# Patient Record
Sex: Male | Born: 1975 | Race: White | Hispanic: No | Marital: Single | Smoking: Former smoker
Health system: Southern US, Community
[De-identification: ages and names within clinical notes are randomized; demographics above are authoritative.]

## PROBLEM LIST (undated history)

## (undated) DIAGNOSIS — Z800 Family history of malignant neoplasm of digestive organs: Secondary | ICD-10-CM

## (undated) DIAGNOSIS — K219 Gastro-esophageal reflux disease without esophagitis: Secondary | ICD-10-CM

## (undated) DIAGNOSIS — E66811 Obesity, class 1: Secondary | ICD-10-CM

## (undated) DIAGNOSIS — C73 Malignant neoplasm of thyroid gland: Secondary | ICD-10-CM

## (undated) DIAGNOSIS — K760 Fatty (change of) liver, not elsewhere classified: Secondary | ICD-10-CM

## (undated) DIAGNOSIS — J189 Pneumonia, unspecified organism: Secondary | ICD-10-CM

## (undated) DIAGNOSIS — I10 Essential (primary) hypertension: Secondary | ICD-10-CM

## (undated) DIAGNOSIS — C8190 Hodgkin lymphoma, unspecified, unspecified site: Secondary | ICD-10-CM

## (undated) DIAGNOSIS — F32A Depression, unspecified: Secondary | ICD-10-CM

## (undated) DIAGNOSIS — Z8719 Personal history of other diseases of the digestive system: Secondary | ICD-10-CM

## (undated) DIAGNOSIS — R7303 Prediabetes: Secondary | ICD-10-CM

## (undated) DIAGNOSIS — E785 Hyperlipidemia, unspecified: Secondary | ICD-10-CM

## (undated) DIAGNOSIS — K759 Inflammatory liver disease, unspecified: Secondary | ICD-10-CM

## (undated) DIAGNOSIS — I509 Heart failure, unspecified: Secondary | ICD-10-CM

## (undated) DIAGNOSIS — F1910 Other psychoactive substance abuse, uncomplicated: Secondary | ICD-10-CM

## (undated) DIAGNOSIS — Z8619 Personal history of other infectious and parasitic diseases: Secondary | ICD-10-CM

## (undated) DIAGNOSIS — Z8489 Family history of other specified conditions: Secondary | ICD-10-CM

## (undated) DIAGNOSIS — E291 Testicular hypofunction: Secondary | ICD-10-CM

## (undated) DIAGNOSIS — J8410 Pulmonary fibrosis, unspecified: Secondary | ICD-10-CM

## (undated) DIAGNOSIS — E890 Postprocedural hypothyroidism: Secondary | ICD-10-CM

## (undated) DIAGNOSIS — M1990 Unspecified osteoarthritis, unspecified site: Secondary | ICD-10-CM

## (undated) DIAGNOSIS — R5382 Chronic fatigue, unspecified: Secondary | ICD-10-CM

## (undated) DIAGNOSIS — I639 Cerebral infarction, unspecified: Secondary | ICD-10-CM

## (undated) DIAGNOSIS — C4491 Basal cell carcinoma of skin, unspecified: Secondary | ICD-10-CM

## (undated) DIAGNOSIS — G4700 Insomnia, unspecified: Secondary | ICD-10-CM

## (undated) DIAGNOSIS — K921 Melena: Secondary | ICD-10-CM

## (undated) DIAGNOSIS — D649 Anemia, unspecified: Secondary | ICD-10-CM

## (undated) DIAGNOSIS — F419 Anxiety disorder, unspecified: Secondary | ICD-10-CM

## (undated) DIAGNOSIS — J4540 Moderate persistent asthma, uncomplicated: Secondary | ICD-10-CM

## (undated) DIAGNOSIS — G4730 Sleep apnea, unspecified: Secondary | ICD-10-CM

## (undated) DIAGNOSIS — Z8673 Personal history of transient ischemic attack (TIA), and cerebral infarction without residual deficits: Secondary | ICD-10-CM

## (undated) DIAGNOSIS — E669 Obesity, unspecified: Secondary | ICD-10-CM

## (undated) DIAGNOSIS — J45909 Unspecified asthma, uncomplicated: Secondary | ICD-10-CM

## (undated) DIAGNOSIS — K922 Gastrointestinal hemorrhage, unspecified: Secondary | ICD-10-CM

## (undated) DIAGNOSIS — I491 Atrial premature depolarization: Secondary | ICD-10-CM

## (undated) DIAGNOSIS — Z9081 Acquired absence of spleen: Secondary | ICD-10-CM

## (undated) DIAGNOSIS — F319 Bipolar disorder, unspecified: Secondary | ICD-10-CM

## (undated) DIAGNOSIS — Z8571 Personal history of Hodgkin lymphoma: Secondary | ICD-10-CM

## (undated) DIAGNOSIS — K2270 Barrett's esophagus without dysplasia: Secondary | ICD-10-CM

## (undated) HISTORY — DX: Prediabetes: R73.03

## (undated) HISTORY — DX: Obesity, class 1: E66.811

## (undated) HISTORY — DX: Melena: K92.1

## (undated) HISTORY — PX: OTHER SURGICAL HISTORY: SHX169

## (undated) HISTORY — PX: SPLENECTOMY: SUR1306

## (undated) HISTORY — DX: Gastro-esophageal reflux disease without esophagitis: K21.9

## (undated) HISTORY — DX: Personal history of other infectious and parasitic diseases: Z86.19

## (undated) HISTORY — DX: Fatty (change of) liver, not elsewhere classified: K76.0

## (undated) HISTORY — DX: Obesity, unspecified: E66.9

## (undated) HISTORY — DX: Family history of malignant neoplasm of digestive organs: Z80.0

## (undated) HISTORY — DX: Cerebral infarction, unspecified: I63.9

## (undated) HISTORY — PX: THYROIDECTOMY: SHX17

## (undated) HISTORY — DX: Testicular hypofunction: E29.1

## (undated) HISTORY — DX: Basal cell carcinoma of skin, unspecified: C44.91

## (undated) HISTORY — DX: Personal history of transient ischemic attack (TIA), and cerebral infarction without residual deficits: Z86.73

## (undated) HISTORY — DX: Moderate persistent asthma, uncomplicated: J45.40

## (undated) HISTORY — DX: Postprocedural hypothyroidism: E89.0

## (undated) HISTORY — DX: Hyperlipidemia, unspecified: E78.5

## (undated) HISTORY — DX: Chronic fatigue, unspecified: R53.82

## (undated) HISTORY — DX: Personal history of Hodgkin lymphoma: Z85.71

## (undated) HISTORY — DX: Insomnia, unspecified: G47.00

## (undated) HISTORY — DX: Acquired absence of spleen: Z90.81

## (undated) HISTORY — DX: Hodgkin lymphoma, unspecified, unspecified site: C81.90

## (undated) HISTORY — PX: PARATHYROIDECTOMY: SHX19

---

## 2008-10-11 DIAGNOSIS — C73 Malignant neoplasm of thyroid gland: Secondary | ICD-10-CM

## 2008-10-11 HISTORY — DX: Malignant neoplasm of thyroid gland: C73

## 2012-10-09 ENCOUNTER — Encounter (HOSPITAL_COMMUNITY): Payer: Self-pay | Admitting: *Deleted

## 2012-10-09 ENCOUNTER — Emergency Department (HOSPITAL_COMMUNITY)
Admission: EM | Admit: 2012-10-09 | Discharge: 2012-10-11 | Disposition: A | Discharge status: Home / Self Care | Payer: Self-pay | Attending: Emergency Medicine | Admitting: Emergency Medicine

## 2012-10-09 DIAGNOSIS — Z85850 Personal history of malignant neoplasm of thyroid: Secondary | ICD-10-CM | POA: Insufficient documentation

## 2012-10-09 DIAGNOSIS — K2270 Barrett's esophagus without dysplasia: Secondary | ICD-10-CM | POA: Insufficient documentation

## 2012-10-09 DIAGNOSIS — F1110 Opioid abuse, uncomplicated: Secondary | ICD-10-CM | POA: Insufficient documentation

## 2012-10-09 DIAGNOSIS — Z79899 Other long term (current) drug therapy: Secondary | ICD-10-CM | POA: Insufficient documentation

## 2012-10-09 DIAGNOSIS — F1010 Alcohol abuse, uncomplicated: Secondary | ICD-10-CM | POA: Insufficient documentation

## 2012-10-09 DIAGNOSIS — F17200 Nicotine dependence, unspecified, uncomplicated: Secondary | ICD-10-CM | POA: Insufficient documentation

## 2012-10-09 DIAGNOSIS — Z8709 Personal history of other diseases of the respiratory system: Secondary | ICD-10-CM | POA: Insufficient documentation

## 2012-10-09 DIAGNOSIS — I10 Essential (primary) hypertension: Secondary | ICD-10-CM | POA: Insufficient documentation

## 2012-10-09 DIAGNOSIS — J45909 Unspecified asthma, uncomplicated: Secondary | ICD-10-CM | POA: Insufficient documentation

## 2012-10-09 HISTORY — DX: Barrett's esophagus without dysplasia: K22.70

## 2012-10-09 HISTORY — DX: Pulmonary fibrosis, unspecified: J84.10

## 2012-10-09 HISTORY — DX: Other psychoactive substance abuse, uncomplicated: F19.10

## 2012-10-09 HISTORY — DX: Malignant neoplasm of thyroid gland: C73

## 2012-10-09 HISTORY — DX: Essential (primary) hypertension: I10

## 2012-10-09 HISTORY — DX: Gastrointestinal hemorrhage, unspecified: K92.2

## 2012-10-09 HISTORY — DX: Hodgkin lymphoma, unspecified, unspecified site: C81.90

## 2012-10-09 HISTORY — DX: Unspecified asthma, uncomplicated: J45.909

## 2012-10-09 LAB — CBC WITH DIFFERENTIAL/PLATELET
Basophils Absolute: 0.1 10*3/uL (ref 0.0–0.1)
HCT: 43.5 % (ref 39.0–52.0)
Lymphocytes Relative: 19 % (ref 12–46)
Monocytes Absolute: 0.9 10*3/uL (ref 0.1–1.0)
Neutro Abs: 4.8 10*3/uL (ref 1.7–7.7)
RBC: 5.18 MIL/uL (ref 4.22–5.81)
RDW: 17.1 % — ABNORMAL HIGH (ref 11.5–15.5)
WBC: 8.7 10*3/uL (ref 4.0–10.5)

## 2012-10-09 LAB — RAPID URINE DRUG SCREEN, HOSP PERFORMED
Barbiturates: NOT DETECTED
Tetrahydrocannabinol: POSITIVE — AB

## 2012-10-09 LAB — COMPREHENSIVE METABOLIC PANEL
ALT: 40 U/L (ref 0–53)
AST: 33 U/L (ref 0–37)
CO2: 24 mEq/L (ref 19–32)
Chloride: 98 mEq/L (ref 96–112)
Creatinine, Ser: 1.27 mg/dL (ref 0.50–1.35)
GFR calc non Af Amer: 72 mL/min — ABNORMAL LOW (ref >90)
Sodium: 136 mEq/L (ref 135–145)
Total Bilirubin: 0.4 mg/dL (ref 0.3–1.2)

## 2012-10-09 MED ORDER — ZOLPIDEM TARTRATE 10 MG PO TABS
10.0000 mg | ORAL_TABLET | Freq: Every evening | ORAL | Status: DC | PRN
  Administered 2012-10-10: 10 mg via ORAL
  Filled 2012-10-09: qty 1

## 2012-10-09 MED ORDER — MOMETASONE FURO-FORMOTEROL FUM 100-5 MCG/ACT IN AERO
2.0000 | INHALATION_SPRAY | Freq: Two times a day (BID) | RESPIRATORY_TRACT | Status: DC
  Administered 2012-10-10 – 2012-10-11 (×3): 2 via RESPIRATORY_TRACT
  Filled 2012-10-09: qty 8.8

## 2012-10-09 MED ORDER — BUPROPION HCL ER (XL) 300 MG PO TB24
300.0000 mg | ORAL_TABLET | Freq: Every day | ORAL | Status: DC
  Administered 2012-10-10 – 2012-10-11 (×2): 300 mg via ORAL
  Filled 2012-10-09 (×2): qty 1

## 2012-10-09 MED ORDER — LORAZEPAM 1 MG PO TABS
1.0000 mg | ORAL_TABLET | Freq: Three times a day (TID) | ORAL | Status: DC | PRN
  Administered 2012-10-10 – 2012-10-11 (×2): 1 mg via ORAL
  Filled 2012-10-09 (×2): qty 1

## 2012-10-09 MED ORDER — LISINOPRIL 20 MG PO TABS
20.0000 mg | ORAL_TABLET | Freq: Every day | ORAL | Status: DC
  Administered 2012-10-10 – 2012-10-11 (×2): 20 mg via ORAL
  Filled 2012-10-09 (×2): qty 1

## 2012-10-09 MED ORDER — PANTOPRAZOLE SODIUM 40 MG PO TBEC
40.0000 mg | DELAYED_RELEASE_TABLET | Freq: Every day | ORAL | Status: DC
  Administered 2012-10-10 – 2012-10-11 (×2): 40 mg via ORAL
  Filled 2012-10-09 (×2): qty 1

## 2012-10-09 MED ORDER — NICOTINE 21 MG/24HR TD PT24
21.0000 mg | MEDICATED_PATCH | Freq: Once | TRANSDERMAL | Status: AC
  Administered 2012-10-09: 21 mg via TRANSDERMAL
  Filled 2012-10-09 (×3): qty 1

## 2012-10-09 MED ORDER — LEVOTHYROXINE SODIUM 112 MCG PO TABS
224.0000 ug | ORAL_TABLET | Freq: Every day | ORAL | Status: DC
  Administered 2012-10-10 – 2012-10-11 (×2): 224 ug via ORAL
  Filled 2012-10-09 (×4): qty 2

## 2012-10-09 MED ORDER — ALBUTEROL SULFATE HFA 108 (90 BASE) MCG/ACT IN AERS: 2.0000 | INHALATION_SPRAY | Freq: Four times a day (QID) | RESPIRATORY_TRACT | Status: DC | PRN

## 2012-10-09 MED ORDER — FAMOTIDINE 20 MG PO TABS
10.0000 mg | ORAL_TABLET | Freq: Every day | ORAL | Status: DC
  Administered 2012-10-10: 10 mg via ORAL
  Administered 2012-10-11: 20 mg via ORAL
  Filled 2012-10-09: qty 1

## 2012-10-09 NOTE — ED Notes (Signed)
D: Patient with dull, flat affect endorsing depression. Pt states he is here for heroin detox. A: Monitor patient Q 15 minutes for safety, administer medications as ordered by MD. R: Pt denies SI or plans to harm himself at this time.

## 2012-10-09 NOTE — ED Notes (Signed)
Pt family will be taking belongings home.

## 2012-10-09 NOTE — ED Notes (Signed)
Pt was not able to urinate at this time 

## 2012-10-09 NOTE — ED Notes (Signed)
Pt requesting treatment for heroin and alcohol abuse; denies si/hi; last use 1630 today; no previous history of same; states has had suicidal thoughts at times but would never act on them; not actively suicidal at this time

## 2012-10-10 LAB — ACETAMINOPHEN LEVEL: Acetaminophen (Tylenol), Serum: <15 ug/mL (ref 10–30)

## 2012-10-10 MED ORDER — ONDANSETRON 4 MG PO TBDP
4.0000 mg | ORAL_TABLET | Freq: Three times a day (TID) | ORAL | Status: DC | PRN
  Administered 2012-10-10: 4 mg via ORAL
  Filled 2012-10-10: qty 1

## 2012-10-10 MED ORDER — NAPROXEN 500 MG PO TABS
500.0000 mg | ORAL_TABLET | Freq: Once | ORAL | Status: DC
  Filled 2012-10-10: qty 1

## 2012-10-10 MED ORDER — NICOTINE 21 MG/24HR TD PT24
21.0000 mg | MEDICATED_PATCH | Freq: Once | TRANSDERMAL | Status: DC
  Administered 2012-10-10: 21 mg via TRANSDERMAL

## 2012-10-10 MED ORDER — CLONIDINE HCL 0.1 MG PO TABS
0.2000 mg | ORAL_TABLET | Freq: Two times a day (BID) | ORAL | Status: DC
  Administered 2012-10-10 – 2012-10-11 (×2): 0.2 mg via ORAL
  Filled 2012-10-10 (×2): qty 2

## 2012-10-10 NOTE — ED Provider Notes (Signed)
History     CSN: [id number]  Arrival date & time 10/09/12  2024   First MD Initiated Contact with Patient 10/09/12 2217      No chief complaint on file.   (Consider location/radiation/quality/duration/timing/severity/associated sxs/prior treatment) HPI Comments: This is a 36 year old male, who presents emergency department with chief complaint of wanting to detox from heroin and alcohol. Patient denies SI/HI. States that the last time he used heroin was today at 4:30 PM. He does not have any complaints at this time. He has an extensive medical history which can be seen on the chart. Patient denies headache, blurred vision, new hearing loss, sore throat, chest pain, shortness of breath, nausea, vomiting, diarrhea, constipation, dysuria, peripheral edema, back pain, numbness or tingling of the extremities.   The history is provided by the patient. No language interpreter was used.    Past Medical History  Diagnosis Date  . Hodgkin disease   . Thyroid cancer   . Barrett's esophagus   . GI bleed   . Pulmonary fibrosis   . Hypertension   . Drug abuse   . Asthma     Past Surgical History  Procedure Date  . Spleenectomy   . Thyroidectomy   . Parathyroidectomy     No family history on file.  History  Substance Use Topics  . Smoking status: Current Some Day Smoker    Types: Cigarettes  . Smokeless tobacco: Not on file  . Alcohol Use: Yes      Review of Systems  All other systems reviewed and are negative.    Allergies  Review of patient's allergies indicates no known allergies.  Home Medications   Current Outpatient Rx  Name  Route  Sig  Dispense  Refill  . ALBUTEROL SULFATE HFA 108 (90 BASE) MCG/ACT IN AERS   Inhalation   Inhale 2 puffs into the lungs every 6 (six) hours as needed. For shortness of breath.         . BUPROPION HCL ER (XL) 300 MG PO TB24   Oral   Take 300 mg by mouth daily.         Marland Kitchen FLUTICASONE-SALMETEROL 250-50 MCG/DOSE IN AEPB  Inhalation   Inhale 1 puff into the lungs every 12 (twelve) hours.         Marland Kitchen LEVOTHYROXINE SODIUM 112 MCG PO TABS   Oral   Take 224 mcg by mouth daily.         Marland Kitchen LISINOPRIL 20 MG PO TABS   Oral   Take 20 mg by mouth daily.         Marland Kitchen MELATONIN PO   Oral   Take 1 capsule by mouth at bedtime.         Maxwell Caul BICARBONATE 20-1100 MG PO CAPS   Oral   Take 1 capsule by mouth daily before breakfast.         . RANITIDINE HCL 300 MG PO TABS   Oral   Take 300 mg by mouth at bedtime.         Marland Kitchen ZOLPIDEM TARTRATE 10 MG PO TABS   Oral   Take 10 mg by mouth at bedtime as needed. For sleep.           BP 132/76  Pulse 87  Temp 98.5 F (36.9 C) (Oral)  Resp 22  SpO2 98%  Physical Exam  Nursing note and vitals reviewed. Constitutional: He is oriented to person, place, and time. He appears well-developed and well-nourished.  HENT:  Head: Normocephalic and atraumatic.  Eyes: Conjunctivae normal and EOM are normal. Pupils are equal, round, and reactive to light.  Neck: Normal range of motion. Neck supple.  Cardiovascular: Normal rate, regular rhythm and intact distal pulses.  Exam reveals no gallop.   No murmur heard. Pulmonary/Chest: Effort normal and breath sounds normal. No respiratory distress. He has no wheezes. He has no rales. He exhibits no tenderness.  Abdominal: Soft. Bowel sounds are normal. He exhibits no distension and no mass. There is no tenderness. There is no rebound and no guarding.  Musculoskeletal: Normal range of motion. He exhibits no edema and no tenderness.  Neurological: He is alert and oriented to person, place, and time.  Skin: Skin is warm and dry. No rash noted.  Psychiatric: His behavior is normal. Judgment and thought content normal.       Somewhat withdrawn    ED Course  Procedures (including critical care time)  Labs Reviewed  CBC WITH DIFFERENTIAL - Abnormal; Notable for the following:    RDW 17.1 (*)     Eosinophils  Relative 14 (*)     Eosinophils Absolute 1.2 (*)     All other components within normal limits  COMPREHENSIVE METABOLIC PANEL - Abnormal; Notable for the following:    Glucose, Bld 108 (*)     GFR calc non Af Amer 72 (*)     GFR calc Af Amer 84 (*)     All other components within normal limits  URINE RAPID DRUG SCREEN (HOSP PERFORMED) - Abnormal; Notable for the following:    Opiates POSITIVE (*)     Tetrahydrocannabinol POSITIVE (*)     All other components within normal limits  ETHANOL  SALICYLATE LEVEL  ACETAMINOPHEN LEVEL   No results found.   No diagnosis found.    MDM  Patient is here for heroin and alcohol detox. I placed psych temp orders, and moved to the back.        Roxy Horseman, PA-C 10/10/12 [phone]

## 2012-10-10 NOTE — BHH Counselor (Signed)
Patient referred to [facility]. Per [place], beds are available.

## 2012-10-10 NOTE — BH Assessment (Signed)
Assessment Note   Troy [name] is an 36 y.o. male. Patient presents to the ED requesting detox from Alcohol, Heroin, Benzo's (Klonopin and Xanax), Cocaine, and THC. Patient's ET OH is negative and his BAL is positive for THC and opiates only. Patient using Alcohol daily for over a yr and drinks 3-4 beers to a fifth or more of liquor. He has used heroin daily for the past yr; $60-100 per use. He uses Benzo's "whenever accessible" which is typically 3-4x's per week; 1 gram per use. Patient using Cocaine 4-5x's per week over a quarter gram per use. He also smokes marijuana approx. 1 gram daily and has done so for several years. Patient last used all substances 10/10/2012 with the exception of cocaine and benzo's he used 2-3 days ago.  He reports several withdrawal symptoms: nausea, cold sweats, tremors, runny nose, cramps, etc. 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 Patient reports mild symptoms of depression feeling hopeless/helpless. Patient denies SI/HI/ AVH's. No history of inpatient or outpatient treatment.  He does not have any other complaints at this time.   Axis I: Polysubstance Abuse Axis II: Deferred Axis III:  Past Medical History  Diagnosis Date  . Hodgkin disease   . Thyroid cancer   . Barrett's esophagus   . GI bleed   . Pulmonary fibrosis   . Hypertension   . Drug abuse   . Asthma    Axis IV: housing problems, occupational problems, other psychosocial or environmental problems, problems related to social environment and problems with primary support group Axis V: 41-50 serious symptoms  Past Medical History:  Past Medical History  Diagnosis Date  . Hodgkin disease   . Thyroid cancer   . Barrett's esophagus   . GI bleed   . Pulmonary fibrosis   . Hypertension   . Drug abuse   . Asthma     Past Surgical History  Procedure Date   . Spleenectomy   . Thyroidectomy   . Parathyroidectomy     Family History: No family history on file.  Social History:  reports that he has been smoking Cigarettes.  He does not have any smokeless tobacco history on file. He reports that he drinks alcohol. He reports that he uses illicit drugs (Marijuana).  Additional Social History:  Alcohol / Drug Use Pain Medications: SEE MAR Prescriptions: SEE MAR Over the Counter: SEE MAR History of alcohol / drug use?: Yes Substance #1 Name of Substance 1: Alcohol  1 - Age of First Use: 36 yrs old 1 - Amount (size/oz): "varies"; "3-4 beers or over a 5th of liqour" 1 - Frequency: daily  1 - Duration: "A little over a year" 1 - Last Use / Amount: 10/09/12; "couple of years" Substance #2 Name of Substance 2: Heroin 2 - Age of First Use: "I can't remember" 2 - Amount (size/oz): $60-$100 worth of drugs 2 - Frequency: daily 2 - Duration: "couple of yrs" 2 - Last Use / Amount: 10/09/12; "I used alot...not sure exactly how much" Substance #3 Name of Substance 3: THC 3 - Age of First Use: 36 yrs old 3 - Amount (size/oz): 1 gram  or more 3 - Frequency: daily  3 - Duration: "yrs" 3 - Last Use / Amount: 10/09/12; unk amt Substance #4 Name of Substance 4: Cocaine 4 - Age of First Use: 36  yrs old 4 - Amount (size/oz): "A quarter of a gram" 4 - Frequency: daily  4 - Duration: on-going  4 - Last Use / Amount: "couple of days ago" Substance #5 Name of Substance 5: Benzo's (Xanax and Klonopin" 5 - Age of First Use: 36 yrs old 5 - Amount (size/oz): 1 gram per use 5 - Frequency: daily  5 - Duration: 1 yr 5 - Last Use / Amount: 2 days ago  CIWA: CIWA-Ar BP: 139/90 mmHg Pulse Rate: 86  COWS:    Allergies: No Known Allergies  Home Medications:  (Not in a hospital admission)  OB/GYN Status:  No LMP for male patient.  General Assessment Data Location of Assessment: [facility] Living Arrangements: Other (Comment);Parent (pt evicted from  parents home today) Can pt return to current living arrangement?: No Admission Status: Voluntary Is patient capable of signing voluntary admission?: Yes Transfer from: Acute Hospital Referral Source: Self/Family/Friend  Education Status Is patient currently in school?: No  Risk to self Suicidal Ideation: No Suicidal Intent: No Is patient at risk for suicide?: No Suicidal Plan?: No Access to Means: No What has been your use of drugs/alcohol within the last 12 months?:  (n/a) Previous Attempts/Gestures: Yes (1x (overdose)) How many times?:  (1 prior attempt during teens) Other Self Harm Risks:  (n/a) Triggers for Past Attempts:  ("pt unable to recall trigger") Intentional Self Injurious Behavior: None Family Suicide History: No Recent stressful life event(s): Other (Comment);Loss (Comment);Job Loss;Financial Problems (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 wks ago; evicted-homeless) Persecutory voices/beliefs?: No Depression: Yes Depression Symptoms: Feeling worthless/self pity Substance abuse history and/or treatment for substance abuse?: Yes Suicide prevention information given to non-admitted patients: Not applicable  Risk to Others Homicidal Ideation: No Thoughts of Harm to Others: No Current Homicidal Intent: No Current Homicidal Plan: No Access to Homicidal Means: No Identified Victim:  (n/a) History of harm to others?: No Assessment of Violence: None Noted Violent Behavior Description:  (pt is calm and cooperative during the assessment) Does patient have access to weapons?: No Criminal Charges Pending?: No Does patient have a court date: No  Psychosis Hallucinations: None noted Delusions: None noted  Mental Status Report Appear/Hygiene: Disheveled Eye Contact: Poor Motor Activity: Freedom of movement Speech: Logical/coherent Level of Consciousness: Alert Mood: Sad;Depressed Affect: Sad Anxiety Level: None Thought Processes: Coherent Judgement:  Unimpaired Orientation: Person;Place;Time;Situation Obsessive Compulsive Thoughts/Behaviors: None  Cognitive Functioning Concentration: Normal Memory: Remote Intact;Recent Intact IQ: Average Insight: Fair Impulse Control: Fair Appetite: Poor Weight Loss:  (35 pounds in 2-3 months) Weight Gain:  (none reported) Sleep: Decreased Total Hours of Sleep:  ("a few hours per night") Vegetative Symptoms: None  ADLScreening [facility] Assessment Services) Patient's cognitive ability adequate to safely complete daily activities?: Yes Patient able to express need for assistance with ADLs?: Yes Independently performs ADLs?: Yes (appropriate for developmental age)  Abuse/Neglect [facility]) Physical Abuse: Denies Verbal Abuse: Denies Sexual Abuse: Denies  Prior Inpatient Therapy Prior Inpatient Therapy: No Prior Therapy Dates:  (n/a) Prior Therapy Facilty/Provider(s):  (n/a) Reason for Treatment:  (n/a)  Prior Outpatient Therapy Prior Outpatient Therapy: No Prior Therapy Dates:  (n/a) Prior Therapy Facilty/Provider(s):  (n/a) Reason for Treatment:  (n/a)  ADL Screening (condition at time of admission) Patient's cognitive ability adequate to safely complete daily activities?: Yes Patient able to express need for assistance with ADLs?: Yes Independently performs ADLs?: Yes (appropriate for developmental age) Weakness of Legs: None Weakness of Arms/Hands: None  Home Assistive Devices/Equipment Home Assistive Devices/Equipment: None  Abuse/Neglect Assessment (Assessment to be complete while patient is alone) Physical Abuse: Denies Verbal Abuse: Denies Sexual Abuse: Denies Exploitation of patient/patient's resources: Denies Self-Neglect: Denies Values / Beliefs Cultural Requests During Hospitalization: None Spiritual Requests During Hospitalization: None   Advance Directives (For Healthcare) Advance Directive: Patient does not have advance directive Nutrition Screen- [facility]  Adult/[facility] Patient's home diet: Regular  Additional Information 1:1 In Past 12 Months?: No CIRT Risk: No Elopement Risk: No Does patient have medical clearance?: Yes     Disposition:  Disposition Disposition of Patient: Inpatient treatment program;Referred to ([facility]) Type of inpatient treatment program: Adult Patient referred to: [facility]  On Site Evaluation by:   Reviewed with Physician:     Melynda Ripple [facility] 10/10/2012 12:33 PM

## 2012-10-10 NOTE — ED Notes (Signed)
D: Patient asleep with no s/s of distress noted. A: Q 15 minute safety checks maintained. R: No change in patient's status at this time.

## 2012-10-10 NOTE — ED Provider Notes (Signed)
Medical screening examination/treatment/procedure(s) were performed by non-physician practitioner and as supervising physician I was immediately available for consultation/collaboration.   Gerhard Munch, MD 10/10/12 1700

## 2012-10-10 NOTE — ED Notes (Addendum)
Pt is unhappy with placement in the [facility] area and states that if he doesn't get moved tomorrow, then he will leave.  Pt also requesting ambien.

## 2012-10-11 NOTE — ED Provider Notes (Signed)
Patient doesn't want to go to detox center. He has been here for 45 hrs. Feels better now. Vitals stable. Will give him a list of detox places and have him f/u outpatient.   Richardean Canal, MD 10/11/12 [phone]

## 2013-12-10 ENCOUNTER — Emergency Department: Payer: Self-pay | Admitting: Emergency Medicine

## 2013-12-10 LAB — COMPREHENSIVE METABOLIC PANEL
ALK PHOS: 47 U/L
ALT: 32 U/L (ref 12–78)
Albumin: 3.5 g/dL (ref 3.4–5.0)
Anion Gap: 4 — ABNORMAL LOW (ref 7–16)
BUN: 15 mg/dL (ref 7–18)
Bilirubin,Total: 0.3 mg/dL (ref 0.2–1.0)
Calcium, Total: 8.2 mg/dL — ABNORMAL LOW (ref 8.5–10.1)
Chloride: 104 mmol/L (ref 98–107)
Co2: 26 mmol/L (ref 21–32)
Creatinine: 1.14 mg/dL (ref 0.60–1.30)
GLUCOSE: 86 mg/dL (ref 65–99)
Osmolality: 268 (ref 275–301)
Potassium: 4.5 mmol/L (ref 3.5–5.1)
SGOT(AST): 30 U/L (ref 15–37)
SODIUM: 134 mmol/L — AB (ref 136–145)
Total Protein: 7.8 g/dL (ref 6.4–8.2)

## 2013-12-10 LAB — URINALYSIS, COMPLETE
BILIRUBIN, UR: NEGATIVE
Bacteria: NONE SEEN
Blood: NEGATIVE
GLUCOSE, UR: NEGATIVE mg/dL (ref 0–75)
Leukocyte Esterase: NEGATIVE
Nitrite: NEGATIVE
PROTEIN: NEGATIVE
Ph: 7 (ref 4.5–8.0)
RBC,UR: 1 /HPF (ref 0–5)
Specific Gravity: 1.016 (ref 1.003–1.030)
Squamous Epithelial: NONE SEEN
WBC UR: 1 /HPF (ref 0–5)

## 2013-12-10 LAB — DRUG SCREEN, URINE
Amphetamines, Ur Screen: NEGATIVE (ref ?–1000)
Barbiturates, Ur Screen: NEGATIVE (ref ?–200)
Benzodiazepine, Ur Scrn: NEGATIVE (ref ?–200)
Cannabinoid 50 Ng, Ur ~~LOC~~: NEGATIVE (ref ?–50)
Cocaine Metabolite,Ur ~~LOC~~: NEGATIVE (ref ?–300)
MDMA (Ecstasy)Ur Screen: POSITIVE (ref ?–500)
Methadone, Ur Screen: NEGATIVE (ref ?–300)
Opiate, Ur Screen: NEGATIVE (ref ?–300)
Phencyclidine (PCP) Ur S: NEGATIVE (ref ?–25)
Tricyclic, Ur Screen: NEGATIVE (ref ?–1000)

## 2013-12-10 LAB — SALICYLATE LEVEL

## 2013-12-10 LAB — ETHANOL: Ethanol: <3 mg/dL

## 2013-12-10 LAB — ACETAMINOPHEN LEVEL

## 2013-12-11 LAB — CBC
HCT: 45.4 % (ref 40.0–52.0)
HGB: 15 g/dL (ref 13.0–18.0)
MCH: 30.8 pg (ref 26.0–34.0)
MCHC: 33 g/dL (ref 32.0–36.0)
MCV: 93 fL (ref 80–100)
Platelet: 211 10*3/uL (ref 150–440)
RBC: 4.88 10*6/uL (ref 4.40–5.90)
RDW: 16 % — AB (ref 11.5–14.5)
WBC: 8.6 10*3/uL (ref 3.8–10.6)

## 2014-08-15 ENCOUNTER — Emergency Department: Payer: Self-pay | Admitting: Emergency Medicine

## 2015-02-01 NOTE — Consult Note (Signed)
PATIENT NAME:  Troy [name], Troy [name] MR#:  [id number] DATE OF BIRTH:  01/11/1976  DATE OF CONSULTATION:  12/11/2013  REFERRING PHYSICIAN:  Cecille Amsterdam. Mayford Knife, MD CONSULTING PHYSICIAN:  Ardeen Fillers. Garnetta Buddy, MD  REASON FOR CONSULTATION: "I guess I was hallucinating yesterday. I'm okay now."   HISTORY OF PRESENT ILLNESS: The patient is a 39 year old white male who presented to the ER last night, as IVC was taken out by the family members with whom he was living with at this time. The patient reported that he might be acting out in a strange manner and he might be hallucinating.   During my interview, the patient was lying in the bed. He kept his head under the covers. He reported that he currently lives with a married couple and their children. He stated that he was taking a nap on the couch, and then the police came and brought him here. He stated that he was not doing anything, but the people at the house thought that he might be acting funny. He usually stays with a friend. He stated that he lives with a married couple and their 2 kids who are 6 and 38 years old. He stated that they did not tell him earlier, and they took out the IVC on him. The patient reported that he follows with a psychiatrist at [facility] in [place] and has been taking medications prescribed by them. However, he usually feels angry and feels sleepy most of the time. He reported that the medication makes him very tired and he is unable to do anything. He reported that he is taking a combination of medications. He was irate during the interview and did not want to tell and answer most of the questions. However, when I told him that he needs to be more receptive of the questions, he calmed down. He also admits to using cocaine recently. He reported that he drank 6 beers in the past 2 weeks. He also admits to using heroin 3 weeks ago. The patient reported that he has been diagnosed with bipolar disorder in the past. He stated that he has some  irritability, but he is able to control his anger most of the time. He currently denies having any suicidal or homicidal ideations or plans.   PAST PSYCHIATRIC HISTORY: The patient reported that he has been diagnosed with bipolar disorder, and his last hospitalization was in August when he was admitted to the [facility]. He is currently followed by [facility] in [place]. He usually sees Jerrel Ivory through [facility]. He is currently a combination of Wellbutrin, buspirone, Depakote, and trazodone. He stated that he sleeps a lot. He also gets his thyroid medication from Nathen May at [facility], as he has history of thyroid cancer.   FAMILY HISTORY: The patient reported that his whole family has history of bipolar disorder. They live in [place] and [place].  SOCIAL HISTORY: The patient stated that he used to work as an Charity fundraiser at [employer], but he lost his license as he started using drugs, including heroin. He stated that he is currently estranged from his family members, and they have disowned him and he is not allowed to talk to them at this time. He is currently living with a married couple.   SUBSTANCE ABUSE HISTORY: The patient has a long history of using drugs, including heroin, cocaine, and alcohol in the past. However, he stated that he is currently not using them on a heavy basis.   ALLERGIES: No  known drug allergies.   CURRENT MEDICATIONS: Depakote ER 1250 mg at bedtime, trazodone 400 mg at bedtime, buspirone 15 mg b.i.d., Wellbutrin XL 300 mg in the morning.   REVIEW OF SYSTEMS:    CONSTITUTIONAL: Denies any fever or chills. No weight changes.  EYES: No double or blurred vision.  RESPIRATORY: No shortness of breath or cough.  CARDIOVASCULAR: No chest pain or orthopnea.  GASTROINTESTINAL: No abdominal pain, nausea, vomiting or diarrhea.  GENITOURINARY: No incontinence or frequency.  ENDOCRINE: No heat or cold intolerance.  LYMPHATIC: No anemia or  easy bruising.  INTEGUMENTARY: No acne or rash.  MUSCULOSKELETAL: No muscle or joint pain.  NEUROLOGIC: No tingling or weakness.   VITAL SIGNS: Temperature 97.8, pulse 67, respirations 16, blood pressure 149/86.  LABORATORY DATA: Glucose 86, BUN 15, creatinine 1.14, sodium 134, potassium 4.5, chloride 104, bicarbonate 26, anion gap 4, osmolality 268, calcium 8.2. Blood alcohol level less than 3. Protein 7.8, albumin 3.5, bilirubin 0.3, alkaline phosphatase 47, AST 30, ALT 32. UDS was positive for MDMA. WBC 8.6, RBC 4.8, hemoglobin 15, hematocrit 45.4; platelet count is 211; MCV is 93; RDW is 16.   MENTAL STATUS EXAMINATION: The patient is a moderately built male who was lying in bed. He maintained poor eye contact. His speech was low in tone and volume. Mood was depressed and anxious. Affect was congruent. Thought process was circumstantial. Thought content was nondelusional. He currently denied having any suicidal ideations or plans. His language was intact. Fund of knowledge appears appropriate. Demonstrated fair insight and judgment. No perceptual disturbances were noted. Memory was intact as well.   DIAGNOSTIC IMPRESSION: AXIS I: 1.  Bipolar disorder, not otherwise specified.  2.  Polysubstance dependence.  AXIS II: None.  AXIS III: Please review the medical history.   TREATMENT PLAN: 1.  I discussed with the patient at length about the medications, treatment, risks, benefits and alternatives.  2.  He is currently following with [facility] in [place]. I advised him that his medications need to be adjusted, including decreasing the dose of Depakote to 1000 mg and trazodone to 100 mg. He demonstrated understanding. The patient currently does not have any thoughts to harm himself. He will follow up with [facility] as an outpatient. The patient will be discharged from the ED at this time.   Thank you for allowing me to participate in the care of this patient.   ____________________________ Ardeen Fillers. Garnetta Buddy, MD usf:jcm D: 12/11/2013 13:26:15 ET T: 12/11/2013 14:49:39 ET JOB#: [id number]  cc: Ardeen Fillers. Garnetta Buddy, MD, <Dictator> Rhunette Croft MD ELECTRONICALLY SIGNED 12/13/2013 10:37

## 2015-07-16 ENCOUNTER — Emergency Department (HOSPITAL_COMMUNITY): Payer: No Typology Code available for payment source

## 2015-07-16 ENCOUNTER — Encounter (HOSPITAL_COMMUNITY): Payer: Self-pay | Admitting: Emergency Medicine

## 2015-07-16 ENCOUNTER — Inpatient Hospital Stay (HOSPITAL_COMMUNITY)
Admission: EM | Admit: 2015-07-16 | Discharge: 2015-07-18 | DRG: 065 | Disposition: A | Discharge status: Home / Self Care | Payer: No Typology Code available for payment source | Attending: Internal Medicine | Admitting: Internal Medicine

## 2015-07-16 DIAGNOSIS — Z85850 Personal history of malignant neoplasm of thyroid: Secondary | ICD-10-CM

## 2015-07-16 DIAGNOSIS — I639 Cerebral infarction, unspecified: Principal | ICD-10-CM | POA: Diagnosis present

## 2015-07-16 DIAGNOSIS — M6282 Rhabdomyolysis: Secondary | ICD-10-CM | POA: Diagnosis present

## 2015-07-16 DIAGNOSIS — Z8572 Personal history of non-Hodgkin lymphomas: Secondary | ICD-10-CM

## 2015-07-16 DIAGNOSIS — J45909 Unspecified asthma, uncomplicated: Secondary | ICD-10-CM | POA: Diagnosis present

## 2015-07-16 DIAGNOSIS — Z9119 Patient's noncompliance with other medical treatment and regimen: Secondary | ICD-10-CM

## 2015-07-16 DIAGNOSIS — N179 Acute kidney failure, unspecified: Secondary | ICD-10-CM | POA: Diagnosis present

## 2015-07-16 DIAGNOSIS — Z888 Allergy status to other drugs, medicaments and biological substances status: Secondary | ICD-10-CM

## 2015-07-16 DIAGNOSIS — E785 Hyperlipidemia, unspecified: Secondary | ICD-10-CM | POA: Diagnosis present

## 2015-07-16 DIAGNOSIS — F1994 Other psychoactive substance use, unspecified with psychoactive substance-induced mood disorder: Secondary | ICD-10-CM | POA: Insufficient documentation

## 2015-07-16 DIAGNOSIS — I10 Essential (primary) hypertension: Secondary | ICD-10-CM | POA: Diagnosis present

## 2015-07-16 DIAGNOSIS — Z79899 Other long term (current) drug therapy: Secondary | ICD-10-CM

## 2015-07-16 DIAGNOSIS — Z881 Allergy status to other antibiotic agents status: Secondary | ICD-10-CM

## 2015-07-16 DIAGNOSIS — E890 Postprocedural hypothyroidism: Secondary | ICD-10-CM | POA: Diagnosis present

## 2015-07-16 DIAGNOSIS — F319 Bipolar disorder, unspecified: Secondary | ICD-10-CM | POA: Diagnosis present

## 2015-07-16 DIAGNOSIS — Z8673 Personal history of transient ischemic attack (TIA), and cerebral infarction without residual deficits: Secondary | ICD-10-CM | POA: Insufficient documentation

## 2015-07-16 DIAGNOSIS — F17210 Nicotine dependence, cigarettes, uncomplicated: Secondary | ICD-10-CM | POA: Diagnosis present

## 2015-07-16 MED ORDER — SODIUM CHLORIDE 0.9 % IV BOLUS (SEPSIS)
2000.0000 mL | Freq: Once | INTRAVENOUS | Status: AC
  Administered 2015-07-17: 2000 mL via INTRAVENOUS

## 2015-07-16 NOTE — ED Provider Notes (Signed)
CSN: [id number]   Arrival date & time 07/16/15 2329  History  By signing my name below, I, Altamease Oiler, attest that this documentation has been prepared under the direction and in the presence of Everlene Balls, MD. Electronically Signed: Altamease Oiler, ED Scribe. 07/17/2015. 12:54 AM.  Chief Complaint  Patient presents with  . Stroke Symptoms  . Aphasia  . Weakness    RIGHT sided    HPI The history is provided by the patient. No language interpreter was used.   Troy [name] is a 39 y.o. male with PMHx of Hodgkin's disease, thyroid cancer s/p thyroidectomy, HTN, and drug abuse who presents to the Emergency Department complaining of aphasia with sudden onset around 9:30 PM in the parking lot of his gym. Pt states that he "cannot get words out". Associated symptoms include right-sided weakness and right-sided numbness. He states that his right arm feels like "someone else's arm".  Pt denies any pain. No recent illness. He had an [place] this morning at [facility] to f/u on his thyroid cancer. No recent change in his levothyroxine. Admits to alcohol use this morning.   Past Medical History  Diagnosis Date  . Hodgkin disease ([place])   . Thyroid cancer ([place])   . Barrett's esophagus   . GI bleed   . Pulmonary fibrosis ([place])   . Hypertension   . Drug abuse   . Asthma     Past Surgical History  Procedure Laterality Date  . Spleenectomy    . Thyroidectomy    . Parathyroidectomy      History reviewed. No pertinent family history.  Social History  Substance Use Topics  . Smoking status: Current Some Day Smoker    Types: Cigarettes  . Smokeless tobacco: None  . Alcohol Use: Yes     Review of Systems 10 Systems reviewed and all are negative for acute change except as noted in the HPI. Home Medications   Prior to Admission medications   Medication Sig Start Date End Date Taking? Authorizing Provider  albuterol (PROVENTIL HFA;VENTOLIN HFA) 108 (90 BASE) MCG/ACT inhaler Inhale 2 puffs  into the lungs every 6 (six) hours as needed. For shortness of breath.   Yes Historical Provider, MD  levothyroxine (SYNTHROID, LEVOTHROID) 112 MCG tablet Take 224 mcg by mouth daily.   Yes Historical Provider, MD  Omeprazole-Sodium Bicarbonate (ZEGERID) 20-1100 MG CAPS Take 1 capsule by mouth daily before breakfast.   Yes Historical Provider, MD  Fluticasone-Salmeterol (ADVAIR) 250-50 MCG/DOSE AEPB Inhale 1 puff into the lungs every 12 (twelve) hours.    Historical Provider, MD    Allergies  Ciprofloxacin and Sage  Triage Vitals: BP 142/75 mmHg  Pulse 123  Temp(Src) 98.5 F (36.9 C) (Oral)  Resp 12  SpO2 99%  Physical Exam  Constitutional: He is oriented to person, place, and time. He appears well-developed and well-nourished.  Non-toxic appearance. He does not appear ill. No distress.  Slow to respond to questioning  HENT:  Head: Normocephalic and atraumatic.  Nose: Nose normal.  Mouth/Throat: Oropharynx is clear and moist. No oropharyngeal exudate.  Eyes: Conjunctivae and EOM are normal. Pupils are equal, round, and reactive to light. No scleral icterus.  Neck: Normal range of motion. Neck supple. No tracheal deviation, no edema, no erythema and normal range of motion present. No thyroid mass and no thyromegaly present.  Cardiovascular: Regular rhythm, S1 normal, S2 normal, normal heart sounds, intact distal pulses and normal pulses.  Tachycardia present.  Exam reveals no gallop and no  friction rub.   No murmur heard. Pulses:      Radial pulses are 2+ on the right side, and 2+ on the left side.       Dorsalis pedis pulses are 2+ on the right side, and 2+ on the left side.  Pulmonary/Chest: Effort normal and breath sounds normal. No respiratory distress. He has no wheezes. He has no rhonchi. He has no rales.  Abdominal: Soft. Normal appearance and bowel sounds are normal. He exhibits no distension, no ascites and no mass. There is no hepatosplenomegaly. There is no tenderness. There  is no rebound, no guarding and no CVA tenderness.  Musculoskeletal: Normal range of motion. He exhibits no edema or tenderness.  Lymphadenopathy:    He has no cervical adenopathy.  Neurological: He is alert and oriented to person, place, and time. He has normal strength. No cranial nerve deficit or sensory deficit.  Right sided facial droop Decreased strength in RUE Normal strength in LUE and LLE Normal finger to nose exam  Skin: Skin is warm, dry and intact. No petechiae and no rash noted. He is not diaphoretic. No erythema. No pallor.  Psychiatric: He has a normal mood and affect. His behavior is normal. Judgment normal.  Nursing note and vitals reviewed.   ED Course  Procedures   DIAGNOSTIC STUDIES: Oxygen Saturation is 99% on RA, normal by my interpretation.    COORDINATION OF CARE: 11:44 PM Discussed treatment plan which includes lab work, CT head, CXR, EKG with pt at bedside and pt agreed to plan.  1:15 AM D/w Dr. Alcario Drought (Hospitalist), in the department, who will discuss with neurology and get back to me.     Labs Reviewed  DIFFERENTIAL - Abnormal; Notable for the following:    Neutro Abs 8.7 (*)    Monocytes Absolute 1.5 (*)    All other components within normal limits  COMPREHENSIVE METABOLIC PANEL - Abnormal; Notable for the following:    Sodium 134 (*)    Chloride 100 (*)    CO2 21 (*)    Glucose, Bld 156 (*)    Creatinine, Ser 1.63 (*)    AST 75 (*)    ALT 109 (*)    GFR calc non Af Amer 52 (*)    All other components within normal limits  URINE RAPID DRUG SCREEN, HOSP PERFORMED - Abnormal; Notable for the following:    Tetrahydrocannabinol POSITIVE (*)    All other components within normal limits  URINALYSIS, ROUTINE W REFLEX MICROSCOPIC (NOT AT [facility]) - Abnormal; Notable for the following:    APPearance CLOUDY (*)    Glucose, UA 100 (*)    Hgb urine dipstick MODERATE (*)    Ketones, ur 15 (*)    Protein, ur 100 (*)    All other components within normal  limits  CK - Abnormal; Notable for the following:    Total CK 1153 (*)    All other components within normal limits  TSH - Abnormal; Notable for the following:    TSH 6.798 (*)    All other components within normal limits  LIPID PANEL - Abnormal; Notable for the following:    Cholesterol 210 (*)    HDL 36 (*)    LDL Cholesterol 145 (*)    All other components within normal limits  URINE MICROSCOPIC-ADD ON - Abnormal; Notable for the following:    Casts HYALINE CASTS (*)    All other components within normal limits  CK TOTAL AND CKMB (NOT AT  [facility]) - Abnormal; Notable for the following:    Total CK 905 (*)    CK, MB 6.3 (*)    All other components within normal limits  I-STAT CHEM 8, ED - Abnormal; Notable for the following:    Creatinine, Ser 1.60 (*)    Glucose, Bld 166 (*)    Calcium, Ion 0.98 (*)    Hemoglobin 17.3 (*)    All other components within normal limits  PROTIME-INR  APTT  ETHANOL  T4, FREE  HEMOGLOBIN A1C  I-STAT TROPOININ, ED  CBG MONITORING, ED  I-STAT CHEM 8, ED  I-STAT TROPOININ, ED    Imaging Review Dg Chest 2 View  07/17/2015   CLINICAL DATA:  Tachycardia and right-sided weakness, onset tonight.  EXAM: CHEST  2 VIEW  COMPARISON:  None.  FINDINGS: Minimal linear scarring or atelectasis is present in the left base. The lungs are otherwise clear. There is no pleural effusion. The pulmonary vasculature is normal. Hilar and mediastinal contours are unremarkable.  IMPRESSION: Minimal curvilinear scarring or atelectasis in the left base.   Electronically Signed   By: Andreas Newport M.D.   On: 07/17/2015 00:40   Ct Head Wo Contrast  07/17/2015   CLINICAL DATA:  Acute onset of right-sided weakness and right-sided facial droop. Slurred speech. Code stroke. Initial encounter.  EXAM: CT HEAD WITHOUT CONTRAST  TECHNIQUE: Contiguous axial images were obtained from the base of the skull through the vertex without intravenous contrast.  COMPARISON:  CT of the head  performed 08/15/2014  FINDINGS: There is no evidence of acute infarction, mass lesion, or intra- or extra-axial hemorrhage on CT.  The posterior fossa, including the cerebellum, brainstem and fourth ventricle, is within normal limits. The third and lateral ventricles, and basal ganglia are unremarkable in appearance. The cerebral hemispheres are symmetric in appearance, with normal gray-white differentiation. No mass effect or midline shift is seen.  There is no evidence of fracture; visualized osseous structures are unremarkable in appearance. The orbits are within normal limits. The paranasal sinuses and mastoid air cells are well-aerated. No significant soft tissue abnormalities are seen.  IMPRESSION: Unremarkable noncontrast CT of the head.  These results were called by telephone at the time of interpretation on 07/17/2015 at 12:01 am to Dr. Nicole Kindred, who verbally acknowledged these results.   Electronically Signed   By: Garald Balding M.D.   On: 07/17/2015 00:03   Mr Brain Wo Contrast  07/17/2015   CLINICAL DATA:  The initial evaluation for sudden onset aphasia with associated right-sided weakness and numbness.  EXAM: MRI HEAD WITHOUT CONTRAST  TECHNIQUE: Multiplanar, multiecho pulse sequences of the brain and surrounding structures were obtained without intravenous contrast.  COMPARISON:  Prior CT from 07/16/2015.  FINDINGS: On axial DWI sequence, there is a subtle 5 mm focus of high signal intensity within the cortical gray matter of the right frontal lobe (series 4, image 41). No definite ADC correlate, although this focus is so small that this is felt to likely be difficult to discern on corresponding ADC map. Additionally, there is a subtle 9 mm focus of high signal intensity within the left parietal lobe on axial DWI sequence 38 additional possible punctate focus of restricted diffusion more cephalad within the left parietal lobe on axial image 44 no definite T2/FLAIR correlate seen about these lesions.  These are favored to be artifactual in nature, although possible tiny foci of acute ischemia could be considered in the correct clinical setting. No other foci of restricted diffusion  identified. Normal intravascular flow voids maintained. No acute or chronic intracranial hemorrhage.  Cerebral volume within normal limits for patient age. No significant white matter disease.  No mass lesion, midline shift, or mass effect. No hydrocephalus. No extra-axial fluid collection. Probable small amount of trapped fluid within the right petrous apex.  Craniocervical junction within normal limits. Incidental note made of a partially empty sella.  No acute abnormality about the orbits.  Mild mucosal thickening within the paranasal sinuses. No mastoid effusion. Inner ear structures normal.  Bone marrow signal intensity within normal limits. Scalp soft tissues unremarkable.  IMPRESSION: 1. Few tiny punctate foci of high signal intensity on DWI sequence within the cortical gray matter of the right frontal lobe and left parietal lobe as detailed above. These are favored to be artifactual in nature, although possible tiny acute ischemic infarcts are not entirely excluded, and could be considered if there is strong clinical suspicion for acute ischemia. While no definite ADC correlate seen with these foci, these are felt to be too small to resolve on corresponding ADC map. 2. Otherwise normal brain MRI.   Electronically Signed   By: Jeannine Boga M.D.   On: 07/17/2015 02:21   I personally reviewed and evaluated these images and lab results as a part of my medical decision-making.    EKG Interpretation  Date/Time:  Wednesday July 16 2015 23:30:00 EDT Ventricular Rate:  121 PR Interval:  142 QRS Duration: 86 QT Interval:  307 QTC Calculation: 435 R Axis:   68 Text Interpretation:  Sinus tachycardia Consider left ventricular hypertrophy Borderline T abnormalities, lateral leads ST elevation, consider anterior  injury No old tracing to compare Confirmed by Glynn Octave [phone]) on 07/16/2015 11:53:03 PM    MDM   Final diagnoses:  None    Patient presents to the ED for stroke like symptoms.  He states this occurred with sudden onset at 930pm.  His physical exam reveals some inconsistencies with what he states are his deficiets.  He can not pull my arm, but can do finger to nose on the right side.  In addition, his facial droop is not always present.  Due to the severity of possible outcome however, code stroke was called.  Dr. Nicole Kindred evaluated the patient and recommends for admission and further work up.  CT head is negative.  CK is elevated over 1100. Patient is receiving IVF.  MRI reveals possible stroke vs artifact, I spoke with Dr. Nicole Kindred who requests for the patient to be admitted and receive a full stroke evaluation.  Is poke with Dr. Alcario Drought with [facility] who will admit he patient to tele for further care.  He continues to have the same symptoms in the room.   I, Jiovani Mccammon, personally performed the services described in this documentation. All medical record entries made by the scribe were at my direction and in my presence.  I have reviewed the chart and discharge instructions and agree that the record reflects my personal performance and is accurate and complete. Herbert Marken.  07/17/2015. 1:05 AM.      Everlene Balls, MD 07/17/15 [phone]

## 2015-07-16 NOTE — ED Notes (Signed)
Pt presents via [facility] for stroke like symptoms that the patient reports started at 2230 tonight; pt has slurred speech and RIGHTsided weakness; pt denies drug use or medical hx; CBG WNL; pt initially hypertensive; pt reports having trouble getting thoughts/words out

## 2015-07-17 ENCOUNTER — Emergency Department (HOSPITAL_COMMUNITY): Payer: No Typology Code available for payment source

## 2015-07-17 ENCOUNTER — Inpatient Hospital Stay (HOSPITAL_COMMUNITY): Payer: No Typology Code available for payment source

## 2015-07-17 DIAGNOSIS — M6282 Rhabdomyolysis: Secondary | ICD-10-CM | POA: Diagnosis present

## 2015-07-17 DIAGNOSIS — I639 Cerebral infarction, unspecified: Secondary | ICD-10-CM | POA: Diagnosis present

## 2015-07-17 DIAGNOSIS — N179 Acute kidney failure, unspecified: Secondary | ICD-10-CM | POA: Diagnosis present

## 2015-07-17 DIAGNOSIS — F1994 Other psychoactive substance use, unspecified with psychoactive substance-induced mood disorder: Secondary | ICD-10-CM | POA: Diagnosis not present

## 2015-07-17 DIAGNOSIS — Z85850 Personal history of malignant neoplasm of thyroid: Secondary | ICD-10-CM | POA: Diagnosis not present

## 2015-07-17 DIAGNOSIS — F319 Bipolar disorder, unspecified: Secondary | ICD-10-CM | POA: Diagnosis present

## 2015-07-17 DIAGNOSIS — F17210 Nicotine dependence, cigarettes, uncomplicated: Secondary | ICD-10-CM | POA: Diagnosis present

## 2015-07-17 DIAGNOSIS — Z888 Allergy status to other drugs, medicaments and biological substances status: Secondary | ICD-10-CM | POA: Diagnosis not present

## 2015-07-17 DIAGNOSIS — Z8572 Personal history of non-Hodgkin lymphomas: Secondary | ICD-10-CM | POA: Diagnosis not present

## 2015-07-17 DIAGNOSIS — F4320 Adjustment disorder, unspecified: Secondary | ICD-10-CM

## 2015-07-17 DIAGNOSIS — Z9119 Patient's noncompliance with other medical treatment and regimen: Secondary | ICD-10-CM | POA: Diagnosis not present

## 2015-07-17 DIAGNOSIS — I6349 Cerebral infarction due to embolism of other cerebral artery: Secondary | ICD-10-CM | POA: Diagnosis not present

## 2015-07-17 DIAGNOSIS — I63039 Cerebral infarction due to thrombosis of unspecified carotid artery: Secondary | ICD-10-CM | POA: Diagnosis not present

## 2015-07-17 DIAGNOSIS — I10 Essential (primary) hypertension: Secondary | ICD-10-CM

## 2015-07-17 DIAGNOSIS — E039 Hypothyroidism, unspecified: Secondary | ICD-10-CM | POA: Diagnosis not present

## 2015-07-17 DIAGNOSIS — Z8673 Personal history of transient ischemic attack (TIA), and cerebral infarction without residual deficits: Secondary | ICD-10-CM | POA: Insufficient documentation

## 2015-07-17 DIAGNOSIS — Z79899 Other long term (current) drug therapy: Secondary | ICD-10-CM | POA: Diagnosis not present

## 2015-07-17 DIAGNOSIS — I6789 Other cerebrovascular disease: Secondary | ICD-10-CM

## 2015-07-17 DIAGNOSIS — E890 Postprocedural hypothyroidism: Secondary | ICD-10-CM | POA: Diagnosis present

## 2015-07-17 DIAGNOSIS — I350 Nonrheumatic aortic (valve) stenosis: Secondary | ICD-10-CM | POA: Diagnosis not present

## 2015-07-17 DIAGNOSIS — Z881 Allergy status to other antibiotic agents status: Secondary | ICD-10-CM | POA: Diagnosis not present

## 2015-07-17 DIAGNOSIS — E785 Hyperlipidemia, unspecified: Secondary | ICD-10-CM | POA: Diagnosis present

## 2015-07-17 DIAGNOSIS — J45909 Unspecified asthma, uncomplicated: Secondary | ICD-10-CM | POA: Diagnosis present

## 2015-07-17 LAB — RAPID URINE DRUG SCREEN, HOSP PERFORMED
Amphetamines: NOT DETECTED
BARBITURATES: NOT DETECTED
Benzodiazepines: NOT DETECTED
COCAINE: NOT DETECTED
Opiates: NOT DETECTED
TETRAHYDROCANNABINOL: POSITIVE — AB

## 2015-07-17 LAB — I-STAT CHEM 8, ED
BUN: 12 mg/dL (ref 6–20)
CALCIUM ION: 0.98 mmol/L — AB (ref 1.12–1.23)
Chloride: 105 mmol/L (ref 101–111)
Creatinine, Ser: 1.6 mg/dL — ABNORMAL HIGH (ref 0.61–1.24)
GLUCOSE: 166 mg/dL — AB (ref 65–99)
HCT: 51 % (ref 39.0–52.0)
HEMOGLOBIN: 17.3 g/dL — AB (ref 13.0–17.0)
POTASSIUM: 4.1 mmol/L (ref 3.5–5.1)
Sodium: 137 mmol/L (ref 135–145)
TCO2: 21 mmol/L (ref 0–100)

## 2015-07-17 LAB — DIFFERENTIAL
BASOS PCT: 0 %
Basophils Absolute: 0 10*3/uL (ref 0.0–0.1)
Eosinophils Absolute: 0 10*3/uL (ref 0.0–0.7)
Eosinophils Relative: 0 %
Lymphocytes Relative: 13 %
Lymphs Abs: 1.5 10*3/uL (ref 0.7–4.0)
MONO ABS: 1.5 10*3/uL — AB (ref 0.1–1.0)
MONOS PCT: 13 %
NEUTROS ABS: 8.7 10*3/uL — AB (ref 1.7–7.7)
Neutrophils Relative %: 74 %

## 2015-07-17 LAB — COMPREHENSIVE METABOLIC PANEL
ALT: 109 U/L — AB (ref 17–63)
AST: 75 U/L — ABNORMAL HIGH (ref 15–41)
Albumin: 3.7 g/dL (ref 3.5–5.0)
Alkaline Phosphatase: 69 U/L (ref 38–126)
Anion gap: 13 (ref 5–15)
BUN: 9 mg/dL (ref 6–20)
CALCIUM: 8.9 mg/dL (ref 8.9–10.3)
CHLORIDE: 100 mmol/L — AB (ref 101–111)
CO2: 21 mmol/L — ABNORMAL LOW (ref 22–32)
CREATININE: 1.63 mg/dL — AB (ref 0.61–1.24)
GFR, EST NON AFRICAN AMERICAN: 52 mL/min — AB (ref >60)
Glucose, Bld: 156 mg/dL — ABNORMAL HIGH (ref 65–99)
Potassium: 4.1 mmol/L (ref 3.5–5.1)
Sodium: 134 mmol/L — ABNORMAL LOW (ref 135–145)
TOTAL PROTEIN: 7.3 g/dL (ref 6.5–8.1)
Total Bilirubin: 0.6 mg/dL (ref 0.3–1.2)

## 2015-07-17 LAB — URINALYSIS, ROUTINE W REFLEX MICROSCOPIC
BILIRUBIN URINE: NEGATIVE
Glucose, UA: 100 mg/dL — AB
Ketones, ur: 15 mg/dL — AB
LEUKOCYTES UA: NEGATIVE
NITRITE: NEGATIVE
PH: 6.5 (ref 5.0–8.0)
PROTEIN: 100 mg/dL — AB
Specific Gravity, Urine: 1.016 (ref 1.005–1.030)
UROBILINOGEN UA: 1 mg/dL (ref 0.0–1.0)

## 2015-07-17 LAB — ETHANOL: Alcohol, Ethyl (B): <5 mg/dL (ref <5)

## 2015-07-17 LAB — CK TOTAL AND CKMB (NOT AT ARMC)
CK, MB: 6.3 ng/mL — AB (ref 0.5–5.0)
RELATIVE INDEX: 0.7 (ref 0.0–2.5)
Total CK: 905 U/L — ABNORMAL HIGH (ref 49–397)

## 2015-07-17 LAB — I-STAT TROPONIN, ED: TROPONIN I, POC: 0 ng/mL (ref 0.00–0.08)

## 2015-07-17 LAB — T4, FREE: FREE T4: 0.98 ng/dL (ref 0.61–1.12)

## 2015-07-17 LAB — URINE MICROSCOPIC-ADD ON

## 2015-07-17 LAB — LIPID PANEL
CHOLESTEROL: 210 mg/dL — AB (ref 0–200)
HDL: 36 mg/dL — AB (ref >40)
LDL Cholesterol: 145 mg/dL — ABNORMAL HIGH (ref 0–99)
TRIGLYCERIDES: 146 mg/dL (ref <150)
Total CHOL/HDL Ratio: 5.8 RATIO
VLDL: 29 mg/dL (ref 0–40)

## 2015-07-17 LAB — TSH: TSH: 6.798 u[IU]/mL — AB (ref 0.350–4.500)

## 2015-07-17 LAB — CK
Total CK: 1153 U/L — ABNORMAL HIGH (ref 49–397)
Total CK: 778 U/L — ABNORMAL HIGH (ref 49–397)

## 2015-07-17 LAB — APTT: aPTT: 24 seconds (ref 24–37)

## 2015-07-17 LAB — PROTIME-INR
INR: 1.1 (ref 0.00–1.49)
PROTHROMBIN TIME: 14.4 s (ref 11.6–15.2)

## 2015-07-17 MED ORDER — ASPIRIN 325 MG PO TABS
325.0000 mg | ORAL_TABLET | Freq: Every day | ORAL | Status: DC
  Administered 2015-07-17 – 2015-07-18 (×2): 325 mg via ORAL
  Filled 2015-07-17 (×2): qty 1

## 2015-07-17 MED ORDER — PANTOPRAZOLE SODIUM 40 MG PO TBEC
40.0000 mg | DELAYED_RELEASE_TABLET | Freq: Every day | ORAL | Status: DC
  Administered 2015-07-17 – 2015-07-18 (×2): 40 mg via ORAL
  Filled 2015-07-17 (×2): qty 1

## 2015-07-17 MED ORDER — LEVOTHYROXINE SODIUM 112 MCG PO TABS
224.0000 ug | ORAL_TABLET | Freq: Every day | ORAL | Status: DC
  Administered 2015-07-17 – 2015-07-18 (×2): 224 ug via ORAL
  Filled 2015-07-17 (×2): qty 2

## 2015-07-17 MED ORDER — MOMETASONE FURO-FORMOTEROL FUM 100-5 MCG/ACT IN AERO
2.0000 | INHALATION_SPRAY | Freq: Two times a day (BID) | RESPIRATORY_TRACT | Status: DC
  Administered 2015-07-17 – 2015-07-18 (×3): 2 via RESPIRATORY_TRACT
  Filled 2015-07-17: qty 8.8

## 2015-07-17 MED ORDER — STROKE: EARLY STAGES OF RECOVERY BOOK
Freq: Once | Status: AC
  Administered 2015-07-17: 04:00:00
  Filled 2015-07-17: qty 1

## 2015-07-17 MED ORDER — ALBUTEROL SULFATE (2.5 MG/3ML) 0.083% IN NEBU: 3.0000 mL | INHALATION_SOLUTION | Freq: Four times a day (QID) | RESPIRATORY_TRACT | Status: DC | PRN

## 2015-07-17 MED ORDER — NICOTINE 14 MG/24HR TD PT24
14.0000 mg | MEDICATED_PATCH | Freq: Every day | TRANSDERMAL | Status: DC
  Administered 2015-07-17 – 2015-07-18 (×2): 14 mg via TRANSDERMAL
  Filled 2015-07-17 (×2): qty 1

## 2015-07-17 MED ORDER — ENOXAPARIN SODIUM 80 MG/0.8ML ~~LOC~~ SOLN: 70.0000 mg | SUBCUTANEOUS | Status: DC

## 2015-07-17 MED ORDER — ENOXAPARIN SODIUM 60 MG/0.6ML ~~LOC~~ SOLN
50.0000 mg | SUBCUTANEOUS | Status: DC
  Administered 2015-07-17: 50 mg via SUBCUTANEOUS
  Filled 2015-07-17 (×3): qty 0.6

## 2015-07-17 MED ORDER — HYDROCODONE-ACETAMINOPHEN 5-325 MG PO TABS
1.0000 | ORAL_TABLET | Freq: Four times a day (QID) | ORAL | Status: DC | PRN
  Administered 2015-07-17 – 2015-07-18 (×5): 1 via ORAL
  Filled 2015-07-17 (×5): qty 1

## 2015-07-17 MED ORDER — ATORVASTATIN CALCIUM 40 MG PO TABS
40.0000 mg | ORAL_TABLET | Freq: Every day | ORAL | Status: DC
  Administered 2015-07-17 – 2015-07-18 (×2): 40 mg via ORAL
  Filled 2015-07-17 (×2): qty 1

## 2015-07-17 MED ORDER — ACETAMINOPHEN 325 MG PO TABS
650.0000 mg | ORAL_TABLET | Freq: Four times a day (QID) | ORAL | Status: DC | PRN
Start: 2015-07-17 — End: 2015-07-18
  Administered 2015-07-17: 650 mg via ORAL
  Filled 2015-07-17: qty 2

## 2015-07-17 MED ORDER — SODIUM CHLORIDE 0.9 % IV SOLN
INTRAVENOUS | Status: DC
  Administered 2015-07-17: 15:00:00 via INTRAVENOUS

## 2015-07-17 MED ORDER — HEPARIN SODIUM (PORCINE) 5000 UNIT/ML IJ SOLN
5000.0000 [IU] | Freq: Three times a day (TID) | INTRAMUSCULAR | Status: DC
  Administered 2015-07-17: 5000 [IU] via SUBCUTANEOUS
  Filled 2015-07-17: qty 1

## 2015-07-17 MED ORDER — ALBUTEROL SULFATE HFA 108 (90 BASE) MCG/ACT IN AERS
2.0000 | INHALATION_SPRAY | Freq: Four times a day (QID) | RESPIRATORY_TRACT | Status: DC | PRN
Start: 2015-07-17 — End: 2015-07-17

## 2015-07-17 NOTE — Consult Note (Signed)
Admission H&P    Chief Complaint: New onset speech difficulty and right-sided weakness and numbness.  HPI: Troy [name] is an 39 y.o. male history of Hodgkin's disease, thyroid cancer, Barrett's esophagus, GI bleed, pulmonary fibrosis hypertension and drug abuse, presenting with new onset speech output difficulty as well as complained of weakness and numbness involving right extremities. Onset of symptoms was at 9:30 PM tonight. He has no previous history of stroke nor TIA. CT scan of his head showed no acute intracranial abnormality. MRI showed tiny punctate foci of high signal intensity on DWI sequence within cortical gray matter of the right frontal lobe and left parietal lobe, possibly acute ischemic infarction.  LSN: 9:30 PM on 07/16/2015 tPA Given: No: No clear objective focal deficits mRankin:  Past Medical History  Diagnosis Date  . Hodgkin disease ([place])   . Thyroid cancer ([place])   . Barrett's esophagus   . GI bleed   . Pulmonary fibrosis ([place])   . Hypertension   . Drug abuse   . Asthma     Past Surgical History  Procedure Laterality Date  . Spleenectomy    . Thyroidectomy    . Parathyroidectomy      History reviewed. No pertinent family history. Social History:  reports that he has been smoking Cigarettes.  He does not have any smokeless tobacco history on file. He reports that he drinks alcohol. He reports that he uses illicit drugs (Marijuana).  Allergies: No Known Allergies  Medications: Outpatient medications were reviewed by me.  ROS: History obtained from the patient  General ROS: negative for - chills, fatigue, fever, night sweats, weight gain or weight loss Psychological ROS: negative for - behavioral disorder, hallucinations, memory difficulties, mood swings or suicidal ideation Ophthalmic ROS: negative for - blurry vision, double vision, eye pain or loss of vision ENT ROS: negative for - epistaxis, nasal discharge, oral lesions, sore throat, tinnitus or  vertigo Allergy and Immunology ROS: negative for - hives or itchy/watery eyes Hematological and Lymphatic ROS: negative for - bleeding problems, bruising or swollen lymph nodes Endocrine ROS: negative for - galactorrhea, hair pattern changes, polydipsia/polyuria or temperature intolerance Respiratory ROS: negative for - cough, hemoptysis, shortness of breath or wheezing Cardiovascular ROS: negative for - chest pain, dyspnea on exertion, edema or irregular heartbeat Gastrointestinal ROS: negative for - abdominal pain, diarrhea, hematemesis, nausea/vomiting or stool incontinence Genito-Urinary ROS: negative for - dysuria, hematuria, incontinence or urinary frequency/urgency Musculoskeletal ROS: negative for - joint swelling or muscular weakness Neurological ROS: as noted in HPI Dermatological ROS: negative for rash and skin lesion changes  Physical Examination: Blood pressure 142/75, pulse 123, temperature 98.5 F (36.9 C), temperature source Oral, resp. rate 12, SpO2 99 %.  HEENT-  Normocephalic, no lesions, without obvious abnormality.  Normal external eye and conjunctiva.  Normal TM's bilaterally.  Normal auditory canals and external ears. Normal external nose, mucus membranes and septum.  Normal pharynx. Neck supple with no masses, nodes, nodules or enlargement. Cardiovascular - regular rate and rhythm, S1, S2 normal, no murmur, click, rub or gallop Lungs - chest clear, no wheezing, rales, normal symmetric air entry Abdomen - soft, non-tender; bowel sounds normal; no masses,  no organomegaly Extremities - no joint deformities, effusion, or inflammation and no edema  Neurologic Examination: Mental Status: Alert, oriented, moderately anxious.  Stuttering speech pattern without evidence of aphasia. Able to follow commands without difficulty. Cranial Nerves: II-Visual fields were normal. III/IV/VI-Pupils were equal and reacted normally to light. Extraocular movements were full  and  conjugate.    V/VII-reduced perception of tactile sensation over right side of the face compared to left; no facial weakness. VIII-normal. X-mild dysarthria with suspected embellishment. XI: trapezius strength/neck flexion strength normal bilaterally XII-midline tongue extension with normal strength. Motor: 5/5 bilaterally with normal tone and bulk; no drift of any extremities. Sensory: Reduced perception of tactile sensation over right extremities compared to left extremities. Deep Tendon Reflexes: 2+ and symmetric. Plantars: Mute bilaterally Cerebellar: Normal finger-to-nose testing bilaterally. Carotid auscultation: Normal  Results for orders placed or performed during the hospital encounter of 07/16/15 (from the past 48 hour(s))  I-Stat Chem 8, ED  (not at [facility])     Status: Abnormal   Collection Time: 07/17/15 12:24 AM  Result Value Ref Range   Sodium 137 135 - 145 mmol/L   Potassium 4.1 3.5 - 5.1 mmol/L   Chloride 105 101 - 111 mmol/L   BUN 12 6 - 20 mg/dL   Creatinine, Ser 1.60 (H) 0.61 - 1.24 mg/dL   Glucose, Bld 166 (H) 65 - 99 mg/dL   Calcium, Ion 0.98 (L) 1.12 - 1.23 mmol/L   TCO2 21 0 - 100 mmol/L   Hemoglobin 17.3 (H) 13.0 - 17.0 g/dL   HCT 51.0 39.0 - 52.0 %   Ct Head Wo Contrast  07/17/2015   CLINICAL DATA:  Acute onset of right-sided weakness and right-sided facial droop. Slurred speech. Code stroke. Initial encounter.  EXAM: CT HEAD WITHOUT CONTRAST  TECHNIQUE: Contiguous axial images were obtained from the base of the skull through the vertex without intravenous contrast.  COMPARISON:  CT of the head performed 08/15/2014  FINDINGS: There is no evidence of acute infarction, mass lesion, or intra- or extra-axial hemorrhage on CT.  The posterior fossa, including the cerebellum, brainstem and fourth ventricle, is within normal limits. The third and lateral ventricles, and basal ganglia are unremarkable in appearance. The cerebral hemispheres are symmetric in appearance,  with normal gray-white differentiation. No mass effect or midline shift is seen.  There is no evidence of fracture; visualized osseous structures are unremarkable in appearance. The orbits are within normal limits. The paranasal sinuses and mastoid air cells are well-aerated. No significant soft tissue abnormalities are seen.  IMPRESSION: Unremarkable noncontrast CT of the head.  These results were called by telephone at the time of interpretation on 07/17/2015 at 12:01 am to Dr. Nicole Kindred, who verbally acknowledged these results.   Electronically Signed   By: Garald Balding M.D.   On: 07/17/2015 00:03    Assessment: 39 y.o. male with a history of hypertension presenting with possible small acute right frontal and left parietal ischemic infarction.  Stroke Risk Factors - hypertension  Plan: 1. HgbA1c, fasting lipid panel 2. MRI MRA  of the brain without contrast 3. PT consult, OT consult, Speech consult 4. Echocardiogram 5. Carotid dopplers 6. Prophylactic therapy-Antiplatelet med: Aspirin 7. Risk factor modification 8. Hypercoagulopathy panel  C.R. Nicole Kindred, MD [facility] [phone]  07/17/2015, 12:38 AM

## 2015-07-17 NOTE — Progress Notes (Signed)
  Echocardiogram 2D Echocardiogram has been performed.  Troy [name] 07/17/2015, 10:19 AM

## 2015-07-17 NOTE — ED Notes (Signed)
Pt given urinal and instructed to provide urine sample; pt verbalized understanding

## 2015-07-17 NOTE — Consult Note (Signed)
[facility] Face-to-Face Psychiatry Consult   Reason for Consult:  Stroke vs Conversion disorder- neurological symptoms of stroke  Referring Physician:  Dr. Sloan Leiter Patient Identification: Troy [name] MRN:  [id number] Principal Diagnosis: Stroke [facility]) Diagnosis:   Patient Active Problem List   Diagnosis Date Noted  . Stroke [facility]) [I63.9] 07/17/2015    Total Time spent with patient: 1 hour  Subjective:   Troy [name] is a 39 y.o. male patient admitted with neurological symptoms suggest new onset stroke.  HPI:  Troy [name] is a 39 y.o. male seen, chart reviewed and case discussed with Dr. Sloan Leiter for face-to-face psychiatric consultation and evaluation of new onset neurological symptoms similar like stroke or may be part of conversion disorder. Patient was admitted to [facility] secondary to new onset speech difficulties and right-sided weakness. Patient the MRIs scan of the brain is not conclusive of stroke. Patient has a history of substance abuse including Heroin, alcohol and marijuana. Patient lost his job secondary to headaches and opiates and also attended substance abuse treatment program in [place] called at [employer]. Patient had legal trouble and spent time in prison for position of illegal substances. Patient was admitted to acute psychiatric hospitalization in [facility] and also [facility] and was diagnosed with the dual diagnosis of substance abuse and also possible bipolar disorder. Patient reportedly discontinued medication management before going to [place] where he stayed one year without having any manic or depressive symptoms. Patient reported he has been sober for the last 18 months from opiates but uses occasionally marijuana and alcohol. Patient denies current symptoms of depression, anxiety, hallucinations, delusions, paranoia. Patient has no reported suicidal/homicidal ideation, intention or  plans. Patient has no evidence of psychosis. Patient has been staying with his brother and family who are supportive to him. Patient's CT scan of his head showed no acute intracranial abnormality. MRI was inconclusive.Patient urine drug screen positive for tetrahydrocannabinol.   Past Psychiatric History: He has substance abuse significant for opioids, and marijuana. Patient reportedly diagnosed with bipolar disorder while in the acute psychiatric hospitalization but denied current outpatient medication management or medication treatment.  Risk to Self: Is patient at risk for suicide?: No Risk to Others:   Prior Inpatient Therapy:   Prior Outpatient Therapy:    Past Medical History:  Past Medical History  Diagnosis Date  . Hodgkin disease ([place])   . Thyroid cancer ([place])   . Barrett's esophagus   . GI bleed   . Pulmonary fibrosis ([place])   . Hypertension   . Drug abuse   . Asthma     Past Surgical History  Procedure Laterality Date  . Spleenectomy    . Thyroidectomy    . Parathyroidectomy     Family History: History reviewed. No pertinent family history. Family Psychiatric  History: Significant for alcohol in paternal grandfather and maternal grandmother. Patient denied family history of bipolar disorder, depression and psychosis.  Social History:  History  Alcohol Use  . Yes     History  Drug Use  . Yes  . Special: Marijuana    Comment: heroin    Social History   Social History  . Marital Status: Unknown    Spouse Name: N/A  . Number of Children: N/A  . Years of Education: N/A   Social History Main Topics  . Smoking status: Current Some Day Smoker    Types: Cigarettes  . Smokeless tobacco: None  . Alcohol Use:  Yes  . Drug Use: Yes    Special: Marijuana     Comment: heroin  . Sexual Activity: Not Asked   Other Topics Concern  . 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 He has a 4 years nursing degree and worked in the [employer] about 5-6 years and then he lost his license due to addiction to opiates.                          Allergies:   Allergies  Allergen Reactions  . Ciprofloxacin Other (See Comments)    Spontaneous rupture of tendon  . Thelma Barge Officinalis] Anaphylaxis    Labs:  Results for orders placed or performed during the hospital encounter of 07/16/15 (from the past 48 hour(s))  I-stat troponin, ED (not at [facility])     Status: None   Collection Time: 07/17/15 12:22 AM  Result Value Ref Range   Troponin i, poc 0.00 0.00 - 0.08 ng/mL   Comment 3            Comment: Due to the release kinetics of cTnI, a negative result within the first hours of the onset of symptoms does not rule out myocardial infarction with certainty. If myocardial infarction is still suspected, repeat the test at appropriate intervals.   I-Stat Chem 8, ED  (not at [facility], [facility])     Status: Abnormal   Collection Time: 07/17/15 12:24 AM  Result Value Ref Range   Sodium 137 135 - 145 mmol/L   Potassium 4.1 3.5 - 5.1 mmol/L   Chloride 105 101 - 111 mmol/L   BUN 12 6 - 20 mg/dL   Creatinine, Ser 1.60 (H) 0.61 - 1.24 mg/dL   Glucose, Bld 166 (H) 65 - 99 mg/dL   Calcium, Ion 0.98 (L) 1.12 - 1.23 mmol/L   TCO2 21 0 - 100 mmol/L   Hemoglobin 17.3 (H) 13.0 - 17.0 g/dL   HCT 51.0 39.0 - 52.0 %  Protime-INR     Status: None   Collection Time: 07/17/15 12:35 AM  Result Value Ref Range   Prothrombin Time 14.4 11.6 - 15.2 seconds   INR 1.10 0.00 - 1.49  APTT     Status: None   Collection Time: 07/17/15 12:35 AM  Result Value Ref Range   aPTT 24 24 - 37 seconds  Differential     Status: Abnormal   Collection Time: 07/17/15 12:35 AM  Result Value Ref Range   Neutrophils Relative % 74 %   Neutro Abs 8.7 (H) 1.7 - 7.7 K/uL   Lymphocytes Relative 13 %   Lymphs Abs 1.5 0.7 - 4.0 K/uL   Monocytes  Relative 13 %   Monocytes Absolute 1.5 (H) 0.1 - 1.0 K/uL   Eosinophils Relative 0 %   Eosinophils Absolute 0.0 0.0 - 0.7 K/uL   Basophils Relative 0 %   Basophils Absolute 0.0 0.0 - 0.1 K/uL  Comprehensive metabolic panel     Status: Abnormal   Collection Time: 07/17/15 12:35 AM  Result Value Ref Range   Sodium 134 (L) 135 - 145 mmol/L   Potassium 4.1 3.5 - 5.1 mmol/L   Chloride 100 (L) 101 - 111 mmol/L   CO2 21 (L) 22 - 32 mmol/L   Glucose, Bld 156 (H) 65 - 99 mg/dL  BUN 9 6 - 20 mg/dL   Creatinine, Ser 1.63 (H) 0.61 - 1.24 mg/dL   Calcium 8.9 8.9 - 10.3 mg/dL   Total Protein 7.3 6.5 - 8.1 g/dL   Albumin 3.7 3.5 - 5.0 g/dL   AST 75 (H) 15 - 41 U/L   ALT 109 (H) 17 - 63 U/L   Alkaline Phosphatase 69 38 - 126 U/L   Total Bilirubin 0.6 0.3 - 1.2 mg/dL   GFR calc non Af Amer 52 (L) >60 mL/min   GFR calc Af Amer >60 >60 mL/min    Comment: (NOTE) The eGFR has been calculated using the CKD EPI equation. This calculation has not been validated in all clinical situations. eGFR's persistently <60 mL/min signify possible Chronic Kidney Disease.    Anion gap 13 5 - 15  CK     Status: Abnormal   Collection Time: 07/17/15 12:35 AM  Result Value Ref Range   Total CK 1153 (H) 49 - 397 U/L  TSH     Status: Abnormal   Collection Time: 07/17/15 12:35 AM  Result Value Ref Range   TSH 6.798 (H) 0.350 - 4.500 uIU/mL  T4, free     Status: None   Collection Time: 07/17/15 12:35 AM  Result Value Ref Range   Free T4 0.98 0.61 - 1.12 ng/dL  Ethanol     Status: None   Collection Time: 07/17/15 12:36 AM  Result Value Ref Range   Alcohol, Ethyl (B) <5 <5 mg/dL    Comment:        LOWEST DETECTABLE LIMIT FOR SERUM ALCOHOL IS 5 mg/dL FOR MEDICAL PURPOSES ONLY   Urine rapid drug screen (hosp performed)not at [facility]     Status: Abnormal   Collection Time: 07/17/15  2:32 AM  Result Value Ref Range   Opiates NONE DETECTED NONE DETECTED   Cocaine NONE DETECTED NONE DETECTED   Benzodiazepines  NONE DETECTED NONE DETECTED   Amphetamines NONE DETECTED NONE DETECTED   Tetrahydrocannabinol POSITIVE (A) NONE DETECTED   Barbiturates NONE DETECTED NONE DETECTED    Comment:        DRUG SCREEN FOR MEDICAL PURPOSES ONLY.  IF CONFIRMATION IS NEEDED FOR ANY PURPOSE, NOTIFY LAB WITHIN 5 DAYS.        LOWEST DETECTABLE LIMITS FOR URINE DRUG SCREEN Drug Class       Cutoff (ng/mL) Amphetamine      1000 Barbiturate      200 Benzodiazepine   833 Tricyclics       383 Opiates          300 Cocaine          300 THC              50   Urinalysis, Routine w reflex microscopic (not at [facility])     Status: Abnormal   Collection Time: 07/17/15  2:33 AM  Result Value Ref Range   Color, Urine YELLOW YELLOW   APPearance CLOUDY (A) CLEAR   Specific Gravity, Urine 1.016 1.005 - 1.030   pH 6.5 5.0 - 8.0   Glucose, UA 100 (A) NEGATIVE mg/dL   Hgb urine dipstick MODERATE (A) NEGATIVE   Bilirubin Urine NEGATIVE NEGATIVE   Ketones, ur 15 (A) NEGATIVE mg/dL   Protein, ur 100 (A) NEGATIVE mg/dL   Urobilinogen, UA 1.0 0.0 - 1.0 mg/dL   Nitrite NEGATIVE NEGATIVE   Leukocytes, UA NEGATIVE NEGATIVE  Urine microscopic-add on     Status: Abnormal   Collection Time: 07/17/15  2:33 AM  Result Value Ref Range   Squamous Epithelial / LPF RARE RARE   WBC, UA 7-10 <3 WBC/hpf   RBC / HPF 7-10 <3 RBC/hpf   Bacteria, UA RARE RARE   Casts HYALINE CASTS (A) NEGATIVE   Urine-Other MUCOUS PRESENT   Lipid panel     Status: Abnormal   Collection Time: 07/17/15  4:40 AM  Result Value Ref Range   Cholesterol 210 (H) 0 - 200 mg/dL   Triglycerides 146 <150 mg/dL   HDL 36 (L) >40 mg/dL   Total CHOL/HDL Ratio 5.8 RATIO   VLDL 29 0 - 40 mg/dL   LDL Cholesterol 145 (H) 0 - 99 mg/dL    Comment:        Total Cholesterol/HDL:CHD Risk Coronary Heart Disease Risk Table                     Men   Women  1/2 Average Risk   3.4   3.3  Average Risk       5.0   4.4  2 X Average Risk   9.6   7.1  3 X Average Risk  23.4   11.0         Use the calculated Patient Ratio above and the CHD Risk Table to determine the patient's CHD Risk.        ATP III CLASSIFICATION (LDL):  <100     mg/dL   Optimal  100-129  mg/dL   Near or Above                    Optimal  130-159  mg/dL   Borderline  160-189  mg/dL   High  >190     mg/dL   Very High   CK total and CKMB (cardiac)not at [facility]     Status: Abnormal   Collection Time: 07/17/15  4:40 AM  Result Value Ref Range   Total CK 905 (H) 49 - 397 U/L   CK, MB 6.3 (H) 0.5 - 5.0 ng/mL   Relative Index 0.7 0.0 - 2.5    Current Facility-Administered Medications  Medication Dose Route Frequency Provider Last Rate Last Dose  . acetaminophen (TYLENOL) tablet 650 mg  650 mg Oral Q6H PRN Jonetta Osgood, MD   650 mg at 07/17/15 0850  . albuterol (PROVENTIL) (2.5 MG/3ML) 0.083% nebulizer solution 3 mL  3 mL Inhalation Q6H PRN Etta Quill, DO      . aspirin tablet 325 mg  325 mg Oral Daily Jonetta Osgood, MD   325 mg at 07/17/15 0801  . heparin injection 5,000 Units  5,000 Units Subcutaneous 3 times per day Etta Quill, DO   5,000 Units at 07/17/15 0604  . HYDROcodone-acetaminophen (NORCO/VICODIN) 5-325 MG per tablet 1 tablet  1 tablet Oral Q6H PRN Gardiner Barefoot, NP   1 tablet at 07/17/15 0430  . levothyroxine (SYNTHROID, LEVOTHROID) tablet 224 mcg  224 mcg Oral QAC breakfast Etta Quill, DO   224 mcg at 07/17/15 0801  . mometasone-formoterol (DULERA) 100-5 MCG/ACT inhaler 2 puff  2 puff Inhalation BID Etta Quill, DO   2 puff at 07/17/15 0910  . nicotine (NICODERM CQ - dosed in mg/24 hours) patch 14 mg  14 mg Transdermal Daily Gardiner Barefoot, NP   14 mg at 07/17/15 0604  . pantoprazole (PROTONIX) EC tablet 40 mg  40 mg Oral Daily Etta Quill, DO   40 mg  at 07/17/15 0804    Musculoskeletal: Strength & Muscle Tone: decreased Gait & Station: unable to stand Patient leans: N/A  Psychiatric Specialty Exam: ROS difficulty speaking, some weakness on  right side of the body but denied nausea or vomiting, mild chest pain and shortness of breath. No Fever-chills, No Headache, No changes with Vision or hearing, reports vertigo No problems swallowing food or Liquids, No Chest pain, Cough or Shortness of Breath, No Abdominal pain, No Nausea or Vommitting, Bowel movements are regular, No Blood in stool or Urine, No dysuria, No new skin rashes or bruises, No new joints pains-aches,  No new weakness, tingling, numbness in any extremity, No recent weight gain or loss, No polyuria, polydypsia or polyphagia,   A full 10 point Review of Systems was done, except as stated above, all other Review of Systems were negative.  Blood pressure 180/112, pulse 97, temperature 98.5 F (36.9 C), temperature source Oral, resp. rate 18, height 6' (1.829 m), weight 24.1 kg (53 lb 2.1 oz), SpO2 96 %.Body mass index is 7.2 kg/(m^2).  General Appearance: Casual  Eye Contact::  Good  Speech:  Clear and Coherent and Slurred  Volume:  Decreased  Mood:  Depressed  Affect:  Constricted and Depressed  Thought Process:  Coherent and Goal Directed  Orientation:  Full (Time, Place, and Person)  Thought Content:  WDL  Suicidal Thoughts:  No  Homicidal Thoughts:  No  Memory:  Immediate;   Good Recent;   Good Remote;   Good  Judgement:  Intact  Insight:  Good  Psychomotor Activity:  Decreased  Concentration:  Good  Recall:  Good  Fund of Knowledge:Good  Language: Good  Akathisia:  Negative  Handed:  Right  AIMS (if indicated):     Assets:  Communication Skills Desire for Improvement Financial Resources/Insurance Housing Leisure Time Resilience Social Support Talents/Skills Transportation Vocational/Educational  ADL's:  Intact  Cognition: WNL  Sleep:      Treatment Plan Summary: Daily contact with patient to assess and evaluate symptoms and progress in treatment and Medication management  Disposition: Patient has no safety concerns Patient  neurologic symptoms slowly improving Patient has no acute mental health symptoms Patient contract for safety Patient does not meet criteria for psychiatric inpatient admission. Supportive therapy provided about ongoing stressors. Appreciate psychiatric consultation and we sign off at this time Please contact [phone] or [phone] if needs further assistance   Kesi Perrow,JANARDHAHA R. 07/17/2015 10:54 AM

## 2015-07-17 NOTE — Progress Notes (Signed)
STROKE TEAM PROGRESS NOTE   HISTORY Troy [name] is an 39 y.o. male history of Hodgkin's disease, thyroid cancer, Barrett's esophagus, GI bleed, pulmonary fibrosis hypertension and drug abuse, presenting with new onset speech output difficulty as well as complained of weakness and numbness involving right extremities. Onset of symptoms was at 9:30 PM tonight 07/16/2015 (LKW). He has no previous history of stroke nor TIA. CT scan of his head showed no acute intracranial abnormality. MRI showed tiny punctate foci of high signal intensity on DWI sequence within cortical gray matter of the right frontal lobe and left parietal lobe, possibly acute ischemic infarction. Patient was not administered TPA secondary to no clear objective focal deficits. He was admitted for further evaluation and treatment.   SUBJECTIVE (INTERVAL HISTORY) No family is at the bedside.  Overall he feels his condition is stable. He power lifts competitively. He squated on Sunday 560# and deadlifted 635#   OBJECTIVE Temp:  [98 F (36.7 C)-98.5 F (36.9 C)] 98.5 F (36.9 C) (10/06 0755) Pulse Rate:  [97-123] 97 (10/06 0755) Cardiac Rhythm:  [-] Normal sinus rhythm (10/06 0700) Resp:  [10-23] 18 (10/06 0755) BP: (142-183)/(75-112) 180/112 mmHg (10/06 0755) SpO2:  [95 %-99 %] 96 % (10/06 0911) Weight:  [24.1 kg (53 lb 2.1 oz)] 24.1 kg (53 lb 2.1 oz) (10/06 0403)  CBC:  Recent Labs Lab 07/17/15 0024 07/17/15 0035  NEUTROABS  --  8.7*  HGB 17.3*  --   HCT 51.0  --     Basic Metabolic Panel:  Recent Labs Lab 07/17/15 0024 07/17/15 0035  NA 137 134*  K 4.1 4.1  CL 105 100*  CO2  --  21*  GLUCOSE 166* 156*  BUN 12 9  CREATININE 1.60* 1.63*  CALCIUM  --  8.9    Lipid Panel:    Component Value Date/Time   CHOL 210* 07/17/2015 0440   TRIG 146 07/17/2015 0440   HDL 36* 07/17/2015 0440   CHOLHDL 5.8 07/17/2015 0440   VLDL 29 07/17/2015 0440   LDLCALC 145* 07/17/2015 0440   HgbA1c: No results found for:  HGBA1C Urine Drug Screen:    Component Value Date/Time   LABOPIA NONE DETECTED 07/17/2015 0232   LABOPIA NEGATIVE 12/10/2013 2132   COCAINSCRNUR NONE DETECTED 07/17/2015 0232   LABBENZ NONE DETECTED 07/17/2015 0232   LABBENZ NEGATIVE 12/10/2013 2132   AMPHETMU NONE DETECTED 07/17/2015 0232   AMPHETMU NEGATIVE 12/10/2013 2132   THCU POSITIVE* 07/17/2015 0232   THCU NEGATIVE 12/10/2013 2132   LABBARB NONE DETECTED 07/17/2015 0232   LABBARB NEGATIVE 12/10/2013 2132      IMAGING  Dg Chest 2 View 07/17/2015   Minimal curvilinear scarring or atelectasis in the left base.     Ct Head Wo Contrast 07/17/2015   Unremarkable noncontrast CT of the head.    Mr Brain Wo Contrast 07/17/2015   1. Few tiny punctate foci of high signal intensity on DWI sequence within the cortical gray matter of the right frontal lobe and left parietal lobe as detailed above. These are favored to be artifactual in nature, although possible tiny acute ischemic infarcts are not entirely excluded, and could be considered if there is strong clinical suspicion for acute ischemia. While no definite ADC correlate seen with these foci, these are felt to be too small to resolve on corresponding ADC map. 2. Otherwise normal brain MRI.      PHYSICAL EXAM  Young well-built Caucasian male not in distress. . Afebrile. Head is nontraumatic.  Neck is supple without bruit.    Cardiac exam no murmur or gallop. Lungs are clear to auscultation. Distal pulses are well felt. Neurological Exam :  Awake alert oriented 3. Diminished verbal output and expressive language difficulties with nonfluent speech. Good comprehension. Able to name and repeat well. Extraocular movements are pharyngeal or nystagmus. Blinks to threat bilaterally. Fundi were not visualized. Vision acuity seems adequate. Face face appears mostly symmetric with slight nasolabial fold asymmetry when he smiles only. Tongue midline. Motor system exam no upper or lower extremity  drift but subjective weakness of right grip and intrinsic hand muscles as well as right hip flexors and ankle dorsiflexors. Mild subjective decrease right hemibody sensation. Coordination slow but accurate. Reflexes 1+ symmetric. Plantars downgoing. Gait was not tested. ASSESSMENT/PLAN Troy [name] is a 39 y.o. male with history of hodgkin's disease, thyroid cancer, Barrett's esophagus, drug abuse presenting with new onset speech difficulty and right-sided weakness and numbness. He did not receive IV t-PA due to no clear deficits.   Stroke:  Right frontal and left parietal lobe tiny punctate infarcts, embolic secondary to unknown source  Resultant  R paresthesias, R hemiparesis, non-fluent expressive aphasia w/ robotic speech  MRI  Right frontal and left parietal lobe tiny punctate infarcts  Needs imaging to look at cerebral vasculature. Will hydrate and follow up creatiine tomorrow  Carotid Doppler  pending   2D Echo  pending  TEE to look for embolic source. Arranged with [place] for tomorrow.  If positive for PFO (patent foramen ovale), check bilateral lower extremity venous dopplers to rule out DVT as possible source of stroke. (I have made patient NPO after midnight tonight).   LDL 145  HgbA1c pending  Heparin 5000 units sq tid for VTE prophylaxis. Will change to lovenox  Diet Heart Room service appropriate?: Yes; Fluid consistency:: Thin  no antithrombotic prior to admission, now on aspirin 325 mg orally every day  Patient counseled to be compliant with his antithrombotic medications  Ongoing aggressive stroke risk factor management  Therapy recommendations:  pending   Disposition:  pending   Hypertension  elevated Permissive hypertension (OK if < 220/120) but gradually normalize in 5-7 days  Hyperlipidemia  Home meds:  NO STATIN  LDL 145, goal < 70  Added new lipitor 40  Continue statin at discharge  Other Stroke Risk  Factors  Cigarette smoker, advised to stop smoking  UDS positive for opitates and THC  ETOH use  Other Active Problems  ARF  Rhabdomyolysis  Hypothyroidism  Other significant Hx  Hx Hodgkin's disease  Hx Thyroid Cancer  Hx Barrett's esophagus  Hx  asthma  History of bipolar disorder/polysubstance Drug use  Hospital day # 0  Radene Journey [facility] for Pager information 07/17/2015 10:50 AM  I have personally examined this patient, reviewed notes, independently viewed imaging studies, participated in medical decision making and plan of care. I have made any additions or clarifications directly to the above note. Agree with note above. He presented with expressive language difficulties and right-sided weakness and numbness due to tiny bi-cerebral embolic infarcts in etiology to be determined. He remains at risk for neurological worsening, recurrent stroke, TIA and needs ongoing evaluation. I recommend check checking a TEE for PFO and lower extremity venous Dopplers for DVT as well as lab work for vasculitis and hypercoagulable panel. Start aspirin for now. Patient was counseled to quit smoking cigarettes as well as marijuana.  Antony Contras,  MD Medical Director [facility] Pager: [phone] 07/17/2015 5:15 PM    To contact Stroke Continuity provider, please refer to [email or web address] After hours, contact General Neurology

## 2015-07-17 NOTE — ED Notes (Signed)
Pt to be transported to floor by EMT on tele monitor

## 2015-07-17 NOTE — ED Notes (Signed)
Patient transported to MRI 

## 2015-07-17 NOTE — Progress Notes (Signed)
PATIENT DETAILS Name: Troy [name] Age: 39 y.o. Sex: male Date of Birth: 01-12-1976 Admit Date: 07/16/2015 Admitting Physician Hillary Bow, DO PCP:No PCP Per Patient  Subjective: Slow speech-clear at times-and at times slurred.   Assessment/Plan: Principal Problem: Acute NWG:NFAO right sided deficits-but not sure if speech difficulty is attributable to MR findings. Continue ASA, LDL 145 (goal <70)-started on statin. Echo neg for embolic source-have consulted cards for TEE. Neurology contemplating CTA once renal function improves further. Hypercoagulable/autoimmune work up deferred to Neuro.   ZHY:QMVHQIO from mild Rhabdomyolysis-continue IVF-recheck lytes in am  Rhabdomyolysis:?from being down in his car for a prolonged period of time prior to admission-hydrate and recheck CK in am.Watch closely while on statin  Hypothyroidism:continue Synthroid  Hx of Papillary Ca of Thyroid:followed at [facility]  Hx of Lymphoma:remote hx-when he was a child-suspect in remission or cured.  Hx of Bipolar disorder/Polysubstance NGE:XBMWUX he stopped taking his psych meds a few months back. Claims he has been "clean" for 18 months. Have consulted Psych.  B Asthma:stable-lungs clear-continue Bronchodilators  Disposition: Remain inpatient-Home when work up complete  Antimicrobial agents  See below  Anti-infectives    None      DVT Prophylaxis: Prophylactic Lovenox   Code Status: Full code   Family Communication None at bedside  Procedures: None  CONSULTS:  neurology and psychiatry  Time spent 30 minutes-Greater than 50% of this time was spent in counseling, explanation of diagnosis, planning of further management, and coordination of care.  MEDICATIONS: Scheduled Meds: . aspirin  325 mg Oral Daily  . atorvastatin  40 mg Oral q1800  . enoxaparin (LOVENOX) injection  70 mg Subcutaneous Q24H  . levothyroxine  224 mcg Oral QAC breakfast  .  mometasone-formoterol  2 puff Inhalation BID  . nicotine  14 mg Transdermal Daily  . pantoprazole  40 mg Oral Daily   Continuous Infusions: . sodium chloride     PRN Meds:.acetaminophen, albuterol, HYDROcodone-acetaminophen    PHYSICAL EXAM: Vital signs in last 24 hours: Filed Vitals:   07/17/15 0911 07/17/15 1000 07/17/15 1113 07/17/15 1200  BP:   171/102 179/117  Pulse:   103 98  Temp:   98.7 F (37.1 C) 98.2 F (36.8 C)  TempSrc:   Oral Oral  Resp:   18 18  Height:      Weight:  113.399 kg (250 lb)    SpO2: 96%  98% 100%    Weight change:  Filed Weights   07/17/15 0403 07/17/15 1000  Weight: 24.1 kg (53 lb 2.1 oz) 113.399 kg (250 lb)   Body mass index is 33.9 kg/(m^2).   Gen Exam: Awake and alert  Neck: Supple, No JVD.   Chest: B/L Clear.   CVS: S1 S2 Regular, no murmurs.  Abdomen: soft, BS +, non tender, non distended.  Extremities: no edema, lower extremities warm to touch. Neurologic: Mild right sided deficits Skin: No Rash.   Wounds: N/A.    Intake/Output from previous day: No intake or output data in the 24 hours ending 07/17/15 1257   LAB RESULTS: CBC  Recent Labs Lab 07/17/15 0024 07/17/15 0035  HGB 17.3*  --   HCT 51.0  --   LYMPHSABS  --  1.5  MONOABS  --  1.5*  EOSABS  --  0.0  BASOSABS  --  0.0    Chemistries   Recent Labs Lab 07/17/15 0024 07/17/15 0035  NA  137 134*  K 4.1 4.1  CL 105 100*  CO2  --  21*  GLUCOSE 166* 156*  BUN 12 9  CREATININE 1.60* 1.63*  CALCIUM  --  8.9    CBG: No results for input(s): GLUCAP in the last 168 hours.  GFR Estimated Creatinine Clearance: 80.7 mL/min (by C-G formula based on Cr of 1.63).  Coagulation profile  Recent Labs Lab 07/17/15 0035  INR 1.10    Cardiac Enzymes  Recent Labs Lab 07/17/15 0440  CKMB 6.3*    Invalid input(s): POCBNP No results for input(s): DDIMER in the last 72 hours. No results for input(s): HGBA1C in the last 72 hours.  Recent Labs   07/17/15 0440  CHOL 210*  HDL 36*  LDLCALC 145*  TRIG 146  CHOLHDL 5.8    Recent Labs  07/17/15 0035  TSH 6.798*   No results for input(s): VITAMINB12, FOLATE, FERRITIN, TIBC, IRON, RETICCTPCT in the last 72 hours. No results for input(s): LIPASE, AMYLASE in the last 72 hours.  Urine Studies No results for input(s): UHGB, CRYS in the last 72 hours.  Invalid input(s): UACOL, UAPR, USPG, UPH, UTP, UGL, UKET, UBIL, UNIT, UROB, ULEU, UEPI, UWBC, URBC, UBAC, CAST, UCOM, BILUA  MICROBIOLOGY: No results found for this or any previous visit (from the past 240 hour(s)).  RADIOLOGY STUDIES/RESULTS: Dg Chest 2 View  07/17/2015   CLINICAL DATA:  Tachycardia and right-sided weakness, onset tonight.  EXAM: CHEST  2 VIEW  COMPARISON:  None.  FINDINGS: Minimal linear scarring or atelectasis is present in the left base. The lungs are otherwise clear. There is no pleural effusion. The pulmonary vasculature is normal. Hilar and mediastinal contours are unremarkable.  IMPRESSION: Minimal curvilinear scarring or atelectasis in the left base.   Electronically Signed   By: Ellery Plunk M.D.   On: 07/17/2015 00:40   Ct Head Wo Contrast  07/17/2015   CLINICAL DATA:  Acute onset of right-sided weakness and right-sided facial droop. Slurred speech. Code stroke. Initial encounter.  EXAM: CT HEAD WITHOUT CONTRAST  TECHNIQUE: Contiguous axial images were obtained from the base of the skull through the vertex without intravenous contrast.  COMPARISON:  CT of the head performed 08/15/2014  FINDINGS: There is no evidence of acute infarction, mass lesion, or intra- or extra-axial hemorrhage on CT.  The posterior fossa, including the cerebellum, brainstem and fourth ventricle, is within normal limits. The third and lateral ventricles, and basal ganglia are unremarkable in appearance. The cerebral hemispheres are symmetric in appearance, with normal gray-white differentiation. No mass effect or midline shift is seen.   There is no evidence of fracture; visualized osseous structures are unremarkable in appearance. The orbits are within normal limits. The paranasal sinuses and mastoid air cells are well-aerated. No significant soft tissue abnormalities are seen.  IMPRESSION: Unremarkable noncontrast CT of the head.  These results were called by telephone at the time of interpretation on 07/17/2015 at 12:01 am to Dr. Roseanne Reno, who verbally acknowledged these results.   Electronically Signed   By: Roanna Raider M.D.   On: 07/17/2015 00:03   Mr Brain Wo Contrast  07/17/2015   CLINICAL DATA:  The initial evaluation for sudden onset aphasia with associated right-sided weakness and numbness.  EXAM: MRI HEAD WITHOUT CONTRAST  TECHNIQUE: Multiplanar, multiecho pulse sequences of the brain and surrounding structures were obtained without intravenous contrast.  COMPARISON:  Prior CT from 07/16/2015.  FINDINGS: On axial DWI sequence, there is a subtle 5 mm focus of  high signal intensity within the cortical gray matter of the right frontal lobe (series 4, image 41). No definite ADC correlate, although this focus is so small that this is felt to likely be difficult to discern on corresponding ADC map. Additionally, there is a subtle 9 mm focus of high signal intensity within the left parietal lobe on axial DWI sequence 38 additional possible punctate focus of restricted diffusion more cephalad within the left parietal lobe on axial image 44 no definite T2/FLAIR correlate seen about these lesions. These are favored to be artifactual in nature, although possible tiny foci of acute ischemia could be considered in the correct clinical setting. No other foci of restricted diffusion identified. Normal intravascular flow voids maintained. No acute or chronic intracranial hemorrhage.  Cerebral volume within normal limits for patient age. No significant white matter disease.  No mass lesion, midline shift, or mass effect. No hydrocephalus. No  extra-axial fluid collection. Probable small amount of trapped fluid within the right petrous apex.  Craniocervical junction within normal limits. Incidental note made of a partially empty sella.  No acute abnormality about the orbits.  Mild mucosal thickening within the paranasal sinuses. No mastoid effusion. Inner ear structures normal.  Bone marrow signal intensity within normal limits. Scalp soft tissues unremarkable.  IMPRESSION: 1. Few tiny punctate foci of high signal intensity on DWI sequence within the cortical gray matter of the right frontal lobe and left parietal lobe as detailed above. These are favored to be artifactual in nature, although possible tiny acute ischemic infarcts are not entirely excluded, and could be considered if there is strong clinical suspicion for acute ischemia. While no definite ADC correlate seen with these foci, these are felt to be too small to resolve on corresponding ADC map. 2. Otherwise normal brain MRI.   Electronically Signed   By: Rise Mu M.D.   On: 07/17/2015 02:21    Jeoffrey Massed, MD  [facility] Pager:[phone]  If 7PM-7AM, please contact night-coverage [email or web address] Password [email or web address] 07/17/2015, 12:57 PM   LOS: 0 days

## 2015-07-17 NOTE — Progress Notes (Signed)
Patient arrived to 5C09 AAOx4 and pleasant. Patient speaking slowly, headache and jaw pain. Tele applied, vitals taken and call bell at the bedside. Will continue to monitor. Ailed Defibaugh, Rande Brunt, RN

## 2015-07-17 NOTE — Evaluation (Signed)
Speech Language Pathology Evaluation Patient Details Name: Troy [name] MRN: [id number] DOB: 02-05-1976 Today's Date: 07/17/2015 Time: 0120-0140 SLP Time Calculation (min) (ACUTE ONLY): 20 min  Problem List:  Patient Active Problem List   Diagnosis Date Noted  . Stroke [facility]) 07/17/2015   Past Medical History:  Past Medical History  Diagnosis Date  . Hodgkin disease (HCC)   . Thyroid cancer (HCC)   . Barrett's esophagus   . GI bleed   . Pulmonary fibrosis (HCC)   . Hypertension   . Drug abuse   . Asthma    Past Surgical History:  Past Surgical History  Procedure Laterality Date  . Spleenectomy    . Thyroidectomy    . Parathyroidectomy     HPI:  Troy [name] is a 39 y.o. male with h/o hodgkin's disease, thyroid cancer, Barrett's esophagus, and illicit drug use (marijuana). Patient presented to the ED (10/5) with new onset speech output difficulty as well as complained of weakness and numbness involving right extremities. CT scan of his head showed no acute intracranial abnormality. MRI was inconclusive, possible tiny acute ischemic infarcts of the right frontal and left parietal lobe. Determine stroke or functional deficit. No previous documented CVA, TIA, or speech difficulty.    Assessment / Plan / Recommendation Clinical Impression  Pt presents with typical cognitive linguistic function. SLP assessed memory, language, awareness, attention, problem solving, and executive function, all within functional limits. He presents with psychogenic atypical speech issues (intermittment slow rate of speech) and hoarse vocal quality consistent with baseline "gruffness". The pt currently lives with his brother's family, he was going to move out this coming Tuesday. He has family in the area to assist if needed. Pt expressed that he feels he has made lots of improvement and is not worried about his speech. No f/u SLP needed at this time, will sign off.     SLP Assessment  Patient does  not need any further Speech Lanaguage Pathology Services    Follow Up Recommendations       Frequency and Duration        Pertinent Vitals/Pain Pain Assessment: Faces Faces Pain Scale: No hurt   SLP Goals     SLP Evaluation Prior Functioning  Cognitive/Linguistic Baseline: Within functional limits Type of Home: House  Lives With: Family Available Help at Discharge: Family Education: Bachelors Vocation: Full time employment   Cognition  Overall Cognitive Status: Within Functional Limits for tasks assessed Arousal/Alertness: Awake/alert Orientation Level: Oriented X4 Attention: Sustained Sustained Attention: Appears intact Memory: Appears intact Awareness: Appears intact Problem Solving: Appears intact Safety/Judgment: Appears intact    Comprehension  Auditory Comprehension Overall Auditory Comprehension: Appears within functional limits for tasks assessed Yes/No Questions: Within Functional Limits Commands: Within Functional Limits Conversation: Complex Visual Recognition/Discrimination Discrimination: Within Function Limits Reading Comprehension Reading Status: Within funtional limits    Expression Expression Primary Mode of Expression: Verbal Verbal Expression Overall Verbal Expression: Appears within functional limits for tasks assessed Initiation: No impairment Automatic Speech: Name;Social Response;Day of week Level of Generative/Spontaneous Verbalization: Conversation Repetition: No impairment Naming: No impairment Pragmatics: No impairment Other Verbal Expression Comments: intermittent slow rate of speech, hoarse vocal quality at baseline Written Expression Written Expression: Not tested   Oral / Motor Oral Motor/Sensory Function Overall Oral Motor/Sensory Function: Appears within functional limits for tasks assessed Motor Speech Overall Motor Speech: Impaired Respiration: Within functional limits Phonation: Hoarse Resonance: Within functional  limits Articulation: Within functional limitis Intelligibility: Intelligible Motor Planning: Witnin functional limits Motor  Speech Errors: Not applicable   GO    Riccardo Dubin, Student-SLP  Riccardo Dubin 07/17/2015, 2:00 PM

## 2015-07-17 NOTE — H&P (Addendum)
[facility] History and Physical  Troy [name] GEX:[id number] DOB: 06-19-1976 DOA: 07/16/2015  Referring physician: EDP PCP: No PCP Per Patient   Chief Complaint: New onset speech difficulty and R sided weakness and numbness   HPI: Troy [name] is a 39 y.o. male with h/o hodgkin's disease, thyroid cancer, Barrett's esophagus, drug abuse.  Patient presents to the ED with c/o new onset speech output difficulty as well as complained of weakness and numbness involving right extremities. Onset of symptoms was at 9:30 PM tonight. He has no previous history of stroke nor TIA. CT scan of his head showed no acute intracranial abnormality.  MRI was inconclusive.  Review of Systems: Systems reviewed.  As above, otherwise negative  Past Medical History  Diagnosis Date  . Hodgkin disease (HCC)   . Thyroid cancer (HCC)   . Barrett's esophagus   . GI bleed   . Pulmonary fibrosis (HCC)   . Hypertension   . Drug abuse   . Asthma    Past Surgical History  Procedure Laterality Date  . Spleenectomy    . Thyroidectomy    . Parathyroidectomy     Social History:  reports that he has been smoking Cigarettes.  He does not have any smokeless tobacco history on file. He reports that he drinks alcohol. He reports that he uses illicit drugs (Marijuana).  Allergies  Allergen Reactions  . Ciprofloxacin Other (See Comments)    Spontaneous rupture of tendon  . Earnestine Leys Officinalis] Anaphylaxis    History reviewed. No pertinent family history.   Prior to Admission medications   Medication Sig Start Date End Date Taking? Authorizing Provider  albuterol (PROVENTIL HFA;VENTOLIN HFA) 108 (90 BASE) MCG/ACT inhaler Inhale 2 puffs into the lungs every 6 (six) hours as needed. For shortness of breath.   Yes Historical Provider, MD  levothyroxine (SYNTHROID, LEVOTHROID) 112 MCG tablet Take 224 mcg by mouth daily.   Yes Historical Provider, MD  Omeprazole-Sodium Bicarbonate (ZEGERID) 20-1100 MG CAPS  Take 1 capsule by mouth daily before breakfast.   Yes Historical Provider, MD  Fluticasone-Salmeterol (ADVAIR) 250-50 MCG/DOSE AEPB Inhale 1 puff into the lungs every 12 (twelve) hours.    Historical Provider, MD   Physical Exam: Filed Vitals:   07/17/15 0230  BP: 183/81  Pulse: 106  Temp:   Resp: 10    BP 183/81 mmHg  Pulse 106  Temp(Src) 98.5 F (36.9 C) (Oral)  Resp 10  SpO2 98%  General Appearance:    Alert, oriented, no distress, appears stated age  Head:    Normocephalic, atraumatic  Eyes:    PERRL, EOMI, sclera non-icteric        Nose:   Nares without drainage or epistaxis. Mucosa, turbinates normal  Throat:   Moist mucous membranes. Oropharynx without erythema or exudate.  Neck:   Supple. No carotid bruits.  No thyromegaly.  No lymphadenopathy.   Back:     No CVA tenderness, no spinal tenderness  Lungs:     Clear to auscultation bilaterally, without wheezes, rhonchi or rales  Chest wall:    No tenderness to palpitation  Heart:    Regular rate and rhythm without murmurs, gallops, rubs  Abdomen:     Soft, non-tender, nondistended, normal bowel sounds, no organomegaly  Genitalia:    deferred  Rectal:    deferred  Extremities:   No clubbing, cyanosis or edema.  Pulses:   2+ and symmetric all extremities  Skin:   Skin color, texture, turgor normal, no  rashes or lesions  Lymph nodes:   Cervical, supraclavicular, and axillary nodes normal  Neurologic:   Mild dysarthria, suspected embellishment (see Dr. Marca Ancona note), stuttering speech.    Labs on Admission:  Basic Metabolic Panel:  Recent Labs Lab 07/17/15 0024 07/17/15 0035  NA 137 134*  K 4.1 4.1  CL 105 100*  CO2  --  21*  GLUCOSE 166* 156*  BUN 12 9  CREATININE 1.60* 1.63*  CALCIUM  --  8.9   Liver Function Tests:  Recent Labs Lab 07/17/15 0035  AST 75*  ALT 109*  ALKPHOS 69  BILITOT 0.6  PROT 7.3  ALBUMIN 3.7   No results for input(s): LIPASE, AMYLASE in the last 168 hours. No results for  input(s): AMMONIA in the last 168 hours. CBC:  Recent Labs Lab 07/17/15 0024 07/17/15 0035  NEUTROABS  --  8.7*  HGB 17.3*  --   HCT 51.0  --    Cardiac Enzymes:  Recent Labs Lab 07/17/15 0035  CKTOTAL 1153*    BNP (last 3 results) No results for input(s): PROBNP in the last 8760 hours. CBG: No results for input(s): GLUCAP in the last 168 hours.  Radiological Exams on Admission: Dg Chest 2 View  07/17/2015   CLINICAL DATA:  Tachycardia and right-sided weakness, onset tonight.  EXAM: CHEST  2 VIEW  COMPARISON:  None.  FINDINGS: Minimal linear scarring or atelectasis is present in the left base. The lungs are otherwise clear. There is no pleural effusion. The pulmonary vasculature is normal. Hilar and mediastinal contours are unremarkable.  IMPRESSION: Minimal curvilinear scarring or atelectasis in the left base.   Electronically Signed   By: Ellery Plunk M.D.   On: 07/17/2015 00:40   Ct Head Wo Contrast  07/17/2015   CLINICAL DATA:  Acute onset of right-sided weakness and right-sided facial droop. Slurred speech. Code stroke. Initial encounter.  EXAM: CT HEAD WITHOUT CONTRAST  TECHNIQUE: Contiguous axial images were obtained from the base of the skull through the vertex without intravenous contrast.  COMPARISON:  CT of the head performed 08/15/2014  FINDINGS: There is no evidence of acute infarction, mass lesion, or intra- or extra-axial hemorrhage on CT.  The posterior fossa, including the cerebellum, brainstem and fourth ventricle, is within normal limits. The third and lateral ventricles, and basal ganglia are unremarkable in appearance. The cerebral hemispheres are symmetric in appearance, with normal gray-white differentiation. No mass effect or midline shift is seen.  There is no evidence of fracture; visualized osseous structures are unremarkable in appearance. The orbits are within normal limits. The paranasal sinuses and mastoid air cells are well-aerated. No significant  soft tissue abnormalities are seen.  IMPRESSION: Unremarkable noncontrast CT of the head.  These results were called by telephone at the time of interpretation on 07/17/2015 at 12:01 am to Dr. Roseanne Reno, who verbally acknowledged these results.   Electronically Signed   By: Roanna Raider M.D.   On: 07/17/2015 00:03   Mr Brain Wo Contrast  07/17/2015   CLINICAL DATA:  The initial evaluation for sudden onset aphasia with associated right-sided weakness and numbness.  EXAM: MRI HEAD WITHOUT CONTRAST  TECHNIQUE: Multiplanar, multiecho pulse sequences of the brain and surrounding structures were obtained without intravenous contrast.  COMPARISON:  Prior CT from 07/16/2015.  FINDINGS: On axial DWI sequence, there is a subtle 5 mm focus of high signal intensity within the cortical gray matter of the right frontal lobe (series 4, image 41). No definite ADC correlate, although  this focus is so small that this is felt to likely be difficult to discern on corresponding ADC map. Additionally, there is a subtle 9 mm focus of high signal intensity within the left parietal lobe on axial DWI sequence 38 additional possible punctate focus of restricted diffusion more cephalad within the left parietal lobe on axial image 44 no definite T2/FLAIR correlate seen about these lesions. These are favored to be artifactual in nature, although possible tiny foci of acute ischemia could be considered in the correct clinical setting. No other foci of restricted diffusion identified. Normal intravascular flow voids maintained. No acute or chronic intracranial hemorrhage.  Cerebral volume within normal limits for patient age. No significant white matter disease.  No mass lesion, midline shift, or mass effect. No hydrocephalus. No extra-axial fluid collection. Probable small amount of trapped fluid within the right petrous apex.  Craniocervical junction within normal limits. Incidental note made of a partially empty sella.  No acute abnormality  about the orbits.  Mild mucosal thickening within the paranasal sinuses. No mastoid effusion. Inner ear structures normal.  Bone marrow signal intensity within normal limits. Scalp soft tissues unremarkable.  IMPRESSION: 1. Few tiny punctate foci of high signal intensity on DWI sequence within the cortical gray matter of the right frontal lobe and left parietal lobe as detailed above. These are favored to be artifactual in nature, although possible tiny acute ischemic infarcts are not entirely excluded, and could be considered if there is strong clinical suspicion for acute ischemia. While no definite ADC correlate seen with these foci, these are felt to be too small to resolve on corresponding ADC map. 2. Otherwise normal brain MRI.   Electronically Signed   By: Rise Mu M.D.   On: 07/17/2015 02:21    EKG: Independently reviewed.  Assessment/Plan Principal Problem:   Stroke (HCC)   1. Stroke vs functional deficit -  1. Dr. Roseanne Reno wants admission and full stroke work up 2. Stroke pathway 3. 2d echo and carotid duplex ordered    Code Status: Full Code  Family Communication:  Disposition Plan: Admit to inpatient   Time spent: 50 min  Legacy Lacivita M. [facility] Pager [phone]  If 7AM-7PM, please contact the day team taking care of the patient [email or web address] Password [email or web address] 07/17/2015, 2:48 AM

## 2015-07-17 NOTE — Progress Notes (Signed)
    [facility] has been requested to perform a transesophageal echocardiogram on Troy [name] for TIA.  After careful review of history and examination, the risks and benefits of transesophageal echocardiogram have been explained including risks of esophageal damage, perforation (1:10,000 risk), bleeding, pharyngeal hematoma as well as other potential complications associated with conscious sedation including aspiration, arrhythmia, respiratory failure and death. Alternatives to treatment were discussed, questions were answered. Patient is willing to proceed.   Erlene Quan,  PA 07/17/2015 2:42 PM

## 2015-07-18 ENCOUNTER — Inpatient Hospital Stay (HOSPITAL_COMMUNITY): Payer: No Typology Code available for payment source | Admitting: Anesthesiology

## 2015-07-18 ENCOUNTER — Encounter (HOSPITAL_COMMUNITY): Payer: No Typology Code available for payment source

## 2015-07-18 ENCOUNTER — Inpatient Hospital Stay (HOSPITAL_COMMUNITY): Payer: No Typology Code available for payment source

## 2015-07-18 ENCOUNTER — Encounter (HOSPITAL_COMMUNITY): Payer: Self-pay | Admitting: *Deleted

## 2015-07-18 ENCOUNTER — Encounter (HOSPITAL_COMMUNITY)
Admission: EM | Disposition: A | Discharge status: Home / Self Care | Payer: Self-pay | Source: Home / Self Care | Attending: Internal Medicine

## 2015-07-18 DIAGNOSIS — I63039 Cerebral infarction due to thrombosis of unspecified carotid artery: Secondary | ICD-10-CM

## 2015-07-18 DIAGNOSIS — F1994 Other psychoactive substance use, unspecified with psychoactive substance-induced mood disorder: Secondary | ICD-10-CM

## 2015-07-18 DIAGNOSIS — E039 Hypothyroidism, unspecified: Secondary | ICD-10-CM

## 2015-07-18 DIAGNOSIS — M6282 Rhabdomyolysis: Secondary | ICD-10-CM

## 2015-07-18 DIAGNOSIS — I350 Nonrheumatic aortic (valve) stenosis: Secondary | ICD-10-CM

## 2015-07-18 HISTORY — PX: TEE WITHOUT CARDIOVERSION: SHX5443

## 2015-07-18 LAB — BASIC METABOLIC PANEL
Anion gap: 11 (ref 5–15)
BUN: 9 mg/dL (ref 6–20)
CHLORIDE: 97 mmol/L — AB (ref 101–111)
CO2: 26 mmol/L (ref 22–32)
CREATININE: 1.11 mg/dL (ref 0.61–1.24)
Calcium: 8.1 mg/dL — ABNORMAL LOW (ref 8.9–10.3)
GFR calc Af Amer: >60 mL/min (ref >60)
GFR calc non Af Amer: >60 mL/min (ref >60)
Glucose, Bld: 86 mg/dL (ref 65–99)
Potassium: 3.9 mmol/L (ref 3.5–5.1)
Sodium: 134 mmol/L — ABNORMAL LOW (ref 135–145)

## 2015-07-18 LAB — ANTITHROMBIN III: AntiThromb III Func: 92 % (ref 75–120)

## 2015-07-18 LAB — SEDIMENTATION RATE: Sed Rate: 1 mm/hr (ref 0–16)

## 2015-07-18 LAB — HEMOGLOBIN A1C
Hgb A1c MFr Bld: 5.6 % (ref 4.8–5.6)
Mean Plasma Glucose: 114 mg/dL

## 2015-07-18 SURGERY — ECHOCARDIOGRAM, TRANSESOPHAGEAL
Anesthesia: Moderate Sedation

## 2015-07-18 MED ORDER — AMLODIPINE BESYLATE 5 MG PO TABS: 5.0000 mg | ORAL_TABLET | Freq: Every day | ORAL | Status: DC

## 2015-07-18 MED ORDER — ASPIRIN 325 MG PO TABS: 325.0000 mg | ORAL_TABLET | Freq: Every day | ORAL | Status: AC

## 2015-07-18 MED ORDER — FENTANYL CITRATE (PF) 100 MCG/2ML IJ SOLN
INTRAMUSCULAR | Status: AC
  Filled 2015-07-18: qty 2

## 2015-07-18 MED ORDER — MIDAZOLAM HCL 5 MG/ML IJ SOLN
INTRAMUSCULAR | Status: AC
  Filled 2015-07-18: qty 2

## 2015-07-18 MED ORDER — LACTATED RINGERS IV SOLN
INTRAVENOUS | Status: DC | PRN
  Administered 2015-07-18: 15:00:00 via INTRAVENOUS

## 2015-07-18 MED ORDER — PROPOFOL 10 MG/ML IV BOLUS
INTRAVENOUS | Status: DC | PRN
  Administered 2015-07-18 (×5): 30 mg via INTRAVENOUS
  Administered 2015-07-18: 20 mg via INTRAVENOUS
  Administered 2015-07-18: 30 mg via INTRAVENOUS

## 2015-07-18 MED ORDER — PROPOFOL 500 MG/50ML IV EMUL
INTRAVENOUS | Status: DC | PRN
  Administered 2015-07-18: 100 ug/kg/min via INTRAVENOUS

## 2015-07-18 MED ORDER — IOHEXOL 350 MG/ML SOLN
50.0000 mL | Freq: Once | INTRAVENOUS | Status: AC | PRN
  Administered 2015-07-18: 100 mL via INTRAVENOUS

## 2015-07-18 MED ORDER — DIPHENHYDRAMINE HCL 50 MG/ML IJ SOLN
INTRAMUSCULAR | Status: AC
  Filled 2015-07-18: qty 1

## 2015-07-18 NOTE — Progress Notes (Signed)
Patient d/c home. D/c instructions given and patient verbalized understanding. Awaiting ride at this time.

## 2015-07-18 NOTE — Progress Notes (Signed)
STROKE TEAM PROGRESS NOTE   SUBJECTIVE (INTERVAL HISTORY) No family present. Patient with questions about his stroke. Dr. Leonie Man shared with him the images and differential. Patient with hx neck/chest XRT with cancer treatment. Patient under stress - being evicted from his brother's home, his girlfriend with drug issues   OBJECTIVE Temp:  [98.2 F (36.8 C)-98.8 F (37.1 C)] 98.2 F (36.8 C) (10/07 0203) Pulse Rate:  [90-103] 99 (10/07 0651) Cardiac Rhythm:  [-] Sinus tachycardia (10/06 1900) Resp:  [18-20] 20 (10/07 0651) BP: (149-179)/(95-117) 151/102 mmHg (10/07 0651) SpO2:  [96 %-100 %] 99 % (10/07 0651) Weight:  [113.399 kg (250 lb)] 113.399 kg (250 lb) (10/06 1000)  CBC:   Recent Labs Lab 07/17/15 0024 07/17/15 0035  NEUTROABS  --  8.7*  HGB 17.3*  --   HCT 51.0  --     Basic Metabolic Panel:   Recent Labs Lab 07/17/15 0035 07/18/15 0522  NA 134* 134*  K 4.1 3.9  CL 100* 97*  CO2 21* 26  GLUCOSE 156* 86  BUN 9 9  CREATININE 1.63* 1.11  CALCIUM 8.9 8.1*    Lipid Panel:     Component Value Date/Time   CHOL 210* 07/17/2015 0440   TRIG 146 07/17/2015 0440   HDL 36* 07/17/2015 0440   CHOLHDL 5.8 07/17/2015 0440   VLDL 29 07/17/2015 0440   LDLCALC 145* 07/17/2015 0440   HgbA1c:  Lab Results  Component Value Date   HGBA1C 5.6 07/17/2015   Urine Drug Screen:     Component Value Date/Time   LABOPIA NONE DETECTED 07/17/2015 0232   LABOPIA NEGATIVE 12/10/2013 2132   COCAINSCRNUR NONE DETECTED 07/17/2015 0232   LABBENZ NONE DETECTED 07/17/2015 0232   LABBENZ NEGATIVE 12/10/2013 2132   AMPHETMU NONE DETECTED 07/17/2015 0232   AMPHETMU NEGATIVE 12/10/2013 2132   THCU POSITIVE* 07/17/2015 0232   THCU NEGATIVE 12/10/2013 2132   LABBARB NONE DETECTED 07/17/2015 0232   LABBARB NEGATIVE 12/10/2013 2132     IMAGING  Dg Chest 2 View 07/17/2015   Minimal curvilinear scarring or atelectasis in the left base.     Ct Head Wo Contrast 07/17/2015    Unremarkable noncontrast CT of the head.    Mr Brain Wo Contrast 07/17/2015   1. Few tiny punctate foci of high signal intensity on DWI sequence within the cortical gray matter of the right frontal lobe and left parietal lobe as detailed above. These are favored to be artifactual in nature, although possible tiny acute ischemic infarcts are not entirely excluded, and could be considered if there is strong clinical suspicion for acute ischemia. While no definite ADC correlate seen with these foci, these are felt to be too small to resolve on corresponding ADC map. 2. Otherwise normal brain MRI.     2D Echocardiogram   Left ventricle: The cavity size was normal. Wall thickness wasincreased in a pattern of moderate LVH. Systolic function wasmildly reduced. The estimated ejection fraction was in the rangeof 45% to 50%. Wall motion was normal; there were no regionalwall motion abnormalities. Left ventricular diastolic functionparameters were normal. - Aortic valve: There was mild to moderate regurgitation. - Left atrium: The atrium was mildly dilated.   PHYSICAL EXAM  Young well-built Caucasian male not in distress. . Afebrile. Head is nontraumatic. Neck is supple without bruit.    Cardiac exam no murmur or gallop. Lungs are clear to auscultation. Distal pulses are well felt. Neurological Exam :  Awake alert oriented 3. Speech is much improved  today without any significant aphasia. Good comprehension. Able to name and repeat well. Extraocular movements are pharyngeal or nystagmus. Blinks to threat bilaterally. Fundi were not visualized. Vision acuity seems adequate. Face face appears mostly symmetric with slight nasolabial fold asymmetry when he smiles only. Tongue midline. Motor system exam no upper or lower extremity drift but subjective weakness of right grip and intrinsic hand muscles as well as right hip flexors and ankle dorsiflexors. Mild subjective decrease right hemibody sensation.  Coordination slow but accurate. Reflexes 1+ symmetric. Plantars downgoing. Gait was not tested.   ASSESSMENT/PLAN [name] is a 39 y.o. male with history of hodgkin's disease, thyroid cancer, Barrett's esophagus, drug abuse presenting with new onset speech difficulty and right-sided weakness and numbness. He did not receive IV t-PA due to no clear deficits.   Stroke:  Right frontal and left parietal lobe tiny punctate infarcts, embolic secondary to unknown source  Resultant  R paresthesias, R hemiparesis, non-fluent expressive aphasia w/ robotic speech  MRI  Right frontal and left parietal lobe tiny punctate infarcts  CT angio head and neck as creatinine decreased from 1.6 to 1.1 today  Will cancel Carotid Doppler given CTA neck   2D Echo  EF 45-50%, no source of embolus  TEE to look for embolic source. Arranged with [facility] for today.    Bilateral lower extremity venous dopplers to rule out DVT as possible source of stroke pending   Check hypercoagulable and vasculitis labs. Orders placed  LDL 145  HgbA1c 5.6  Lovenox 40 mg sq daily  Diet NPO time specified  no antithrombotic prior to admission, now on aspirin 325 mg orally every day  Patient counseled to be compliant with his antithrombotic medications  Ongoing aggressive stroke risk factor management  Therapy recommendations:  No ST needs, PT and OT pending   Disposition:  Anticipate return home  Likely [facility] for discharge home after TEE from stroke standpoint  followup Dr. Leonie Man in 2 months. Order placed.  Hypertension  Improving  Hyperlipidemia  Home meds:  NO STATIN  LDL 145, goal < 70  Added new lipitor 40  Continue statin at discharge  Other Stroke Risk Factors  Cigarette smoker, advised to stop smoking  UDS positive for opitates and THC  ETOH use  Other Active Problems  ARF  Rhabdomyolysis  Hypothyroidism  Other significant Hx  Hx Hodgkin's disease  with neck/chest XRT  Hx Thyroid Cancer with neck/chest XRT  Hx Barrett's esophagus  Hx  asthma  History of bipolar disorder/polysubstance Drug use  Hospital day # [place] for Pager information 07/18/2015 8:25 AM  I have personally examined this patient, reviewed notes, independently viewed imaging studies, participated in medical decision making and plan of care. I have made any additions or clarifications directly to the above note. Agree with note above.  Check TEE and CT angiogram today  Antony Contras, MD Medical Director [facility] Pager: [phone] 07/18/2015 2:19 PM    To contact Stroke Continuity provider, please refer to [email or web address] After hours, contact General Neurology

## 2015-07-18 NOTE — CV Procedure (Signed)
Transesophageal Echocardiogram Note  Troy [name] [id number] 1976-08-30  Procedure: Transesophageal Echocardiogram Indications: CVA  Procedure Details Consent: Obtained Time Out: Verified patient identification, verified procedure, site/side was marked, verified correct patient position, special equipment/implants available, Radiology Safety Procedures followed,  medications/allergies/relevent history reviewed, required imaging and test results available.  Performed  Medications: Propofol drip / boluses total of 600 mg for the case   Left Ventrical:  Moderately reduced LV function.  EF 40%  Mitral Valve: normal   Aortic Valve: mild - moderate AI   Tricuspid Valve: mild TR   Pulmonic Valve: no PI   Left Atrium/ Left atrial appendage: seen in multple views,  No thrombus  Atrial septum: no right to left shunting by color flow or bubble contrast   Aorta: mild plaque in the descending aorta, just below the arch.   Somewhat mobile   Complications: No apparent complications Patient did tolerate procedure well.   [name], Montez Hageman., MD, [facility] 07/18/2015, 3:26 PM

## 2015-07-18 NOTE — Evaluation (Signed)
Occupational Therapy Evaluation and Discharge Patient Details Name: Troy [name] MRN: [id number] DOB: [birth year] Today's Date: 07/18/2015    History of Present Illness pt presents with c/o R sided weakness and MRI showing R Frontal and L Parietal "tiny punctate infarcts".  pt with hx of Hodgkins Lymphoma, Thyroid CA, Bipolar, HTN, and Polysubstance.     Clinical Impression   This 39 yo male admitted with above presents to acute OT with minimal deficits in RUE (compare to when he came in per his report). No further OT needs, we will sign off.    Follow Up Recommendations  No OT follow up          Precautions / Restrictions Precautions Precautions: None Restrictions Weight Bearing Restrictions: No      Mobility Bed Mobility Overal bed mobility: Independent                Transfers Overall transfer level: Independent Equipment used: None                  Balance Overall balance assessment: Independent                                          ADL Overall ADL's : Modified independent (moving slower than he normally would)                                             Vision Additional Comments: No change. Intact visual fields and pursuits          Pertinent Vitals/Pain Pain Assessment: No/denies pain     Hand Dominance Right   Extremity/Trunk Assessment Upper Extremity Assessment Upper Extremity Assessment: RUE deficits/detail RUE Deficits / Details: mild decrease in coordination; however can functionally use it to manipulate multiple loose keys in hand (did have trouble picking them up, had to slide them off the table), could tie his tennis shoe, and write his name legibly. RUE Sensation:  (He does report that his right hand feels different than is left, but he is able to determine with eyes shut which finger I am touching and he can determine hot from cold) RUE Coordination: decreased fine motor      Communication Communication Communication: No difficulties   Cognition Arousal/Alertness: Awake/alert Behavior During Therapy: WFL for tasks assessed/performed Overall Cognitive Status: Within Functional Limits for tasks assessed                        Exercises   Other Exercises Other Exercises: I encouraged pt to use, use, use his right UE (gave him some ideas on FM activites to work on as well). Encourged him at least in the short term to check water temperatures with his left hand (even though he could tell hot/cold with me with his right hand)        Home Living Family/patient expects to be discharged to:: Private residence Living Arrangements: Other relatives Available Help at Discharge: Family;Available PRN/intermittently Type of Home: House Home Access: Stairs to enter Entrance Stairs-Number of Steps: 3   Home Layout: Two level (lives in basement) Alternate Level Stairs-Number of Steps: flight Alternate Level Stairs-Rails: Left Bathroom Shower/Tub: Tub/shower unit;Curtain (upstairs) Shower/tub characteristics: Curtain       Home Equipment: None  Additional Comments: pt lives with his brother and sister-in-law who work.  pt stays in their basement at this time, but there is some question about whether he was supposed to be moving out of their basement this week.    Lives With: Family    Prior Functioning/Environment Level of Independence: Independent        Comments: Works as Community education officer.     OT Diagnosis: Generalized weakness         OT Goals(Current goals can be found in the care plan section) Acute Rehab OT Goals Patient Stated Goal: "When can I get back to power lifting?"  OT Frequency:                End of Session Equipment Utilized During Treatment:  (none)  Activity Tolerance: Patient tolerated treatment well Patient left:  (sitting EOB)   Time: 1884-1660 OT Time Calculation (min): 26 min Charges:  OT General Charges $OT  Visit: 1 Procedure OT Evaluation $Initial OT Evaluation Tier I: 1 Procedure OT Treatments $Therapeutic Activity: 8-22 mins  Evette Georges [phone] 07/18/2015, 10:23 AM

## 2015-07-18 NOTE — Interval H&P Note (Signed)
History and Physical Interval Note:  07/18/2015 2:09 PM  Troy [name]  has presented today for surgery, with the diagnosis of STROKE  The various methods of treatment have been discussed with the patient and family. After consideration of risks, benefits and other options for treatment, the patient has consented to  Procedure(s): TRANSESOPHAGEAL ECHOCARDIOGRAM (TEE) (N/A) as a surgical intervention .  The patient's history has been reviewed, patient examined, no change in status, stable for surgery.  I have reviewed the patient's chart and labs.  Questions were answered to the patient's satisfaction.     Darwin Rothlisberger, Wonda Cheng

## 2015-07-18 NOTE — Progress Notes (Signed)
Called CT to notify that patient has an IV 20 gauge at the right upper arm.

## 2015-07-18 NOTE — Progress Notes (Signed)
Patient left the unit at this time.

## 2015-07-18 NOTE — H&P (View-Only) (Signed)
STROKE TEAM PROGRESS NOTE   HISTORY Troy [name] is an 39 y.o. male history of Hodgkin's disease, thyroid cancer, Barrett's esophagus, GI bleed, pulmonary fibrosis hypertension and drug abuse, presenting with new onset speech output difficulty as well as complained of weakness and numbness involving right extremities. Onset of symptoms was at 9:30 PM tonight 07/16/2015 ([facility]). He has no previous history of stroke nor TIA. CT scan of his head showed no acute intracranial abnormality. MRI showed tiny punctate foci of high signal intensity on DWI sequence within cortical gray matter of the right frontal lobe and left parietal lobe, possibly acute ischemic infarction. Patient was not administered TPA secondary to no clear objective focal deficits. He was admitted for further evaluation and treatment.   SUBJECTIVE (INTERVAL HISTORY) No family is at the bedside.  Overall he feels his condition is stable. He power lifts competitively. He squated on Sunday 560# and deadlifted 635#   OBJECTIVE Temp:  [98 F (36.7 C)-98.5 F (36.9 C)] 98.5 F (36.9 C) (10/06 0755) Pulse Rate:  [97-123] 97 (10/06 0755) Cardiac Rhythm:  [-] Normal sinus rhythm (10/06 0700) Resp:  [10-23] 18 (10/06 0755) BP: (142-183)/(75-112) 180/112 mmHg (10/06 0755) SpO2:  [95 %-99 %] 96 % (10/06 0911) Weight:  [24.1 kg (53 lb 2.1 oz)] 24.1 kg (53 lb 2.1 oz) (10/06 0403)  CBC:  Recent Labs Lab 07/17/15 0024 07/17/15 0035  NEUTROABS  --  8.7*  HGB 17.3*  --   HCT 51.0  --     Basic Metabolic Panel:  Recent Labs Lab 07/17/15 0024 07/17/15 0035  NA 137 134*  K 4.1 4.1  CL 105 100*  CO2  --  21*  GLUCOSE 166* 156*  BUN 12 9  CREATININE 1.60* 1.63*  CALCIUM  --  8.9    Lipid Panel:    Component Value Date/Time   CHOL 210* 07/17/2015 0440   TRIG 146 07/17/2015 0440   HDL 36* 07/17/2015 0440   CHOLHDL 5.8 07/17/2015 0440   VLDL 29 07/17/2015 0440   LDLCALC 145* 07/17/2015 0440   HgbA1c: No results found for:  HGBA1C Urine Drug Screen:    Component Value Date/Time   LABOPIA NONE DETECTED 07/17/2015 0232   LABOPIA NEGATIVE 12/10/2013 2132   COCAINSCRNUR NONE DETECTED 07/17/2015 0232   LABBENZ NONE DETECTED 07/17/2015 0232   LABBENZ NEGATIVE 12/10/2013 2132   AMPHETMU NONE DETECTED 07/17/2015 0232   AMPHETMU NEGATIVE 12/10/2013 2132   THCU POSITIVE* 07/17/2015 0232   THCU NEGATIVE 12/10/2013 2132   LABBARB NONE DETECTED 07/17/2015 0232   LABBARB NEGATIVE 12/10/2013 2132      IMAGING  Dg Chest 2 View 07/17/2015   Minimal curvilinear scarring or atelectasis in the left base.     Ct Head Wo Contrast 07/17/2015   Unremarkable noncontrast CT of the head.    Mr Brain Wo Contrast 07/17/2015   1. Few tiny punctate foci of high signal intensity on DWI sequence within the cortical gray matter of the right frontal lobe and left parietal lobe as detailed above. These are favored to be artifactual in nature, although possible tiny acute ischemic infarcts are not entirely excluded, and could be considered if there is strong clinical suspicion for acute ischemia. While no definite ADC correlate seen with these foci, these are felt to be too small to resolve on corresponding ADC map. 2. Otherwise normal brain MRI.      PHYSICAL EXAM  Young well-built Caucasian male not in distress. . Afebrile. Head is nontraumatic.  Neck is supple without bruit.    Cardiac exam no murmur or gallop. Lungs are clear to auscultation. Distal pulses are well felt. Neurological Exam :  Awake alert oriented 3. Diminished verbal output and expressive language difficulties with nonfluent speech. Good comprehension. Able to name and repeat well. Extraocular movements are pharyngeal or nystagmus. Blinks to threat bilaterally. Fundi were not visualized. Vision acuity seems adequate. Face face appears mostly symmetric with slight nasolabial fold asymmetry when he smiles only. Tongue midline. Motor system exam no upper or lower extremity  drift but subjective weakness of right grip and intrinsic hand muscles as well as right hip flexors and ankle dorsiflexors. Mild subjective decrease right hemibody sensation. Coordination slow but accurate. Reflexes 1+ symmetric. Plantars downgoing. Gait was not tested. ASSESSMENT/PLAN [name] is a 39 y.o. male with history of hodgkin's disease, thyroid cancer, Barrett's esophagus, drug abuse presenting with new onset speech difficulty and right-sided weakness and numbness. He did not receive IV t-PA due to no clear deficits.   Stroke:  Right frontal and left parietal lobe tiny punctate infarcts, embolic secondary to unknown source  Resultant  R paresthesias, R hemiparesis, non-fluent expressive aphasia w/ robotic speech  MRI  Right frontal and left parietal lobe tiny punctate infarcts  Needs imaging to look at cerebral vasculature. Will hydrate and follow up creatiine tomorrow  Carotid Doppler  pending   2D Echo  pending  TEE to look for embolic source. Arranged with [facility] for tomorrow.  If positive for PFO (patent foramen ovale), check bilateral lower extremity venous dopplers to rule out DVT as possible source of stroke. (I have made patient NPO after midnight tonight).   LDL 145  HgbA1c pending  Heparin 5000 units sq tid for VTE prophylaxis. Will change to lovenox  Diet Heart Room service appropriate?: Yes; Fluid consistency:: Thin  no antithrombotic prior to admission, now on aspirin 325 mg orally every day  Patient counseled to be compliant with his antithrombotic medications  Ongoing aggressive stroke risk factor management  Therapy recommendations:  pending   Disposition:  pending   Hypertension  elevated Permissive hypertension (OK if < 220/120) but gradually normalize in 5-7 days  Hyperlipidemia  Home meds:  NO STATIN  LDL 145, goal < 70  Added new lipitor 40  Continue statin at discharge  Other Stroke Risk  Factors  Cigarette smoker, advised to stop smoking  UDS positive for opitates and THC  ETOH use  Other Active Problems  ARF  Rhabdomyolysis  Hypothyroidism  Other significant Hx  Hx Hodgkin's disease  Hx Thyroid Cancer  Hx Barrett's esophagus  Hx  asthma  History of bipolar disorder/polysubstance Drug use  Hospital day # 0  Radene Journey [facility] for Pager information 07/17/2015 10:50 AM  I have personally examined this patient, reviewed notes, independently viewed imaging studies, participated in medical decision making and plan of care. I have made any additions or clarifications directly to the above note. Agree with note above. He presented with expressive language difficulties and right-sided weakness and numbness due to tiny bi-cerebral embolic infarcts in etiology to be determined. He remains at risk for neurological worsening, recurrent stroke, TIA and needs ongoing evaluation. I recommend check checking a TEE for PFO and lower extremity venous Dopplers for DVT as well as lab work for vasculitis and hypercoagulable panel. Start aspirin for now. Patient was counseled to quit smoking cigarettes as well as marijuana.  Antony Contras,  MD Medical Director [facility] Pager: [phone] 07/17/2015 5:15 PM    To contact Stroke Continuity provider, please refer to [email or web address] After hours, contact General Neurology

## 2015-07-18 NOTE — Progress Notes (Signed)
  Echocardiogram Echocardiogram Transesophageal has been performed.  Troy [name] 07/18/2015, 3:32 PM

## 2015-07-18 NOTE — Anesthesia Postprocedure Evaluation (Signed)
  Anesthesia Post-op Note  Patient: Troy [name]  Procedure(s) Performed: Procedure(s) (LRB): TRANSESOPHAGEAL ECHOCARDIOGRAM (TEE) (N/A)  Patient Location: PACU  Anesthesia Type: MAC  Level of Consciousness: awake and alert   Airway and Oxygen Therapy: Patient Spontanous Breathing  Post-op Pain: mild  Post-op Assessment: Post-op Vital signs reviewed, Patient's Cardiovascular Status Stable, Respiratory Function Stable, Patent Airway and No signs of Nausea or vomiting  Last Vitals:  Filed Vitals:   07/18/15 1539  BP: 146/102  Pulse: 105  Temp: 36.5 C  Resp: 22    Post-op Vital Signs: stable   Complications: No apparent anesthesia complications

## 2015-07-18 NOTE — Transfer of Care (Signed)
Immediate Anesthesia Transfer of Care Note  Patient: Troy [name]  Procedure(s) Performed: Procedure(s): TRANSESOPHAGEAL ECHOCARDIOGRAM (TEE) (N/A)  Patient Location: Endoscopy Unit  Anesthesia Type:MAC  Level of Consciousness: awake, alert  and oriented  Airway & Oxygen Therapy: Patient Spontanous Breathing and Patient connected to nasal cannula oxygen  Post-op Assessment: Report given to RN and Post -op Vital signs reviewed and stable  Post vital signs: Reviewed and stable  Last Vitals:  Filed Vitals:   07/18/15 1355  BP: 172/112  Pulse: 109  Temp:   Resp: 17    Complications: No apparent anesthesia complications

## 2015-07-18 NOTE — Discharge Instructions (Signed)
Follow with Primary MD  No PCP Per Patient  and Dr Leonie Man (Neurologist) as instructed your Hospitalist MD  Please ask your PCP to check CK level and then initiate statins if levels ok, also please ask your PCP to follow hypercoagulable and autoimmune panel that are pending  Please get a complete blood count and chemistry panel checked by your Primary MD at your next visit, and again as instructed by your Primary MD.  Follow cardiac low salt diet  Get Medicines reviewed and adjusted. Please take all your medications with you for your next visit with your Primary MD  Please request your Primary MD to go over all hospital tests and procedure/radiological results at the follow up, please ask your Primary MD to get all Hospital records sent to his/her office.  If you experience worsening of your admission symptoms, develop shortness of breath, life threatening emergency, suicidal or homicidal thoughts you must seek medical attention immediately by calling 911 or calling your MD immediately  if symptoms less severe.  You must read complete instructions/literature along with all the possible adverse reactions/side effects for all the Medicines you take and that have been prescribed to you. Take any new Medicines after you have completely understood and accpet all the possible adverse reactions/side effects.   Do not drive when taking Pain medications.   Do not take more than prescribed Pain, Sleep and Anxiety Medications  Special Instructions: If you have smoked or chewed Tobacco  in the last 2 yrs please stop smoking, stop any regular Alcohol  and or any Recreational drug use.  Wear Seat belts while driving.  Please note  You were cared for by a hospitalist during your hospital stay. Once you are discharged, your primary care physician will handle any further medical issues. Please note that NO REFILLS for any discharge medications will be authorized once you are discharged, as it is imperative  that you return to your primary care physician (or establish a relationship with a primary care physician if you do not have one) for your aftercare needs so that they can reassess your need for medications and monitor your lab values.

## 2015-07-18 NOTE — Discharge Summary (Addendum)
Physician Discharge Summary  Troy [name]:[id number] DOB: 1976-05-14 DOA: 07/16/2015  PCP: No PCP Per Patient  Admit date: 07/16/2015 Discharge date: 07/18/2015  Time spent: 35 minutes  Recommendations for Outpatient Follow-up:  1. FU with PCP for the following:  1. Hyperlipidemia identified during hospitalization (LDL 145). Initiate statin statins once CK normalizes.  2. Rhabdomyolysis on admission. Please repeat CK at next visit and if normalized-please initiate statins 3. Follow-up of hypercoagulable studies/autoimmune work up  that were pending on discharge.  4. Psychiatric conditions (substance abuse, bipolar- currently off all medications)-please ensure follow up with psych   Discharge Diagnoses:  Principal Problem:   Stroke [facility]) Active Problems:   Substance induced mood disorder (HCC)   Cerebrovascular accident (CVA) (HCC)   Discharge Condition: Stable for discharge   Diet recommendation: None  Filed Weights   07/17/15 0403 07/17/15 1000  Weight: 24.1 kg (53 lb 2.1 oz) 113.399 kg (250 lb)    History of present illness:  Troy [name] is a 39 y.o. male with h/o hodgkin's disease, thyroid cancer, Barrett's esophagus, drug abuse. Patient presented to the ED on 07/17/15 with c/o new onset speech output difficulty as well as weakness and numbness involving right extremities. Onset of symptoms was at 9:30 PM on 07/16/15. He has no previous history of stroke nor TIA. CT scan of his head showed no acute intracranial abnormality. MRI was inconclusive. Patient was admitted for additional workup of symptoms   Hospital Course:  Principal Problem: Acute CVA: Patient presented with mild right sided deficits. CT showed no acute abnormalities. MRI revealed few tiny punctate foci (questional infarct vs. Artifact), uncertain if presenting deficits correlate with MRI findings. Echocardiogram revealed EF of 45-50% with no embolic source identified. TEE completed and no embolic source  identified. CTA Neck/head neg for significant stenosis.LDL 145 (see below), HgBA1c 5.6. Neurology following, initiated ASA on admission and Hypercoagulable/autoimmune work up (pending at time of discharge) due to patient history. Continue aspirin on discharge, please resume statins once CK has normalized  Active Problems:  ARF: Suspect from mild Rhabdomyolysis, Resolved with IVF.  Hyperlipidemia: LDL 145 (post CVA goal < 70). Initiate statins when CK has normalized.  Rhabdomyolysis: Etiology unknown, possibly from being down in his car for a prolonged period of time prior to admission or from weight lifting/excercise(per patient he exercises regularly). Improving with IVF. Please check CK levels at follow up with PCP-have asked patient to make his PCP aware of this.   Hypothyroidism: Controlled at home with synthroid. No exacerbations during hospitalization. Continue Synthroid on discharge.  HTN:non compliant to medications in the past-claims he was on lisinopril, given ARF-will avoid Lisinopril. Will start on Amlodipine and defer further optimization to the outpatient setting.  Hx of Papillary Ca of Thyroid: Followed at [facility]. Continue PRN follow-up on discharge.   Hx of Lymphoma: Remote hx-when he was a child-suspect in remission or cured.Continue PRN FU on discharge.   Hx of Bipolar disorder/Polysubstance use: Claims he stopped taking his psych meds a few months back. Claims he has been "clean" for 18 months. Psych consulted, does not meed inpatient admission criteria. Recommend outpatient supportive therapy for ongoing stressors on discharge.   Asthma: Stable, chronic. No exacerbations during hospitalization. Continue at home therapy on discharge.   Procedures:  TEE  Consultations:  Neurology   Psychiatry   Discharge Exam: Filed Vitals:   07/18/15 1634  BP: 152/93  Pulse: 107  Temp: 98.5 F (36.9 C)  Resp: 20    General:  Appears stated age, laying in bed with no acute  distress  Cardiovascular: RRR, no m/r/g. No peripheral edema  Respiratory: CTA b/l, no wheeze or crackles  Abdomen: Soft, non-tender, non-distended. +BS Neuro: A&Ox3, mild weakness in left UE but improved from admission. No other focal neurological deficits.  Skin: No rashes, wounds, or lesions.   Discharge Instructions   Discharge Instructions    Ambulatory referral to Neurology    Complete by:  As directed   Please schedule post stroke follow up in 2 months.     Diet - low sodium heart healthy    Complete by:  As directed      Increase activity slowly    Complete by:  As directed           Current Discharge Medication List    START taking these medications   Details  amLODipine (NORVASC) 5 MG tablet Take 1 tablet (5 mg total) by mouth daily. Qty: 60 tablet, Refills: 0    aspirin 325 MG tablet Take 1 tablet (325 mg total) by mouth daily. Qty: 30 tablet, Refills: 0      CONTINUE these medications which have NOT CHANGED   Details  albuterol (PROVENTIL HFA;VENTOLIN HFA) 108 (90 BASE) MCG/ACT inhaler Inhale 2 puffs into the lungs every 6 (six) hours as needed. For shortness of breath.    levothyroxine (SYNTHROID, LEVOTHROID) 112 MCG tablet Take 224 mcg by mouth daily.    Omeprazole-Sodium Bicarbonate (ZEGERID) 20-1100 MG CAPS Take 1 capsule by mouth daily before breakfast.    Fluticasone-Salmeterol (ADVAIR) 250-50 MCG/DOSE AEPB Inhale 1 puff into the lungs every 12 (twelve) hours.       Allergies  Allergen Reactions  . Ciprofloxacin Other (See Comments)    Spontaneous rupture of tendon  . Earnestine Leys Officinalis] Anaphylaxis   Follow-up Information    Follow up with SETHI,PRAMOD, MD.   Specialties:  Neurology, Radiology   Why:  office will call you with a appointment in the next few days. If you do not hear from them, please given them a call.   Contact information:   [address] [place] [address] [phone]       Schedule an appointment  as soon as possible for a visit in 2 weeks to follow up.   Why:  please ask your PCP to check CK level and then initiate statins if levels ok, also please ask your PCP to follow hypercoagulable and autoimmune panel that are pending   Contact information:   PCP of your choice       The results of significant diagnostics from this hospitalization (including imaging, microbiology, ancillary and laboratory) are listed below for reference.    Significant Diagnostic Studies: Ct Angio Head W/cm &/or Wo Cm  07/18/2015   CLINICAL DATA:  New onset of abnormal speech with weakness and numbness in the right upper and lower extremity. Punctate acute infarcts evident on brain MRI.  EXAM: CT ANGIOGRAPHY HEAD AND NECK  TECHNIQUE: Multidetector CT imaging of the head and neck was performed using the standard protocol during bolus administration of intravenous contrast. Multiplanar CT image reconstructions and MIPs were obtained to evaluate the vascular anatomy. Carotid stenosis measurements (when applicable) are obtained utilizing NASCET criteria, using the distal internal carotid diameter as the denominator.  CONTRAST:  OMNIPAQUE IOHEXOL 350 MG/ML SOLN  COMPARISON:  MRI brain without contrast 07/17/2015.  FINDINGS: CT HEAD  Brain: The small cortical infarcts are below the resolution of CT. No acute  cortical infarct, hemorrhage, or mass lesion is evident.  Calvarium and skull base: Within normal limits  Paranasal sinuses: Cleared  Orbits: Unremarkable  CTA NECK  Aortic arch: A 3 vessel arch configuration is present. There is no significant atherosclerotic disease or stenosis at the aortic arch.  Right carotid system: The right common carotid artery is within normal limits. The bifurcation is unremarkable. Cervical right ICA is normal.  Left carotid system: The left common carotid artery is within normal limits. Mild atherosclerotic changes are present at the bifurcation without a significant stenosis. The cervical  left ICA is normal.  Vertebral arteries:The vertebral arteries originate from the subclavian arteries without significant stenosis. There is no significant stenosis of the vertebral arteries in the neck.  Skeleton: No focal lytic or blastic lesions are present.  Other neck: The thyroid is surgically absent. No significant adenopathy is present. Salivary glands are within normal limits. No focal mucosal or submucosal lesions are present. The lung apices are clear.  CTA HEAD  Anterior circulation: Mild irregularity is present within the cavernous internal carotid arteries bilaterally, slightly worse on the right. There is no significant stenosis relative to the more distal vessels. The left A1 segment is dominant. The anterior communicating artery is patent. MCA bifurcations are intact. Mild distal small vessel irregularity is present without a significant proximal stenosis or occlusion. Similar findings are present in the distal ACA branch vessels.  Posterior circulation: The vertebral arteries are codominant. The left PICA origin is visualized and normal. A prominent right AICA vessel is present. The basilar artery is intact. Both posterior cerebral arteries originate from the basilar tip. The proximal posterior cerebral arteries are within normal limits. There is some irregularity of distal small vessels.  Venous sinuses: The dural sinuses are patent. The right transverse sinus is dominant. Straight sinus and deep cerebral veins are intact. Cortical veins are within normal limits.  Anatomic variants: None  Delayed phase: No pathologic enhancement is evident.  IMPRESSION: 1. Minimal atherosclerotic disease within the left carotid bifurcation without significant stenosis. 2. Minimal atherosclerotic changes within the cavernous internal carotid arteries, slightly worse on the right. 3. Mild distal small vessel disease without significant proximal stenosis, aneurysm, or branch vessel occlusion. 4. Thyroidectomy    Electronically Signed   By: Marin Roberts M.D.   On: 07/18/2015 13:48   Dg Chest 2 View  07/17/2015   CLINICAL DATA:  Tachycardia and right-sided weakness, onset tonight.  EXAM: CHEST  2 VIEW  COMPARISON:  None.  FINDINGS: Minimal linear scarring or atelectasis is present in the left base. The lungs are otherwise clear. There is no pleural effusion. The pulmonary vasculature is normal. Hilar and mediastinal contours are unremarkable.  IMPRESSION: Minimal curvilinear scarring or atelectasis in the left base.   Electronically Signed   By: Ellery Plunk M.D.   On: 07/17/2015 00:40   Ct Head Wo Contrast  07/17/2015   CLINICAL DATA:  Acute onset of right-sided weakness and right-sided facial droop. Slurred speech. Code stroke. Initial encounter.  EXAM: CT HEAD WITHOUT CONTRAST  TECHNIQUE: Contiguous axial images were obtained from the base of the skull through the vertex without intravenous contrast.  COMPARISON:  CT of the head performed 08/15/2014  FINDINGS: There is no evidence of acute infarction, mass lesion, or intra- or extra-axial hemorrhage on CT.  The posterior fossa, including the cerebellum, brainstem and fourth ventricle, is within normal limits. The third and lateral ventricles, and basal ganglia are unremarkable in appearance. The cerebral hemispheres are  symmetric in appearance, with normal gray-white differentiation. No mass effect or midline shift is seen.  There is no evidence of fracture; visualized osseous structures are unremarkable in appearance. The orbits are within normal limits. The paranasal sinuses and mastoid air cells are well-aerated. No significant soft tissue abnormalities are seen.  IMPRESSION: Unremarkable noncontrast CT of the head.  These results were called by telephone at the time of interpretation on 07/17/2015 at 12:01 am to Dr. Roseanne Reno, who verbally acknowledged these results.   Electronically Signed   By: Roanna Raider M.D.   On: 07/17/2015 00:03   Ct Angio  Neck W/cm &/or Wo/cm  07/18/2015   CLINICAL DATA:  New onset of abnormal speech with weakness and numbness in the right upper and lower extremity. Punctate acute infarcts evident on brain MRI.  EXAM: CT ANGIOGRAPHY HEAD AND NECK  TECHNIQUE: Multidetector CT imaging of the head and neck was performed using the standard protocol during bolus administration of intravenous contrast. Multiplanar CT image reconstructions and MIPs were obtained to evaluate the vascular anatomy. Carotid stenosis measurements (when applicable) are obtained utilizing NASCET criteria, using the distal internal carotid diameter as the denominator.  CONTRAST:  OMNIPAQUE IOHEXOL 350 MG/ML SOLN  COMPARISON:  MRI brain without contrast 07/17/2015.  FINDINGS: CT HEAD  Brain: The small cortical infarcts are below the resolution of CT. No acute cortical infarct, hemorrhage, or mass lesion is evident.  Calvarium and skull base: Within normal limits  Paranasal sinuses: Cleared  Orbits: Unremarkable  CTA NECK  Aortic arch: A 3 vessel arch configuration is present. There is no significant atherosclerotic disease or stenosis at the aortic arch.  Right carotid system: The right common carotid artery is within normal limits. The bifurcation is unremarkable. Cervical right ICA is normal.  Left carotid system: The left common carotid artery is within normal limits. Mild atherosclerotic changes are present at the bifurcation without a significant stenosis. The cervical left ICA is normal.  Vertebral arteries:The vertebral arteries originate from the subclavian arteries without significant stenosis. There is no significant stenosis of the vertebral arteries in the neck.  Skeleton: No focal lytic or blastic lesions are present.  Other neck: The thyroid is surgically absent. No significant adenopathy is present. Salivary glands are within normal limits. No focal mucosal or submucosal lesions are present. The lung apices are clear.  CTA HEAD  Anterior  circulation: Mild irregularity is present within the cavernous internal carotid arteries bilaterally, slightly worse on the right. There is no significant stenosis relative to the more distal vessels. The left A1 segment is dominant. The anterior communicating artery is patent. MCA bifurcations are intact. Mild distal small vessel irregularity is present without a significant proximal stenosis or occlusion. Similar findings are present in the distal ACA branch vessels.  Posterior circulation: The vertebral arteries are codominant. The left PICA origin is visualized and normal. A prominent right AICA vessel is present. The basilar artery is intact. Both posterior cerebral arteries originate from the basilar tip. The proximal posterior cerebral arteries are within normal limits. There is some irregularity of distal small vessels.  Venous sinuses: The dural sinuses are patent. The right transverse sinus is dominant. Straight sinus and deep cerebral veins are intact. Cortical veins are within normal limits.  Anatomic variants: None  Delayed phase: No pathologic enhancement is evident.  IMPRESSION: 1. Minimal atherosclerotic disease within the left carotid bifurcation without significant stenosis. 2. Minimal atherosclerotic changes within the cavernous internal carotid arteries, slightly worse on the right.  3. Mild distal small vessel disease without significant proximal stenosis, aneurysm, or branch vessel occlusion. 4. Thyroidectomy   Electronically Signed   By: Marin Roberts M.D.   On: 07/18/2015 13:48   Mr Brain Wo Contrast  07/17/2015   CLINICAL DATA:  The initial evaluation for sudden onset aphasia with associated right-sided weakness and numbness.  EXAM: MRI HEAD WITHOUT CONTRAST  TECHNIQUE: Multiplanar, multiecho pulse sequences of the brain and surrounding structures were obtained without intravenous contrast.  COMPARISON:  Prior CT from 07/16/2015.  FINDINGS: On axial DWI sequence, there is a subtle 5  mm focus of high signal intensity within the cortical gray matter of the right frontal lobe (series 4, image 41). No definite ADC correlate, although this focus is so small that this is felt to likely be difficult to discern on corresponding ADC map. Additionally, there is a subtle 9 mm focus of high signal intensity within the left parietal lobe on axial DWI sequence 38 additional possible punctate focus of restricted diffusion more cephalad within the left parietal lobe on axial image 44 no definite T2/FLAIR correlate seen about these lesions. These are favored to be artifactual in nature, although possible tiny foci of acute ischemia could be considered in the correct clinical setting. No other foci of restricted diffusion identified. Normal intravascular flow voids maintained. No acute or chronic intracranial hemorrhage.  Cerebral volume within normal limits for patient age. No significant white matter disease.  No mass lesion, midline shift, or mass effect. No hydrocephalus. No extra-axial fluid collection. Probable small amount of trapped fluid within the right petrous apex.  Craniocervical junction within normal limits. Incidental note made of a partially empty sella.  No acute abnormality about the orbits.  Mild mucosal thickening within the paranasal sinuses. No mastoid effusion. Inner ear structures normal.  Bone marrow signal intensity within normal limits. Scalp soft tissues unremarkable.  IMPRESSION: 1. Few tiny punctate foci of high signal intensity on DWI sequence within the cortical gray matter of the right frontal lobe and left parietal lobe as detailed above. These are favored to be artifactual in nature, although possible tiny acute ischemic infarcts are not entirely excluded, and could be considered if there is strong clinical suspicion for acute ischemia. While no definite ADC correlate seen with these foci, these are felt to be too small to resolve on corresponding ADC map. 2. Otherwise normal  brain MRI.   Electronically Signed   By: Rise Mu M.D.   On: 07/17/2015 02:21    Microbiology: No results found for this or any previous visit (from the past 240 hour(s)).   Labs: Basic Metabolic Panel:  Recent Labs Lab 07/17/15 0024 07/17/15 0035 07/18/15 0522  NA 137 134* 134*  K 4.1 4.1 3.9  CL 105 100* 97*  CO2  --  21* 26  GLUCOSE 166* 156* 86  BUN 12 9 9   CREATININE 1.60* 1.63* 1.11  CALCIUM  --  8.9 8.1*   Liver Function Tests:  Recent Labs Lab 07/17/15 0035  AST 75*  ALT 109*  ALKPHOS 69  BILITOT 0.6  PROT 7.3  ALBUMIN 3.7   No results for input(s): LIPASE, AMYLASE in the last 168 hours. No results for input(s): AMMONIA in the last 168 hours. CBC:  Recent Labs Lab 07/17/15 0024 07/17/15 0035  NEUTROABS  --  8.7*  HGB 17.3*  --   HCT 51.0  --    Cardiac Enzymes:  Recent Labs Lab 07/17/15 0035 07/17/15 0440 07/17/15 1346  CKTOTAL 1153* 905* 778*  CKMB  --  6.3*  --    BNP: BNP (last 3 results) No results for input(s): BNP in the last 8760 hours.  ProBNP (last 3 results) No results for input(s): PROBNP in the last 8760 hours.  CBG: No results for input(s): GLUCAP in the last 168 hours.  Signed:  Connye Burkitt PA-C  [facility] 07/18/2015, 4:51 PM  Attending MD note  Patient was seen, examined,treatment plan was discussed with the PA-S.  I have personally reviewed the clinical findings, lab, imaging studies and management of this patient in detail. I agree with the documentation, as recorded by the PA-S.   Patient improving-speech and right sided deficits better. TEE neg, CTA neck/head neg for major stenosis. CK downtrending. Spoke with Dr Pearlean Brownie over the phone-no further work up recommended. He will follow up hypercoagulable work up. Patient refused Case Management help in setting appointment at [facility]-he claims he will find a PCP of his choosing.Patient aware that he needs a CK level checked  at next visit with PCP before initiating statins. He is also aware that hypercoagulable/autoimmune panel is also pending and will need follow up with PCP.Both Neuro and Psych have no further recommendations-ok from Dr Pearlean Brownie to discharge. Will discharge on ASA-with plans to only initiate statins once CK has normalized. Patient with no PCP-?reliable follow up.   [facility]

## 2015-07-18 NOTE — Care Management Note (Signed)
Case Management Note  Patient Details  Name: Troy [name] MRN: [id number] Date of Birth: 1976/01/08  Subjective/Objective:                    Action/Plan: Patient admitted with CVA. Patient lives at home with family. CM met with patient as he denies having a PCP. CM offered the patient the [facility] and he refused. CM then provided the patient the [facility] information to find a PCP in his area with his [employer]. Patient expressed understanding. CM will continue to follow for discharge needs.   Expected Discharge Date:                  Expected Discharge Plan:  Home/Self Care  In-House Referral:     Discharge planning Services  CM Consult  Post Acute Care Choice:    Choice offered to:     DME Arranged:    DME Agency:     HH Arranged:    HH Agency:     Status of Service:  In process, will continue to follow  Medicare Important Message Given:    Date Medicare IM Given:    Medicare IM give by:    Date Additional Medicare IM Given:    Additional Medicare Important Message give by:     If discussed at [place] of Stay Meetings, dates discussed:    Additional Comments:  Pollie Friar, RN 07/18/2015, 10:16 AM

## 2015-07-18 NOTE — Evaluation (Signed)
Physical Therapy Evaluation Patient Details Name: Troy [name] MRN: [id number] DOB: May 25, 1976 Today's Date: 07/18/2015   History of Present Illness  pt presents with c/o R sided weakness and MRI showing R Frontal and L Parietal "tiny punctate infarcts".  pt with hx of Hodgkins Lymphoma, Thyroid CA, Bipolar, HTN, and Polysubstance.    Clinical Impression  Pt up independently and without deficit.  Pt concerned about when he can return to power lifting and advised that he speak with his doctor before returning to strenuous activity.  No further PT needs at this time, will sign off.      Follow Up Recommendations No PT follow up    Equipment Recommendations  None recommended by PT    Recommendations for Other Services       Precautions / Restrictions Precautions Precautions: None Restrictions Weight Bearing Restrictions: No      Mobility  Bed Mobility Overal bed mobility: Independent                Transfers Overall transfer level: Independent Equipment used: None                Ambulation/Gait Ambulation/Gait assistance: Modified independent (Device/Increase time) Ambulation Distance (Feet): 200 Feet Assistive device: None Gait Pattern/deviations: WFL(Within Functional Limits)     General Gait Details: pt moves slowly, but states he is tired and didn't sleep much last night.    Stairs Stairs: Yes Stairs assistance: Modified independent (Device/Increase time) Stair Management: One rail Left;Alternating pattern;Forwards Number of Stairs: 5 General stair comments: Limited on number of steps due to length of IV line.    Wheelchair Mobility    Modified Rankin (Stroke Patients Only) Modified Rankin (Stroke Patients Only) Pre-Morbid Rankin Score: No symptoms Modified Rankin: No significant disability     Balance Overall balance assessment: Independent                                           Pertinent Vitals/Pain Pain  Assessment: No/denies pain    Home Living Family/patient expects to be discharged to:: Private residence Living Arrangements: Other relatives Available Help at Discharge: Family;Available PRN/intermittently Type of Home: House Home Access: Stairs to enter   Entrance Stairs-Number of Steps: 3 Home Layout: Two level (Lives in basement) Home Equipment: None Additional Comments: pt lives with his brother and sister-in-law who work.  pt stays in their basement at this time, but there is some question about whether he was supposed to be moving out of their basement this week.      Prior Function Level of Independence: Independent         Comments: Works as Community education officer.      Hand Dominance        Extremity/Trunk Assessment   Upper Extremity Assessment: Defer to OT evaluation           Lower Extremity Assessment: Overall WFL for tasks assessed      Cervical / Trunk Assessment: Normal  Communication   Communication: No difficulties  Cognition Arousal/Alertness: Awake/alert Behavior During Therapy: WFL for tasks assessed/performed Overall Cognitive Status: Within Functional Limits for tasks assessed                      General Comments      Exercises        Assessment/Plan    PT Assessment Patent does not need any  further PT services  PT Diagnosis Difficulty walking   PT Problem List    PT Treatment Interventions     PT Goals (Current goals can be found in the Care Plan section) Acute Rehab PT Goals Patient Stated Goal: "When can I get back to power lifting?" PT Goal Formulation: With patient    Frequency     Barriers to discharge        Co-evaluation               End of Session   Activity Tolerance: Patient tolerated treatment well Patient left: in bed;with call bell/phone within reach (Sitting EOB) Nurse Communication: Mobility status         Time: 9147-8295 PT Time Calculation (min) (ACUTE ONLY): 12 min   Charges:   PT  Evaluation $Initial PT Evaluation Tier I: 1 Procedure     PT G CodesSunny Schlein, Universal [phone] 07/18/2015, 9:06 AM

## 2015-07-18 NOTE — Anesthesia Preprocedure Evaluation (Addendum)
Anesthesia Evaluation  Patient identified by MRN, date of birth, ID band Patient awake    Reviewed: Allergy & Precautions, H&P , NPO status , Patient's Chart, lab work & pertinent test results  History of Anesthesia Complications Negative for: history of anesthetic complications  Airway Mallampati: III  TM Distance: >3 FB Neck ROM: full    Dental  (+) Poor Dentition, Dental Advisory Given   Pulmonary Current Smoker,    Pulmonary exam normal breath sounds clear to auscultation       Cardiovascular hypertension, Pt. on medications Normal cardiovascular exam Rhythm:regular Rate:Normal     Neuro/Psych PSYCHIATRIC DISORDERS Bipolar Disorder Speech, good bilateral arm strength CVA, Residual Symptoms    GI/Hepatic GERD  ,(+)     substance abuse  ,   Endo/Other  History of thyroid cancer  Renal/GU negative Renal ROS     Musculoskeletal   Abdominal (+) + obese,   Peds  Hematology negative hematology ROS (+)   Anesthesia Other Findings CVA 36 hours prior  Reproductive/Obstetrics negative OB ROS                            Anesthesia Physical Anesthesia Plan  ASA: III  Anesthesia Plan: MAC   Post-op Pain Management:    Induction: Intravenous  Airway Management Planned: Nasal Cannula  Additional Equipment: None  Intra-op Plan:   Post-operative Plan:   Informed Consent: I have reviewed the patients History and Physical, chart, labs and discussed the procedure including the risks, benefits and alternatives for the proposed anesthesia with the patient or authorized representative who has indicated his/her understanding and acceptance.   Dental Advisory Given  Plan Discussed with: Anesthesiologist, CRNA and Surgeon  Anesthesia Plan Comments:         Anesthesia Quick Evaluation

## 2015-07-19 LAB — C3 COMPLEMENT: C3 Complement: 146 mg/dL (ref 82–167)

## 2015-07-19 LAB — C4 COMPLEMENT: COMPLEMENT C4, BODY FLUID: 25 mg/dL (ref 14–44)

## 2015-07-19 LAB — RPR: RPR Ser Ql: NONREACTIVE

## 2015-07-19 LAB — COMPLEMENT, TOTAL: COMPL TOTAL (CH50): 15 U/mL — AB (ref 42–60)

## 2015-07-19 LAB — HIV ANTIBODY (ROUTINE TESTING W REFLEX): HIV SCREEN 4TH GENERATION: NONREACTIVE

## 2015-07-20 LAB — HOMOCYSTEINE: Homocysteine: 17.1 umol/L — ABNORMAL HIGH (ref 0.0–15.0)

## 2015-07-21 ENCOUNTER — Encounter (HOSPITAL_COMMUNITY): Payer: Self-pay | Admitting: Cardiovascular Disease

## 2015-07-21 LAB — CARDIOLIPIN ANTIBODIES, IGG, IGM, IGA: Anticardiolipin IgG: <9 GPL U/mL (ref 0–14)

## 2015-07-21 LAB — ANTINUCLEAR ANTIBODIES, IFA: ANA Ab, IFA: NEGATIVE

## 2015-07-23 LAB — LUPUS ANTICOAGULANT PANEL
DRVVT: 40.7 s (ref 0.0–55.1)
PTT LA: 30.7 s (ref 0.0–50.0)

## 2015-07-24 LAB — PROTHROMBIN GENE MUTATION

## 2015-09-11 ENCOUNTER — Ambulatory Visit: Payer: No Typology Code available for payment source | Attending: Family Medicine | Admitting: Speech Pathology

## 2015-09-11 DIAGNOSIS — I69322 Dysarthria following cerebral infarction: Secondary | ICD-10-CM

## 2015-09-11 DIAGNOSIS — I69922 Dysarthria following unspecified cerebrovascular disease: Secondary | ICD-10-CM | POA: Insufficient documentation

## 2015-09-11 NOTE — Patient Instructions (Signed)
   SLOW LOUD OVER-ENNUNCIATE PAUSE  PA TA KA  PATA TAKA KAPA PATAKA  BUTTERCUP  CATERPILLAR  BASEBALLL PLAYER  [place]  [place] BUCCANEERS  SLOW AND BIG - EXAGGERATE YOUR MOUTH, MAKE EACH CONSONANT  Speech exercises - do 5x each, x2-3/day SLOW BIG  SAY THE FOLLOWING- make every sound! Red leather, yellow leather  Big grocery buggy    Purple baby carriage    [place] [employer]ers Proper copper coffee pot Ripe purple cabbage Three free throws [place] Ter[place]ins Red Bulb, Blue Bulb Flash Message Dave dipped the dessert  [employer]ils An Engineer for [employer] Five valve levers Six Thick Thistles Stick Double Bubble Gum Fat cows give milk [place] Golden Gophers Fat frogs flip freely Tuck Tommy into bed Get that game to Greg Freshly Fried Fat Fish Cinnamon aluminum linoleum Black bugs blood Lovely lemon linament Buckle that bracket  Tying Tape Takes Time A Shifty Salt Shaker   The gospel of Mark Shirts shrink, shells shouldn't [place] 49ers Take the tackle box Give me five flapjacks Fundamental relatives Call the cat "Buttercup" A calendar of [place] Four floors to cover Yellow oil ointment Fellow lovers of felines Catastrophe in [place] Plump plumbers' plums The church's chimes chimed Telling time until eleven Unique [place] A Three Toed Tree Toad Knapsack Strap Snap Rubber Baby Buggy Bumpers      

## 2015-09-11 NOTE — Therapy (Signed)
[facility] [address] [place], [place], [address] Phone: [phone]   Fax:  [phone]  Speech Language Pathology Evaluation  Patient Details  Name: Troy [name] MRN: [id number] Date of Birth: 04-21-1976 Referring Provider: Dr. Glendale Chard  Encounter Date: 09/11/2015      End of Session - 09/11/15 1011    Visit Number 1   Number of Visits 5   Date for SLP Re-Evaluation 10/23/15   SLP Start Time 0933      Past Medical History  Diagnosis Date  . Hodgkin disease ([place])   . Thyroid cancer ([place])   . Barrett's esophagus   . GI bleed   . Pulmonary fibrosis ([place])   . Hypertension   . Drug abuse   . Asthma     Past Surgical History  Procedure Laterality Date  . Spleenectomy    . Thyroidectomy    . Parathyroidectomy    . Tee without cardioversion N/A 07/18/2015    Procedure: TRANSESOPHAGEAL ECHOCARDIOGRAM (TEE);  Surgeon: Thayer Headings, MD;  Location: [facility];  Service: Cardiovascular;  Laterality: N/A;    There were no vitals filed for this visit.  Visit Diagnosis: Dysarthria due to recent stroke          SLP Evaluation [facility] - 09/11/15 0940    SLP Visit Information   SLP Received On 09/11/15   Referring Provider Dr. Glendale Chard   Onset Date 07/17/15   Medical Diagnosis CVA   Subjective   Subjective "My speech is messed up"   Patient/Family Stated Goal To get my speech better   General Information   Mobility Status walks independently   Prior Functional Status   Type of Home Apartment    Lives With Alone   Available Support Family   Vocation On disability   Cognition   Overall Cognitive Status Within Functional Limits for tasks assessed   Memory Impaired   Memory Impairment Decreased recall of new information   Oral Motor/Sensory Function   Overall Oral Motor/Sensory Function Impaired   Labial ROM Within Functional Limits   Lingual ROM Within Functional Limits   Facial Symmetry  Within Functional Limits   Motor Speech   Overall Motor Speech Impaired   Respiration Within functional limits   Phonation Other (comment)  vocal tremor   Resonance Within functional limits   Intelligibility Intelligible   Motor Planning Witnin functional limits   Motor Speech Errors Aware;Inconsistent   Phonation Impaired   Vocal Abuses --  tremor   Standardized Assessments   Standardized Assessments  [place] Cognitive Assessment [facility])                      ADULT SLP TREATMENT - 09/11/15 1110    Cognitive-Linquistic Treatment   Skilled Treatment Initiated training in HEP for dysarthria - pt demonstrated 90% accuracy completing HEP with usual min to mod verbal instructions and modeling by ST. Trained pt in compensations for dysarthria with rare min A.           SLP Education - 09/11/15 1010    Education provided Yes   Education Details Compensations for dysarthria   Person(s) Educated Patient   Methods Explanation;Demonstration;Handout   Comprehension Verbalized understanding;Returned demonstration;Verbal cues required            SLP Long Term Goals - 09/11/15 1107    SLP LONG TERM GOAL #1   Title Pt will utilize compensations for dysarthria in 8 minute conversation in  noisy environment with 100% intelligibility and mod I   Time 4   Period Weeks   Status New   SLP LONG TERM GOAL #2   Title Pt will perfrom HEP for dysarthira with mod I   Time 4   Period Weeks   Status New   SLP LONG TERM GOAL #3   Title Pt will report success using compensations during phone conversations outside of therapy over 2 visits   Time 4   Period Weeks   Status New          Plan - 09/11/15 1014    Clinical Impression Statement 39 y.o. male s/p CVA 07/17/15 presents today with mild inconsistent/atypical dysarthria. Pt is intelligible at conversation level, however speech is inconsistently halting with mild stutter. Vocal tremor and lingual tremor also noted.  Pt   reported some memory issues that he is compensating for  successfully. He scored a 30/30 on the [facility] Cognitive Assessment, which is WNL.  He recalled 5 words after significant delay. I recommend short term skilled ST to train pt in compensations for dysarthria as his job requires consistent phone calls, to maximize intelligibility and normalize speech for phone conversations. Pt reports right UE tremor and inconsistent difficulty with grooming and feeding. Please consider/order OT evaluation if in agreement.    Speech Therapy Frequency 1x /week   Duration 4 weeks   Treatment/Interventions SLP instruction and feedback;Functional tasks;Compensatory strategies;Patient/family education   Potential to Achieve Goals Good   Potential Considerations Family/community support   SLP Home Exercise Plan dysarthria HEP -    Consulted and Agree with Plan of Care Patient        Problem List Patient Active Problem List   Diagnosis Date Noted  . Stroke ([place]) 07/17/2015  . Substance induced mood disorder ([place])   . Cerebrovascular accident (CVA) ([place])     [place], Annye Rusk MS, [place] 09/11/2015, 11:12 AM  [facility] [address] [place], [place], [address] Phone: [phone]   Fax:  [phone]  Name: Troy [name] MRN: [id number] Date of Birth: January 02, 1976

## 2015-09-16 ENCOUNTER — Ambulatory Visit: Payer: No Typology Code available for payment source | Admitting: Speech Pathology

## 2015-09-16 ENCOUNTER — Telehealth: Payer: Self-pay

## 2015-09-16 ENCOUNTER — Encounter: Payer: Self-pay | Admitting: Neurology

## 2015-09-16 ENCOUNTER — Ambulatory Visit (INDEPENDENT_AMBULATORY_CARE_PROVIDER_SITE_OTHER): Payer: No Typology Code available for payment source | Admitting: Neurology

## 2015-09-16 VITALS — BP 116/82 | HR 86 | Ht 72.0 in | Wt 241.8 lb

## 2015-09-16 DIAGNOSIS — I639 Cerebral infarction, unspecified: Secondary | ICD-10-CM | POA: Diagnosis not present

## 2015-09-16 MED ORDER — ATORVASTATIN CALCIUM 20 MG PO TABS: 20.0000 mg | ORAL_TABLET | Freq: Every day | ORAL | Status: DC

## 2015-09-16 NOTE — Telephone Encounter (Signed)
I left a message for the patient to return my call.

## 2015-09-16 NOTE — Telephone Encounter (Signed)
I spoke to the patient in regards to the Respect-ESUS study. I told him that he will need to have a holter monitor placed after the Screening Visit on [id number] at 15:30h. The patient agreed and voiced understanding.

## 2015-09-16 NOTE — Telephone Encounter (Signed)
I spoke to the the patient in regards to the Respect-ESUS study. Patient stated that as of today he does not take medication for cancer or has any significant symptoms. He also denied GI bleed but stated that he had an ulcer due to radiation some years ago. The patient is interested in participating. Therefore, a screening visit was scheduled for Thursday, [id number] at 13:30h. Patient voiced understanding.

## 2015-09-16 NOTE — Patient Instructions (Signed)
I had a long d/w patient and mother about his recent stroke, risk for recurrent stroke/TIAs, personally independently reviewed imaging studies and stroke evaluation results and answered questions.Continue aspirin 325 mg daily  for secondary stroke prevention and maintain strict control of hypertension with blood pressure goal below 130/90,   and lipids with LDL cholesterol goal below 70 mg/dL. I have given him a prescription of Lipitor 20 mg daily and discussed possible side effects with him and his mother and answered questions. I also advised the patient to eat a healthy diet with plenty of whole grains, cereals, fruits and vegetables, exercise regularly and maintain ideal body weight .he was advised to follow-up with his primary care physician for optimal treatment for his depression symptoms. He was complimented on quitting marijuana and counseled to quit smoking completely as well. He may return to work next week. He was also given written information to review at home about possible participation in the [place] trial if interested. Followup in the future with me in 3 months or call earlier if necessary. Stroke Prevention Some medical conditions and behaviors are associated with an increased chance of having a stroke. You may prevent a stroke by making healthy choices and managing medical conditions. HOW CAN I REDUCE MY RISK OF HAVING A STROKE?   Stay physically active. Get at least 30 minutes of activity on most or all days.  Do not smoke. It may also be helpful to avoid exposure to secondhand smoke.  Limit alcohol use. Moderate alcohol use is considered to be:  No more than 2 drinks per day for men.  No more than 1 drink per day for nonpregnant women.  Eat healthy foods. This involves:  Eating 5 or more servings of fruits and vegetables a day.  Making dietary changes that address high blood pressure (hypertension), high cholesterol, diabetes, or obesity.  Manage your cholesterol  levels.  Making food choices that are high in fiber and low in saturated fat, trans fat, and cholesterol may control cholesterol levels.  Take any prescribed medicines to control cholesterol as directed by your health care provider.  Manage your diabetes.  Controlling your carbohydrate and sugar intake is recommended to manage diabetes.  Take any prescribed medicines to control diabetes as directed by your health care provider.  Control your hypertension.  Making food choices that are low in salt (sodium), saturated fat, trans fat, and cholesterol is recommended to manage hypertension.  Ask your health care provider if you need treatment to lower your blood pressure. Take any prescribed medicines to control hypertension as directed by your health care provider.  If you are 26-27 years of age, have your blood pressure checked every 3-5 years. If you are 36 years of age or older, have your blood pressure checked every year.  Maintain a healthy weight.  Reducing calorie intake and making food choices that are low in sodium, saturated fat, trans fat, and cholesterol are recommended to manage weight.  Stop drug abuse.  Avoid taking birth control pills.  Talk to your health care provider about the risks of taking birth control pills if you are over 50 years old, smoke, get migraines, or have ever had a blood clot.  Get evaluated for sleep disorders (sleep apnea).  Talk to your health care provider about getting a sleep evaluation if you snore a lot or have excessive sleepiness.  Take medicines only as directed by your health care provider.  For some people, aspirin or blood thinners (anticoagulants) are  helpful in reducing the risk of forming abnormal blood clots that can lead to stroke. If you have the irregular heart rhythm of atrial fibrillation, you should be on a blood thinner unless there is a good reason you cannot take them.  Understand all your medicine instructions.  Make  sure that other conditions (such as anemia or atherosclerosis) are addressed. SEEK IMMEDIATE MEDICAL CARE IF:   You have sudden weakness or numbness of the face, arm, or leg, especially on one side of the body.  Your face or eyelid droops to one side.  You have sudden confusion.  You have trouble speaking (aphasia) or understanding.  You have sudden trouble seeing in one or both eyes.  You have sudden trouble walking.  You have dizziness.  You have a loss of balance or coordination.  You have a sudden, severe headache with no known cause.  You have new chest pain or an irregular heartbeat. Any of these symptoms may represent a serious problem that is an emergency. Do not wait to see if the symptoms will go away. Get medical help at once. Call your local emergency services (911 in [place] Do not drive yourself to the hospital.   This information is not intended to replace advice given to you by your health care provider. Make sure you discuss any questions you have with your health care provider.   Document Released: 11/04/2004 Document Revised: 10/18/2014 Document Reviewed: 03/30/2013 Elsevier Interactive Patient Education [employer].

## 2015-09-16 NOTE — Progress Notes (Signed)
[facility] [address] [place]. [place] [address] [phone]       OFFICE FOLLOW-UP NOTE  [name] Date of Birth:  07/25/1976 Medical Record Number:  [id number]   HPI: 39 year Caucasian male with remote history of Hodgkin's lymphoma and thyroid cancer and asthma who is seen today for the first office follow-up visit following recent hospital admission for stroke in October 2016.[name] is an 39 y.o. male history of Hodgkin's disease, thyroid cancer, Barrett's esophagus, GI bleed, pulmonary fibrosis hypertension and drug abuse, presenting with new onset speech output difficulty as well as complained of weakness and numbness involving right extremities. Onset of symptoms was at 9:30 PM on 07/16/2015 ([facility]). He has no previous history of stroke nor TIA. CT scan of his head showed no acute intracranial abnormality. MRI showed tiny punctate foci of high signal intensity on DWI sequence within cortical gray matter of the right frontal lobe and left parietal lobe, possibly acute ischemic infarction. Patient was not administered TPA secondary to no clear objective focal deficits. He was admitted for further evaluation and treatment. CT scan of the head showed no acute abnormality MRI scan showed tiny punctate right frontal and left parietal infarcts. Transthoracic echo showed slightly diminished ejection fraction and subsequently an outpatient transesophageal echocardiogram was done on 07/18/15 which showed reduced EF of 40-45% but no definite cardiac source of embolism. There was a small mobile aortic arch plaque noted. Hemoglobin A1c was 5.6. LDL cholesterol was elevated at 145. Anticardiolipin antibodies, homocystine, lupus anticoagulant, HIV antibody, antithrombin III and complement levels were all normal. Patient was recommended to be on a statin but for unclear reason I do not see that on his medication list today. He states his done well he has quit marijuana as well  as smoking. He still notices some diminished fine motor skills and coordination is right hand. He does tend to be shaking his hand. His sensation seems to have improved. He has only occasional balance issues but has had no falls. At times he feels he has some trouble speaking when he gets excited or tries to talk quickly. He plans to go back to work next week. He has a long-standing history of depression and feels that he is having some increasing short-term memory and cognitive difficulties. He has been on Wellbutrin 300 mg for a long time and recently primary care physician added Zoloft 100 mg 2 weeks ago.  ROS:   14 system review of systems is positive for fatigue, weight loss, palpitations, cough, aching muscles, headache, tremor, depression, anxiety, decreased energy, change in appetite, insomnia, sleepiness and all other systems negative  PMH:  Past Medical History  Diagnosis Date  . Hodgkin disease (HCC)   . Thyroid cancer (HCC)   . Barrett's esophagus   . GI bleed   . Pulmonary fibrosis (HCC)   . Hypertension   . Drug abuse   . Asthma   . Stroke (HCC)   . Hodgkin's disease [facility])     Social History:  Social History   Social History  . Marital Status: Unknown    Spouse Name: N/A  . Number of Children: N/A  . Years of Education: N/A   Occupational History  . Not on file.   Social History Main Topics  . Smoking status: Former Smoker    Types: Cigarettes  . Smokeless tobacco: Not on file  . Alcohol Use: 0.6 oz/week    1 Cans of beer per week  Comment: occasionally  . Drug Use: Yes     Comment: heroin  . Sexual Activity: Not on file   Other Topics Concern  . Not on file   Social History Narrative    Medications:   Current Outpatient Prescriptions on File Prior to Visit  Medication Sig Dispense Refill  . albuterol (PROVENTIL HFA;VENTOLIN HFA) 108 (90 BASE) MCG/ACT inhaler Inhale 2 puffs into the lungs every 6 (six) hours as needed. For shortness of breath.      Marland Kitchen amLODipine (NORVASC) 5 MG tablet Take 1 tablet (5 mg total) by mouth daily. 60 tablet 0  . aspirin 325 MG tablet Take 1 tablet (325 mg total) by mouth daily. 30 tablet 0  . buPROPion (WELLBUTRIN XL) 300 MG 24 hr tablet Take 300 mg by mouth daily.    . Fluticasone-Salmeterol (ADVAIR) 250-50 MCG/DOSE AEPB Inhale 1 puff into the lungs every 12 (twelve) hours.    Marland Kitchen levothyroxine (SYNTHROID, LEVOTHROID) 112 MCG tablet Take 224 mcg by mouth daily.    Maxwell Caul Bicarbonate (ZEGERID) 20-1100 MG CAPS Take 1 capsule by mouth daily before breakfast.    . sertraline (ZOLOFT) 100 MG tablet Take 100 mg by mouth daily.    . traZODone (DESYREL) 150 MG tablet Take by mouth at bedtime.     No current facility-administered medications on file prior to visit.    Allergies:   Allergies  Allergen Reactions  . Ciprofloxacin Other (See Comments)    Spontaneous rupture of tendon  . Sage [Salvia Officinalis] Anaphylaxis    Physical Exam General: well developed, well nourished young Caucasian male, seated, in no evident distress Head: head normocephalic and atraumatic.  Neck: supple with no carotid or supraclavicular bruits Cardiovascular: regular rate and rhythm, no murmurs Musculoskeletal: no deformity Skin:  no rash/petichiae Vascular:  Normal pulses all extremities Filed Vitals:   09/16/15 0936  BP: 116/82  Pulse: 86   Neurologic Exam Mental Status: Awake and fully alert. Oriented to place and time. Recent and remote memory intact. Attention span, concentration and fund of knowledge appropriate. Mood and affect appropriate.  Cranial Nerves: Fundoscopic exam reveals sharp disc margins. Pupils equal, briskly reactive to light. Extraocular movements full without nystagmus. Visual fields full to confrontation. Hearing intact. Facial sensation intact. Face, tongue, palate moves normally and symmetrically.  Motor: Normal bulk and tone. Normal strength in all tested extremity muscles. Diminished  fine finger movements on the right. Orbits left over right upper extremity. Sensory.: intact to touch ,pinprick .position and vibratory sensation.  Coordination: Rapid alternating movements normal in all extremities. Finger-to-nose and heel-to-shin performed accurately bilaterally. Gait and Station: Arises from chair without difficulty. Stance is normal. Gait demonstrates normal stride length and balance . Able to heel, toe and tandem walk without difficulty.  Reflexes: 1+ and symmetric. Toes downgoing.   NIHSS  0 Modified Rankin  1  ASSESSMENT: 25 year Caucasian male with small right frontal and left parietal embolic infarcts of unknown source in October 2016 with vascular risk factors of hypertension, hyperlipidemia and smoking. Mild cognitive difficulties likely due to underlying suboptimally treated depression    PLAN: I had a long d/w patient and mother about his recent stroke, risk for recurrent stroke/TIAs, personally independently reviewed imaging studies and stroke evaluation results and answered questions.Continue aspirin 325 mg daily  for secondary stroke prevention and maintain strict control of hypertension with blood pressure goal below 130/90,   and lipids with LDL cholesterol goal below 70 mg/dL. I have given him a prescription  of Lipitor 20 mg daily and discussed possible side effects with him and his mother and answered questions. I also advised the patient to eat a healthy diet with plenty of whole grains, cereals, fruits and vegetables, exercise regularly and maintain ideal body weight .he was advised to follow-up with his primary care physician for optimal treatment for his depression symptoms. He was complimented on quitting marijuana and counseled to quit smoking completely as well. He may return to work next week. He was also given written information to review at home about possible participation in the RESPECT ESUS trial if interested.Greater than 50% of time during this 25  minute visit was spent on counseling,explanation of diagnosis, planning of further management, discussion with patient and family and coordination of care  Followup in the future with me in 3 months or call earlier if necessary. Delia Heady, MD  Note: This document was prepared with digital dictation and possible smart phrase technology. Any transcriptional errors that result from this process are unintentional

## 2015-09-18 ENCOUNTER — Ambulatory Visit (INDEPENDENT_AMBULATORY_CARE_PROVIDER_SITE_OTHER): Payer: Self-pay | Admitting: Neurology

## 2015-09-18 DIAGNOSIS — I639 Cerebral infarction, unspecified: Secondary | ICD-10-CM

## 2015-09-18 NOTE — Progress Notes (Signed)
RESPECT ESUS STUDY SCREENING VISIT  74 year Caucasian male with small right frontal and left parietal embolic infarcts of unknown source in October 2016 with vascular risk factors of hypertension, hyperlipidemia and smoking Patient has decided to participate in the study. Patient was given opportunity to ask questions about the study which were answered. He signed informed consent. I personally reviewed the inclusion exclusion criteria. Physical and neurological exam were performed as per study protocol. NIH is 0. Modified Rankin score 28  IMP ; 39 year old male with cryptogenic strokes. Patient will undergo 24-hour Holter monitor as per study protocol. He'll return for follow-up in the future as per study protocol  Antony Contras, MD

## 2015-09-22 ENCOUNTER — Telehealth: Payer: Self-pay | Admitting: Neurology

## 2015-09-22 NOTE — Telephone Encounter (Signed)
I spoke with Troy [name].  I let the patient know that he was excluded from the Respect-ESUS research study due to his blood work.  The patient inquired about specifically what was wrong with the blood work.  I told him that I did not have that answer but that I would contact my manager and let him know to call him back.  I called Rizwan and left the patient's phone number with him to call back.

## 2015-09-23 ENCOUNTER — Encounter: Payer: No Typology Code available for payment source | Admitting: Neurology

## 2015-09-23 NOTE — Telephone Encounter (Addendum)
Called the pt tonight and discussed with him over the phone. His AST and ALT were high about 2 months ago and also high recently. I told him that this is the reason he was excluded from the study.  However, most important things are 1) he needs to follow up with Dr. Olevia Bowens to work up the cause of his elevated LFT, and 2) he is on lipitor now and that may need to be hold off for now until his LFT improves. Lipitor is contraindicated if liver enzyme is 3 times higher than the upper normal limit.   He was encouraged to discuss with Dr. Olevia Bowens soon to discuss about above issues. He expressed understanding and appreciation.  Rosalin Hawking, MD PhD Stroke Neurology 09/23/2015 5:42 PM

## 2015-09-24 NOTE — Telephone Encounter (Signed)
thanks

## 2015-10-03 ENCOUNTER — Ambulatory Visit: Payer: No Typology Code available for payment source

## 2015-11-02 ENCOUNTER — Encounter (HOSPITAL_BASED_OUTPATIENT_CLINIC_OR_DEPARTMENT_OTHER): Payer: Self-pay | Admitting: *Deleted

## 2015-11-02 ENCOUNTER — Emergency Department (HOSPITAL_BASED_OUTPATIENT_CLINIC_OR_DEPARTMENT_OTHER)
Admission: EM | Admit: 2015-11-02 | Discharge: 2015-11-02 | Disposition: A | Discharge status: Home / Self Care | Payer: No Typology Code available for payment source | Attending: Emergency Medicine | Admitting: Emergency Medicine

## 2015-11-02 ENCOUNTER — Emergency Department (HOSPITAL_BASED_OUTPATIENT_CLINIC_OR_DEPARTMENT_OTHER): Payer: No Typology Code available for payment source

## 2015-11-02 DIAGNOSIS — R05 Cough: Secondary | ICD-10-CM | POA: Insufficient documentation

## 2015-11-02 DIAGNOSIS — T148 Other injury of unspecified body region: Secondary | ICD-10-CM | POA: Insufficient documentation

## 2015-11-02 DIAGNOSIS — Z8619 Personal history of other infectious and parasitic diseases: Secondary | ICD-10-CM | POA: Insufficient documentation

## 2015-11-02 DIAGNOSIS — T148XXA Other injury of unspecified body region, initial encounter: Secondary | ICD-10-CM

## 2015-11-02 DIAGNOSIS — R110 Nausea: Secondary | ICD-10-CM | POA: Insufficient documentation

## 2015-11-02 DIAGNOSIS — Y9389 Activity, other specified: Secondary | ICD-10-CM | POA: Insufficient documentation

## 2015-11-02 DIAGNOSIS — Z79899 Other long term (current) drug therapy: Secondary | ICD-10-CM | POA: Insufficient documentation

## 2015-11-02 DIAGNOSIS — Z87891 Personal history of nicotine dependence: Secondary | ICD-10-CM | POA: Insufficient documentation

## 2015-11-02 DIAGNOSIS — Z8673 Personal history of transient ischemic attack (TIA), and cerebral infarction without residual deficits: Secondary | ICD-10-CM | POA: Insufficient documentation

## 2015-11-02 DIAGNOSIS — R109 Unspecified abdominal pain: Secondary | ICD-10-CM

## 2015-11-02 DIAGNOSIS — Z7982 Long term (current) use of aspirin: Secondary | ICD-10-CM | POA: Insufficient documentation

## 2015-11-02 DIAGNOSIS — Y998 Other external cause status: Secondary | ICD-10-CM | POA: Insufficient documentation

## 2015-11-02 DIAGNOSIS — I10 Essential (primary) hypertension: Secondary | ICD-10-CM | POA: Insufficient documentation

## 2015-11-02 DIAGNOSIS — Z7951 Long term (current) use of inhaled steroids: Secondary | ICD-10-CM | POA: Insufficient documentation

## 2015-11-02 DIAGNOSIS — Z8719 Personal history of other diseases of the digestive system: Secondary | ICD-10-CM | POA: Insufficient documentation

## 2015-11-02 DIAGNOSIS — Z85818 Personal history of malignant neoplasm of other sites of lip, oral cavity, and pharynx: Secondary | ICD-10-CM | POA: Insufficient documentation

## 2015-11-02 DIAGNOSIS — J45909 Unspecified asthma, uncomplicated: Secondary | ICD-10-CM | POA: Insufficient documentation

## 2015-11-02 DIAGNOSIS — R1031 Right lower quadrant pain: Secondary | ICD-10-CM | POA: Insufficient documentation

## 2015-11-02 DIAGNOSIS — Z8571 Personal history of Hodgkin lymphoma: Secondary | ICD-10-CM | POA: Insufficient documentation

## 2015-11-02 DIAGNOSIS — Y9289 Other specified places as the place of occurrence of the external cause: Secondary | ICD-10-CM | POA: Insufficient documentation

## 2015-11-02 DIAGNOSIS — R1011 Right upper quadrant pain: Secondary | ICD-10-CM | POA: Insufficient documentation

## 2015-11-02 DIAGNOSIS — S3992XA Unspecified injury of lower back, initial encounter: Secondary | ICD-10-CM | POA: Insufficient documentation

## 2015-11-02 DIAGNOSIS — X58XXXA Exposure to other specified factors, initial encounter: Secondary | ICD-10-CM | POA: Insufficient documentation

## 2015-11-02 LAB — CBC WITH DIFFERENTIAL/PLATELET
Basophils Absolute: 0 10*3/uL (ref 0.0–0.1)
Basophils Relative: 0 %
EOS PCT: 3 %
Eosinophils Absolute: 0.4 10*3/uL (ref 0.0–0.7)
HEMATOCRIT: 43 % (ref 39.0–52.0)
HEMOGLOBIN: 14.1 g/dL (ref 13.0–17.0)
LYMPHS ABS: 3.1 10*3/uL (ref 0.7–4.0)
LYMPHS PCT: 24 %
MCH: 26.1 pg (ref 26.0–34.0)
MCHC: 32.8 g/dL (ref 30.0–36.0)
MCV: 79.5 fL (ref 78.0–100.0)
MONOS PCT: 15 %
Monocytes Absolute: 1.9 10*3/uL — ABNORMAL HIGH (ref 0.1–1.0)
NEUTROS ABS: 7.5 10*3/uL (ref 1.7–7.7)
Neutrophils Relative %: 58 %
Platelets: 312 10*3/uL (ref 150–400)
RBC: 5.41 MIL/uL (ref 4.22–5.81)
RDW: 23.5 % — AB (ref 11.5–15.5)
WBC: 12.9 10*3/uL — AB (ref 4.0–10.5)

## 2015-11-02 LAB — COMPREHENSIVE METABOLIC PANEL
ALBUMIN: 3.5 g/dL (ref 3.5–5.0)
ALK PHOS: 53 U/L (ref 38–126)
ALT: 83 U/L — AB (ref 17–63)
ANION GAP: 7 (ref 5–15)
AST: 56 U/L — AB (ref 15–41)
BILIRUBIN TOTAL: 0.5 mg/dL (ref 0.3–1.2)
BUN: 11 mg/dL (ref 6–20)
CALCIUM: 7.8 mg/dL — AB (ref 8.9–10.3)
CO2: 23 mmol/L (ref 22–32)
CREATININE: 1.12 mg/dL (ref 0.61–1.24)
Chloride: 106 mmol/L (ref 101–111)
GFR calc Af Amer: >60 mL/min (ref >60)
GFR calc non Af Amer: >60 mL/min (ref >60)
GLUCOSE: 80 mg/dL (ref 65–99)
Potassium: 3.9 mmol/L (ref 3.5–5.1)
Sodium: 136 mmol/L (ref 135–145)
TOTAL PROTEIN: 7 g/dL (ref 6.5–8.1)

## 2015-11-02 LAB — LIPASE, BLOOD: Lipase: 23 U/L (ref 11–51)

## 2015-11-02 MED ORDER — MORPHINE SULFATE (PF) 4 MG/ML IV SOLN
4.0000 mg | Freq: Once | INTRAVENOUS | Status: AC
  Administered 2015-11-02: 4 mg via INTRAVENOUS
  Filled 2015-11-02: qty 1

## 2015-11-02 MED ORDER — ONDANSETRON HCL 4 MG/2ML IJ SOLN
4.0000 mg | Freq: Once | INTRAMUSCULAR | Status: AC
  Administered 2015-11-02: 4 mg via INTRAVENOUS
  Filled 2015-11-02: qty 2

## 2015-11-02 MED ORDER — CYCLOBENZAPRINE HCL 10 MG PO TABS: 10.0000 mg | ORAL_TABLET | Freq: Two times a day (BID) | ORAL | Status: DC | PRN

## 2015-11-02 MED ORDER — SODIUM CHLORIDE 0.9 % IV BOLUS (SEPSIS)
1000.0000 mL | Freq: Once | INTRAVENOUS | Status: AC
  Administered 2015-11-02: 1000 mL via INTRAVENOUS

## 2015-11-02 MED ORDER — CYCLOBENZAPRINE HCL 10 MG PO TABS
10.0000 mg | ORAL_TABLET | Freq: Once | ORAL | Status: AC
  Administered 2015-11-02: 10 mg via ORAL
  Filled 2015-11-02: qty 1

## 2015-11-02 NOTE — Discharge Instructions (Signed)
Flank Pain °Flank pain refers to pain that is located on the side of the body between the upper abdomen and the back. The pain may occur over a short period of time (acute) or may be long-term or reoccurring (chronic). It may be mild or severe. Flank pain can be caused by many things. °CAUSES  °Some of the more common causes of flank pain include: °· Muscle strains.   °· Muscle spasms.   °· A disease of your spine (vertebral disk disease).   °· A lung infection (pneumonia).   °· Fluid around your lungs (pulmonary edema).   °· A kidney infection.   °· Kidney stones.   °· A very painful skin rash caused by the chickenpox virus (shingles).   °· Gallbladder disease.   °HOME CARE INSTRUCTIONS  °Home care will depend on the cause of your pain. In general, °· Rest as directed by your caregiver. °· Drink enough fluids to keep your urine clear or pale yellow. °· Only take over-the-counter or prescription medicines as directed by your caregiver. Some medicines may help relieve the pain. °· Tell your caregiver about any changes in your pain. °· Follow up with your caregiver as directed. °SEEK IMMEDIATE MEDICAL CARE IF:  °· Your pain is not controlled with medicine.   °· You have new or worsening symptoms. °· Your pain increases.   °· You have abdominal pain.   °· You have shortness of breath.   °· You have persistent nausea or vomiting.   °· You have swelling in your abdomen.   °· You feel faint or pass out.   °· You have blood in your urine. °· You have a fever or persistent symptoms for more than 2-3 days. °· You have a fever and your symptoms suddenly get worse. °MAKE SURE YOU:  °· Understand these instructions. °· Will watch your condition. °· Will get help right away if you are not doing well or get worse. °  °This information is not intended to replace advice given to you by your health care provider. Make sure you discuss any questions you have with your health care provider. °  °Document Released: 11/18/2005 Document  Revised: 06/21/2012 Document Reviewed: 05/11/2012 °Elsevier Interactive Patient Education ©2016 [employer] Inc[employer] ° °

## 2015-11-02 NOTE — ED Notes (Signed)
Right flank pain for weeks. Reports he had a "coughing fit" last night and felt a "pop", States pain is worse and not improving. Pain worse with some movements

## 2015-11-02 NOTE — ED Notes (Signed)
MD at bedside. 

## 2015-11-02 NOTE — ED Provider Notes (Signed)
CSN: [id number]     Arrival date & time 11/02/15  1229 History   First MD Initiated Contact with Patient 11/02/15 1314     Chief Complaint  Patient presents with  . Flank Pain     (Consider location/radiation/quality/duration/timing/severity/associated sxs/prior Treatment) HPI Comments: Pain started as a little pain after stroke in October After time would hurt more with coughing fits Last night had coughing fit and felt something in back pop Severe pain Used to be power lifter, doesn't feel muscular Friday received test results for hep C Severe pain right paraspinal, feels like it is inside   Patient is a 40 y.o. male presenting with flank pain.  Flank Pain Pertinent negatives include no chest pain, no abdominal pain, no headaches and no shortness of breath.    Past Medical History  Diagnosis Date  . Hodgkin disease ([place])   . Thyroid cancer ([place])   . Barrett's esophagus   . GI bleed   . Pulmonary fibrosis ([place])   . Hypertension   . Drug abuse   . Asthma   . Stroke ([place])   . Hodgkin's disease [facility])    Past Surgical History  Procedure Laterality Date  . Spleenectomy    . Thyroidectomy    . Parathyroidectomy    . Tee without cardioversion N/A 07/18/2015    Procedure: TRANSESOPHAGEAL ECHOCARDIOGRAM (TEE);  Surgeon: Thayer Headings, MD;  Location: [facility] ENDOSCOPY;  Service: Cardiovascular;  Laterality: N/A;   Family History  Problem Relation Age of Onset  . Stroke Maternal Grandmother    Social History  Substance Use Topics  . Smoking status: Former Smoker    Types: Cigarettes  . Smokeless tobacco: Never Used  . Alcohol Use: 0.6 oz/week    1 Cans of beer per week     Comment: occasionally    Review of Systems  Constitutional: Negative for fever.  HENT: Negative for sore throat.   Eyes: Negative for visual disturbance.  Respiratory: Negative for shortness of breath.   Cardiovascular: Negative for chest pain.  Gastrointestinal: Positive for nausea. Negative for  vomiting and abdominal pain.  Genitourinary: Positive for flank pain. Negative for dysuria and difficulty urinating.  Musculoskeletal: Positive for back pain. Negative for neck stiffness.  Skin: Negative for rash.  Neurological: Negative for syncope and headaches.      Allergies  Ciprofloxacin and Sage  Home Medications   Prior to Admission medications   Medication Sig Start Date End Date Taking? Authorizing Provider  albuterol (PROVENTIL HFA;VENTOLIN HFA) 108 (90 BASE) MCG/ACT inhaler Inhale 2 puffs into the lungs every 6 (six) hours as needed. For shortness of breath.   Yes Historical Provider, MD  amLODipine (NORVASC) 5 MG tablet Take 1 tablet (5 mg total) by mouth daily. 07/18/15  Yes Shanker Kristeen Mans, MD  aspirin 325 MG tablet Take 1 tablet (325 mg total) by mouth daily. 07/18/15  Yes Shanker Kristeen Mans, MD  atorvastatin (LIPITOR) 20 MG tablet Take 1 tablet (20 mg total) by mouth daily. 09/16/15  Yes Garvin Fila, MD  buPROPion (WELLBUTRIN XL) 300 MG 24 hr tablet Take 300 mg by mouth daily.   Yes Historical Provider, MD  fluticasone (FLONASE) 50 MCG/ACT nasal spray Place into both nostrils daily.   Yes Historical Provider, MD  Fluticasone-Salmeterol (ADVAIR) 250-50 MCG/DOSE AEPB Inhale 1 puff into the lungs every 12 (twelve) hours.   Yes Historical Provider, MD  levothyroxine (SYNTHROID, LEVOTHROID) 112 MCG tablet Take 224 mcg by mouth daily.   Yes Historical  Provider, MD  Omeprazole-Sodium Bicarbonate (ZEGERID) 20-1100 MG CAPS Take 1 capsule by mouth daily before breakfast.   Yes Historical Provider, MD  sertraline (ZOLOFT) 100 MG tablet Take 100 mg by mouth daily.   Yes Historical Provider, MD  traZODone (DESYREL) 150 MG tablet Take by mouth at bedtime.   Yes Historical Provider, MD  cyclobenzaprine (FLEXERIL) 10 MG tablet Take 1 tablet (10 mg total) by mouth 2 (two) times daily as needed for muscle spasms. 11/02/15   Gareth Morgan, MD   BP 126/86 mmHg  Pulse 90  Temp(Src) 98.6  F (37 C) (Oral)  Resp 18  Ht 6' (1.829 m)  Wt 230 lb (104.327 kg)  BMI 31.19 kg/m2  SpO2 96% Physical Exam  Constitutional: He is oriented to person, place, and time. He appears well-developed and well-nourished. No distress.  HENT:  Head: Normocephalic and atraumatic.  Eyes: Conjunctivae and EOM are normal.  Neck: Normal range of motion.  Cardiovascular: Normal rate, regular rhythm, normal heart sounds and intact distal pulses.  Exam reveals no gallop and no friction rub.   No murmur heard. Pulmonary/Chest: Effort normal and breath sounds normal. No respiratory distress. He has no wheezes. He has no rales.  Abdominal: Soft. He exhibits no distension. There is tenderness. There is CVA tenderness. There is no guarding, no tenderness at McBurney's point and negative Murphy's sign.  Inconsistent abdominal exam, at times soft diffusely, at times guarding in RUQ, RLQ  Musculoskeletal: He exhibits no edema.  Neurological: He is alert and oriented to person, place, and time.  Skin: Skin is warm and dry. He is not diaphoretic.  Nursing note and vitals reviewed.   ED Course  Procedures (including critical care time) Labs Review Labs Reviewed  CBC WITH DIFFERENTIAL/PLATELET - Abnormal; Notable for the following:    WBC 12.9 (*)    RDW 23.5 (*)    Monocytes Absolute 1.9 (*)    All other components within normal limits  COMPREHENSIVE METABOLIC PANEL - Abnormal; Notable for the following:    Calcium 7.8 (*)    AST 56 (*)    ALT 83 (*)    All other components within normal limits  LIPASE, BLOOD  CBC WITH DIFFERENTIAL/PLATELET    Imaging Review Ct Renal Stone Study  11/02/2015  CLINICAL DATA:  Right flank pain for several weeks. EXAM: CT ABDOMEN AND PELVIS WITHOUT CONTRAST TECHNIQUE: Multidetector CT imaging of the abdomen and pelvis was performed following the standard protocol without IV contrast. COMPARISON:  None. FINDINGS: Lower chest: The lung bases are clear of acute process. No  pleural effusion or pulmonary lesions. The heart is normal in size. No pericardial effusion. The distal esophagus and aorta are unremarkable. Hepatobiliary: No focal hepatic lesions or intrahepatic biliary dilatation. The gallbladder is normal. No common bile duct dilatation. Pancreas: No mass, inflammation or ductal dilatation. Spleen: Surgically absent. Adrenals/Urinary Tract: The adrenal glands and kidneys are normal. No renal, ureteral or bladder calculi or mass. Stomach/Bowel: The stomach, duodenum, small bowel and colon are grossly normal without oral contrast. No inflammatory changes, mass lesions or obstructive findings. The terminal ileum is normal. The appendix is normal. Vascular/Lymphatic: The aorta is normal in caliber. No atherosclerotic calcifications. No mesenteric or retroperitoneal mass or adenopathy. Other: No abdominal wall hematoma or hernia. The oblique abdominal muscles appear normal. The bladder, prostate gland and seminal vesicles are normal. No inguinal mass or inguinal hernia. Musculoskeletal: No significant bony findings. IMPRESSION: Unremarkable CT abdomen/ pelvis. No acute abdominal/ pelvic findings, mass  lesions or adenopathy. No renal, ureteral or bladder calculi. Electronically Signed   By: Marijo Sanes M.D.   On: 11/02/2015 15:23   I have personally reviewed and evaluated these images and lab results as part of my medical decision-making.   EKG Interpretation None      MDM   Final diagnoses:  Right flank pain  Muscle strain   41 old male with a history of Hodgkin's disease, pulmonary fibrosis, drug abuse, cryptogenic stroke, thyroid cancer, hepatitis C, presents with concern for right flank pain.  Labs are obtained showing no acute abnormalities, mild transaminitis similar to prior.  CT stone study was done given significant flank and initial abdominal tenderness showing no acute abnormalities. Low suspicion at this time given inconsistent exam for appendicitis,  mesenteric ischemia, cholecystitis.  Given history of pain starting with coughing, suspect this is likely musculoskeletal strain.  Given rx for flexeril. Patient discharged in stable condition with understanding of reasons to return.       Gareth Morgan, MD 11/02/15 [phone]

## 2015-11-03 MED FILL — SERTRALINE HCL 100 MG TAB: 100 | 30 days supply | Qty: 30 | Fill #0

## 2015-11-03 MED FILL — traZODone HCL 150 MG TABS: 150 | 30 days supply | Qty: 60 | Fill #0

## 2015-11-11 MED FILL — ROSUVASTATIN CALCIUM 40 MG: 40 | 30 days supply | Qty: 30 | Fill #0

## 2015-11-11 MED FILL — MONTELUKAST SOD 10 MG TAB: 10 | 30 days supply | Qty: 30 | Fill #0

## 2015-11-11 MED FILL — AMLODIPINE BESYLATE 10 MG T: 10 | 30 days supply | Qty: 30 | Fill #0

## 2015-11-21 MED FILL — SERTRALINE HCL 100 MG TAB: 100 | 90 days supply | Qty: 135 | Fill #0

## 2015-12-08 MED FILL — MONTELUKAST SOD 10 MG TAB: 10 | 30 days supply | Qty: 30 | Fill #1

## 2015-12-08 MED FILL — traZODone HCL 150 MG TABS: 150 | 30 days supply | Qty: 60 | Fill #1

## 2015-12-24 ENCOUNTER — Encounter: Payer: Self-pay | Admitting: Neurology

## 2015-12-24 ENCOUNTER — Ambulatory Visit (INDEPENDENT_AMBULATORY_CARE_PROVIDER_SITE_OTHER): Payer: Self-pay | Admitting: Neurology

## 2015-12-24 ENCOUNTER — Telehealth: Payer: Self-pay | Admitting: Neurology

## 2015-12-24 VITALS — BP 114/65 | HR 98 | Ht 72.0 in | Wt 217.2 lb

## 2015-12-24 DIAGNOSIS — I639 Cerebral infarction, unspecified: Secondary | ICD-10-CM

## 2015-12-24 NOTE — Patient Instructions (Signed)
I had a long d/w patient about his recent stroke, risk for recurrent stroke/TIAs, personally independently reviewed imaging studies and stroke evaluation results and answered questions.Continue aspirin 325 mg daily  for secondary stroke prevention and maintain strict control of hypertension with blood pressure goal below 130/90, diabetes with hemoglobin A1c goal below 6.5% and lipids with LDL cholesterol goal below 70 mg/dL. I strongly encouraged him to quit smoking completely and complimented him on quitting drugs. I also advised the patient to eat a healthy diet with plenty of whole grains, cereals, fruits and vegetables, exercise regularly and maintain ideal body weight Followup in the future with stroke nurse practitioner in 6 months or call earlier if necessary. Stroke Prevention Some medical conditions and behaviors are associated with an increased chance of having a stroke. You may prevent a stroke by making healthy choices and managing medical conditions. HOW CAN I REDUCE MY RISK OF HAVING A STROKE?   Stay physically active. Get at least 30 minutes of activity on most or all days.  Do not smoke. It may also be helpful to avoid exposure to secondhand smoke.  Limit alcohol use. Moderate alcohol use is considered to be:  No more than 2 drinks per day for men.  No more than 1 drink per day for nonpregnant women.  Eat healthy foods. This involves:  Eating 5 or more servings of fruits and vegetables a day.  Making dietary changes that address high blood pressure (hypertension), high cholesterol, diabetes, or obesity.  Manage your cholesterol levels.  Making food choices that are high in fiber and low in saturated fat, trans fat, and cholesterol may control cholesterol levels.  Take any prescribed medicines to control cholesterol as directed by your health care provider.  Manage your diabetes.  Controlling your carbohydrate and sugar intake is recommended to manage diabetes.  Take any  prescribed medicines to control diabetes as directed by your health care provider.  Control your hypertension.  Making food choices that are low in salt (sodium), saturated fat, trans fat, and cholesterol is recommended to manage hypertension.  Ask your health care provider if you need treatment to lower your blood pressure. Take any prescribed medicines to control hypertension as directed by your health care provider.  If you are 51-25 years of age, have your blood pressure checked every 3-5 years. If you are 78 years of age or older, have your blood pressure checked every year.  Maintain a healthy weight.  Reducing calorie intake and making food choices that are low in sodium, saturated fat, trans fat, and cholesterol are recommended to manage weight.  Stop drug abuse.  Avoid taking birth control pills.  Talk to your health care provider about the risks of taking birth control pills if you are over 36 years old, smoke, get migraines, or have ever had a blood clot.  Get evaluated for sleep disorders (sleep apnea).  Talk to your health care provider about getting a sleep evaluation if you snore a lot or have excessive sleepiness.  Take medicines only as directed by your health care provider.  For some people, aspirin or blood thinners (anticoagulants) are helpful in reducing the risk of forming abnormal blood clots that can lead to stroke. If you have the irregular heart rhythm of atrial fibrillation, you should be on a blood thinner unless there is a good reason you cannot take them.  Understand all your medicine instructions.  Make sure that other conditions (such as anemia or atherosclerosis) are addressed. SEEK IMMEDIATE  MEDICAL CARE IF:   You have sudden weakness or numbness of the face, arm, or leg, especially on one side of the body.  Your face or eyelid droops to one side.  You have sudden confusion.  You have trouble speaking (aphasia) or understanding.  You have  sudden trouble seeing in one or both eyes.  You have sudden trouble walking.  You have dizziness.  You have a loss of balance or coordination.  You have a sudden, severe headache with no known cause.  You have new chest pain or an irregular heartbeat. Any of these symptoms may represent a serious problem that is an emergency. Do not wait to see if the symptoms will go away. Get medical help at once. Call your local emergency services (911 in [place] Do not drive yourself to the hospital.   This information is not intended to replace advice given to you by your health care provider. Make sure you discuss any questions you have with your health care provider.   Document Released: 11/04/2004 Document Revised: 10/18/2014 Document Reviewed: 03/30/2013 Elsevier Interactive Patient Education [employer].

## 2015-12-24 NOTE — Telephone Encounter (Signed)
FYI- Pt is calling to make 6 month f/u and pmt.

## 2015-12-24 NOTE — Progress Notes (Signed)
[facility] [address] [place]. [place] [address] [phone]       OFFICE FOLLOW-UP NOTE  Troy [name] Date of Birth:  10-23-1975 Medical Record Number:  [id number]   HPI: 33 year Caucasian male with remote history of Hodgkin's lymphoma and thyroid cancer and asthma who is seen today for the first office follow-up visit following recent hospital admission for stroke in October 2016.Troy [name] is an 40 y.o. male history of Hodgkin's disease, thyroid cancer, Barrett's esophagus, GI bleed, pulmonary fibrosis hypertension and drug abuse, presenting with new onset speech output difficulty as well as complained of weakness and numbness involving right extremities. Onset of symptoms was at 9:30 PM on 07/16/2015 ([facility]). He has no previous history of stroke nor TIA. CT scan of his head showed no acute intracranial abnormality. MRI showed tiny punctate foci of high signal intensity on DWI sequence within cortical gray matter of the right frontal lobe and left parietal lobe, possibly acute ischemic infarction. Patient was not administered TPA secondary to no clear objective focal deficits. He was admitted for further evaluation and treatment. CT scan of the head showed no acute abnormality MRI scan showed tiny punctate right frontal and left parietal infarcts. Transthoracic echo showed slightly diminished ejection fraction and subsequently an outpatient transesophageal echocardiogram was done on 07/18/15 which showed reduced EF of 40-45% but no definite cardiac source of embolism. There was a small mobile aortic arch plaque noted. Hemoglobin A1c was 5.6. LDL cholesterol was elevated at 145. Anticardiolipin antibodies, homocystine, lupus anticoagulant, HIV antibody, antithrombin III and complement levels were all normal. Patient was recommended to be on a statin but for unclear reason I do not see that on his medication list today. He states his done well he has quit marijuana as well  as smoking. He still notices some diminished fine motor skills and coordination is right hand. He does tend to be shaking his hand. His sensation seems to have improved. He has only occasional balance issues but has had no falls. At times he feels he has some trouble speaking when he gets excited or tries to talk quickly. He plans to go back to work next week. He has a long-standing history of depression and feels that he is having some increasing short-term memory and cognitive difficulties. He has been on Wellbutrin 300 mg for a long time and recently primary care physician added Zoloft 100 mg 2 weeks ago. Update 12/24/2015 : He returns for follow-up after last visit to 3 months ago. The patient did try out for Respect ESUS trial but was a screen failure due to elevated liver function tests which can be attributed to his prior history of hepatitis C. The patient has quit doing drugs but unfortunately has resumed smoking again. He is blood pressure is well controlled today at 114/65. His primary doctor recently increased his blood pressure medications which seem to be working. His statin was also changed to Crestor a few months ago and is tolerating it well without side effects. Patient recently gave a test for unemployment and is waiting to her back. Continues to have memory difficulties as well as word finding difficulties particularly when his stressed out or tired. He remains on Wellbutrin and Zoloft for his depression but does of Zoloft has recently been increased to 150 mg. ROS:   14 system review of systems is positive for fatigue,  appetite change, excessive sweating, blurred vision, cough, abdominal pain, nausea, insomnia, frequent waking, difficulty urinating, speech difficulty,  weakness, tremors, confusion, decreased concentration, nervousness anxiety, neck stiffness, itching, memory loss and all other systems negative  PMH:  Past Medical History  Diagnosis Date  . Hodgkin disease (HCC)   .  Thyroid cancer (HCC)   . Barrett's esophagus   . GI bleed   . Pulmonary fibrosis (HCC)   . Hypertension   . Drug abuse   . Asthma   . Stroke (HCC)   . Hodgkin's disease [facility])     Social History:  Social History   Social History  . Marital Status: Unknown    Spouse Name: N/A  . Number of Children: N/A  . Years of Education: N/A   Occupational History  . Not on file.   Social History Main Topics  . Smoking status: Former Smoker    Types: Cigarettes  . Smokeless tobacco: Never Used  . Alcohol Use: No     Comment: occasionally  . 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 Sexual Activity: Not on file   Other Topics Concern  . Not on file   Social History Narrative    Medications:   Current Outpatient Prescriptions on File Prior to Visit  Medication Sig Dispense Refill  . albuterol (PROVENTIL HFA;VENTOLIN HFA) 108 (90 BASE) MCG/ACT inhaler Inhale 2 puffs into the lungs every 6 (six) hours as needed. For shortness of breath.    Marland Kitchen aspirin 325 MG tablet Take 1 tablet (325 mg total) by mouth daily. 30 tablet 0  . buPROPion (WELLBUTRIN XL) 300 MG 24 hr tablet Take 300 mg by mouth daily.    . fluticasone (FLONASE) 50 MCG/ACT nasal spray Place into both nostrils daily.    . Fluticasone-Salmeterol (ADVAIR) 250-50 MCG/DOSE AEPB Inhale 1 puff into the lungs every 12 (twelve) hours.    Marland Kitchen levothyroxine (SYNTHROID, LEVOTHROID) 112 MCG tablet Take 224 mcg by mouth daily.    Maxwell Caul Bicarbonate (ZEGERID) 20-1100 MG CAPS Take 1 capsule by mouth daily before breakfast.    . sertraline (ZOLOFT) 100 MG tablet Take 100 mg by mouth daily.    . traZODone (DESYREL) 150 MG tablet Take by mouth at bedtime.     No current facility-administered medications on file prior to visit.    Allergies:   Allergies  Allergen Reactions  . Ciprofloxacin Other (See Comments)    Spontaneous rupture of tendon  . Earnestine Leys Officinalis] Anaphylaxis and Hives  . Fluocinolone  Other (See Comments)    "spontaneous rupture of tendons" per pt dr.    Physical Exam General: well developed, well nourished young Caucasian male, seated, in no evident distress Head: head normocephalic and atraumatic.  Neck: supple with no carotid or supraclavicular bruits Cardiovascular: regular rate and rhythm, no murmurs Musculoskeletal: no deformity Skin:  no rash/petichiae Vascular:  Normal pulses all extremities Filed Vitals:   12/24/15 1015  BP: 114/65  Pulse: 98   Neurologic Exam Mental Status: Awake and fully alert. Oriented to place and time. Recent and remote memory intact. Attention span, concentration and fund of knowledge appropriate. Mood and affect appropriate.  Cranial Nerves: Fundoscopic exam reveals sharp disc margins. Pupils equal, briskly reactive to light. Extraocular movements full without nystagmus. Visual fields full to confrontation. Hearing intact. Facial sensation intact. Face, tongue, palate moves normally and symmetrically.  Motor: Normal bulk and tone. Normal strength in all tested extremity muscles. Diminished fine finger movements on the right. Orbits left over right upper extremity. Sensory.: intact to touch ,pinprick .position and vibratory  sensation.  Coordination: Rapid alternating movements normal in all extremities. Finger-to-nose and heel-to-shin performed accurately bilaterally. Gait and Station: Arises from chair without difficulty. Stance is normal. Gait demonstrates normal stride length and balance . Able to heel, toe and tandem walk without difficulty.  Reflexes: 1+ and symmetric. Toes downgoing.   NIHSS  0 Modified Rankin  1  ASSESSMENT: 54 year Caucasian male with small right frontal and left parietal embolic infarcts of unknown source in October 2016 with vascular risk factors of hypertension, hyperlipidemia and smoking. Mild cognitive difficulties likely due to underlying suboptimally treated depression    PLAN: I had a long d/w  patient about his recent stroke, risk for recurrent stroke/TIAs, personally independently reviewed imaging studies and stroke evaluation results and answered questions.Continue aspirin 325 mg daily  for secondary stroke prevention and maintain strict control of hypertension with blood pressure goal below 130/90, diabetes with hemoglobin A1c goal below 6.5% and lipids with LDL cholesterol goal below 70 mg/dL. I strongly encouraged him to quit smoking completely and complimented him on quitting drugs. I also advised the patient to eat a healthy diet with plenty of whole grains, cereals, fruits and vegetables, exercise regularly and maintain ideal body weight Followup in the future with stroke nurse practitioner in 6 months or call earlier if necessary. Delia Heady, MD  Note: This document was prepared with digital dictation and possible smart phrase technology. Any transcriptional errors that result from this process are unintentional

## 2016-01-07 MED FILL — BUPROPION HCL XL 300 MG TAB: 300 | 90 days supply | Qty: 90 | Fill #1

## 2016-01-07 MED FILL — MONTELUKAST SOD 10 MG TAB: 10 | 30 days supply | Qty: 30 | Fill #2

## 2016-01-07 MED FILL — ROSUVASTATIN CALCIUM 40 MG: 40 | 30 days supply | Qty: 30 | Fill #1

## 2016-01-07 MED FILL — traZODone HCL 150 MG TABS: 150 | 30 days supply | Qty: 60 | Fill #2

## 2016-01-16 MED FILL — QUETIAPINE FUMARATE 100 MG: 100 | 10 days supply | Qty: 30 | Fill #0

## 2016-01-19 MED FILL — AMLODIPINE BESYLATE 10 MG T: 10 | 30 days supply | Qty: 30 | Fill #0

## 2016-01-27 MED FILL — QUETIAPINE FUMARATE 100 MG: 100 | 10 days supply | Qty: 30 | Fill #0

## 2016-02-01 IMAGING — CT CT RENAL STONE PROTOCOL
2 of 4 series · 15 of 46 positions shown, 17 images · non-contrast
Comparison: None.

CLINICAL DATA: Right flank pain for several weeks.

EXAM:
CT ABDOMEN AND PELVIS WITHOUT CONTRAST
TECHNIQUE: Multidetector CT imaging of the abdomen and pelvis was performed
following the standard protocol without IV contrast.

[Series 2: axial st · axial · 0.98mm/px · z∈[-501,-91]mm · 12 of 98 slices shown, 14 images]
[im 8/98  soft-tissue]
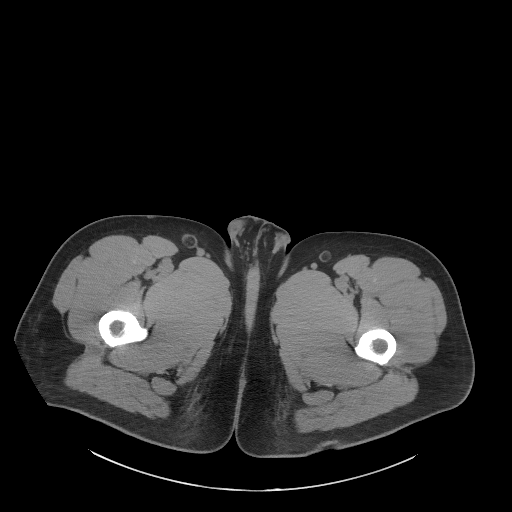
[im 8/98  bone]
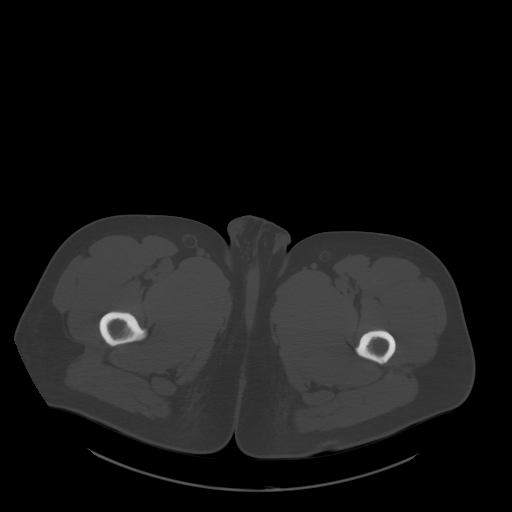
[im 15/98  soft-tissue]
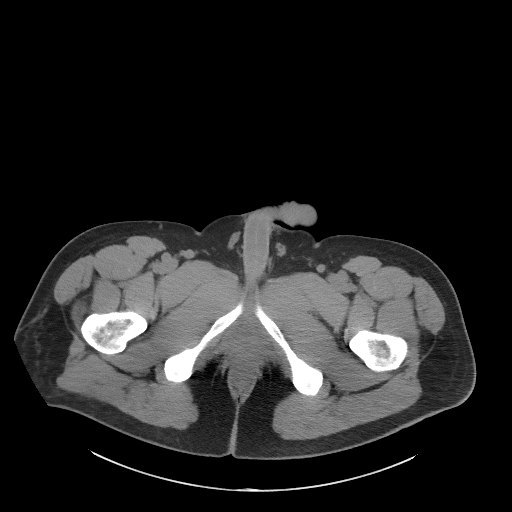
[im 22/98  soft-tissue]
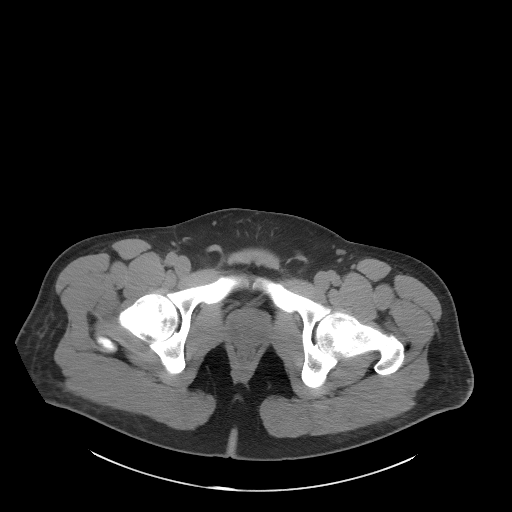
[im 29/98  soft-tissue]
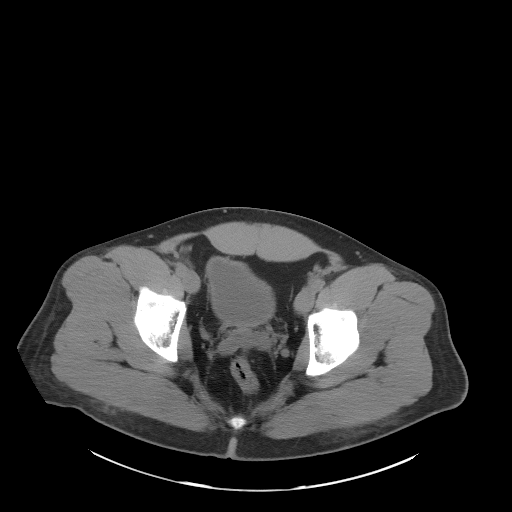
[im 36/98  soft-tissue]
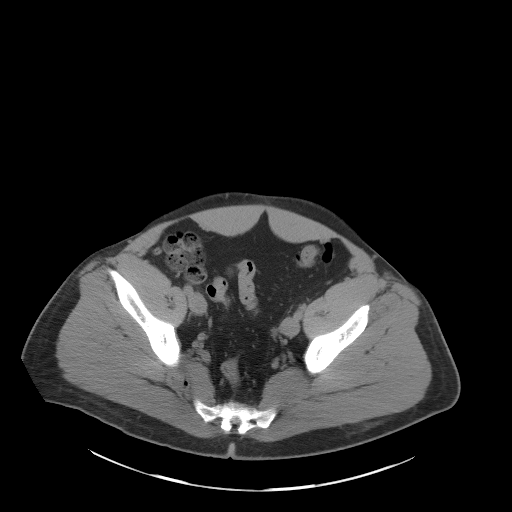
[im 44/98  soft-tissue]
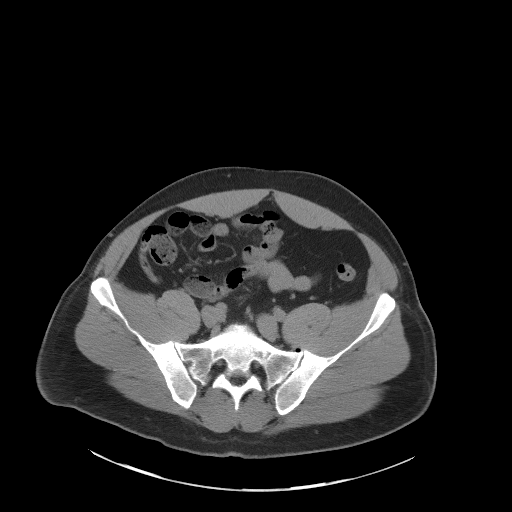
[im 54/98  soft-tissue]
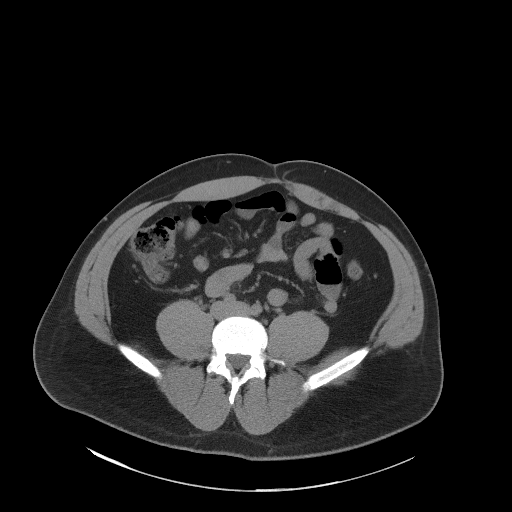
[im 62/98  soft-tissue]
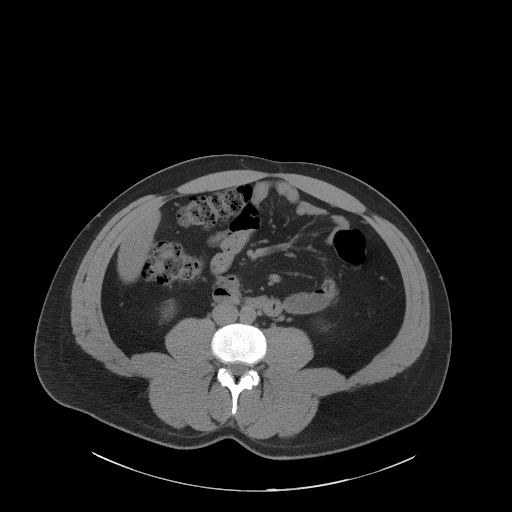
[im 69/98  soft-tissue]
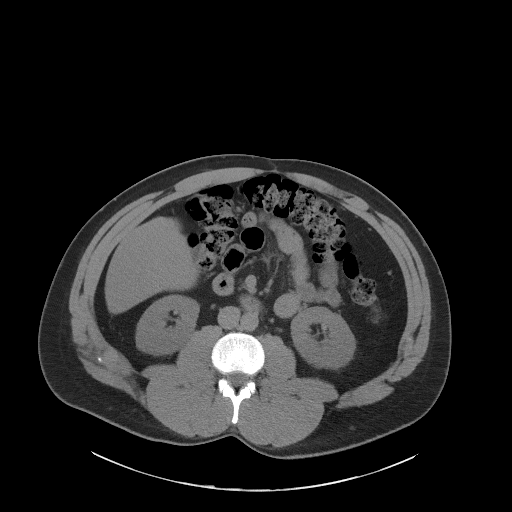
[im 69/98  bone]
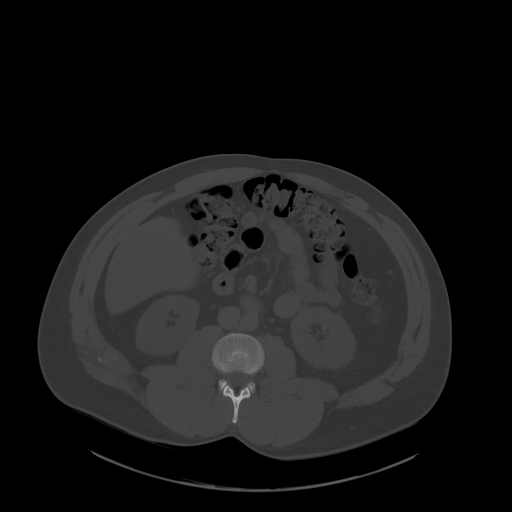
[im 76/98  soft-tissue]
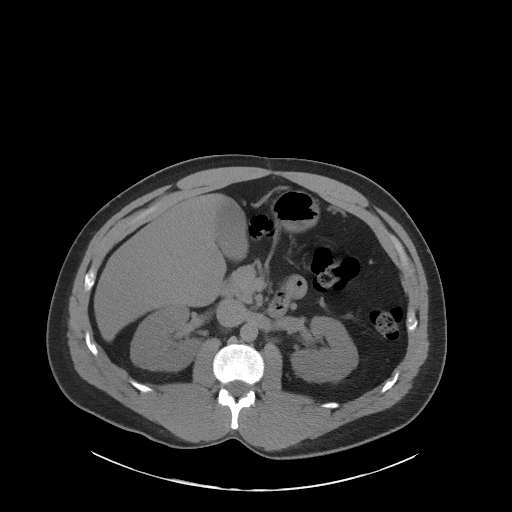
[im 83/98  soft-tissue]
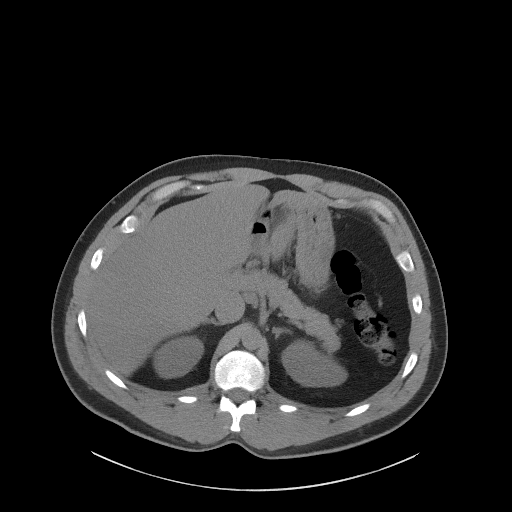
[im 90/98  soft-tissue]
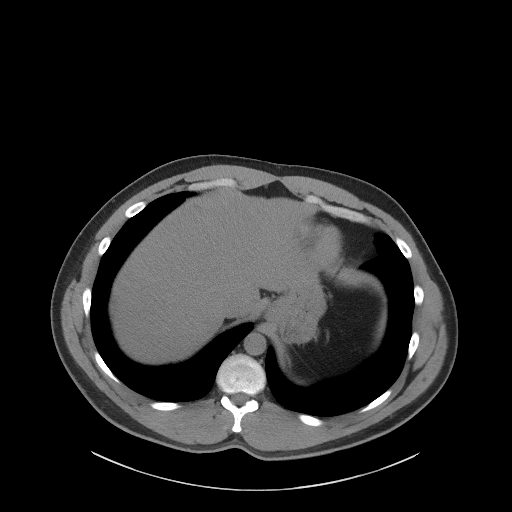

[Series 5: coronal st · coronal · 0.96mm/px · 3 of 91 slices shown]
[im 31/91  soft-tissue]
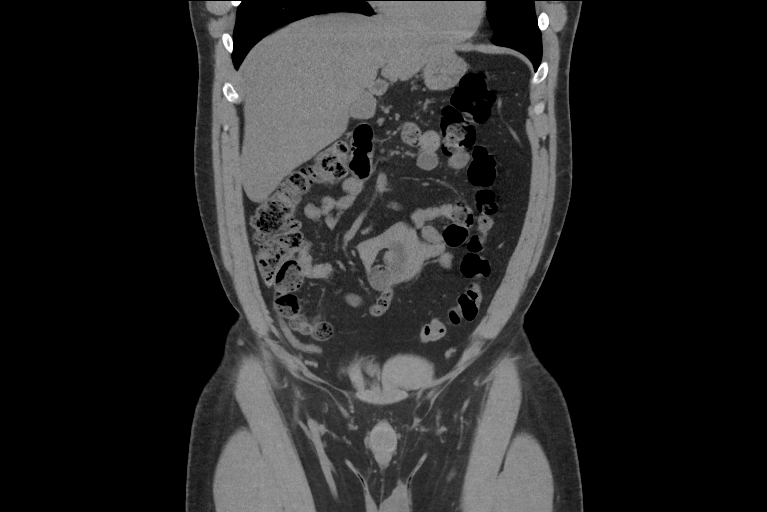
[im 41/91  soft-tissue]
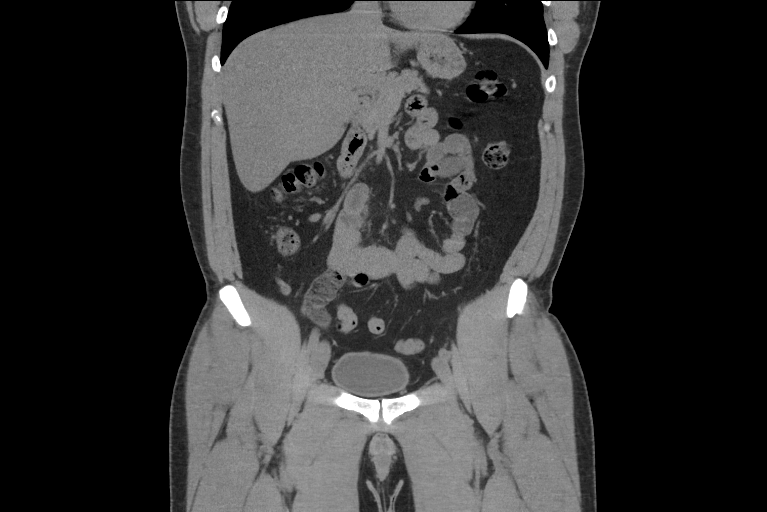
[im 51/91  soft-tissue]
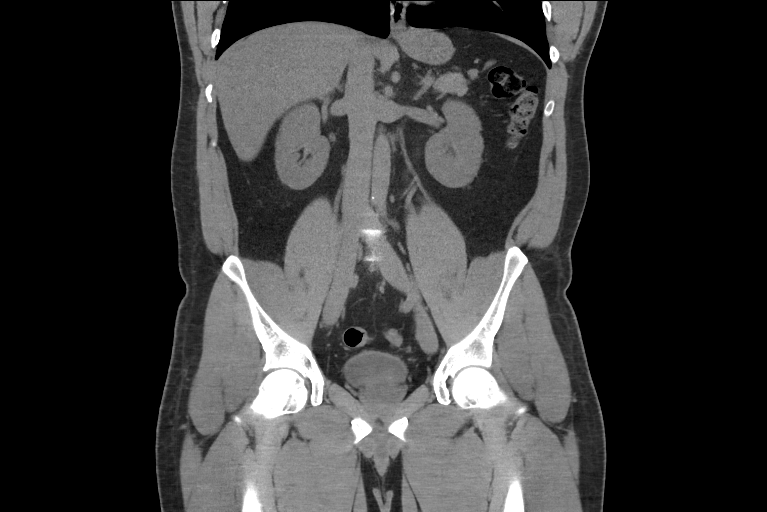

[15 of 46 positions shown; findings below may reference images not displayed]

FINDINGS: Lower chest: The lung bases are clear of acute process. No pleural
effusion or pulmonary lesions. The heart is normal in size. No
pericardial effusion. The distal esophagus and aorta are
unremarkable.

Hepatobiliary: No focal hepatic lesions or intrahepatic biliary
dilatation. The gallbladder is normal. No common bile duct
dilatation.

Pancreas: No mass, inflammation or ductal dilatation.

Spleen: Surgically absent.

Adrenals/Urinary Tract: The adrenal glands and kidneys are normal.
No renal, ureteral or bladder calculi or mass.

Stomach/Bowel: The stomach, duodenum, small bowel and colon are
grossly normal without oral contrast. No inflammatory changes, mass
lesions or obstructive findings. The terminal ileum is normal. The
appendix is normal.

Vascular/Lymphatic: The aorta is normal in caliber. No
atherosclerotic calcifications. No mesenteric or retroperitoneal
mass or adenopathy.

Other: No abdominal wall hematoma or hernia. The oblique abdominal
muscles appear normal. The bladder, prostate gland and seminal
vesicles are normal. No inguinal mass or inguinal hernia.

Musculoskeletal: No significant bony findings.
IMPRESSION: Unremarkable CT abdomen/ pelvis. No acute abdominal/ pelvic
findings, mass lesions or adenopathy. No renal, ureteral or bladder
calculi.

## 2016-02-04 MED FILL — QUETIAPINE FUMARATE 100 MG: 100 | 20 days supply | Qty: 60 | Fill #1

## 2016-02-17 MED FILL — AMLODIPINE BESYLATE 10 MG T: 10 | 30 days supply | Qty: 30 | Fill #1

## 2016-02-19 MED FILL — QUETIAPINE FUMARATE 200 MG: 200 | 30 days supply | Qty: 60 | Fill #0

## 2016-02-19 MED FILL — SERTRALINE HCL 100 MG TAB: 100 | 90 days supply | Qty: 135 | Fill #0

## 2016-03-04 MED FILL — ROSUVASTATIN CALCIUM 40 MG: 40 | 30 days supply | Qty: 30 | Fill #2

## 2016-03-04 MED FILL — traZODone HCL 150 MG TABS: 150 | 30 days supply | Qty: 60 | Fill #0

## 2016-03-18 MED FILL — QUETIAPINE FUMARATE 200 MG: 200 | 30 days supply | Qty: 60 | Fill #1

## 2016-03-18 MED FILL — AMLODIPINE BESYLATE 10 MG T: 10 | 30 days supply | Qty: 30 | Fill #2

## 2016-03-22 ENCOUNTER — Telehealth: Payer: Self-pay | Admitting: Neurology

## 2016-03-22 NOTE — Telephone Encounter (Signed)
Faxed records to [facility] on 03/19/16 and received confirmation 03/19/16 at 1500

## 2016-03-27 ENCOUNTER — Emergency Department (HOSPITAL_BASED_OUTPATIENT_CLINIC_OR_DEPARTMENT_OTHER)
Admission: EM | Admit: 2016-03-27 | Discharge: 2016-03-27 | Disposition: A | Discharge status: Home / Self Care | Payer: No Typology Code available for payment source | Attending: Emergency Medicine | Admitting: Emergency Medicine

## 2016-03-27 ENCOUNTER — Encounter (HOSPITAL_BASED_OUTPATIENT_CLINIC_OR_DEPARTMENT_OTHER): Payer: Self-pay

## 2016-03-27 DIAGNOSIS — J45909 Unspecified asthma, uncomplicated: Secondary | ICD-10-CM | POA: Insufficient documentation

## 2016-03-27 DIAGNOSIS — I10 Essential (primary) hypertension: Secondary | ICD-10-CM | POA: Insufficient documentation

## 2016-03-27 DIAGNOSIS — Z87891 Personal history of nicotine dependence: Secondary | ICD-10-CM | POA: Insufficient documentation

## 2016-03-27 DIAGNOSIS — K0889 Other specified disorders of teeth and supporting structures: Secondary | ICD-10-CM | POA: Insufficient documentation

## 2016-03-27 MED ORDER — PENICILLIN V POTASSIUM 500 MG PO TABS: 500.0000 mg | ORAL_TABLET | Freq: Three times a day (TID) | ORAL | Status: AC

## 2016-03-27 MED ORDER — ETODOLAC 500 MG PO TABS: 500.0000 mg | ORAL_TABLET | Freq: Two times a day (BID) | ORAL | Status: DC

## 2016-03-27 MED ORDER — HYDROCODONE-ACETAMINOPHEN 5-325 MG PO TABS
1.0000 | ORAL_TABLET | ORAL | Status: AC
  Administered 2016-03-27: 1 via ORAL
  Filled 2016-03-27: qty 1

## 2016-03-27 MED ORDER — PENICILLIN V POTASSIUM 250 MG PO TABS
500.0000 mg | ORAL_TABLET | Freq: Once | ORAL | Status: AC
  Administered 2016-03-27: 500 mg via ORAL
  Filled 2016-03-27: qty 2

## 2016-03-27 NOTE — Discharge Instructions (Signed)
[facility] [employer] is a great source of information about community services available.  Access by dialing 2-1-1 from anywhere in [place], or by website -  Cust[employer]ian[employer]fi.   Other Local Resources (Updated 10/2015)  Dental  Care   Services    Phone Number and Address  Cost  [facility] For children 62 - 38 years of age:   Cleaning  Tooth brushing/flossing instruction  Sealants, fillings, crowns  Extractions  Emergency treatment  [phone] [address], [place] [address] Charges based on family income.  Medicaid and some insurance plans accepted.     [facility] - [facility], fillings, crowns  Extractions  Emergency treatment [phone] [address] [place], [place]  Pregnant women 39 years of age or older with a Medicaid card  [facility] - [place]  Cleaning  Sealants, fillings, crowns  Extractions  Emergency treatment [phone] [address] [place], [place] Pregnant women 73 years of age or older with a Medicaid card  [facility] For children 43 - 33 years of age:   Cleaning  Tooth brushing/flossing instruction  Sealants, fillings, crowns  Extractions  Emergency treatment Limited orthodontic services for patients with Medicaid [phone] [address], [place] [address] Medicaid and [facility] Health Choice cover for children up to age 71 and pregnant women.  Parents of children up to age 47 without Medicaid pay a reduced fee at time of service.  [place] For children 88 - 33 years of age:   Cleaning  Tooth brushing/flossing instruction  Sealants, fillings, crowns  Extractions  Emergency treatment Limited orthodontic services for patients with Medicaid  [phone] [place], [place].  Medicaid and [place] Health Choice cover for children up to age 26 and pregnant women.  Parents of children up to age 42 without Medicaid pay a reduced fee.  [facility], [facility]  Sealants, fillings, crowns  Extractions  Hours: Tuesdays and Thursdays, 4:15 - 8 pm [phone] [address] [place], [place] [address] Services free of charge to [facility] residents ages 18-64 who do not have health insurance, Medicare, [place], or [place] benefits and fall within federal poverty guidelines  [place] care in addition to primary medical care, nutritional counseling, and pharmacy:  Engineer, drilling, fillings, crowns  Extractions                  [phone] [facility] [place], [place], [place], [place], [place], [place] [facility], [place], [place] [facility] [place], [place], [place], most insurance.  Also provides services available to all with fees adjusted based on ability to pay.    [facility]  Cleaning  Tooth brushing/flossing instruction  Sealants, fillings, crowns  Extractions  Emergency treatment Hours: Tuesdays, Thursdays, and Fridays from 8 am to 5 pm by appointment only. [phone] [place], [place] [address] [facility] residents with Medicaid (depending on eligibility) and children with [facility] Health Choice - call for more information.  [facility] • Extractions only ° °Hours: 2nd and 4th Thursday of each month from 6:30 am - 9 am.   [phone]. [phone] °[place], [place] [address] Ages 18 and older only.  Patients are seen on a first come, first served basis.  °[facility] [employer] [facility] • Cleanings °• Fillings °• Extractions °• Orthodontics °• Endodontics °• Implants/Crowns/Bridges °• Complete and partial dentures [phone] °[place], [place] Patients must complete an application for services.  There is often a waiting list.   ° °

## 2016-03-27 NOTE — ED Provider Notes (Signed)
CSN: [id number]     Arrival date & time 03/27/16  2141 History  By signing my name below, I, Altamease Oiler, attest that this documentation has been prepared under the direction and in the presence of Dorie Rank, MD. Electronically Signed: Altamease Oiler, ED Scribe. 03/27/2016. 10:59 PM   Chief Complaint  Patient presents with  . Dental Pain   The history is provided by the patient. No language interpreter was used.   Troy [name] is a 40 y.o. male who presents to the Emergency Department complaining of worsening, constant, 9/10 in severity, left upper dental pain with onset earlier in the week. The pain is exacerbated by heat and coldness. Pt states that he fractured his tooth a year ago and must have re-injured it this week.  Pt denies fever and trouble swallowing.   Past Medical History  Diagnosis Date  . Hodgkin disease ([place])   . Thyroid cancer ([place])   . Barrett's esophagus   . GI bleed   . Pulmonary fibrosis ([place])   . Hypertension   . Drug abuse   . Asthma   . Stroke ([place])   . Hodgkin's disease [facility])    Past Surgical History  Procedure Laterality Date  . Spleenectomy    . Thyroidectomy    . Parathyroidectomy    . Tee without cardioversion N/A 07/18/2015    Procedure: TRANSESOPHAGEAL ECHOCARDIOGRAM (TEE);  Surgeon: Thayer Headings, MD;  Location: [facility] ENDOSCOPY;  Service: Cardiovascular;  Laterality: N/A;   Family History  Problem Relation Age of Onset  . Stroke Maternal Grandmother    Social History  Substance Use Topics  . Smoking status: Former Smoker    Types: Cigarettes  . Smokeless tobacco: Never Used  . Alcohol Use: No     Comment: occasionally    Review of Systems  HENT: Positive for dental problem.   All other systems reviewed and are negative.   Allergies  Ciprofloxacin; Sage; and Fluocinolone  Home Medications   Prior to Admission medications   Medication Sig Start Date End Date Taking? Authorizing Provider  albuterol (PROVENTIL HFA;VENTOLIN  HFA) 108 (90 BASE) MCG/ACT inhaler Inhale 2 puffs into the lungs every 6 (six) hours as needed. For shortness of breath.    Historical Provider, MD  albuterol (PROVENTIL) (5 MG/ML) 0.5% nebulizer solution     Historical Provider, MD  amLODipine (NORVASC) 10 MG tablet  11/11/15   Historical Provider, MD  aspirin 325 MG tablet Take 1 tablet (325 mg total) by mouth daily. 07/18/15   Shanker Kristeen Mans, MD  buPROPion (WELLBUTRIN XL) 300 MG 24 hr tablet Take 300 mg by mouth daily.    Historical Provider, MD  fluticasone (FLONASE) 50 MCG/ACT nasal spray Place into both nostrils daily.    Historical Provider, MD  Fluticasone-Salmeterol (ADVAIR) 250-50 MCG/DOSE AEPB Inhale 1 puff into the lungs every 12 (twelve) hours.    Historical Provider, MD  levothyroxine (SYNTHROID, LEVOTHROID) 112 MCG tablet Take 224 mcg by mouth daily.    Historical Provider, MD  lisinopril (PRINIVIL,ZESTRIL) 20 MG tablet Take by mouth.    Historical Provider, MD  montelukast (SINGULAIR) 10 MG tablet  11/11/15   Historical Provider, MD  Omeprazole-Sodium Bicarbonate (ZEGERID) 20-1100 MG CAPS Take 1 capsule by mouth daily before breakfast.    Historical Provider, MD  rosuvastatin (CRESTOR) 40 MG tablet  11/11/15   Historical Provider, MD  sertraline (ZOLOFT) 100 MG tablet Take 100 mg by mouth daily.    Historical Provider, MD  traZODone (  DESYREL) 150 MG tablet Take by mouth at bedtime.    Historical Provider, MD   BP 131/68 mmHg  Pulse 105  Temp(Src) 98 F (36.7 C) (Oral)  Resp 16  Ht 6' (1.829 m)  Wt 220 lb (99.791 kg)  BMI 29.83 kg/m2  SpO2 100% Physical Exam  Constitutional: He appears well-developed and well-nourished. No distress.  HENT:  Head: Normocephalic and atraumatic.  Right Ear: External ear normal.  Left Ear: External ear normal.  Mouth/Throat:    No submandibular or submental swelling   Eyes: Conjunctivae are normal. Right eye exhibits no discharge. Left eye exhibits no discharge. No scleral icterus.  Neck:  Neck supple. No tracheal deviation present.  Cardiovascular: Normal rate.   Pulmonary/Chest: Effort normal. No stridor. No respiratory distress.  Musculoskeletal: He exhibits no edema.  Neurological: He is alert. Cranial nerve deficit: no gross deficits.  Skin: Skin is warm and dry. No rash noted.  Psychiatric: He has a normal mood and affect.  Nursing note and vitals reviewed.   ED Course  Procedures (including critical care time) DIAGNOSTIC STUDIES: Oxygen Saturation is 100% on RA, normal by my interpretation.    COORDINATION OF CARE: 10:57 PM Discussed treatment plan which includes pain medication with pt at bedside and pt agreed to plan.   MDM   Final diagnoses:  Toothache   Will start patient on oral abx and give him pain meds.  Follow up with a dentist.  Outpatient resource guide provided.  I personally performed the services described in this documentation, which was scribed in my presence.  The recorded information has been reviewed and is accurate.   Dorie Rank, MD 03/27/16 [id number]

## 2016-03-27 NOTE — ED Notes (Signed)
Pt reports broken tooth. Left upper dental pain. No dentist.

## 2016-04-09 MED FILL — BUPROPION HCL XL 300 MG TAB: 300 | 30 days supply | Qty: 30 | Fill #0

## 2016-04-09 MED FILL — traZODone HCL 150 MG TABS: 150 | 30 days supply | Qty: 60 | Fill #1

## 2016-04-20 MED FILL — QUETIAPINE FUMARATE 200 MG: 200 | 30 days supply | Qty: 60 | Fill #0

## 2016-04-22 MED FILL — AMLODIPINE BESYLATE 10 MG T: 10 | 30 days supply | Qty: 30 | Fill #0 | Status: TO

## 2016-05-05 MED FILL — BUPROPION HCL XL 300 MG TAB: 300 | 30 days supply | Qty: 30 | Fill #1

## 2016-05-06 MED FILL — ROSUVASTATIN CALCIUM 40 MG: 40 | 30 days supply | Qty: 30 | Fill #0

## 2016-05-11 MED FILL — SERTRALINE HCL 100 MG TAB: 100 | 90 days supply | Qty: 135 | Fill #0

## 2016-05-18 MED FILL — QUETIAPINE FUMARATE 200 MG: 200 | 30 days supply | Qty: 60 | Fill #0 | Status: TO

## 2016-05-20 MED FILL — AMLODIPINE BESYLATE 10 MG T: 10 | 30 days supply | Qty: 30 | Fill #1 | Status: TO

## 2016-05-25 MED FILL — traZODone HCL 150 MG TABS: 150 | 30 days supply | Qty: 60 | Fill #2

## 2016-05-28 MED FILL — VENTOLIN HFA 90 MCG INHALER: 108 (90 BAS | 30 days supply | Qty: 18 | Fill #0

## 2016-06-07 MED FILL — GENERLAC 10 GM/15 ML SOLN: 10 | 10 days supply | Qty: 300 | Fill #0

## 2016-06-21 ENCOUNTER — Ambulatory Visit: Payer: Self-pay | Admitting: Neurology

## 2016-08-01 ENCOUNTER — Emergency Department (HOSPITAL_BASED_OUTPATIENT_CLINIC_OR_DEPARTMENT_OTHER)
Admission: EM | Admit: 2016-08-01 | Discharge: 2016-08-02 | Disposition: A | Discharge status: Home / Self Care | Payer: No Typology Code available for payment source | Attending: Emergency Medicine | Admitting: Emergency Medicine

## 2016-08-01 ENCOUNTER — Encounter (HOSPITAL_BASED_OUTPATIENT_CLINIC_OR_DEPARTMENT_OTHER): Payer: Self-pay | Admitting: Emergency Medicine

## 2016-08-01 DIAGNOSIS — J45909 Unspecified asthma, uncomplicated: Secondary | ICD-10-CM | POA: Insufficient documentation

## 2016-08-01 DIAGNOSIS — Z7289 Other problems related to lifestyle: Secondary | ICD-10-CM | POA: Insufficient documentation

## 2016-08-01 DIAGNOSIS — F1910 Other psychoactive substance abuse, uncomplicated: Secondary | ICD-10-CM | POA: Insufficient documentation

## 2016-08-01 DIAGNOSIS — Z79899 Other long term (current) drug therapy: Secondary | ICD-10-CM | POA: Insufficient documentation

## 2016-08-01 DIAGNOSIS — Z7951 Long term (current) use of inhaled steroids: Secondary | ICD-10-CM | POA: Insufficient documentation

## 2016-08-01 DIAGNOSIS — K029 Dental caries, unspecified: Secondary | ICD-10-CM | POA: Insufficient documentation

## 2016-08-01 DIAGNOSIS — Z8673 Personal history of transient ischemic attack (TIA), and cerebral infarction without residual deficits: Secondary | ICD-10-CM | POA: Insufficient documentation

## 2016-08-01 DIAGNOSIS — Z85850 Personal history of malignant neoplasm of thyroid: Secondary | ICD-10-CM | POA: Insufficient documentation

## 2016-08-01 DIAGNOSIS — Z87891 Personal history of nicotine dependence: Secondary | ICD-10-CM | POA: Insufficient documentation

## 2016-08-01 DIAGNOSIS — Z7982 Long term (current) use of aspirin: Secondary | ICD-10-CM | POA: Insufficient documentation

## 2016-08-01 DIAGNOSIS — I10 Essential (primary) hypertension: Secondary | ICD-10-CM | POA: Insufficient documentation

## 2016-08-01 MED ORDER — CLINDAMYCIN HCL 300 MG PO CAPS: 300.0000 mg | ORAL_CAPSULE | Freq: Four times a day (QID) | ORAL | 0 refills | Status: DC

## 2016-08-01 MED ORDER — LIDOCAINE VISCOUS 2 % MT SOLN
15.0000 mL | Freq: Once | OROMUCOSAL | Status: AC
  Administered 2016-08-01: 15 mL via OROMUCOSAL
  Filled 2016-08-01: qty 15

## 2016-08-01 MED ORDER — CLINDAMYCIN HCL 150 MG PO CAPS
300.0000 mg | ORAL_CAPSULE | Freq: Once | ORAL | Status: AC
  Administered 2016-08-01: 300 mg via ORAL
  Filled 2016-08-01: qty 2

## 2016-08-01 NOTE — ED Triage Notes (Signed)
Pt in c/o L lower dental pain from broken tooth x 1 year but much worse lately. Was seen at [facility] today and given abx and narcotic pain med. Here for continued pain. Pt alert, interactive, ambulatory in NAD. Pt speech seems slow and slurred.

## 2016-08-01 NOTE — ED Provider Notes (Signed)
[facility] DEPT [facility] Provider Note   CSN: [id number] Arrival date & time: 08/01/16  2323  By signing my name below, I, Neta Mends, attest that this documentation has been prepared under the direction and in the presence of Toren Tucholski, MD . Electronically Signed: Neta Mends, ED Scribe. 08/01/2016. 11:50 PM.    History   Chief Complaint Chief Complaint  Patient presents with  . Dental Pain   The history is provided by the patient. No language interpreter was used.  Dental Pain   This is a chronic problem. The current episode started more than 1 week ago (1 year ago). The problem occurs constantly. The problem has been gradually worsening. The pain is severe ("excruciating"). Treatments tried: augmentin, amoxicillin, vicodin. The treatment provided no relief.    HPI Comments:  DARROL BABIN is a 40 y.o. male who presents to the Emergency Department complaining of worsening left lower dental pain x 1 year. Pt states that he broke a tooth a year and a half ago, and the pain has been recently worsening. Pt describes the pain as "excruciating," and notes that the pain is radiating in to his head. Pt was seen at [facility] and at [facility] for the same symptoms today and was given Augmentin and vicodin for his pain. Pt also started taking amoxicillin today. Per notes from earlier today, pt told [facility] that tooth was broken for 1 year, but told [facility] that the pain had only been going on for 1 day. No alleviating factors noted. Pt denies other associated symptoms.    Past Medical History:  Diagnosis Date  . Asthma   . Barrett's esophagus   . Drug abuse   . GI bleed   . Hodgkin disease ([place])   . Hodgkin's disease ([place])   . Hypertension   . Pulmonary fibrosis ([place])   . Stroke ([place])   . Thyroid cancer [facility])     Patient Active Problem List   Diagnosis Date Noted  . Stroke ([place]) 07/17/2015  . Substance induced mood disorder ([place])   . Cerebrovascular accident (CVA) [facility])      Past Surgical History:  Procedure Laterality Date  . PARATHYROIDECTOMY    . spleenectomy    . TEE WITHOUT CARDIOVERSION N/A 07/18/2015   Procedure: TRANSESOPHAGEAL ECHOCARDIOGRAM (TEE);  Surgeon: Thayer Headings, MD;  Location: [place];  Service: Cardiovascular;  Laterality: N/A;  . THYROIDECTOMY         Home Medications    Prior to Admission medications   Medication Sig Start Date End Date Taking? Authorizing Provider  albuterol (PROVENTIL HFA;VENTOLIN HFA) 108 (90 BASE) MCG/ACT inhaler Inhale 2 puffs into the lungs every 6 (six) hours as needed. For shortness of breath.    Historical Provider, MD  albuterol (PROVENTIL) (5 MG/ML) 0.5% nebulizer solution     Historical Provider, MD  amLODipine (NORVASC) 10 MG tablet  11/11/15   Historical Provider, MD  aspirin 325 MG tablet Take 1 tablet (325 mg total) by mouth daily. 07/18/15   Shanker Kristeen Mans, MD  buPROPion (WELLBUTRIN XL) 300 MG 24 hr tablet Take 300 mg by mouth daily.    Historical Provider, MD  etodolac (LODINE) 500 MG tablet Take 1 tablet (500 mg total) by mouth 2 (two) times daily. 03/27/16   Dorie Rank, MD  fluticasone (FLONASE) 50 MCG/ACT nasal spray Place into both nostrils daily.    Historical Provider, MD  Fluticasone-Salmeterol (ADVAIR) 250-50 MCG/DOSE AEPB Inhale 1 puff into the lungs every 12 (twelve)  hours.    Historical Provider, MD  levothyroxine (SYNTHROID, LEVOTHROID) 112 MCG tablet Take 224 mcg by mouth daily.    Historical Provider, MD  lisinopril (PRINIVIL,ZESTRIL) 20 MG tablet Take by mouth.    Historical Provider, MD  montelukast (SINGULAIR) 10 MG tablet  11/11/15   Historical Provider, MD  Omeprazole-Sodium Bicarbonate (ZEGERID) 20-1100 MG CAPS Take 1 capsule by mouth daily before breakfast.    Historical Provider, MD  rosuvastatin (CRESTOR) 40 MG tablet  11/11/15   Historical Provider, MD  sertraline (ZOLOFT) 100 MG tablet Take 100 mg by mouth daily.    Historical Provider, MD  traZODone (DESYREL) 150 MG  tablet Take by mouth at bedtime.    Historical Provider, MD    Family History Family History  Problem Relation Age of Onset  . Stroke Maternal Grandmother     Social History Social History  Substance Use Topics  . Smoking status: Former Smoker    Types: Cigarettes  . Smokeless tobacco: Never Used  . Alcohol use No     Comment: occasionally     Allergies   Ciprofloxacin; Thelma Barge officinalis]; and Fluocinolone   Review of Systems Review of Systems  Constitutional: Negative for fever.  HENT: Positive for dental problem. Negative for drooling and facial swelling.   All other systems reviewed and are negative.    Physical Exam Updated Vital Signs BP 139/96   Pulse 109   Temp 98.1 F (36.7 C)   Resp 18   Ht 6' (1.829 m)   Wt 220 lb (99.8 kg)   SpO2 97%   BMI 29.84 kg/m   Physical Exam  Constitutional: He is oriented to person, place, and time. He appears well-developed and well-nourished. No distress.  Pinpoint pupils, appears under the influence upon exam.   HENT:  Head: Normocephalic and atraumatic.  Mouth/Throat: Oropharynx is clear and moist. No oropharyngeal exudate.  Trachea midline. Cavities in left upper 1 through 3 molars.   Eyes: Conjunctivae and EOM are normal. Pupils are equal, round, and reactive to light.  Neck: Trachea normal and normal range of motion. Neck supple. No JVD present. Carotid bruit is not present.  Cardiovascular: Normal rate, regular rhythm, normal heart sounds and intact distal pulses.  Exam reveals no gallop and no friction rub.   No murmur heard. Pulmonary/Chest: Effort normal and breath sounds normal. No stridor. He has no wheezes. He has no rales.  No stridor, no bruits.  Abdominal: Soft. Bowel sounds are normal. He exhibits no mass. There is no tenderness. There is no rebound and no guarding.  Musculoskeletal: Normal range of motion.  Lymphadenopathy:    He has no cervical adenopathy.  Neurological: He is alert and  oriented to person, place, and time. He has normal reflexes. He displays normal reflexes. No cranial nerve deficit. He exhibits normal muscle tone. Coordination normal.  Cranial nerves 2-12 intact  Skin: Skin is warm and dry. Capillary refill takes less than 2 seconds. He is not diaphoretic.  Psychiatric: He has a normal mood and affect. His behavior is normal.     ED Treatments / Results  DIAGNOSTIC STUDIES:  Oxygen Saturation is 97% on RA, normal by my interpretation.    COORDINATION OF CARE:  11:49 PM Pt was informed that it is against policy to prescribe narcotics for dental pain. Discussed treatment plan with pt at bedside and pt agreed to plan.  Radiology No results found.  Procedures Procedures (including critical care time)  Medications Ordered in ED Medications  lidocaine (XYLOCAINE) 2 % viscous mouth solution 15 mL (not administered)  clindamycin (CLEOCIN) capsule 300 mg (not administered)    Given seen twice today and stating he has no insurance when he does I suspect he is drug seeking.  He already appears to be under the influence of narcotics and has pinpoint pupils it is not prudent to prescribe narcotics to this patient.  Follow up with dentistry  Final Clinical Impressions(s) / ED Diagnoses  Dental caries Polysubstance abuse  Drug seeking behavior  New Prescriptions New Prescriptions   No medications on file  All questions answered to patient's parents satisfaction. Based on history and exam patient has been appropriately medically screened and emergency conditions excluded. Patient is stable for discharge at this time. Follow up with your PMD for recheck in 2 days and strict return precautions given.  I personally performed the services described in this documentation, which was scribed in my presence. The recorded information has been reviewed and is accurate.      Catharine Kettlewell, MD 08/02/16 0000

## 2016-08-02 ENCOUNTER — Encounter (HOSPITAL_BASED_OUTPATIENT_CLINIC_OR_DEPARTMENT_OTHER): Payer: Self-pay | Admitting: Emergency Medicine

## 2016-08-02 NOTE — ED Notes (Signed)
Patient is in good condition; prescription meds reviewed; follow up care reviewed; pain management reviewed; patient verbalized understanding; patient is ambulatory and went home by himself

## 2016-08-25 DIAGNOSIS — K219 Gastro-esophageal reflux disease without esophagitis: Secondary | ICD-10-CM | POA: Insufficient documentation

## 2016-08-25 DIAGNOSIS — I10 Essential (primary) hypertension: Secondary | ICD-10-CM | POA: Insufficient documentation

## 2016-11-25 DIAGNOSIS — Z0271 Encounter for disability determination: Secondary | ICD-10-CM

## 2016-11-30 DIAGNOSIS — C4491 Basal cell carcinoma of skin, unspecified: Secondary | ICD-10-CM | POA: Insufficient documentation

## 2016-11-30 DIAGNOSIS — Z85828 Personal history of other malignant neoplasm of skin: Secondary | ICD-10-CM | POA: Insufficient documentation

## 2017-03-25 ENCOUNTER — Emergency Department (HOSPITAL_BASED_OUTPATIENT_CLINIC_OR_DEPARTMENT_OTHER): Payer: Self-pay

## 2017-03-25 ENCOUNTER — Encounter (HOSPITAL_COMMUNITY): Admission: EM | Disposition: A | Discharge status: Home / Self Care | Payer: Self-pay | Source: Home / Self Care

## 2017-03-25 ENCOUNTER — Ambulatory Visit (HOSPITAL_COMMUNITY): Payer: Self-pay | Admitting: Anesthesiology

## 2017-03-25 ENCOUNTER — Inpatient Hospital Stay (HOSPITAL_BASED_OUTPATIENT_CLINIC_OR_DEPARTMENT_OTHER)
Admission: EM | Admit: 2017-03-25 | Discharge: 2017-03-28 | DRG: 337 | Disposition: A | Discharge status: Home / Self Care | Payer: Self-pay | Attending: Surgery | Admitting: Surgery

## 2017-03-25 ENCOUNTER — Encounter (HOSPITAL_BASED_OUTPATIENT_CLINIC_OR_DEPARTMENT_OTHER): Payer: Self-pay | Admitting: Emergency Medicine

## 2017-03-25 DIAGNOSIS — Z8673 Personal history of transient ischemic attack (TIA), and cerebral infarction without residual deficits: Secondary | ICD-10-CM

## 2017-03-25 DIAGNOSIS — Z9049 Acquired absence of other specified parts of digestive tract: Secondary | ICD-10-CM

## 2017-03-25 DIAGNOSIS — K3580 Unspecified acute appendicitis: Principal | ICD-10-CM | POA: Diagnosis present

## 2017-03-25 DIAGNOSIS — Z923 Personal history of irradiation: Secondary | ICD-10-CM

## 2017-03-25 DIAGNOSIS — J8410 Pulmonary fibrosis, unspecified: Secondary | ICD-10-CM | POA: Diagnosis present

## 2017-03-25 DIAGNOSIS — B1920 Unspecified viral hepatitis C without hepatic coma: Secondary | ICD-10-CM | POA: Diagnosis present

## 2017-03-25 DIAGNOSIS — J45909 Unspecified asthma, uncomplicated: Secondary | ICD-10-CM | POA: Diagnosis present

## 2017-03-25 DIAGNOSIS — I10 Essential (primary) hypertension: Secondary | ICD-10-CM | POA: Diagnosis present

## 2017-03-25 DIAGNOSIS — K37 Unspecified appendicitis: Secondary | ICD-10-CM | POA: Diagnosis present

## 2017-03-25 DIAGNOSIS — Z87891 Personal history of nicotine dependence: Secondary | ICD-10-CM

## 2017-03-25 DIAGNOSIS — Z85850 Personal history of malignant neoplasm of thyroid: Secondary | ICD-10-CM

## 2017-03-25 DIAGNOSIS — F319 Bipolar disorder, unspecified: Secondary | ICD-10-CM | POA: Diagnosis present

## 2017-03-25 DIAGNOSIS — K2270 Barrett's esophagus without dysplasia: Secondary | ICD-10-CM | POA: Diagnosis present

## 2017-03-25 DIAGNOSIS — Z8571 Personal history of Hodgkin lymphoma: Secondary | ICD-10-CM

## 2017-03-25 DIAGNOSIS — Z9081 Acquired absence of spleen: Secondary | ICD-10-CM

## 2017-03-25 DIAGNOSIS — K353 Acute appendicitis with localized peritonitis, without perforation or gangrene: Secondary | ICD-10-CM

## 2017-03-25 DIAGNOSIS — K660 Peritoneal adhesions (postprocedural) (postinfection): Secondary | ICD-10-CM | POA: Diagnosis present

## 2017-03-25 HISTORY — PX: LAPAROSCOPIC APPENDECTOMY: SHX408

## 2017-03-25 HISTORY — DX: Inflammatory liver disease, unspecified: K75.9

## 2017-03-25 HISTORY — DX: Bipolar disorder, unspecified: F31.9

## 2017-03-25 LAB — CBC WITH DIFFERENTIAL/PLATELET
BASOS PCT: 0 %
Basophils Absolute: 0 10*3/uL (ref 0.0–0.1)
Eosinophils Absolute: 0.9 10*3/uL — ABNORMAL HIGH (ref 0.0–0.7)
Eosinophils Relative: 6 %
HEMATOCRIT: 38.4 % — AB (ref 39.0–52.0)
HEMOGLOBIN: 13.4 g/dL (ref 13.0–17.0)
LYMPHS PCT: 10 %
Lymphs Abs: 1.6 10*3/uL (ref 0.7–4.0)
MCH: 32.4 pg (ref 26.0–34.0)
MCHC: 34.9 g/dL (ref 30.0–36.0)
MCV: 93 fL (ref 78.0–100.0)
MONOS PCT: 9 %
Monocytes Absolute: 1.4 10*3/uL — ABNORMAL HIGH (ref 0.1–1.0)
NEUTROS ABS: 11.6 10*3/uL — AB (ref 1.7–7.7)
NEUTROS PCT: 75 %
Platelets: 277 10*3/uL (ref 150–400)
RBC: 4.13 MIL/uL — ABNORMAL LOW (ref 4.22–5.81)
RDW: 13.9 % (ref 11.5–15.5)
WBC: 15.6 10*3/uL — ABNORMAL HIGH (ref 4.0–10.5)

## 2017-03-25 LAB — COMPREHENSIVE METABOLIC PANEL
ALBUMIN: 4 g/dL (ref 3.5–5.0)
ALK PHOS: 54 U/L (ref 38–126)
ALT: 14 U/L — ABNORMAL LOW (ref 17–63)
ANION GAP: 9 (ref 5–15)
AST: 19 U/L (ref 15–41)
BILIRUBIN TOTAL: 0.5 mg/dL (ref 0.3–1.2)
BUN: 12 mg/dL (ref 6–20)
CALCIUM: 8.8 mg/dL — AB (ref 8.9–10.3)
CO2: 24 mmol/L (ref 22–32)
Chloride: 103 mmol/L (ref 101–111)
Creatinine, Ser: 1.09 mg/dL (ref 0.61–1.24)
GFR calc Af Amer: >60 mL/min (ref >60)
GFR calc non Af Amer: >60 mL/min (ref >60)
GLUCOSE: 113 mg/dL — AB (ref 65–99)
Potassium: 4.1 mmol/L (ref 3.5–5.1)
Sodium: 136 mmol/L (ref 135–145)
TOTAL PROTEIN: 7 g/dL (ref 6.5–8.1)

## 2017-03-25 LAB — URINALYSIS, ROUTINE W REFLEX MICROSCOPIC
Bilirubin Urine: NEGATIVE
Glucose, UA: NEGATIVE mg/dL
Hgb urine dipstick: NEGATIVE
Ketones, ur: NEGATIVE mg/dL
Leukocytes, UA: NEGATIVE
NITRITE: NEGATIVE
PH: 7.5 (ref 5.0–8.0)
Protein, ur: NEGATIVE mg/dL
SPECIFIC GRAVITY, URINE: 1.025 (ref 1.005–1.030)

## 2017-03-25 LAB — LIPASE, BLOOD: Lipase: 21 U/L (ref 11–51)

## 2017-03-25 SURGERY — APPENDECTOMY, LAPAROSCOPIC
Anesthesia: General

## 2017-03-25 MED ORDER — SODIUM CHLORIDE 0.9 % IJ SOLN
INTRAMUSCULAR | Status: AC
  Filled 2017-03-25: qty 10

## 2017-03-25 MED ORDER — DEXAMETHASONE SODIUM PHOSPHATE 4 MG/ML IJ SOLN
INTRAMUSCULAR | Status: DC | PRN
  Administered 2017-03-25: 10 mg via INTRAVENOUS

## 2017-03-25 MED ORDER — LACTATED RINGERS IR SOLN
Status: DC | PRN
  Administered 2017-03-25: 1000 mL

## 2017-03-25 MED ORDER — ACETAZOLAMIDE ER 500 MG PO CP12
500.0000 mg | ORAL_CAPSULE | Freq: Two times a day (BID) | ORAL | Status: DC
  Filled 2017-03-25 (×2): qty 1

## 2017-03-25 MED ORDER — ROCURONIUM BROMIDE 50 MG/5ML IV SOSY
PREFILLED_SYRINGE | INTRAVENOUS | Status: AC
  Filled 2017-03-25: qty 5

## 2017-03-25 MED ORDER — SUCCINYLCHOLINE CHLORIDE 200 MG/10ML IV SOSY
PREFILLED_SYRINGE | INTRAVENOUS | Status: AC
  Filled 2017-03-25: qty 10

## 2017-03-25 MED ORDER — HYDROMORPHONE HCL 1 MG/ML IJ SOLN
0.2500 mg | INTRAMUSCULAR | Status: DC | PRN
Start: 2017-03-25 — End: 2017-03-25
  Administered 2017-03-25 (×3): 0.5 mg via INTRAVENOUS

## 2017-03-25 MED ORDER — PROPOFOL 10 MG/ML IV BOLUS
INTRAVENOUS | Status: AC
  Filled 2017-03-25: qty 40

## 2017-03-25 MED ORDER — DEXAMETHASONE SODIUM PHOSPHATE 10 MG/ML IJ SOLN
INTRAMUSCULAR | Status: AC
  Filled 2017-03-25: qty 1

## 2017-03-25 MED ORDER — IOPAMIDOL (ISOVUE-300) INJECTION 61%
100.0000 mL | Freq: Once | INTRAVENOUS | Status: AC | PRN
Start: 2017-03-25 — End: 2017-03-25
  Administered 2017-03-25: 100 mL via INTRAVENOUS

## 2017-03-25 MED ORDER — SUGAMMADEX SODIUM 200 MG/2ML IV SOLN
INTRAVENOUS | Status: AC
  Filled 2017-03-25: qty 2

## 2017-03-25 MED ORDER — BUPIVACAINE-EPINEPHRINE (PF) 0.25% -1:200000 IJ SOLN
INTRAMUSCULAR | Status: AC
  Filled 2017-03-25: qty 30

## 2017-03-25 MED ORDER — FENTANYL CITRATE (PF) 100 MCG/2ML IJ SOLN
INTRAMUSCULAR | Status: AC
  Filled 2017-03-25: qty 2

## 2017-03-25 MED ORDER — ONDANSETRON HCL 4 MG/2ML IJ SOLN
INTRAMUSCULAR | Status: AC
  Filled 2017-03-25: qty 2

## 2017-03-25 MED ORDER — FENTANYL CITRATE (PF) 100 MCG/2ML IJ SOLN
50.0000 ug | Freq: Once | INTRAMUSCULAR | Status: AC
  Administered 2017-03-25: 50 ug via INTRAVENOUS
  Filled 2017-03-25: qty 2

## 2017-03-25 MED ORDER — MIDAZOLAM HCL 5 MG/5ML IJ SOLN
INTRAMUSCULAR | Status: DC | PRN
  Administered 2017-03-25: 2 mg via INTRAVENOUS

## 2017-03-25 MED ORDER — HYDROMORPHONE HCL 1 MG/ML IJ SOLN
INTRAMUSCULAR | Status: AC
  Administered 2017-03-25: 0.5 mg via INTRAVENOUS
  Filled 2017-03-25: qty 1

## 2017-03-25 MED ORDER — PANTOPRAZOLE SODIUM 40 MG IV SOLR
40.0000 mg | Freq: Every day | INTRAVENOUS | Status: DC
  Administered 2017-03-25: 23:00:00 40 mg via INTRAVENOUS
  Filled 2017-03-25: qty 40

## 2017-03-25 MED ORDER — MIDAZOLAM HCL 2 MG/2ML IJ SOLN
INTRAMUSCULAR | Status: AC
  Filled 2017-03-25: qty 2

## 2017-03-25 MED ORDER — SUCCINYLCHOLINE CHLORIDE 20 MG/ML IJ SOLN
INTRAMUSCULAR | Status: DC | PRN
  Administered 2017-03-25: 120 mg via INTRAVENOUS

## 2017-03-25 MED ORDER — HYDROMORPHONE HCL 1 MG/ML IJ SOLN
INTRAMUSCULAR | Status: DC | PRN
  Administered 2017-03-25: 1 mg via INTRAVENOUS

## 2017-03-25 MED ORDER — ONDANSETRON 4 MG PO TBDP
4.0000 mg | ORAL_TABLET | Freq: Four times a day (QID) | ORAL | Status: DC | PRN
  Administered 2017-03-26: 4 mg via ORAL
  Filled 2017-03-25: qty 1

## 2017-03-25 MED ORDER — LACTATED RINGERS IV SOLN
INTRAVENOUS | Status: DC
  Administered 2017-03-25 (×2): via INTRAVENOUS

## 2017-03-25 MED ORDER — KCL IN DEXTROSE-NACL 20-5-0.45 MEQ/L-%-% IV SOLN
INTRAVENOUS | Status: DC
  Administered 2017-03-25 – 2017-03-28 (×3): via INTRAVENOUS
  Filled 2017-03-25 (×5): qty 1000

## 2017-03-25 MED ORDER — ONDANSETRON HCL 4 MG/2ML IJ SOLN
INTRAMUSCULAR | Status: DC | PRN
  Administered 2017-03-25: 4 mg via INTRAVENOUS

## 2017-03-25 MED ORDER — PHENYLEPHRINE HCL 10 MG/ML IJ SOLN
INTRAMUSCULAR | Status: DC | PRN
  Administered 2017-03-25: 80 ug via INTRAVENOUS

## 2017-03-25 MED ORDER — ALBUTEROL SULFATE (2.5 MG/3ML) 0.083% IN NEBU
2.5000 mg | INHALATION_SOLUTION | Freq: Four times a day (QID) | RESPIRATORY_TRACT | Status: DC | PRN
  Administered 2017-03-25: 2.5 mg via RESPIRATORY_TRACT
  Filled 2017-03-25: qty 3

## 2017-03-25 MED ORDER — DIPHENHYDRAMINE HCL 25 MG PO CAPS
25.0000 mg | ORAL_CAPSULE | Freq: Every evening | ORAL | Status: DC | PRN
  Administered 2017-03-25: 25 mg via ORAL
  Filled 2017-03-25: qty 1

## 2017-03-25 MED ORDER — ALBUTEROL SULFATE (2.5 MG/3ML) 0.083% IN NEBU
2.5000 mg | INHALATION_SOLUTION | RESPIRATORY_TRACT | Status: DC | PRN
  Administered 2017-03-26 – 2017-03-27 (×4): 2.5 mg via RESPIRATORY_TRACT
  Filled 2017-03-25 (×4): qty 3

## 2017-03-25 MED ORDER — MONTELUKAST SODIUM 10 MG PO TABS
10.0000 mg | ORAL_TABLET | Freq: Every day | ORAL | Status: DC
  Filled 2017-03-25: qty 1

## 2017-03-25 MED ORDER — SODIUM CHLORIDE 0.9 % IJ SOLN
INTRAMUSCULAR | Status: AC
  Filled 2017-03-25: qty 50

## 2017-03-25 MED ORDER — SODIUM CHLORIDE 0.9 % IV BOLUS (SEPSIS)
1000.0000 mL | Freq: Once | INTRAVENOUS | Status: AC
  Administered 2017-03-25: 1000 mL via INTRAVENOUS

## 2017-03-25 MED ORDER — LIDOCAINE HCL (CARDIAC) 20 MG/ML IV SOLN
INTRAVENOUS | Status: DC | PRN
  Administered 2017-03-25: 80 mg via INTRAVENOUS

## 2017-03-25 MED ORDER — MORPHINE SULFATE (PF) 2 MG/ML IV SOLN
1.0000 mg | INTRAVENOUS | Status: DC | PRN
  Administered 2017-03-25 – 2017-03-26 (×4): 1 mg via INTRAVENOUS
  Filled 2017-03-25 (×4): qty 1

## 2017-03-25 MED ORDER — PROPOFOL 10 MG/ML IV BOLUS
INTRAVENOUS | Status: DC | PRN
  Administered 2017-03-25: 70 mg via INTRAVENOUS
  Administered 2017-03-25: 200 mg via INTRAVENOUS

## 2017-03-25 MED ORDER — ALBUTEROL SULFATE HFA 108 (90 BASE) MCG/ACT IN AERS: 2.0000 | INHALATION_SPRAY | Freq: Four times a day (QID) | RESPIRATORY_TRACT | Status: DC | PRN

## 2017-03-25 MED ORDER — PIPERACILLIN-TAZOBACTAM 3.375 G IVPB 30 MIN
3.3750 g | Freq: Three times a day (TID) | INTRAVENOUS | Status: AC
  Administered 2017-03-25: 23:00:00 3.375 g via INTRAVENOUS
  Filled 2017-03-25: qty 50

## 2017-03-25 MED ORDER — ONDANSETRON HCL 4 MG/2ML IJ SOLN
4.0000 mg | Freq: Once | INTRAMUSCULAR | Status: AC
  Administered 2017-03-25: 4 mg via INTRAVENOUS
  Filled 2017-03-25: qty 2

## 2017-03-25 MED ORDER — ROCURONIUM BROMIDE 100 MG/10ML IV SOLN
INTRAVENOUS | Status: DC | PRN
  Administered 2017-03-25: 40 mg via INTRAVENOUS

## 2017-03-25 MED ORDER — PIPERACILLIN-TAZOBACTAM 3.375 G IVPB
INTRAVENOUS | Status: AC
  Filled 2017-03-25: qty 50

## 2017-03-25 MED ORDER — ONDANSETRON HCL 4 MG/2ML IJ SOLN: 4.0000 mg | Freq: Four times a day (QID) | INTRAMUSCULAR | Status: DC | PRN

## 2017-03-25 MED ORDER — HYDROMORPHONE HCL 1 MG/ML IJ SOLN
0.2500 mg | INTRAMUSCULAR | Status: DC | PRN
  Administered 2017-03-25 (×4): 0.5 mg via INTRAVENOUS

## 2017-03-25 MED ORDER — TRAZODONE HCL 50 MG PO TABS
150.0000 mg | ORAL_TABLET | Freq: Every evening | ORAL | Status: DC | PRN
  Administered 2017-03-25: 150 mg via ORAL
  Filled 2017-03-25: qty 1

## 2017-03-25 MED ORDER — BUPIVACAINE LIPOSOME 1.3 % IJ SUSP
20.0000 mL | Freq: Once | INTRAMUSCULAR | Status: AC
  Administered 2017-03-25: 20 mL
  Filled 2017-03-25: qty 20

## 2017-03-25 MED ORDER — PIPERACILLIN-TAZOBACTAM 3.375 G IVPB 30 MIN
3.3750 g | Freq: Once | INTRAVENOUS | Status: AC
  Administered 2017-03-25: 3.375 g via INTRAVENOUS
  Filled 2017-03-25: qty 50

## 2017-03-25 MED ORDER — AMLODIPINE BESYLATE 10 MG PO TABS
10.0000 mg | ORAL_TABLET | Freq: Every day | ORAL | Status: DC
  Filled 2017-03-25: qty 1

## 2017-03-25 MED ORDER — HEPARIN SODIUM (PORCINE) 5000 UNIT/ML IJ SOLN
5000.0000 [IU] | Freq: Three times a day (TID) | INTRAMUSCULAR | Status: DC
  Administered 2017-03-26 – 2017-03-27 (×5): 5000 [IU] via SUBCUTANEOUS
  Filled 2017-03-25 (×8): qty 1

## 2017-03-25 MED ORDER — SUGAMMADEX SODIUM 200 MG/2ML IV SOLN
INTRAVENOUS | Status: DC | PRN
Start: 2017-03-25 — End: 2017-03-25
  Administered 2017-03-25: 400 mg via INTRAVENOUS

## 2017-03-25 MED ORDER — HYDROMORPHONE HCL 1 MG/ML IJ SOLN
INTRAMUSCULAR | Status: AC
  Filled 2017-03-25: qty 1

## 2017-03-25 MED ORDER — GLYCOPYRROLATE 0.2 MG/ML IV SOSY
PREFILLED_SYRINGE | INTRAVENOUS | Status: AC
  Filled 2017-03-25: qty 5

## 2017-03-25 MED ORDER — MORPHINE SULFATE (PF) 4 MG/ML IV SOLN
4.0000 mg | Freq: Once | INTRAVENOUS | Status: AC
  Administered 2017-03-25: 4 mg via INTRAVENOUS
  Filled 2017-03-25: qty 1

## 2017-03-25 MED ORDER — FENTANYL CITRATE (PF) 100 MCG/2ML IJ SOLN
INTRAMUSCULAR | Status: DC | PRN
  Administered 2017-03-25 (×6): 50 ug via INTRAVENOUS
  Administered 2017-03-25: 100 ug via INTRAVENOUS

## 2017-03-25 MED ORDER — HYDROMORPHONE HCL 2 MG/ML IJ SOLN
INTRAMUSCULAR | Status: AC
  Filled 2017-03-25: qty 1

## 2017-03-25 MED ORDER — LIDOCAINE 2% (20 MG/ML) 5 ML SYRINGE
INTRAMUSCULAR | Status: AC
  Filled 2017-03-25: qty 5

## 2017-03-25 SURGICAL SUPPLY — 44 items
ADH SKN CLS APL DERMABOND .7 (GAUZE/BANDAGES/DRESSINGS) ×1
APPLIER CLIP ROT 10 11.4 M/L (STAPLE)
APR CLP MED LRG 11.4X10 (STAPLE)
BAG SPEC RTRVL 10 TROC 200 (ENDOMECHANICALS) ×1
BAG SPEC RTRVL LRG 6X4 10 (ENDOMECHANICALS) ×1
CABLE HIGH FREQUENCY MONO STRZ (ELECTRODE) ×2 IMPLANT
CHLORAPREP W/TINT 26ML (MISCELLANEOUS) ×3 IMPLANT
CLIP APPLIE ROT 10 11.4 M/L (STAPLE) IMPLANT
COVER SURGICAL LIGHT HANDLE (MISCELLANEOUS) ×3 IMPLANT
CUTTER FLEX LINEAR 45M (STAPLE) ×3 IMPLANT
DECANTER SPIKE VIAL GLASS SM (MISCELLANEOUS) IMPLANT
DERMABOND ADVANCED (GAUZE/BANDAGES/DRESSINGS) ×2
DERMABOND ADVANCED .7 DNX12 (GAUZE/BANDAGES/DRESSINGS) ×1 IMPLANT
DRAPE LAPAROSCOPIC ABDOMINAL (DRAPES) ×2 IMPLANT
ELECT REM PT RETURN 15FT ADLT (MISCELLANEOUS) ×3 IMPLANT
ENDOLOOP SUT PDS II  0 18 (SUTURE) ×2
ENDOLOOP SUT PDS II 0 18 (SUTURE) IMPLANT
GLOVE BIOGEL M 8.0 STRL (GLOVE) ×3 IMPLANT
GOWN STRL REUS W/TWL XL LVL3 (GOWN DISPOSABLE) ×3 IMPLANT
IRRIG SUCT STRYKERFLOW 2 WTIP (MISCELLANEOUS)
IRRIGATION SUCT STRKRFLW 2 WTP (MISCELLANEOUS) ×1 IMPLANT
KIT BASIN OR (CUSTOM PROCEDURE TRAY) ×3 IMPLANT
PAD POSITIONING PINK XL (MISCELLANEOUS) ×3 IMPLANT
POUCH RETRIEVAL ECOSAC 10 (ENDOMECHANICALS) IMPLANT
POUCH RETRIEVAL ECOSAC 10MM (ENDOMECHANICALS) ×2
POUCH SPECIMEN RETRIEVAL 10MM (ENDOMECHANICALS) ×3 IMPLANT
RELOAD 45 VASCULAR/THIN (ENDOMECHANICALS) ×3 IMPLANT
RELOAD STAPLE 45 2.5 WHT GRN (ENDOMECHANICALS) IMPLANT
RELOAD STAPLE 45 3.5 BLU ETS (ENDOMECHANICALS) IMPLANT
RELOAD STAPLE TA45 3.5 REG BLU (ENDOMECHANICALS) IMPLANT
SCISSORS LAP 5X45 EPIX DISP (ENDOMECHANICALS) ×3 IMPLANT
SET IRRIG TUBING LAPAROSCOPIC (IRRIGATION / IRRIGATOR) ×2 IMPLANT
SHEARS HARMONIC ACE PLUS 45CM (MISCELLANEOUS) ×3 IMPLANT
SLEEVE XCEL OPT CAN 5 100 (ENDOMECHANICALS) ×5 IMPLANT
STAPLER VISISTAT 35W (STAPLE) IMPLANT
SUT MNCRL AB 4-0 PS2 18 (SUTURE) ×5 IMPLANT
TOWEL OR 17X26 10 PK STRL BLUE (TOWEL DISPOSABLE) ×3 IMPLANT
TRAY FOLEY W/METER SILVER 14FR (SET/KITS/TRAYS/PACK) IMPLANT
TRAY FOLEY W/METER SILVER 16FR (SET/KITS/TRAYS/PACK) ×2 IMPLANT
TRAY LAPAROSCOPIC (CUSTOM PROCEDURE TRAY) ×3 IMPLANT
TROCAR BLADELESS OPT 5 100 (ENDOMECHANICALS) ×3 IMPLANT
TROCAR XCEL BLUNT TIP 100MML (ENDOMECHANICALS) ×5 IMPLANT
TROCAR XCEL NON-BLD 11X100MML (ENDOMECHANICALS) IMPLANT
TUBING INSUF HEATED (TUBING) ×3 IMPLANT

## 2017-03-25 NOTE — Transfer of Care (Signed)
Immediate Anesthesia Transfer of Care Note  Patient: Troy [name]  Procedure(s) Performed: Procedure(s): APPENDECTOMY LAPAROSCOPIC; EXTENSIVE LYSIS OF ADHESIONS FROM PRIOR RADIATION AND SURGERY (N/A)  Patient Location: PACU  Anesthesia Type:General  Level of Consciousness:  sedated, patient cooperative and responds to stimulation  Airway & Oxygen Therapy:Patient Spontanous Breathing and Patient connected to face mask oxgen  Post-op Assessment:  Report given to PACU RN and Post -op Vital signs reviewed and stable  Post vital signs:  Reviewed and stable  Last Vitals:  Vitals:   03/25/17 1113 03/25/17 1211  BP: 123/83 124/74  Pulse: 88 85  Resp: 16 18  Temp: 36.9 C 56.7 C    Complications: No apparent anesthesia complications

## 2017-03-25 NOTE — ED Notes (Signed)
Drinking CT contrast . Mom at bedside

## 2017-03-25 NOTE — ED Notes (Signed)
ED Provider at bedside. 

## 2017-03-25 NOTE — H&P (Signed)
Chief Complaint:  Right lower quadrant pain since last night. Appendicitis Diagnosed [facility] [place]  History of Present Illness:  Troy [name] is an 41 y.o. male [facility]. I spoke with the attending physician there and he had a cute appendicitis with a white count of over 15,000. Arranged for made for him to come by private vehicle to [facility] where he was taken to preop holding area for my evaluation.  His past medical history is remarkable and he had Hodgkin's disease requiring exploratory laparotomy splenectomy in the early 90s. His more recent history is important in that he has had a relatively recent stroke of unknown etiology.  He denies any history of DVT. He has had problems with ciprofloxacin in the past. He did have mantle radiation for his Hodgkin's disease and has developed some degree of pulmonary fibrosis  Past Medical History:  Diagnosis Date  . Asthma   . Barrett's esophagus   . Bipolar 1 disorder ([place])   . Drug abuse   . GI bleed   . Hepatitis    Hep C,   . Hodgkin disease ([place])   . Hodgkin's disease ([place])   . Hypertension   . Pulmonary fibrosis ([place])   . Stroke ([place])   . Thyroid cancer [facility])     Past Surgical History:  Procedure Laterality Date  . PARATHYROIDECTOMY    . spleenectomy    . TEE WITHOUT CARDIOVERSION N/A 07/18/2015   Procedure: TRANSESOPHAGEAL ECHOCARDIOGRAM (TEE);  Surgeon: Thayer Headings, MD;  Location: [place];  Service: Cardiovascular;  Laterality: N/A;  . THYROIDECTOMY      Current Facility-Administered Medications  Medication Dose Route Frequency Provider Last Rate Last Dose  . lactated ringers infusion   Intravenous Continuous Lyndle Herrlich, MD       Ciprofloxacin; Thelma Barge officinalis]; and Fluocinolone Family History  Problem Relation Age of Onset  . Stroke Maternal Grandmother    Social History:   reports that he has quit smoking. His smoking use included Cigarettes. He has never  used smokeless tobacco. He reports that he drinks alcohol. He reports that he uses drugs, including Marijuana.   REVIEW OF SYSTEMS : Negative except for See problem list  Physical Exam:   Blood pressure 124/74, pulse 85, temperature 97.8 F (36.6 C), temperature source Oral, resp. rate 18, height 6' (1.829 m), weight 102.1 kg (225 lb), SpO2 100 %. Body mass index is 30.52 kg/m.  Gen:  WDWN white male NAD  Neurological: Alert and oriented to person, place, and time. Motor and sensory function is grossly intact  Head: Normocephalic and atraumatic.  Eyes: Conjunctivae are normal. Pupils are equal, round, and reactive to light. No scleral icterus.  Neck: Normal range of motion. Neck supple. No tracheal deviation or thyromegaly present.  Cardiovascular:  SR without murmurs or gallops.  No carotid bruits Breast:  Not examined Respiratory: Effort normal.  No respiratory distress. No chest wall tenderness. Breath sounds normal.  No wheezes, rales or rhonchi.  Abdomen:  Midline incision from below xiphoid to below the umbilicus. Right lower quadrant tenderness GU:  Male genitalia Musculoskeletal: Normal range of motion. Extremities are nontender. No cyanosis, edema or clubbing noted Lymphadenopathy: No cervical, preauricular, postauricular or axillary adenopathy is present Skin: Skin is warm and dry. No rash noted. No diaphoresis. No erythema. No pallor. Pscyh: Normal mood and affect. Behavior is normal. Judgment and thought content normal.   LABORATORY RESULTS: Results for orders placed or performed  during the hospital encounter of 03/25/17 (from the past 48 hour(s))  CBC with Differential     Status: Abnormal   Collection Time: 03/25/17  9:09 AM  Result Value Ref Range   WBC 15.6 (H) 4.0 - 10.5 K/uL   RBC 4.13 (L) 4.22 - 5.81 MIL/uL   Hemoglobin 13.4 13.0 - 17.0 g/dL   HCT 38.4 (L) 39.0 - 52.0 %   MCV 93.0 78.0 - 100.0 fL   MCH 32.4 26.0 - 34.0 pg   MCHC 34.9 30.0 - 36.0 g/dL   RDW  13.9 11.5 - 15.5 %   Platelets 277 150 - 400 K/uL   Neutrophils Relative % 75 %   Neutro Abs 11.6 (H) 1.7 - 7.7 K/uL   Lymphocytes Relative 10 %   Lymphs Abs 1.6 0.7 - 4.0 K/uL   Monocytes Relative 9 %   Monocytes Absolute 1.4 (H) 0.1 - 1.0 K/uL   Eosinophils Relative 6 %   Eosinophils Absolute 0.9 (H) 0.0 - 0.7 K/uL   Basophils Relative 0 %   Basophils Absolute 0.0 0.0 - 0.1 K/uL  Comprehensive metabolic panel     Status: Abnormal   Collection Time: 03/25/17  9:09 AM  Result Value Ref Range   Sodium 136 135 - 145 mmol/L   Potassium 4.1 3.5 - 5.1 mmol/L   Chloride 103 101 - 111 mmol/L   CO2 24 22 - 32 mmol/L   Glucose, Bld 113 (H) 65 - 99 mg/dL   BUN 12 6 - 20 mg/dL   Creatinine, Ser 1.09 0.61 - 1.24 mg/dL   Calcium 8.8 (L) 8.9 - 10.3 mg/dL   Total Protein 7.0 6.5 - 8.1 g/dL   Albumin 4.0 3.5 - 5.0 g/dL   AST 19 15 - 41 U/L   ALT 14 (L) 17 - 63 U/L   Alkaline Phosphatase 54 38 - 126 U/L   Total Bilirubin 0.5 0.3 - 1.2 mg/dL   GFR calc non Af Amer >60 >60 mL/min   GFR calc Af Amer >60 >60 mL/min    Comment: (NOTE) The eGFR has been calculated using the CKD EPI equation. This calculation has not been validated in all clinical situations. eGFR's persistently <60 mL/min signify possible Chronic Kidney Disease.    Anion gap 9 5 - 15  Lipase, blood     Status: None   Collection Time: 03/25/17  9:09 AM  Result Value Ref Range   Lipase 21 11 - 51 U/L  Urinalysis, Routine w reflex microscopic     Status: None   Collection Time: 03/25/17 11:00 AM  Result Value Ref Range   Color, Urine YELLOW YELLOW   APPearance CLEAR CLEAR   Specific Gravity, Urine 1.025 1.005 - 1.030   pH 7.5 5.0 - 8.0   Glucose, UA NEGATIVE NEGATIVE mg/dL   Hgb urine dipstick NEGATIVE NEGATIVE   Bilirubin Urine NEGATIVE NEGATIVE   Ketones, ur NEGATIVE NEGATIVE mg/dL   Protein, ur NEGATIVE NEGATIVE mg/dL   Nitrite NEGATIVE NEGATIVE   Leukocytes, UA NEGATIVE NEGATIVE    Comment: Microscopic not done on  urines with negative protein, blood, leukocytes, nitrite, or glucose < 500 mg/dL.     RADIOLOGY RESULTS: Ct Abdomen Pelvis W Contrast  Result Date: 03/25/2017 CLINICAL DATA:  Right-sided abdominal pain since yesterday. EXAM: CT ABDOMEN AND PELVIS WITH CONTRAST TECHNIQUE: Multidetector CT imaging of the abdomen and pelvis was performed using the standard protocol following bolus administration of intravenous contrast. CONTRAST:  123m ISOVUE-300 IOPAMIDOL (ISOVUE-300) INJECTION 61%  COMPARISON:  11/02/2015 FINDINGS: Lower chest:  Unremarkable. Hepatobiliary: No focal abnormality within the liver parenchyma. There is no evidence for gallstones, gallbladder wall thickening, or pericholecystic fluid. No intrahepatic or extrahepatic biliary dilation. Pancreas: No focal mass lesion. No dilatation of the main duct. No intraparenchymal cyst. No peripancreatic edema. Spleen: Spleen is surgically absent. Adrenals/Urinary Tract: No adrenal nodule or mass. Kidneys are unremarkable. Tiny subcapsular cyst noted posterior interpolar right kidney. No evidence for hydroureter. The urinary bladder appears normal for the degree of distention. Stomach/Bowel: Tiny hiatal hernia. Stomach otherwise unremarkable. Duodenum is normally positioned as is the ligament of Treitz. No small bowel wall thickening. No small bowel dilatation. The terminal ileum is normal. The appendix is dilated up to 14 mm diameter. Appendiceal anatomy is elongated. There is associated periappendiceal edema/inflammation. No extraluminal gas or abscess in the right lower quadrant. No gross colonic mass. No colonic wall thickening. No substantial diverticular change. Vascular/Lymphatic: There is abdominal aortic atherosclerosis without aneurysm. Mild lymphadenopathy in the ileocolic ligament is stable. No retroperitoneal lymphadenopathy. No pelvic sidewall lymphadenopathy. Reproductive: The prostate gland and seminal vesicles have normal imaging features. Other:  A trace amount of free fluid is seen in the pelvis. Musculoskeletal: Bone windows reveal no worrisome lytic or sclerotic osseous lesions. IMPRESSION: 1. Acute appendicitis without evidence for perforation. No CT findings to suggest abscess at this time. There is a trace amount of fluid in the anatomic pelvis an adjacent to the inflamed appendix. I personally called these results directly to Dr.Mackuen at 1053 hours on 03/25/2017. Electronically Signed   By: Misty Stanley M.D.   On: 03/25/2017 10:53    Problem List: Patient Active Problem List   Diagnosis Date Noted  . Stroke ([place]) 07/17/2015  . Substance induced mood disorder ([place])   . Cerebrovascular accident (CVA) [facility])     Assessment & Plan: Acute appendicitis a CT scan and physical exam. No evidence of rupture. Plan laparoscopic appendectomy    Matt B. Hassell Done, MD, [facility] [phone] beeper [phone]  03/25/2017 2:01 PM

## 2017-03-25 NOTE — ED Triage Notes (Addendum)
Pain to right side since yesterday. Hx of Hep C, finished treatment in March and states is "cured " . Also has n/v . Pain radiates from RLQ to RUQ

## 2017-03-25 NOTE — Progress Notes (Addendum)
Patient is requesting nebulizer treatment. Patient does not have orders for inhalers or nebulizers. Patients lung sounds have inspiratory wheezes. Patients respiratory status is non labored and symmetrical. Patient is not in any respiratory distress at this time.  Patient requesting sleep medication, trazodone and sonata, to help him sleep. Nurse paged on-call [facility] provider for respiratory medications to be ordered.   Dr. Marcello Moores returned call to nurse. Dr. Marcello Moores aware of respiratory medications and sleep medication request. Per Dr. Marcello Moores, order both respiratory inhalers, albuterol and advair, and order trazodone 150mg  as needed for sleep. Nurse will enter orders.   Nurse ordered albuterol inhaler. Advair inhaler can not be ordered at this time due to patients allergy to fluocinolone.

## 2017-03-25 NOTE — Anesthesia Procedure Notes (Signed)
Procedure Name: Intubation Date/Time: 03/25/2017 2:39 PM Performed by: Claudia Desanctis Pre-anesthesia Checklist: Patient identified, Emergency Drugs available, Suction available and Patient being monitored Patient Re-evaluated:Patient Re-evaluated prior to inductionOxygen Delivery Method: Circle system utilized Preoxygenation: Pre-oxygenation with 100% oxygen Intubation Type: IV induction, Cricoid Pressure applied and Rapid sequence Ventilation: Mask ventilation without difficulty Laryngoscope Size: 2 and Miller Grade View: Grade I Tube type: Oral Tube size: 7.5 mm Number of attempts: 1 Airway Equipment and Method: Stylet Placement Confirmation: ETT inserted through vocal cords under direct vision,  positive ETCO2 and breath sounds checked- equal and bilateral Tube secured with: Tape Dental Injury: Teeth and Oropharynx as per pre-operative assessment

## 2017-03-25 NOTE — Anesthesia Preprocedure Evaluation (Signed)
Anesthesia Evaluation  Patient identified by MRN, date of birth, ID band Patient awake    Reviewed: Allergy & Precautions, H&P , Patient's Chart, lab work & pertinent test results, reviewed documented beta blocker date and time   Airway Mallampati: II  TM Distance: >3 FB Neck ROM: full    Dental no notable dental hx.    Pulmonary former smoker,    Pulmonary exam normal breath sounds clear to auscultation       Cardiovascular hypertension,  Rhythm:regular Rate:Normal     Neuro/Psych    GI/Hepatic   Endo/Other    Renal/GU      Musculoskeletal   Abdominal   Peds  Hematology   Anesthesia Other Findings Hodgkin disease ([place])   Thyroid cancer ([place])   Barrett's esophagus   GI bleed   Pulmonary fibrosis    Hypertension   Drug abuse    Asthma   Stroke  Hodgkin's disease Hepatitis C  Bipolar 1 disorder    Reproductive/Obstetrics                             Anesthesia Physical Anesthesia Plan  ASA: II  Anesthesia Plan: General   Post-op Pain Management:    Induction: Intravenous and Rapid sequence  PONV Risk Score and Plan: 1 and Ondansetron and Dexamethasone  Airway Management Planned: Oral ETT  Additional Equipment:   Intra-op Plan:   Post-operative Plan: Extubation in OR  Informed Consent: I have reviewed the patients History and Physical, chart, labs and discussed the procedure including the risks, benefits and alternatives for the proposed anesthesia with the patient or authorized representative who has indicated his/her understanding and acceptance.   Dental Advisory Given  Plan Discussed with: CRNA and Surgeon  Anesthesia Plan Comments: (  )        Anesthesia Quick Evaluation

## 2017-03-25 NOTE — ED Provider Notes (Signed)
[place] DEPT [facility] Provider Note   CSN: [id number] Arrival date & time: 03/25/17  1601     History   Chief Complaint No chief complaint on file.   HPI Troy [name] is a 41 y.o. male.   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 Patient noted that it felt like a "pull sensation". Then he started vomiting overnight with increasing pain in his right lower quadrant.  Past Medical History:  Diagnosis Date  . Asthma   . Barrett's esophagus   . Bipolar 1 disorder ([place])   . Drug abuse   . GI bleed   . Hepatitis    Hep C,   . Hodgkin disease ([place])   . Hodgkin's disease ([place])   . Hypertension   . Pulmonary fibrosis ([place])   . Stroke ([place])   . Thyroid cancer [facility])     Patient Active Problem List   Diagnosis Date Noted  . Stroke ([place]) 07/17/2015  . Substance induced mood disorder ([place])   . Cerebrovascular accident (CVA) [facility])     Past Surgical History:  Procedure Laterality Date  . PARATHYROIDECTOMY    . spleenectomy    . TEE WITHOUT CARDIOVERSION N/A 07/18/2015   Procedure: TRANSESOPHAGEAL ECHOCARDIOGRAM (TEE);  Surgeon: Thayer Headings, MD;  Location: [facility];  Service: Cardiovascular;  Laterality: N/A;  . THYROIDECTOMY         Home Medications    Prior to Admission medications   Medication Sig Start Date End Date Taking? Authorizing Provider  acetaZOLAMIDE (DIAMOX) 500 MG capsule Take 500 mg by mouth 2 (two) times daily.   Yes [provider]  albuterol (PROVENTIL HFA;VENTOLIN HFA) 108 (90 BASE) MCG/ACT inhaler Inhale 2 puffs into the lungs every 6 (six) hours as needed. For shortness of breath.   Yes [provider]  albuterol (PROVENTIL) (5 MG/ML) 0.5% nebulizer solution    Yes [provider]  aspirin 325 MG tablet Take 1 tablet (325 mg  total) by mouth daily. 07/18/15  Yes Ghimire, Henreitta Leber, MD  buPROPion (WELLBUTRIN XL) 300 MG 24 hr tablet Take 300 mg by mouth daily.   Yes [provider]  Fluticasone-Salmeterol (ADVAIR) 250-50 MCG/DOSE AEPB Inhale 1 puff into the lungs every 12 (twelve) hours.   Yes [provider]  levothyroxine (SYNTHROID, LEVOTHROID) 112 MCG tablet Take 250 mcg by mouth daily.    Yes [provider]  lisinopril (PRINIVIL,ZESTRIL) 20 MG tablet Take 20 mg by mouth.    Yes [provider]  ondansetron (ZOFRAN) 4 MG tablet Take 4 mg by mouth every 8 (eight) hours as needed for nausea or vomiting.   Yes [provider]  QUEtiapine (SEROQUEL) 400 MG tablet Take 800 mg by mouth at bedtime.   Yes [provider]  rosuvastatin (CRESTOR) 40 MG tablet  11/11/15  Yes [provider]  sertraline (ZOLOFT) 100 MG tablet Take 100 mg by mouth daily.   Yes [provider]  traZODone (DESYREL) 150 MG tablet Take by mouth at bedtime.   Yes [provider]  amLODipine (NORVASC) 10 MG tablet  11/11/15   [provider]  clindamycin (CLEOCIN) 300 MG capsule Take 1 capsule (300 mg total) by mouth 4 (four) times daily. X 7 days 08/01/16   Palumbo, April, MD  etodolac (LODINE) 500 MG tablet Take 1  tablet (500 mg total) by mouth 2 (two) times daily. 03/27/16   Dorie Rank, MD  fluticasone Asencion Islam) 50 MCG/ACT nasal spray Place into both nostrils daily.    [provider]  montelukast (SINGULAIR) 10 MG tablet  11/11/15   [provider]  Omeprazole-Sodium Bicarbonate (ZEGERID) 20-1100 MG CAPS Take 1 capsule by mouth daily before breakfast.    [provider]    Family History Family History  Problem Relation Age of Onset  . Stroke Maternal Grandmother     Social History Social History  Substance Use Topics  . Smoking status: Former Smoker    Types: Cigarettes  . Smokeless tobacco: Never Used  . Alcohol use No      Comment: occasionally     Allergies   Ciprofloxacin; Thelma Barge officinalis]; and Fluocinolone   Review of Systems Review of Systems  Constitutional: Negative for activity change, fatigue and fever.  HENT: Negative for congestion.   Respiratory: Negative for shortness of breath.   Cardiovascular: Negative for chest pain.  Gastrointestinal: Positive for abdominal pain and vomiting.     Physical Exam Updated Vital Signs BP 124/80 (BP Location: Left Arm)   Pulse (!) 106   Temp 98.2 F (36.8 C) (Oral)   Resp 18   Ht 6' (1.829 m)   Wt 102.1 kg (225 lb)   SpO2 98%   BMI 30.52 kg/m   Physical Exam  Constitutional: He is oriented to person, place, and time. He appears well-nourished.  HENT:  Head: Normocephalic.  Mouth/Throat: No oropharyngeal exudate.  Eyes: Conjunctivae are normal.  Cardiovascular: Normal rate.   Pulmonary/Chest: Effort normal.  Abdominal: There is tenderness. There is guarding.  Musculoskeletal: He exhibits no edema.  Neurological: He is oriented to person, place, and time.  Skin: Skin is warm and dry. He is not diaphoretic.  Psychiatric: He has a normal mood and affect. His behavior is normal.     ED Treatments / Results  Labs (all labs ordered are listed, but only abnormal results are displayed) Labs Reviewed  CBC WITH DIFFERENTIAL/PLATELET - Abnormal; Notable for the following:       Result Value   WBC 15.6 (*)    RBC 4.13 (*)    HCT 38.4 (*)    Neutro Abs 11.6 (*)    Monocytes Absolute 1.4 (*)    Eosinophils Absolute 0.9 (*)    All other components within normal limits  URINALYSIS, ROUTINE W REFLEX MICROSCOPIC  COMPREHENSIVE METABOLIC PANEL  LIPASE, BLOOD    EKG  EKG Interpretation None       Radiology No results found.  Procedures Procedures (including critical care time)  Medications Ordered in ED Medications  sodium chloride 0.9 % bolus 1,000 mL (not administered)  fentaNYL (SUBLIMAZE) injection 50 mcg (not  administered)  ondansetron (ZOFRAN) injection 4 mg (not administered)     Initial Impression / Assessment and Plan / ED Course  I have reviewed the triage vital signs and the nursing notes.  Pertinent labs & imaging results that were available during my care of the patient were reviewed by me and considered in my medical decision making (see chart for details).     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  Patient has tenderness on exam. Patient has vomiting. We'll do CT abdomen pelvis.   11:50 AM CT shows appendicitis.  Discussed with Dr. Hassell Done surgeryat [facility]. Patient will go directly to admitting short stay for surgery scheduled at 3 PM. Discussion had about taking a rescue versus private vehicle. Both the surgeon and patient prefer private vehicle. Risks explained and understood.  Final Clinical Impressions(s) / ED Diagnoses   Final diagnoses:  None    New Prescriptions New Prescriptions   No medications on file     Macarthur Critchley, MD 03/25/17 1151

## 2017-03-25 NOTE — ED Notes (Signed)
Pt refused wheelchair, ambulatory without assistance.

## 2017-03-25 NOTE — Interval H&P Note (Signed)
History and Physical Interval Note:  03/25/2017 2:04 PM  Troy [name]  has presented today for surgery, with the diagnosis of appendix  The various methods of treatment have been discussed with the patient and family. After consideration of risks, benefits and other options for treatment, the patient has consented to  Procedure(s): APPENDECTOMY LAPAROSCOPIC (N/A) as a surgical intervention .  The patient's history has been reviewed, patient examined, no change in status, stable for surgery.  I have reviewed the patient's chart and labs.  Questions were answered to the patient's satisfaction.     [name]

## 2017-03-25 NOTE — Op Note (Signed)
Surgeon: Kaylyn Lim, MD, FACS  Asst:  none  Anes:  general  Preop Dx: Acute appendicitis in patient with prior laparotomy, splenectomy and mantle irradiation for Hodgkin's Postop Dx: same  Procedure: Extensive enterolysis with laparoscopic appendectomy  Location Surgery: [facility] 4 Complications: None noted  EBL:   15 cc  Drains: none  Description of Procedure:  The patient was taken to OR 4 .  After anesthesia was administered and the patient was prepped a timeout was performed.  The patient had undergone a full laparotomy with splenectomy in the 90s for Hodgkin's disease with postop radiation therapy. The BX incision from his prior surgery extended well below his umbilicus. I elected to enter the abdomen through the right upper quadrant using 5 mm Optiview and a did this with difficulty owing to the marked amount of adhesions well visit omentum to his anterior abdominal wall. I did enter into a small pocket in through that I could look down along the right lower quadrant gutter. I was able to place a 5 mm trocar up proximally over the appendix and then worked backwards to take down a lot of these adhesions using the Harmonic Scalpel. I didn't encounter bleeding at one point which are controlled with the harmonic scalpel. I was able to finally place another 5 mm trocar in the left lower quadrant. I shifted his position and then went searching for the appendix. Appendix was very long and was stuck down into the pelvis. I was able to retrieve it and I placed an Endoloop over the tip which I used for retraction. Retracting it and then divided the mesentery carefully watching the hot blade and preventing bowel injury. I transected the patient takes mesentery up about halfway and then went to the base the appendix and dissected the free space. I opened that up and then inserted an Endo GIA with the 4.5 mm cartridge containing a vascular load. I clamped this and maintained 30 seconds of compression before  firing. Had a good staple line closure on the appendix and stump. I then easily went to the remaining mesentery. The appendix was then placed into the cul-de-sac and brought up into the wound in the right upper quadrant. I had a morcellated somewhat to get it out but I did get it out without cutting the 12 mm port. I then went back and inspected the stump in the pit periappendiceal region and there was no active bleeding or evidence of any other injuries. Irrigated well. I injected the incisions with Exparel  20 mL.  Incisions were closed with 4-0 Monocryl and with Dermabond. Patient tolerated the procedure well Stegner covering in satisfactory condition.  The patient tolerated the procedure well and was taken to the PACU in stable condition.     Matt B. Hassell Done, [place], [facility], [place]

## 2017-03-25 NOTE — Progress Notes (Signed)
Patient is requesting 300mg  of trazodone. Patient explains he takes Trazodone 300mg  and sonata 10mg  nightly for sleep. Patient pulled up his [facility] my chart accoutn to show nurse medication list. Trazodone 300mg  and sonata 10mg  are listed under patients medications and were filled in June. Nurse paged Dr. Marcello Moores. Per Dr. Marcello Moores, patients trazodone can not be increased due to being on pain medications. Dr. Marcello Moores gave verbal order to add Benadryl 25mg  by mouth, as needed for sleep. Nurse will enter verbal order.

## 2017-03-26 LAB — CBC WITH DIFFERENTIAL/PLATELET
BASOS ABS: 0 10*3/uL (ref 0.0–0.1)
Basophils Relative: 0 %
EOS PCT: 0 %
Eosinophils Absolute: 0 10*3/uL (ref 0.0–0.7)
HEMATOCRIT: 36.9 % — AB (ref 39.0–52.0)
Hemoglobin: 12.8 g/dL — ABNORMAL LOW (ref 13.0–17.0)
LYMPHS ABS: 2.5 10*3/uL (ref 0.7–4.0)
Lymphocytes Relative: 12 %
MCH: 32.5 pg (ref 26.0–34.0)
MCHC: 34.7 g/dL (ref 30.0–36.0)
MCV: 93.7 fL (ref 78.0–100.0)
MONO ABS: 2 10*3/uL — AB (ref 0.1–1.0)
Monocytes Relative: 10 %
NEUTROS ABS: 15.7 10*3/uL — AB (ref 1.7–7.7)
Neutrophils Relative %: 78 %
PLATELETS: 267 10*3/uL (ref 150–400)
RBC: 3.94 MIL/uL — AB (ref 4.22–5.81)
RDW: 14 % (ref 11.5–15.5)
WBC: 20.1 10*3/uL — ABNORMAL HIGH (ref 4.0–10.5)

## 2017-03-26 LAB — BASIC METABOLIC PANEL
ANION GAP: 12 (ref 5–15)
BUN: 11 mg/dL (ref 6–20)
CALCIUM: 9.2 mg/dL (ref 8.9–10.3)
CHLORIDE: 102 mmol/L (ref 101–111)
CO2: 23 mmol/L (ref 22–32)
Creatinine, Ser: 1.2 mg/dL (ref 0.61–1.24)
GFR calc non Af Amer: >60 mL/min (ref >60)
GLUCOSE: 135 mg/dL — AB (ref 65–99)
Potassium: 4.3 mmol/L (ref 3.5–5.1)
Sodium: 137 mmol/L (ref 135–145)

## 2017-03-26 LAB — TROPONIN I: Troponin I: <0.03 ng/mL (ref <0.03)

## 2017-03-26 MED ORDER — DIPHENHYDRAMINE HCL 12.5 MG/5ML PO ELIX: 12.5000 mg | ORAL_SOLUTION | Freq: Four times a day (QID) | ORAL | Status: DC | PRN

## 2017-03-26 MED ORDER — QUETIAPINE FUMARATE 100 MG PO TABS
800.0000 mg | ORAL_TABLET | Freq: Every day | ORAL | Status: DC
  Administered 2017-03-26 – 2017-03-27 (×2): 800 mg via ORAL
  Filled 2017-03-26 (×2): qty 8

## 2017-03-26 MED ORDER — HYDROMORPHONE 1 MG/ML IV SOLN
INTRAVENOUS | Status: DC
  Administered 2017-03-26: 25 mg via INTRAVENOUS
  Administered 2017-03-26: 3.3 mg via INTRAVENOUS
  Administered 2017-03-26: 4.73 mg via INTRAVENOUS
  Administered 2017-03-27: 4.5 mg via INTRAVENOUS
  Administered 2017-03-27: 0.6 mg via INTRAVENOUS
  Administered 2017-03-27: 3.3 mg via INTRAVENOUS
  Administered 2017-03-27: 4.2 mg via INTRAVENOUS
  Administered 2017-03-27 (×2): 4.5 mg via INTRAVENOUS
  Administered 2017-03-27: 2.1 mg via INTRAVENOUS
  Administered 2017-03-28: 3.6 mg via INTRAVENOUS
  Administered 2017-03-28: 3.9 mg via INTRAVENOUS
  Filled 2017-03-26 (×2): qty 25

## 2017-03-26 MED ORDER — MORPHINE SULFATE (PF) 4 MG/ML IV SOLN
1.0000 mg | INTRAVENOUS | Status: DC | PRN
  Administered 2017-03-26 (×2): 1 mg via INTRAVENOUS
  Filled 2017-03-26 (×2): qty 1

## 2017-03-26 MED ORDER — PANTOPRAZOLE SODIUM 40 MG PO TBEC
40.0000 mg | DELAYED_RELEASE_TABLET | Freq: Every day | ORAL | Status: DC
  Administered 2017-03-26 – 2017-03-27 (×2): 40 mg via ORAL
  Filled 2017-03-26 (×2): qty 1

## 2017-03-26 MED ORDER — NALOXONE HCL 0.4 MG/ML IJ SOLN: 0.4000 mg | INTRAMUSCULAR | Status: DC | PRN

## 2017-03-26 MED ORDER — HYDROMORPHONE 1 MG/ML IV SOLN: INTRAVENOUS | Status: DC

## 2017-03-26 MED ORDER — SERTRALINE HCL 100 MG PO TABS
100.0000 mg | ORAL_TABLET | Freq: Every day | ORAL | Status: DC
  Administered 2017-03-26 – 2017-03-28 (×3): 100 mg via ORAL
  Filled 2017-03-26 (×3): qty 1

## 2017-03-26 MED ORDER — DIPHENHYDRAMINE HCL 50 MG/ML IJ SOLN: 12.5000 mg | Freq: Four times a day (QID) | INTRAMUSCULAR | Status: DC | PRN

## 2017-03-26 MED ORDER — HYDROMORPHONE HCL 1 MG/ML IJ SOLN
1.0000 mg | Freq: Once | INTRAMUSCULAR | Status: AC
  Administered 2017-03-26: 1 mg via INTRAVENOUS
  Filled 2017-03-26: qty 1

## 2017-03-26 MED ORDER — TRAZODONE HCL 100 MG PO TABS
300.0000 mg | ORAL_TABLET | Freq: Every evening | ORAL | Status: DC | PRN
  Administered 2017-03-26 – 2017-03-27 (×2): 300 mg via ORAL
  Filled 2017-03-26 (×2): qty 3

## 2017-03-26 MED ORDER — HYDROCODONE-ACETAMINOPHEN 5-325 MG PO TABS
1.0000 | ORAL_TABLET | ORAL | Status: DC | PRN
  Administered 2017-03-26 – 2017-03-28 (×5): 2 via ORAL
  Filled 2017-03-26 (×5): qty 2

## 2017-03-26 MED ORDER — LEVOTHYROXINE SODIUM 50 MCG PO TABS
250.0000 ug | ORAL_TABLET | Freq: Every day | ORAL | Status: DC
  Administered 2017-03-27 – 2017-03-28 (×2): 250 ug via ORAL
  Filled 2017-03-26 (×2): qty 2

## 2017-03-26 MED ORDER — SODIUM CHLORIDE 0.9% FLUSH: 9.0000 mL | INTRAVENOUS | Status: DC | PRN

## 2017-03-26 MED ORDER — ONDANSETRON HCL 4 MG/2ML IJ SOLN: 4.0000 mg | Freq: Four times a day (QID) | INTRAMUSCULAR | Status: DC | PRN

## 2017-03-26 MED ORDER — PIPERACILLIN-TAZOBACTAM 3.375 G IVPB
3.3750 g | Freq: Three times a day (TID) | INTRAVENOUS | Status: DC
  Administered 2017-03-26 – 2017-03-28 (×6): 3.375 g via INTRAVENOUS
  Filled 2017-03-26 (×7): qty 50

## 2017-03-26 MED ORDER — ALUM & MAG HYDROXIDE-SIMETH 200-200-20 MG/5ML PO SUSP
30.0000 mL | ORAL | Status: DC | PRN
  Administered 2017-03-26 – 2017-03-28 (×3): 30 mL via ORAL
  Filled 2017-03-26 (×3): qty 30

## 2017-03-26 MED ORDER — DIPHENHYDRAMINE HCL 12.5 MG/5ML PO ELIX
12.5000 mg | ORAL_SOLUTION | Freq: Four times a day (QID) | ORAL | Status: DC | PRN
Start: 2017-03-26 — End: 2017-03-26

## 2017-03-26 MED ORDER — MORPHINE SULFATE (PF) 2 MG/ML IV SOLN: 1.0000 mg | INTRAVENOUS | Status: DC | PRN

## 2017-03-26 MED ORDER — LISINOPRIL 20 MG PO TABS
20.0000 mg | ORAL_TABLET | Freq: Every day | ORAL | Status: DC
  Administered 2017-03-26 – 2017-03-28 (×2): 20 mg via ORAL
  Filled 2017-03-26 (×3): qty 1

## 2017-03-26 MED ORDER — BUPROPION HCL ER (XL) 300 MG PO TB24
300.0000 mg | ORAL_TABLET | Freq: Every day | ORAL | Status: DC
  Administered 2017-03-26 – 2017-03-28 (×3): 300 mg via ORAL
  Filled 2017-03-26 (×3): qty 1

## 2017-03-26 NOTE — Progress Notes (Signed)
Nurse tech obtained EKG at 0259 and 0300 due to patient unable to lay without movement. EKG at 0300 did not upload into system. Nurse requested Nurse Tech to repeat EKG. Nurse paged Dr. Marcello Moores upon completion of EKGs. Multiple were obtained due to patient unable to remain still. Dr. Marcello Moores is aware of EKG results and requests nurse to make no changes at this time.

## 2017-03-26 NOTE — Progress Notes (Signed)
Pt no longer takes diamox or norvasc. Yamileth Hayse, [employer]

## 2017-03-26 NOTE — Progress Notes (Signed)
The patient is receiving Protonix by the intravenous route.  Based on criteria approved by the Pharmacy and [place], the medication is being converted to the equivalent oral dose form.  These criteria include: -No active GI bleeding -Able to tolerate diet of full liquids (or better) or tube feeding -Able to tolerate other medications by the oral or enteral route  If you have any questions about this conversion, please contact the Pharmacy Department (phone [phone]).  Thank you. Eudelia Bunch, Pharm.D. [phone] 03/26/2017 12:11 PM

## 2017-03-26 NOTE — Progress Notes (Signed)
Nurse called rapid response nurse due to patient having pain and reporting having trouble breathing. Rapid response nurse, Darrick Meigs, requested nurse to page Dr. Marcello Moores to return call to [place]. Nurse sent page.

## 2017-03-26 NOTE — Progress Notes (Addendum)
Patient complaining of right upper abdomen pain radiating to shoulder at approximately 0140. Nurse and patient walked down hall to see if a walk would help. Pain did ease, did not go away completely. Pain eased enough for patient to sleep for approximately 1 hour.   Patient currently complaining of pain in right upper abdomen radiating to shoulder, described as stabbing. Nurse walked with patient down hall. Pain did not improve. Patients pulse is elevated currently at 104. Blood pressure 143/77. Nurse paged Dr. Marcello Moores. Per Dr. Marcello Moores, obtain EKG and use ice packs or heat packs to help alleviate pain.

## 2017-03-26 NOTE — Progress Notes (Signed)
Patient has bruises around 2 lap sites on right side of abdomen. Abdomen is taut to palpation on right side. Daytime nurse is aware of patients status.

## 2017-03-26 NOTE — Progress Notes (Signed)
Dr Lucia Gaskins contacted & will be coming to see pt. Becca Bayne, [employer]

## 2017-03-26 NOTE — Significant Event (Addendum)
Rapid Response Event Note  Overview: Time Called: 3704 Arrival Time: 0545 Event Type: Other (Comment) (Severe Pain ) Called by bedside RN in regards to severe referred right shoulder pain from right abdomen. Bedside RN said that she had been giving him pain medication throughout the shift.  Initial Focused Assessment: Upon arrival, patient crying in pain sitting in a chair. Patient was complaining that his pain was coming from his abdomen was shooting toward his right shoulder. Patient was guarding his right shoulder and abdomen often.  Neuro: Patient was alert and oriented x4. Patient followed commands.  Cardiac: Patient in Sinus Tachycardia, S1 and S2 sounds heard upon auscultation. Pulses 2 + and bounding. Bedside RN obtained EKG, revealed Sinus Tachycardia.  Pulmonary: Lungs clear and diminished in all lung fields bilaterally,17-18 RR/min Abdomen: Laparotomy sites from appendectomy-total of 3. All 3 sites were bruised-more so on the right side then left side. The right abdominal quadrant was the source of pain-tender to touch, swollen, and taught in comparison to the left.  Interventions: Paged MD Marcello Moores with [facility] and told her findings seen above. Ordered Bedside RN to give PRN morphine 1 mg ordered Placed ice pack against patient's gown Plan of Care (if not transferred): MD Marcello Moores said "It is just surgical pain that patient has, patient has history of IV drug use." "Patient will have to try to control pain." Asked MD Marcello Moores if she would like to monitor more closely in ICU/SD. She said "not at this time." Asked MD Marcello Moores if she would like to order a abdominal x-ray and also she said "not at this time". MD Marcello Moores ordered to place one time troponin, CBC, and BMET. Continue to monitor patient on [place] and give 1mg  of dilaudid IV push.  Patient not in any respiratory distress at this time, patient hemodynamically stable see vital signs in chart.  Event Summary:   at  [place] called MD Graylon Gunning C

## 2017-03-26 NOTE — Progress Notes (Signed)
PCA started. [name], [employer]

## 2017-03-26 NOTE — Progress Notes (Signed)
[place] [facility] Office:  [phone] General Surgery Progress Note   LOS: 0 days  POD -  1 Day Post-Op  Chief Complaint: Abdominal pain  Assessment and Plan: 1.  APPENDECTOMY LAPAROSCOPIC; EXTENSIVE LYSIS OF ADHESIONS FROM PRIOR RADIATION AND SURGERY - 03/25/2017 - M. Martin  WBC - 20,100 - 03/26/2017  Zosyn - 6/15 >>>  Hard to tell what is going on, in part because of his exaggerated response But clinically he looks stable and appears to be doing okay.  He has a lot of baseline problems - will change pain meds around and advance diet.   2.  History of Hodgkin's lymphoma - 1990's 3.  Bipolar  Was followed by Donnal Moat - but transferring care to [facility] 4.  Hepatitis C 5.  HTN 6.  History of substance abuse 7.  DVT prophylaxis - SQ Heparin 8.  Seeking disability - last worked in 2016 9.  History of stroke - but has had full recovery   Active Problems:   Appendicitis  Subjective:  Complains of RUQ pain and pain at trocar site.  Some nausea last PM, better this AM.    Objective:   Vitals:   03/26/17 0240 03/26/17 0701  BP: (!) 143/77 (!) 154/85  Pulse: (!) 104 (!) 108  Resp:  (!) 21  Temp:  98.2 F (36.8 C)     Intake/Output from previous day:  06/15 0701 - 06/16 0700 In: 2930 [P.O.:480; I.V.:1450; IV Piggyback:1000] Out: 1175 [Urine:1150; Blood:25]  Intake/Output this shift:  Total I/O In: 240 [P.O.:240] Out: -    Physical Exam:   General: Moaning WM who is alert and oriented.    HEENT: Normal. Pupils equal. .   Lungs: Clear - pulls 2,000 cc   Abdomen: Soft, rare BS   Wound: Clean   Lab Results:    Recent Labs  03/25/17 0909 03/26/17 0629  WBC 15.6* 20.1*  HGB 13.4 12.8*  HCT 38.4* 36.9*  PLT 277 267    BMET   Recent Labs  03/25/17 0909 03/26/17 0629  NA 136 137  K 4.1 4.3  CL 103 102  CO2 24 23  GLUCOSE 113* 135*  BUN 12 11  CREATININE 1.09 1.20  CALCIUM 8.8* 9.2    PT/INR  No results for input(s): LABPROT, INR in the last  72 hours.  ABG  No results for input(s): PHART, HCO3 in the last 72 hours.  Invalid input(s): PCO2, PO2   Studies/Results:  Ct Abdomen Pelvis W Contrast  Result Date: 03/25/2017 CLINICAL DATA:  Right-sided abdominal pain since yesterday. EXAM: CT ABDOMEN AND PELVIS WITH CONTRAST TECHNIQUE: Multidetector CT imaging of the abdomen and pelvis was performed using the standard protocol following bolus administration of intravenous contrast. CONTRAST:  149mL ISOVUE-300 IOPAMIDOL (ISOVUE-300) INJECTION 61% COMPARISON:  11/02/2015 FINDINGS: Lower chest:  Unremarkable. Hepatobiliary: No focal abnormality within the liver parenchyma. There is no evidence for gallstones, gallbladder wall thickening, or pericholecystic fluid. No intrahepatic or extrahepatic biliary dilation. Pancreas: No focal mass lesion. No dilatation of the main duct. No intraparenchymal cyst. No peripancreatic edema. Spleen: Spleen is surgically absent. Adrenals/Urinary Tract: No adrenal nodule or mass. Kidneys are unremarkable. Tiny subcapsular cyst noted posterior interpolar right kidney. No evidence for hydroureter. The urinary bladder appears normal for the degree of distention. Stomach/Bowel: Tiny hiatal hernia. Stomach otherwise unremarkable. Duodenum is normally positioned as is the ligament of Treitz. No small bowel wall thickening. No small bowel dilatation. The terminal ileum is normal. The appendix is dilated  up to 14 mm diameter. Appendiceal anatomy is elongated. There is associated periappendiceal edema/inflammation. No extraluminal gas or abscess in the right lower quadrant. No gross colonic mass. No colonic wall thickening. No substantial diverticular change. Vascular/Lymphatic: There is abdominal aortic atherosclerosis without aneurysm. Mild lymphadenopathy in the ileocolic ligament is stable. No retroperitoneal lymphadenopathy. No pelvic sidewall lymphadenopathy. Reproductive: The prostate gland and seminal vesicles have normal  imaging features. Other: A trace amount of free fluid is seen in the pelvis. Musculoskeletal: Bone windows reveal no worrisome lytic or sclerotic osseous lesions. IMPRESSION: 1. Acute appendicitis without evidence for perforation. No CT findings to suggest abscess at this time. There is a trace amount of fluid in the anatomic pelvis an adjacent to the inflamed appendix. I personally called these results directly to Dr.Mackuen at 1053 hours on 03/25/2017. Electronically Signed   By: Misty Stanley M.D.   On: 03/25/2017 10:53     Anti-infectives:   Anti-infectives    Start     Dose/Rate Route Frequency Ordered Stop   03/25/17 2200  piperacillin-tazobactam (ZOSYN) IVPB 3.375 g     3.375 g 12.5 mL/hr over 240 Minutes Intravenous Every 8 hours 03/25/17 1807 03/26/17 0242   03/25/17 1415  piperacillin-tazobactam (ZOSYN) IVPB 3.375 g     3.375 g 100 mL/hr over 30 Minutes Intravenous  Once 03/25/17 1407 03/25/17 1438   03/25/17 1404  piperacillin-tazobactam (ZOSYN) 3.375 GM/50ML IVPB    Comments:  Bridget Hartshorn   : cabinet override      03/25/17 1404 03/25/17 1428      Alphonsa Overall, MD, FACS Pager: [facility] Office: [phone] 03/26/2017

## 2017-03-27 LAB — BASIC METABOLIC PANEL
Anion gap: 12 (ref 5–15)
BUN: 12 mg/dL (ref 6–20)
CHLORIDE: 104 mmol/L (ref 101–111)
CO2: 23 mmol/L (ref 22–32)
Calcium: 8.4 mg/dL — ABNORMAL LOW (ref 8.9–10.3)
Creatinine, Ser: 1.23 mg/dL (ref 0.61–1.24)
GFR calc non Af Amer: >60 mL/min (ref >60)
Glucose, Bld: 101 mg/dL — ABNORMAL HIGH (ref 65–99)
POTASSIUM: 3.6 mmol/L (ref 3.5–5.1)
SODIUM: 139 mmol/L (ref 135–145)

## 2017-03-27 LAB — CBC WITH DIFFERENTIAL/PLATELET
BASOS PCT: 0 %
Basophils Absolute: 0 10*3/uL (ref 0.0–0.1)
EOS ABS: 0.2 10*3/uL (ref 0.0–0.7)
Eosinophils Relative: 3 %
HEMATOCRIT: 32.7 % — AB (ref 39.0–52.0)
HEMOGLOBIN: 10.8 g/dL — AB (ref 13.0–17.0)
LYMPHS ABS: 2 10*3/uL (ref 0.7–4.0)
Lymphocytes Relative: 21 %
MCH: 31.4 pg (ref 26.0–34.0)
MCHC: 33 g/dL (ref 30.0–36.0)
MCV: 95.1 fL (ref 78.0–100.0)
MONOS PCT: 17 %
Monocytes Absolute: 1.5 10*3/uL — ABNORMAL HIGH (ref 0.1–1.0)
NEUTROS ABS: 5.4 10*3/uL (ref 1.7–7.7)
NEUTROS PCT: 59 %
Platelets: 172 10*3/uL (ref 150–400)
RBC: 3.44 MIL/uL — AB (ref 4.22–5.81)
RDW: 14.4 % (ref 11.5–15.5)
WBC: 9.1 10*3/uL (ref 4.0–10.5)

## 2017-03-27 MED ORDER — ALBUTEROL SULFATE (2.5 MG/3ML) 0.083% IN NEBU
2.5000 mg | INHALATION_SOLUTION | Freq: Three times a day (TID) | RESPIRATORY_TRACT | Status: DC
  Administered 2017-03-28 (×2): 2.5 mg via RESPIRATORY_TRACT
  Filled 2017-03-27 (×2): qty 3

## 2017-03-27 NOTE — Progress Notes (Signed)
[facility] Office:  [phone] General Surgery Progress Note   LOS: 1 day  POD -  2 Days Post-Op  Chief Complaint: Abdominal pain  Assessment and Plan: 1.  APPENDECTOMY LAPAROSCOPIC; EXTENSIVE LYSIS OF ADHESIONS FROM PRIOR RADIATION AND SURGERY - 03/25/2017 - M. Martin  WBC - 9,000 - 03/27/2017  Zosyn - 6/15 >>>  clinically he looks stable and appears to be doing okay.  He has a lot of baseline problems - Cont PCA and advance diet.   2.  History of Hodgkin's lymphoma - 1990's 3.  Bipolar  Was followed by Donnal Moat - but transferring care to [facility] 4.  Hepatitis C 5.  HTN 6.  History of substance abuse 7.  DVT prophylaxis - SQ Heparin 8.  Seeking disability - last worked in 2016 9.  History of stroke - but has had full recovery   Active Problems:   Appendicitis  Subjective:  Complains of less pain.  Overall feeling better.  No flatus yet    Objective:   Vitals:   03/27/17 0454 03/27/17 0853  BP: (!) 95/55   Pulse:    Resp:  12  Temp:       Intake/Output from previous day:  06/16 0701 - 06/17 0700 In: 1680 [P.O.:1680] Out: 1785 [Urine:1785]  Intake/Output this shift:  Total I/O In: 120 [P.O.:120] Out: 600 [Urine:600]   Physical Exam:   General: Moaning WM who is alert and oriented.    HEENT: Normal. Pupils equal. .   Lungs: Clear - pulls 2,000 cc   Abdomen: Soft, few BS   Wound: Clean, ecchymosis noted around trochar sites   Lab Results:     Recent Labs  03/26/17 0629 03/27/17 0459  WBC 20.1* 9.1  HGB 12.8* 10.8*  HCT 36.9* 32.7*  PLT 267 172    BMET    Recent Labs  03/26/17 0629 03/27/17 0459  NA 137 139  K 4.3 3.6  CL 102 104  CO2 23 23  GLUCOSE 135* 101*  BUN 11 12  CREATININE 1.20 1.23  CALCIUM 9.2 8.4*    PT/INR  No results for input(s): LABPROT, INR in the last 72 hours.  ABG  No results for input(s): PHART, HCO3 in the last 72 hours.  Invalid input(s): PCO2, PO2   Studies/Results:  Ct Abdomen Pelvis W  Contrast  Result Date: 03/25/2017 CLINICAL DATA:  Right-sided abdominal pain since yesterday. EXAM: CT ABDOMEN AND PELVIS WITH CONTRAST TECHNIQUE: Multidetector CT imaging of the abdomen and pelvis was performed using the standard protocol following bolus administration of intravenous contrast. CONTRAST:  192mL ISOVUE-300 IOPAMIDOL (ISOVUE-300) INJECTION 61% COMPARISON:  11/02/2015 FINDINGS: Lower chest:  Unremarkable. Hepatobiliary: No focal abnormality within the liver parenchyma. There is no evidence for gallstones, gallbladder wall thickening, or pericholecystic fluid. No intrahepatic or extrahepatic biliary dilation. Pancreas: No focal mass lesion. No dilatation of the main duct. No intraparenchymal cyst. No peripancreatic edema. Spleen: Spleen is surgically absent. Adrenals/Urinary Tract: No adrenal nodule or mass. Kidneys are unremarkable. Tiny subcapsular cyst noted posterior interpolar right kidney. No evidence for hydroureter. The urinary bladder appears normal for the degree of distention. Stomach/Bowel: Tiny hiatal hernia. Stomach otherwise unremarkable. Duodenum is normally positioned as is the ligament of Treitz. No small bowel wall thickening. No small bowel dilatation. The terminal ileum is normal. The appendix is dilated up to 14 mm diameter. Appendiceal anatomy is elongated. There is associated periappendiceal edema/inflammation. No extraluminal gas or abscess in the right lower quadrant. No gross colonic  mass. No colonic wall thickening. No substantial diverticular change. Vascular/Lymphatic: There is abdominal aortic atherosclerosis without aneurysm. Mild lymphadenopathy in the ileocolic ligament is stable. No retroperitoneal lymphadenopathy. No pelvic sidewall lymphadenopathy. Reproductive: The prostate gland and seminal vesicles have normal imaging features. Other: A trace amount of free fluid is seen in the pelvis. Musculoskeletal: Bone windows reveal no worrisome lytic or sclerotic osseous  lesions. IMPRESSION: 1. Acute appendicitis without evidence for perforation. No CT findings to suggest abscess at this time. There is a trace amount of fluid in the anatomic pelvis an adjacent to the inflamed appendix. I personally called these results directly to Dr.Mackuen at 1053 hours on 03/25/2017. Electronically Signed   By: Misty Stanley M.D.   On: 03/25/2017 10:53     Anti-infectives:   Anti-infectives    Start     Dose/Rate Route Frequency Ordered Stop   03/26/17 0900  piperacillin-tazobactam (ZOSYN) IVPB 3.375 g     3.375 g 12.5 mL/hr over 240 Minutes Intravenous Every 8 hours 03/26/17 0759     03/25/17 2200  piperacillin-tazobactam (ZOSYN) IVPB 3.375 g     3.375 g 12.5 mL/hr over 240 Minutes Intravenous Every 8 hours 03/25/17 1807 03/26/17 0242   03/25/17 1415  piperacillin-tazobactam (ZOSYN) IVPB 3.375 g     3.375 g 100 mL/hr over 30 Minutes Intravenous  Once 03/25/17 1407 03/25/17 1438   03/25/17 1404  piperacillin-tazobactam (ZOSYN) 3.375 GM/50ML IVPB    Comments:  Bridget Hartshorn   : cabinet override      03/25/17 1404 13/24/40 1027      Tatyanna Cronk C Jayln Branscom, MD  [facility]

## 2017-03-28 ENCOUNTER — Encounter (HOSPITAL_COMMUNITY): Payer: Self-pay | Admitting: Surgery

## 2017-03-28 MED ORDER — LIP MEDEX EX OINT
TOPICAL_OINTMENT | CUTANEOUS | Status: AC
  Administered 2017-03-28: 05:00:00
  Filled 2017-03-28: qty 7

## 2017-03-28 MED ORDER — HYDROCODONE-ACETAMINOPHEN 5-325 MG PO TABS: 1.0000 | ORAL_TABLET | ORAL | 0 refills | Status: DC | PRN

## 2017-03-28 NOTE — Discharge Instructions (Signed)
Laparoscopic Appendectomy, Adult, Care After °These instructions give you information about caring for yourself after your procedure. Your doctor may also give you more specific instructions. Call your doctor if you have any problems or questions after your procedure. °Follow these instructions at home: °Medicines °· Take over-the-counter and prescription medicines only as told by your doctor. °· Do not drive for 24 hours if you received a sedative. °· Do not drive or use heavy machinery while taking prescription pain medicine. °· If you were prescribed an antibiotic medicine, take it as told by your doctor. Do not stop taking it even if you start to feel better. °Activity °· Do not lift anything that is heavier than 10 pounds (4.5 kg) for 3 weeks or as told by your doctor. °· Do not play contact sports for 3 weeks or as told by your doctor. °· Slowly return to your normal activities. °Bathing °· Keep your cuts from surgery (incisions) clean and dry. °? Gently wash the cuts with soap and water. °? Rinse the cuts with water until the soap is gone. °? Pat the cuts dry with a clean towel. Do not rub the cuts. °· You may take showers after 48 hours. °· Do not take baths, swim, or use a hot tub for 2 weeks or as told by your doctor. °Cut Care °· Follow instructions from your doctor about how to take care of your cuts. Make sure you: °? Wash your hands with soap and water before you change your bandage (dressing). If you do not have soap and water, use hand sanitizer. °? Change your bandage as told by your doctor. °? Leave stitches (sutures), skin glue, or skin tape (adhesive) strips in place. They may need to stay in place for 2 weeks or longer. If tape strips get loose and curl up, you may trim the loose edges. Do not remove tape strips completely unless your doctor says it is okay. °· Check your cuts every day for signs of infection. Check for: °? More redness, swelling, or pain. °? More fluid or  blood. °? Warmth. °? Pus or a bad smell. °Other Instructions °· If you were sent home with a drain, follow instructions from your doctor about how to use it and care for it. °· Take deep breaths. This helps to keep your lungs from getting swollen (inflamed). °· To help with constipation: °? Drink plenty of fluids. °? Eat plenty of fruits and vegetables. °· Keep all follow-up visits as told by your doctor. This is important. °Contact a doctor if: °· You have more redness, swelling, or pain around a cut from surgery. °· You have more fluid or blood coming from a cut. °· Your cut feels warm to the touch. °· You have pus or a bad smell coming from a cut or a bandage. °· The edges of a cut break open after the stitches have been taken out. °· You have pain in your shoulders that gets worse. °· You feel dizzy or you pass out (faint). °· You have shortness of breath. °· You keep feeling sick to your stomach (nauseous). °· You keep throwing up (vomiting). °· You get diarrhea or you cannot control your poop. °· You lose your appetite. °· You have swelling or pain in your legs. °Get help right away if: °· You have a fever. °· You get a rash. °· You have trouble breathing. °· You have sharp pains in your chest. °This information is not intended to replace advice given   to you by your health care provider. Make sure you discuss any questions you have with your health care provider. °Document Released: 07/24/2009 Document Revised: 03/04/2016 Document Reviewed: 03/17/2015 °Elsevier Interactive Patient Education © 2018 [employer] Inc[employer] ° °

## 2017-03-28 NOTE — Discharge Summary (Signed)
[facility] Discharge Summary   Patient ID: Troy [name] MRN: [id number] DOB/AGE: 05/20/1976 41 y.o.  Admit date: 03/25/2017 Discharge date: 03/28/2017  Admitting Diagnosis: Acute appendicitis  Discharge Diagnosis Acute appendicitis  Consultants None  Procedures Dr. Daphine Deutscher (03/25/17) - Laparoscopic Appendectomy  Hospital Course:  Patient is a 41 y.o. male who was referred from [facility].  Workup there showed acute appendicitis.  Patient was admitted and underwent procedure listed above.  Tolerated procedure well and was transferred to the floor.  Diet was advanced as tolerated.  On POD#3, the patient was voiding well, tolerating diet, ambulating well, pain well controlled, vital signs stable, incisions c/d/i and felt stable for discharge home.  Patient will follow up in our office in 2 weeks and knows to call with questions or concerns.  He will call to confirm appointment date/time.    I have personally looked this patient up in the [place] Controlled Substance Database and reviewed their medications.  Allergies as of 03/28/2017      Reactions   Ciprofloxacin Other (See Comments)   Spontaneous rupture of tendon   Earnestine Leys Officinalis] Anaphylaxis, Hives   Fluocinolone Other (See Comments)   "spontaneous rupture of tendons" per pt dr.      Medication List    STOP taking these medications   clindamycin 300 MG capsule Commonly known as:  CLEOCIN     TAKE these medications   acetaZOLAMIDE 500 MG capsule Commonly known as:  DIAMOX Take 500 mg by mouth 2 (two) times daily.   albuterol (5 MG/ML) 0.5% nebulizer solution Commonly known as:  PROVENTIL   albuterol 108 (90 Base) MCG/ACT inhaler Commonly known as:  PROVENTIL HFA;VENTOLIN HFA Inhale 2 puffs into the lungs every 6 (six) hours as needed. For shortness of breath.   amLODipine 10 MG tablet Commonly known as:  NORVASC   ammonium lactate 12 % lotion Commonly known as:  LAC-HYDRIN Apply 1  application topically as needed for dry skin.   aspirin 325 MG tablet Take 1 tablet (325 mg total) by mouth daily.   buPROPion 300 MG 24 hr tablet Commonly known as:  WELLBUTRIN XL Take 300 mg by mouth daily.   etodolac 500 MG tablet Commonly known as:  LODINE Take 1 tablet (500 mg total) by mouth 2 (two) times daily.   fluticasone 50 MCG/ACT nasal spray Commonly known as:  FLONASE Place into both nostrils daily.   Fluticasone-Salmeterol 250-50 MCG/DOSE Aepb Commonly known as:  ADVAIR Inhale 1 puff into the lungs every 12 (twelve) hours.   HYDROcodone-acetaminophen 5-325 MG tablet Commonly known as:  NORCO/VICODIN Take 1-2 tablets by mouth every 4 (four) hours as needed for moderate pain.   levothyroxine 112 MCG tablet Commonly known as:  SYNTHROID, LEVOTHROID Take 250 mcg by mouth daily.   lisinopril 20 MG tablet Commonly known as:  PRINIVIL,ZESTRIL Take 20 mg by mouth.   montelukast 10 MG tablet Commonly known as:  SINGULAIR   ondansetron 4 MG tablet Commonly known as:  ZOFRAN Take 4 mg by mouth every 8 (eight) hours as needed for nausea or vomiting.   pantoprazole 40 MG tablet Commonly known as:  PROTONIX Take 40 mg by mouth daily.   QUEtiapine 400 MG tablet Commonly known as:  SEROQUEL Take 800 mg by mouth at bedtime.   rosuvastatin 40 MG tablet Commonly known as:  CRESTOR   sertraline 100 MG tablet Commonly known as:  ZOLOFT Take 100 mg by mouth daily.   traZODone  150 MG tablet Commonly known as:  DESYREL Take by mouth at bedtime.   zaleplon 10 MG capsule Commonly known as:  SONATA Take 10 mg by mouth at bedtime as needed for sleep (takes 10-20 mg).        Follow-up Information    [facility], [facility]. Call in 1 day(s).   Specialty:  General Surgery Why:  Call to confirm appointment time. Please plan to arrive 30 min prior to appointment time for check in. Bring photo ID and insurance information.  Contact information: [address] [place] [address] [phone]           Signed: Wells Guiles , [facility] 03/28/2017, 3:12 PM Pager: [phone] Consults: [phone] Mon-Fri 7:00 am-4:30 pm Sat-Sun 7:00 am-11:30 am  Agree with above. A peculiar personality, but clearly looks much better.  Ovidio Kin, MD, [facility] Surgery Pager: [phone] Office phone:  [phone]

## 2017-03-28 NOTE — Progress Notes (Signed)
[facility] Progress Note  3 Days Post-Op  Subjective: CC: abdominal pain Patient still with mild abdominal pain, but improving. Tolerating full liquids. Denies N/V. +flatus, no BM. UOP good. VSS.   Objective: Vital signs in last 24 hours: Temp:  [97.3 F (36.3 C)-99 F (37.2 C)] 99 F (37.2 C) (06/18 0458) Pulse Rate:  [110-111] 110 (06/18 0458) Resp:  [12-15] 14 (06/18 0458) BP: (96-138)/(53-81) 96/53 (06/18 0458) SpO2:  [94 %-99 %] 96 % (06/18 0458) Last BM Date: 03/24/17  Intake/Output from previous day: 06/17 0701 - 06/18 0700 In: 5645 [P.O.:960; I.V.:4485; IV Piggyback:200] Out: 2675 [Urine:2675] Intake/Output this shift: No intake/output data recorded.  PE: Gen:  Alert, NAD, pleasant Card:  Regular rate and rhythm, no M/G/R Pulm:  Normal effort, clear to auscultation bilaterally Abd: Soft, non-tender, non-distended, bowel sounds present in all 4 quadrants. Incisions without induration, drainage or erythema. Ecchymosis around RUQ incision, no palpable hematoma.  Skin: warm and dry, no rashes  Psych: A&Ox3   Lab Results:   Recent Labs  03/26/17 0629 03/27/17 0459  WBC 20.1* 9.1  HGB 12.8* 10.8*  HCT 36.9* 32.7*  PLT 267 172   BMET  Recent Labs  03/26/17 0629 03/27/17 0459  NA 137 139  K 4.3 3.6  CL 102 104  CO2 23 23  GLUCOSE 135* 101*  BUN 11 12  CREATININE 1.20 1.23  CALCIUM 9.2 8.4*   CMP     Component Value Date/Time   NA 139 03/27/2017 0459   NA 134 (L) 12/10/2013 2132   K 3.6 03/27/2017 0459   K 4.5 12/10/2013 2132   CL 104 03/27/2017 0459   CL 104 12/10/2013 2132   CO2 23 03/27/2017 0459   CO2 26 12/10/2013 2132   GLUCOSE 101 (H) 03/27/2017 0459   GLUCOSE 86 12/10/2013 2132   BUN 12 03/27/2017 0459   BUN 15 12/10/2013 2132   CREATININE 1.23 03/27/2017 0459   CREATININE 1.14 12/10/2013 2132   CALCIUM 8.4 (L) 03/27/2017 0459   CALCIUM 8.2 (L) 12/10/2013 2132   PROT 7.0 03/25/2017 0909   PROT 7.8 12/10/2013 2132    ALBUMIN 4.0 03/25/2017 0909   ALBUMIN 3.5 12/10/2013 2132   AST 19 03/25/2017 0909   AST 30 12/10/2013 2132   ALT 14 (L) 03/25/2017 0909   ALT 32 12/10/2013 2132   ALKPHOS 54 03/25/2017 0909   ALKPHOS 47 12/10/2013 2132   BILITOT 0.5 03/25/2017 0909   BILITOT 0.3 12/10/2013 2132   GFRNONAA >60 03/27/2017 0459   GFRNONAA >60 12/10/2013 2132   GFRAA >60 03/27/2017 0459   GFRAA >60 12/10/2013 2132   Lipase     Component Value Date/Time   LIPASE 21 03/25/2017 0909    Anti-infectives: Anti-infectives    Start     Dose/Rate Route Frequency Ordered Stop   03/26/17 0900  piperacillin-tazobactam (ZOSYN) IVPB 3.375 g     3.375 g 12.5 mL/hr over 240 Minutes Intravenous Every 8 hours 03/26/17 0759     03/25/17 2200  piperacillin-tazobactam (ZOSYN) IVPB 3.375 g     3.375 g 12.5 mL/hr over 240 Minutes Intravenous Every 8 hours 03/25/17 1807 03/26/17 0242   03/25/17 1415  piperacillin-tazobactam (ZOSYN) IVPB 3.375 g     3.375 g 100 mL/hr over 30 Minutes Intravenous  Once 03/25/17 1407 03/25/17 1438   03/25/17 1404  piperacillin-tazobactam (ZOSYN) 3.375 GM/50ML IVPB    Comments:  Bridget Hartshorn   : cabinet override      03/25/17 1404  03/25/17 1428       Assessment/Plan Appendicitis S/P laparoscopic appendectomy with extensive lysis of adhesions - 03/25/17 Dr. Hassell Done - WBC 9.1 yesterday, afebrile - Zosyn (6/15>>) - can d/c - d/c PCA, encourage PO pain medication  - advance diet   Hx of Hodgkin's Lymphoma - 1990's Bipolar - transferring care to [facility] Hep C HTN Hx of Substance abuse Hx of stroke - full recovery Seeking disability  FEN - Advance to regular diet VTE - SQ heparin ID - IV Zosyn d/c, does not need to be discharged on abx  Dispo: Possibly home later today if tolerating regular diet and pain controlled. Will need to f/u in our office in 2-3 wks.  LOS: 2 days    Brigid Re , [facility] Surgery 03/28/2017, 8:30 AM Pager: [phone] Consults:  [phone] Mon-Fri 7:00 am-4:30 pm Sat-Sun 7:00 am-11:30 am  Agree with above. Okay to go home.  He has 3 friends in the room with him.  Alphonsa Overall, MD, [facility] Surgery Pager: [phone] Office phone:  [phone]

## 2017-03-28 NOTE — Progress Notes (Signed)
Patient tolerating diet, ambulating without assistance and pain controlled with oral meds. Iv discontinued. Discharge instructions discussed. Pt able to answer questions about when to call md. And lifting restrictions.

## 2017-03-28 NOTE — Anesthesia Postprocedure Evaluation (Signed)
Anesthesia Post Note  Patient: Troy [name]  Procedure(s) Performed: Procedure(s) (LRB): APPENDECTOMY LAPAROSCOPIC; EXTENSIVE LYSIS OF ADHESIONS FROM PRIOR RADIATION AND SURGERY (N/A)     Patient location during evaluation: PACU Anesthesia Type: General Level of consciousness: awake and alert Pain management: pain level controlled Vital Signs Assessment: post-procedure vital signs reviewed and stable Respiratory status: spontaneous breathing, nonlabored ventilation, respiratory function stable and patient connected to nasal cannula oxygen Cardiovascular status: blood pressure returned to baseline and stable Postop Assessment: no signs of nausea or vomiting Anesthetic complications: no    Last Vitals:  Vitals:   03/28/17 0342 03/28/17 0458  BP:  (!) 96/53  Pulse:  (!) 110  Resp: 15 14  Temp:  37.2 C    Last Pain:  Vitals:   03/28/17 0811  TempSrc:   PainSc: 6                  Kadrian Partch EDWARD

## 2017-07-22 DIAGNOSIS — R400 Somnolence: Secondary | ICD-10-CM | POA: Insufficient documentation

## 2018-08-08 ENCOUNTER — Other Ambulatory Visit: Payer: Self-pay

## 2018-08-08 ENCOUNTER — Encounter (HOSPITAL_BASED_OUTPATIENT_CLINIC_OR_DEPARTMENT_OTHER): Payer: Self-pay

## 2018-08-08 DIAGNOSIS — Z5321 Procedure and treatment not carried out due to patient leaving prior to being seen by health care provider: Secondary | ICD-10-CM | POA: Diagnosis not present

## 2018-08-08 DIAGNOSIS — Y998 Other external cause status: Secondary | ICD-10-CM | POA: Insufficient documentation

## 2018-08-08 DIAGNOSIS — S6992XA Unspecified injury of left wrist, hand and finger(s), initial encounter: Secondary | ICD-10-CM | POA: Diagnosis not present

## 2018-08-08 DIAGNOSIS — Y929 Unspecified place or not applicable: Secondary | ICD-10-CM | POA: Diagnosis not present

## 2018-08-08 DIAGNOSIS — Y9389 Activity, other specified: Secondary | ICD-10-CM | POA: Insufficient documentation

## 2018-08-08 DIAGNOSIS — W272XXA Contact with scissors, initial encounter: Secondary | ICD-10-CM | POA: Insufficient documentation

## 2018-08-08 NOTE — ED Triage Notes (Signed)
Pt cut left thumb with a pair of scissors, very small, less than 0.5cm cut to inside of thumb that is not currently bleeding

## 2018-08-09 ENCOUNTER — Emergency Department (HOSPITAL_BASED_OUTPATIENT_CLINIC_OR_DEPARTMENT_OTHER)
Admission: EM | Admit: 2018-08-09 | Discharge: 2018-08-09 | Disposition: A | Discharge status: Home / Self Care | Payer: Medicare Other | Attending: Emergency Medicine | Admitting: Emergency Medicine

## 2018-08-09 ENCOUNTER — Emergency Department (HOSPITAL_BASED_OUTPATIENT_CLINIC_OR_DEPARTMENT_OTHER)
Admission: EM | Admit: 2018-08-09 | Discharge: 2018-08-09 | Disposition: A | Discharge status: Home / Self Care | Payer: Medicare Other | Source: Home / Self Care | Attending: Emergency Medicine | Admitting: Emergency Medicine

## 2018-08-09 ENCOUNTER — Encounter (HOSPITAL_BASED_OUTPATIENT_CLINIC_OR_DEPARTMENT_OTHER): Payer: Self-pay | Admitting: *Deleted

## 2018-08-09 ENCOUNTER — Other Ambulatory Visit: Payer: Self-pay

## 2018-08-09 DIAGNOSIS — Z79899 Other long term (current) drug therapy: Secondary | ICD-10-CM

## 2018-08-09 DIAGNOSIS — Y9389 Activity, other specified: Secondary | ICD-10-CM

## 2018-08-09 DIAGNOSIS — S61012A Laceration without foreign body of left thumb without damage to nail, initial encounter: Secondary | ICD-10-CM

## 2018-08-09 DIAGNOSIS — W272XXA Contact with scissors, initial encounter: Secondary | ICD-10-CM

## 2018-08-09 DIAGNOSIS — I10 Essential (primary) hypertension: Secondary | ICD-10-CM | POA: Insufficient documentation

## 2018-08-09 DIAGNOSIS — E079 Disorder of thyroid, unspecified: Secondary | ICD-10-CM | POA: Insufficient documentation

## 2018-08-09 DIAGNOSIS — Y92009 Unspecified place in unspecified non-institutional (private) residence as the place of occurrence of the external cause: Secondary | ICD-10-CM

## 2018-08-09 DIAGNOSIS — Z23 Encounter for immunization: Secondary | ICD-10-CM | POA: Insufficient documentation

## 2018-08-09 DIAGNOSIS — Y999 Unspecified external cause status: Secondary | ICD-10-CM | POA: Insufficient documentation

## 2018-08-09 DIAGNOSIS — J45909 Unspecified asthma, uncomplicated: Secondary | ICD-10-CM

## 2018-08-09 MED ORDER — TETANUS-DIPHTH-ACELL PERTUSSIS 5-2.5-18.5 LF-MCG/0.5 IM SUSP
0.5000 mL | Freq: Once | INTRAMUSCULAR | Status: AC
  Administered 2018-08-09: 0.5 mL via INTRAMUSCULAR
  Filled 2018-08-09: qty 0.5

## 2018-08-09 NOTE — ED Notes (Signed)
No answer and no one in lobby

## 2018-08-09 NOTE — ED Triage Notes (Signed)
Pt reports cutting his left thumbnail 2 weeks ago, was cutting bandage off last night and scissors cut his thumb. No active bleeding noted.

## 2018-08-09 NOTE — ED Provider Notes (Signed)
[facility] EMERGENCY DEPARTMENT Provider Note   CSN: [id number] Arrival date & time: 08/09/18  2694     History   Chief Complaint Chief Complaint  Patient presents with  . Finger Injury    HPI Troy [name] is a 42 y.o. male.  HPI Patient presents with left thumb injury.  Happened last night.  States he was trying to cut a wound dressing off his right thumb with some scissors and he caught his skin.  States he came in last night to the ER because of the wound and that it would not stop bleeding.  States it was too long to wait soak and he had Artie taken a sleeping pill so he came back this morning.  States he went to an urgent care what they would not take him because of insurance.  States now he feels silly being here because the wound has closed and it is no longer bleeding.  Does not know when his last tetanus was. Past Medical History:  Diagnosis Date  . Asthma   . Barrett's esophagus   . Bipolar 1 disorder ([place])   . Drug abuse ([place])   . GI bleed   . Hepatitis    Hep C,   . Hodgkin disease ([place])   . Hodgkin's disease ([place])   . Hypertension   . Pulmonary fibrosis ([place])   . Stroke ([place])   . Thyroid cancer [facility])     Patient Active Problem List   Diagnosis Date Noted  . Appendicitis 03/25/2017  . Stroke ([place]) 07/17/2015  . Substance induced mood disorder ([place])   . Cerebrovascular accident (CVA) [facility])     Past Surgical History:  Procedure Laterality Date  . LAPAROSCOPIC APPENDECTOMY N/A 03/25/2017   Procedure: APPENDECTOMY LAPAROSCOPIC; EXTENSIVE LYSIS OF ADHESIONS FROM PRIOR RADIATION AND SURGERY;  Surgeon: Johnathan Hausen, MD;  Location: [facility];  Service: General;  Laterality: N/A;  . PARATHYROIDECTOMY    . spleenectomy    . TEE WITHOUT CARDIOVERSION N/A 07/18/2015   Procedure: TRANSESOPHAGEAL ECHOCARDIOGRAM (TEE);  Surgeon: Thayer Headings, MD;  Location: [place];  Service: Cardiovascular;  Laterality: N/A;  . THYROIDECTOMY          Home  Medications    Prior to Admission medications   Medication Sig Start Date End Date Taking? Authorizing Provider  acetaZOLAMIDE (DIAMOX) 500 MG capsule Take 500 mg by mouth 2 (two) times daily.    [provider]  albuterol (PROVENTIL HFA;VENTOLIN HFA) 108 (90 BASE) MCG/ACT inhaler Inhale 2 puffs into the lungs every 6 (six) hours as needed. For shortness of breath.    [provider]  albuterol (PROVENTIL) (5 MG/ML) 0.5% nebulizer solution     [provider]  amLODipine (NORVASC) 10 MG tablet  11/11/15   [provider]  ammonium lactate (LAC-HYDRIN) 12 % lotion Apply 1 application topically as needed for dry skin.    [provider]  aspirin 325 MG tablet Take 1 tablet (325 mg total) by mouth daily. 07/18/15   Ghimire, Henreitta Leber, MD  buPROPion (WELLBUTRIN XL) 300 MG 24 hr tablet Take 300 mg by mouth daily.    [provider]  etodolac (LODINE) 500 MG tablet Take 1 tablet (500 mg total) by mouth 2 (two) times daily. 03/27/16   Dorie Rank, MD  fluticasone Asencion Islam) 50 MCG/ACT nasal spray Place into both nostrils daily.    [provider]  Fluticasone-Salmeterol (ADVAIR) 250-50 MCG/DOSE AEPB Inhale 1 puff into the lungs every  12 (twelve) hours.    [provider]  HYDROcodone-acetaminophen (NORCO/VICODIN) 5-325 MG tablet Take 1-2 tablets by mouth every 4 (four) hours as needed for moderate pain. 03/28/17   Rayburn, Floyce Stakes, PA-C  levothyroxine (SYNTHROID, LEVOTHROID) 112 MCG tablet Take 250 mcg by mouth daily.     [provider]  lisinopril (PRINIVIL,ZESTRIL) 20 MG tablet Take 20 mg by mouth.     [provider]  montelukast (SINGULAIR) 10 MG tablet  11/11/15   [provider]  ondansetron (ZOFRAN) 4 MG tablet Take 4 mg by mouth every 8 (eight) hours as needed for nausea or vomiting.    [provider]  pantoprazole (PROTONIX) 40 MG tablet Take 40 mg by mouth daily.    [provider]    QUEtiapine (SEROQUEL) 400 MG tablet Take 800 mg by mouth at bedtime.    [provider]  rosuvastatin (CRESTOR) 40 MG tablet  11/11/15   [provider]  sertraline (ZOLOFT) 100 MG tablet Take 100 mg by mouth daily.    [provider]  traZODone (DESYREL) 150 MG tablet Take by mouth at bedtime.    [provider]  zaleplon (SONATA) 10 MG capsule Take 10 mg by mouth at bedtime as needed for sleep (takes 10-20 mg).    [provider]    Family History Family History  Problem Relation Age of Onset  . Stroke Maternal Grandmother     Social History Social History   Tobacco Use  . Smoking status: Former Smoker    Types: Cigarettes  . Smokeless tobacco: Never Used  Substance Use Topics  . Alcohol use: Yes    Comment: occasionally  . Drug use: Yes    Types: Marijuana     Allergies   Ciprofloxacin; Thelma Barge officinalis]; and Fluocinolone   Review of Systems Review of Systems  Skin: Positive for wound.  Neurological: Negative for weakness and numbness.  Hematological: Does not bruise/bleed easily.     Physical Exam Updated Vital Signs BP 135/88 (BP Location: Right Arm)   Pulse (!) 105   Temp 98 F (36.7 C)   Resp 14   Ht 6' (1.829 m)   Wt 106.1 kg   SpO2 100%   BMI 31.74 kg/m   Physical Exam  Constitutional: He appears well-developed.  Skin: Skin is warm.  On the palmar aspect of the left thumb there is 1/2 cm horizontal laceration that is well approximated.  No active bleeding.  Good flexion and extension of the finger.  Sensation intact distally.  The wound is roughly distal to the first MCP joint.     ED Treatments / Results  Labs (all labs ordered are listed, but only abnormal results are displayed) Labs Reviewed - No data to display  EKG None  Radiology No results found.  Procedures Procedures (including critical care time)  Medications Ordered in ED Medications  Tdap (BOOSTRIX) injection 0.5 mL  (has no administration in time range)     Initial Impression / Assessment and Plan / ED Course  I have reviewed the triage vital signs and the nursing notes.  Pertinent labs & imaging results that were available during my care of the patient were reviewed by me and considered in my medical decision making (see chart for details).     Patient with laceration.  From last night and well approximated now.  Will not do further closing of the wound.  Tetanus is been updated.  Discharge home.  Final Clinical  Impressions(s) / ED Diagnoses   Final diagnoses:  Laceration of left thumb without foreign body without damage to nail, initial encounter    ED Discharge Orders    None       Davonna Belling, MD 08/09/18 [phone]

## 2018-12-19 ENCOUNTER — Emergency Department (HOSPITAL_BASED_OUTPATIENT_CLINIC_OR_DEPARTMENT_OTHER): Payer: Medicare Other

## 2018-12-19 ENCOUNTER — Encounter (HOSPITAL_BASED_OUTPATIENT_CLINIC_OR_DEPARTMENT_OTHER): Payer: Self-pay | Admitting: Emergency Medicine

## 2018-12-19 ENCOUNTER — Emergency Department (HOSPITAL_BASED_OUTPATIENT_CLINIC_OR_DEPARTMENT_OTHER)
Admission: EM | Admit: 2018-12-19 | Discharge: 2018-12-19 | Disposition: A | Discharge status: Home / Self Care | Payer: Medicare Other | Attending: Emergency Medicine | Admitting: Emergency Medicine

## 2018-12-19 ENCOUNTER — Other Ambulatory Visit: Payer: Self-pay

## 2018-12-19 DIAGNOSIS — Z8673 Personal history of transient ischemic attack (TIA), and cerebral infarction without residual deficits: Secondary | ICD-10-CM | POA: Diagnosis not present

## 2018-12-19 DIAGNOSIS — I10 Essential (primary) hypertension: Secondary | ICD-10-CM | POA: Insufficient documentation

## 2018-12-19 DIAGNOSIS — Z8571 Personal history of Hodgkin lymphoma: Secondary | ICD-10-CM | POA: Diagnosis not present

## 2018-12-19 DIAGNOSIS — R519 Headache, unspecified: Secondary | ICD-10-CM

## 2018-12-19 DIAGNOSIS — Z87891 Personal history of nicotine dependence: Secondary | ICD-10-CM | POA: Insufficient documentation

## 2018-12-19 DIAGNOSIS — Z79899 Other long term (current) drug therapy: Secondary | ICD-10-CM | POA: Diagnosis not present

## 2018-12-19 DIAGNOSIS — R51 Headache: Secondary | ICD-10-CM | POA: Insufficient documentation

## 2018-12-19 DIAGNOSIS — Z85850 Personal history of malignant neoplasm of thyroid: Secondary | ICD-10-CM | POA: Diagnosis not present

## 2018-12-19 DIAGNOSIS — F1210 Cannabis abuse, uncomplicated: Secondary | ICD-10-CM | POA: Diagnosis not present

## 2018-12-19 LAB — CBC WITH DIFFERENTIAL/PLATELET
Abs Immature Granulocytes: 0.04 10*3/uL (ref 0.00–0.07)
BASOS ABS: 0.1 10*3/uL (ref 0.0–0.1)
Basophils Relative: 1 %
EOS ABS: 0.6 10*3/uL — AB (ref 0.0–0.5)
EOS PCT: 5 %
HCT: 42 % (ref 39.0–52.0)
HEMOGLOBIN: 13.1 g/dL (ref 13.0–17.0)
Immature Granulocytes: 0 %
LYMPHS PCT: 13 %
Lymphs Abs: 1.7 10*3/uL (ref 0.7–4.0)
MCH: 26.9 pg (ref 26.0–34.0)
MCHC: 31.2 g/dL (ref 30.0–36.0)
MCV: 86.2 fL (ref 80.0–100.0)
MONO ABS: 1.4 10*3/uL — AB (ref 0.1–1.0)
Monocytes Relative: 11 %
NRBC: 0 % (ref 0.0–0.2)
Neutro Abs: 9.2 10*3/uL — ABNORMAL HIGH (ref 1.7–7.7)
Neutrophils Relative %: 70 %
Platelets: 306 10*3/uL (ref 150–400)
RBC: 4.87 MIL/uL (ref 4.22–5.81)
RDW: 20.8 % — AB (ref 11.5–15.5)
WBC: 12.9 10*3/uL — AB (ref 4.0–10.5)

## 2018-12-19 LAB — BASIC METABOLIC PANEL
Anion gap: 7 (ref 5–15)
BUN: 16 mg/dL (ref 6–20)
CO2: 23 mmol/L (ref 22–32)
CREATININE: 1.04 mg/dL (ref 0.61–1.24)
Calcium: 9.1 mg/dL (ref 8.9–10.3)
Chloride: 105 mmol/L (ref 98–111)
GFR calc non Af Amer: >60 mL/min (ref >60)
Glucose, Bld: 132 mg/dL — ABNORMAL HIGH (ref 70–99)
Potassium: 3.7 mmol/L (ref 3.5–5.1)
SODIUM: 135 mmol/L (ref 135–145)

## 2018-12-19 MED ORDER — LABETALOL HCL 5 MG/ML IV SOLN
20.0000 mg | Freq: Once | INTRAVENOUS | Status: AC
  Administered 2018-12-19: 20 mg via INTRAVENOUS
  Filled 2018-12-19: qty 4

## 2018-12-19 MED ORDER — LABETALOL HCL 5 MG/ML IV SOLN
10.0000 mg | Freq: Once | INTRAVENOUS | Status: AC
  Administered 2018-12-19: 10 mg via INTRAVENOUS
  Filled 2018-12-19: qty 4

## 2018-12-19 MED ORDER — LISINOPRIL-HYDROCHLOROTHIAZIDE 20-25 MG PO TABS: 1.0000 | ORAL_TABLET | Freq: Every day | ORAL | 0 refills | Status: DC

## 2018-12-19 MED ORDER — ACETAMINOPHEN 325 MG PO TABS
650.0000 mg | ORAL_TABLET | Freq: Once | ORAL | Status: AC
  Administered 2018-12-19: 650 mg via ORAL
  Filled 2018-12-19: qty 2

## 2018-12-19 MED ORDER — LISINOPRIL 10 MG PO TABS
20.0000 mg | ORAL_TABLET | Freq: Once | ORAL | Status: AC
  Administered 2018-12-19: 20 mg via ORAL
  Filled 2018-12-19: qty 2

## 2018-12-19 MED FILL — LISINOPRIL-HCTZ 20-25 MG TA: 20-25 | 14 days supply | Qty: 14 | Fill #0

## 2018-12-19 NOTE — ED Notes (Signed)
Pt concerned about b/p not improved prior to discharge.  Dr Melina Copa at bedside, suggest pt stay to recheck b/p

## 2018-12-19 NOTE — Discharge Instructions (Signed)
You were seen in the emergency department for elevated blood pressure and headaches.  Your blood pressure was improved here with some IV and oral medication.  It will be important for you to take your medication as directed by your doctor.  Please follow-up with your doctor and return if any worsening symptoms.

## 2018-12-19 NOTE — ED Triage Notes (Signed)
Pt states he has been out of HTN meds since Friday and having severe headache today 10/10.

## 2018-12-19 NOTE — ED Notes (Signed)
Patient verbalizes understanding of discharge instructions. Opportunity for questioning and answers were provided. Armband removed by staff, pt discharged from ED ambulatory.   

## 2018-12-19 NOTE — ED Provider Notes (Signed)
[facility] Provider Note   CSN: [id number] Arrival date & time: 12/19/18  1106    History   Chief Complaint Chief Complaint  Patient presents with  . Hypertension    HPI Troy [name] is a 43 y.o. male.  He has a history of cancer in remission along with hypertension.  His primary care doctor increased his lisinopril from 10/12.5 to 20/25 about a month ago for elevated blood pressures.  He was unable to get them refilled and has been out of his medicine since Friday.  He is complaining of elevated blood pressure and headache.  He says the headache is actually been there since December and is associated with a stressful home situation.  He is concerned about the blood pressure as he has had a stroke secondary to hypertension in the past.  He had significant right-sided deficits and speech deficits at that time although is almost completely resolved.  No new numbness or weakness.  No blurry vision double vision chest pain shortness of breath.     The history is provided by the patient.  Hypertension  This is a recurrent problem. The problem occurs constantly. The problem has not changed since onset.Associated symptoms include headaches. Pertinent negatives include no chest pain, no abdominal pain and no shortness of breath. Nothing aggravates the symptoms. Nothing relieves the symptoms. He has tried nothing for the symptoms. The treatment provided no relief.    Past Medical History:  Diagnosis Date  . Asthma   . Barrett's esophagus   . Bipolar 1 disorder ([place])   . Drug abuse ([place])   . GI bleed   . Hepatitis    Hep C,   . Hodgkin disease ([place])   . Hodgkin's disease ([place])   . Hypertension   . Pulmonary fibrosis ([place])   . Stroke ([place])   . Thyroid cancer [facility])     Patient Active Problem List   Diagnosis Date Noted  . Appendicitis 03/25/2017  . Stroke ([place]) 07/17/2015  . Substance induced mood disorder ([place])   . Cerebrovascular accident (CVA)  [facility])     Past Surgical History:  Procedure Laterality Date  . LAPAROSCOPIC APPENDECTOMY N/A 03/25/2017   Procedure: APPENDECTOMY LAPAROSCOPIC; EXTENSIVE LYSIS OF ADHESIONS FROM PRIOR RADIATION AND SURGERY;  Surgeon: Johnathan Hausen, MD;  Location: [facility];  Service: General;  Laterality: N/A;  . PARATHYROIDECTOMY    . spleenectomy    . TEE WITHOUT CARDIOVERSION N/A 07/18/2015   Procedure: TRANSESOPHAGEAL ECHOCARDIOGRAM (TEE);  Surgeon: Thayer Headings, MD;  Location: [place];  Service: Cardiovascular;  Laterality: N/A;  . THYROIDECTOMY          Home Medications    Prior to Admission medications   Medication Sig Start Date End Date Taking? Authorizing Provider  acetaZOLAMIDE (DIAMOX) 500 MG capsule Take 500 mg by mouth 2 (two) times daily.    [provider]  albuterol (PROVENTIL HFA;VENTOLIN HFA) 108 (90 BASE) MCG/ACT inhaler Inhale 2 puffs into the lungs every 6 (six) hours as needed. For shortness of breath.    [provider]  albuterol (PROVENTIL) (5 MG/ML) 0.5% nebulizer solution     [provider]  amLODipine (NORVASC) 10 MG tablet  11/11/15   [provider]  ammonium lactate (LAC-HYDRIN) 12 % lotion Apply 1 application topically as needed for dry skin.    [provider]  aspirin 325 MG tablet Take 1 tablet (325 mg total) by mouth daily. 07/18/15   Ghimire, Henreitta Leber,  MD  buPROPion (WELLBUTRIN XL) 300 MG 24 hr tablet Take 300 mg by mouth daily.    [provider]  etodolac (LODINE) 500 MG tablet Take 1 tablet (500 mg total) by mouth 2 (two) times daily. 03/27/16   Dorie Rank, MD  fluticasone Asencion Islam) 50 MCG/ACT nasal spray Place into both nostrils daily.    [provider]  Fluticasone-Salmeterol (ADVAIR) 250-50 MCG/DOSE AEPB Inhale 1 puff into the lungs every 12 (twelve) hours.    [provider]  HYDROcodone-acetaminophen (NORCO/VICODIN) 5-325 MG tablet Take 1-2 tablets by mouth every 4 (four) hours as  needed for moderate pain. 03/28/17   Rayburn, Floyce Stakes, PA-C  levothyroxine (SYNTHROID, LEVOTHROID) 112 MCG tablet Take 250 mcg by mouth daily.     [provider]  lisinopril (PRINIVIL,ZESTRIL) 20 MG tablet Take 20 mg by mouth.     [provider]  montelukast (SINGULAIR) 10 MG tablet  11/11/15   [provider]  ondansetron (ZOFRAN) 4 MG tablet Take 4 mg by mouth every 8 (eight) hours as needed for nausea or vomiting.    [provider]  pantoprazole (PROTONIX) 40 MG tablet Take 40 mg by mouth daily.    [provider]  QUEtiapine (SEROQUEL) 400 MG tablet Take 800 mg by mouth at bedtime.    [provider]  rosuvastatin (CRESTOR) 40 MG tablet  11/11/15   [provider]  sertraline (ZOLOFT) 100 MG tablet Take 100 mg by mouth daily.    [provider]  traZODone (DESYREL) 150 MG tablet Take by mouth at bedtime.    [provider]  zaleplon (SONATA) 10 MG capsule Take 10 mg by mouth at bedtime as needed for sleep (takes 10-20 mg).    [provider]    Family History Family History  Problem Relation Age of Onset  . Stroke Maternal Grandmother     Social History Social History   Tobacco Use  . Smoking status: Former Smoker    Types: Cigarettes  . Smokeless tobacco: Never Used  Substance Use Topics  . Alcohol use: Yes    Comment: occasionally  . Drug use: Yes    Types: Marijuana     Allergies   Ciprofloxacin; Thelma Barge officinalis]; and Fluocinolone   Review of Systems Review of Systems  Constitutional: Negative for fever.  HENT: Negative for sore throat.   Eyes: Negative for visual disturbance.  Respiratory: Negative for shortness of breath.   Cardiovascular: Negative for chest pain.  Gastrointestinal: Negative for abdominal pain.  Genitourinary: Negative for dysuria.  Musculoskeletal: Negative for neck pain.  Skin: Negative for rash.  Neurological: Positive for headaches.      Physical Exam Updated Vital Signs BP 131/71   Pulse 100   Temp 98.2 F (36.8 C) (Oral)   Resp 10   SpO2 100%   Physical Exam Vitals signs and nursing note reviewed.  Constitutional:      Appearance: He is well-developed.  HENT:     Head: Normocephalic and atraumatic.  Eyes:     Conjunctiva/sclera: Conjunctivae normal.  Neck:     Musculoskeletal: Neck supple.  Cardiovascular:     Rate and Rhythm: Regular rhythm. Tachycardia present.     Heart sounds: No murmur.  Pulmonary:     Effort: Pulmonary effort is normal. No respiratory distress.     Breath sounds: Normal breath sounds.  Abdominal:     Palpations: Abdomen is soft.     Tenderness: There is no abdominal tenderness.  Musculoskeletal: Normal range of motion.     Right lower leg: No edema.     Left lower leg: No edema.  Skin:    General: Skin is warm and dry.     Capillary Refill: Capillary refill takes less than 2 seconds.  Neurological:     Mental Status: He is alert and oriented to person, place, and time. Mental status is at baseline.      ED Treatments / Results  Labs (all labs ordered are listed, but only abnormal results are displayed) Labs Reviewed  BASIC METABOLIC PANEL - Abnormal; Notable for the following components:      Result Value   Glucose, Bld 132 (*)    All other components within normal limits  CBC WITH DIFFERENTIAL/PLATELET - Abnormal; Notable for the following components:   WBC 12.9 (*)    RDW 20.8 (*)    Neutro Abs 9.2 (*)    Monocytes Absolute 1.4 (*)    Eosinophils Absolute 0.6 (*)    All other components within normal limits    EKG EKG Interpretation  Date/Time:  Tuesday December 19 2018 11:23:23 EDT Ventricular Rate:  108 PR Interval:    QRS Duration: 82 QT Interval:  334 QTC Calculation: 448 R Axis:   15 Text Interpretation:  Sinus tachycardia Probable left atrial enlargement similar to prior 6/18 Confirmed by Aletta Edouard [id number]) on 12/19/2018 11:37:37  AM   Radiology Dg Chest 2 View  Result Date: 12/19/2018 CLINICAL DATA:  Hypertension.  Chest pressure EXAM: CHEST - 2 VIEW COMPARISON:  December 12, 2015 FINDINGS: Lungs are clear. Heart size and pulmonary vascularity are normal. No adenopathy. No pneumothorax. No bone lesions. There are surgical clips in the left lower neck and cervicothoracic junction regions, stable. IMPRESSION: No edema or consolidation. Electronically Signed   By: Lowella Grip III M.D.   On: 12/19/2018 15:27    Procedures Procedures (including critical care time)  Medications Ordered in ED Medications  labetalol (NORMODYNE,TRANDATE) injection 10 mg (has no administration in time range)     Initial Impression / Assessment and Plan / ED Course  I have reviewed the triage vital signs and the nursing notes.  Pertinent labs & imaging results that were available during my care of the patient were reviewed by me and considered in my medical decision making (see chart for details).  Clinical Course as of Dec 18 1800  Tue Dec 19, 2018  1436 Patient here with elevated blood pressure in the setting of not taking his medication for a few days.  He is complaining of headache although it sounds like he has had headaches going on for a few months.  His lab work is fairly unremarkable.  He received IV dose of some medication and an oral dose of his medication.  I will prescribe him a few weeks of his blood pressure medication until he can follow-up with his doctor.   [MB]  3220 Patient now informs me that we have not addressed that he is been feeling shortness of breath for a little while.  He is also concerned that his blood pressure still has not completely normalized.  We will recheck some vitals and ordered him a chest x-ray.   [MB]  2542 Chest x-ray negative.  I restenosed him with some IV labetalol and his pressures down to a more reasonable level.  He understands to follow-up with his doctor and return if any worsening  symptoms.   [MB]    Clinical Course User Index [  MB] Hayden Rasmussen, MD        Final Clinical Impressions(s) / ED Diagnoses   Final diagnoses:  Essential hypertension  Generalized headache    ED Discharge Orders         Ordered    lisinopril-hydrochlorothiazide (PRINZIDE,ZESTORETIC) 20-25 MG tablet  Daily     12/19/18 1438           Hayden Rasmussen, MD 12/19/18 [phone]

## 2019-01-11 DIAGNOSIS — R7303 Prediabetes: Secondary | ICD-10-CM | POA: Insufficient documentation

## 2019-06-12 HISTORY — PX: INCISIONAL HERNIA REPAIR: SHX193

## 2019-07-04 HISTORY — PX: COLONOSCOPY: SHX174

## 2020-01-17 DIAGNOSIS — R160 Hepatomegaly, not elsewhere classified: Secondary | ICD-10-CM | POA: Insufficient documentation

## 2020-01-17 DIAGNOSIS — F5102 Adjustment insomnia: Secondary | ICD-10-CM | POA: Insufficient documentation

## 2020-01-17 DIAGNOSIS — F5104 Psychophysiologic insomnia: Secondary | ICD-10-CM | POA: Insufficient documentation

## 2020-01-18 DIAGNOSIS — D500 Iron deficiency anemia secondary to blood loss (chronic): Secondary | ICD-10-CM | POA: Insufficient documentation

## 2020-01-18 DIAGNOSIS — R809 Proteinuria, unspecified: Secondary | ICD-10-CM | POA: Insufficient documentation

## 2020-04-23 DIAGNOSIS — G909 Disorder of the autonomic nervous system, unspecified: Secondary | ICD-10-CM | POA: Insufficient documentation

## 2020-05-01 DIAGNOSIS — M25522 Pain in left elbow: Secondary | ICD-10-CM | POA: Insufficient documentation

## 2020-06-09 DIAGNOSIS — Z85850 Personal history of malignant neoplasm of thyroid: Secondary | ICD-10-CM | POA: Insufficient documentation

## 2020-07-25 DIAGNOSIS — M25562 Pain in left knee: Secondary | ICD-10-CM | POA: Insufficient documentation

## 2020-08-12 ENCOUNTER — Encounter: Payer: Self-pay | Admitting: Allergy & Immunology

## 2020-08-12 ENCOUNTER — Other Ambulatory Visit: Payer: Self-pay

## 2020-08-12 ENCOUNTER — Ambulatory Visit (INDEPENDENT_AMBULATORY_CARE_PROVIDER_SITE_OTHER): Payer: Medicare Other | Admitting: Allergy & Immunology

## 2020-08-12 VITALS — Temp 98.7°F | Resp 18 | Ht 72.0 in | Wt 218.0 lb

## 2020-08-12 DIAGNOSIS — Z560 Unemployment, unspecified: Secondary | ICD-10-CM | POA: Diagnosis not present

## 2020-08-12 DIAGNOSIS — J4540 Moderate persistent asthma, uncomplicated: Secondary | ICD-10-CM

## 2020-08-12 DIAGNOSIS — Z859 Personal history of malignant neoplasm, unspecified: Secondary | ICD-10-CM | POA: Diagnosis not present

## 2020-08-12 DIAGNOSIS — J310 Chronic rhinitis: Secondary | ICD-10-CM

## 2020-08-12 MED ORDER — TRELEGY ELLIPTA 100-62.5-25 MCG/INH IN AEPB: 1.0000 | INHALATION_SPRAY | Freq: Every day | RESPIRATORY_TRACT | 5 refills | Status: DC

## 2020-08-12 NOTE — Progress Notes (Signed)
NEW PATIENT  Date of Service/Encounter:  08/12/20  Referring provider: Thornton Dales I, MD   Assessment:   Moderate persistent asthma - consider methacholine challenge if history still unclear at the next visit, but appears to respond to a controller medication  Elevated AEC in the past (600 in March 2020)  Chronic rhinitis  Complicated past medical history, including Hodgkin's lymphoma at age 44 and subsequent malignancies since that time   Plan/Recommendations:   1. Moderate persistent asthma, uncomplicated - Lung testing actually looked normal today, but it did improve with albuterol. - We going to get some labs to rule out serious causes of difficult to control asthma. - We are also going to see whether he would still qualify for one of her injectable medicines for asthma such as Nucala or Fasenra (Troy [name] would prefer the Nucala since it has a longer safety track record, which is important given your complicated past medical history).  - Stop the Breo and start Trelegy 1 puff once daily.  - Trelegy contains Breo +1 more medicine to help control asthma. - Daily controller medication(s): Trelegy 100/62.5/25 one puff once daily - Prior to physical activity: albuterol 2 puffs 10-15 minutes before physical activity. - Rescue medications: albuterol 4 puffs every 4-6 hours as needed - Asthma control goals:  * Full participation in all desired activities (may need albuterol before activity) * Albuterol use two time or less a week on average (not counting use with activity) * Cough interfering with sleep two time or less a month * Oral steroids no more than once a year * No hospitalizations  2. Chronic rhinitis - We are going to get some blood work to look for environmental allergens. - We will call you in 1-2 weeks with the results of the testing.  - In the meantime, stop the Singulair since you never felt that this helped. - Continue with the Zyrtec (cetirizine) for now. - We  may make additional changes once we get the testing results back.  3. Return in about 4 weeks (around 09/09/2020).  Subjective:   Troy Troy [name] is a 44 y.o. male presenting today for evaluation of  Chief Complaint  Patient presents with   Asthma    Troy Troy [name] has a history of the following: Patient Active Problem List   Diagnosis Date Noted   Appendicitis 03/25/2017   Stroke ([place]) 07/17/2015   Substance induced mood disorder ([place])    Cerebrovascular accident (CVA) ([place])     History obtained from: chart review and patient.  Troy Troy [name] was referred by Troy Rinne, MD.     Troy Troy [name] is a 44 y.o. male presenting for an evaluation of shortness of breath.   Asthma/Respiratory Symptom History: He follows with Troy Keeler PA. His last visit was in May 2021. At that time, he was continued on Breo one puff once daily as well as albuterol as needed. PA Troy Troy [name] works with Dr. Camillo Troy [name]. He was instructed to follow up annually. Troy [name] did not feel that PA Troy Troy [name] did much in the way of helping his asthma and even at one time, per the patient, joked by saying "just don't die on me!". Troy Troy [name], who clearly has a good sense of humor, was not amused.   He has had asthma for his entire life. He was called "Croupy" when he was a kid. He was diagnosed with EIB when he was a kid. He was exposed to cats when he was young and he thought this was  his largest trigger.   He had Hodgkin's lymphoma when he was 14 and he had high dose radiation to his. He did not have insurance for a while and did not do follow up visits. He had two strokes as well in 2016 which is thought to be from the radiation. Before he went into a gym, he had two stroke in the parking lot. 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 He gained a lot of weight during the stroke and rehab. He found a lesion on a bone (diagnosed due to severe stabbing knee pains in the right knee) and has  to have a Mohs procedure on a spot on his head. There is an MRI scheduled on Friday. Evidently, Troy Troy [name]'s father was exposed to [employer] which is thought to be [place] developed Hogdkin's lymphoma.   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   He will have episodes of throat swelling on the side of his road while he is biking. He has a rescue inhaler which does provide transient relief. He used it 7-8 times. It does take "some minutes" to feel better. He has felt his throat constricting in a way that this has never happened before.   He did have an [place] of 600 in March 2020. There has been no discussion of doing a biologic, although he does mention Nucala.    Allergic Rhinitis Symptom History: He does take Singulair and Allegra. He does not use a nose spray. He has never been allergy tested. He did take antihistamines today, as he was a last minute add on.   Otherwise, there is no history of other atopic diseases, including food allergies, drug allergies, stinging insect allergies, eczema, urticaria or contact dermatitis. There is no significant infectious history. Vaccinations are up to date.    Past Medical History: Patient Active Problem List   Diagnosis Date Noted   Appendicitis 03/25/2017   Stroke ([place]) 07/17/2015   Substance induced mood disorder ([place])    Cerebrovascular accident (CVA) ([place])     Medication List:  Allergies as of 08/12/2020      Reactions   Ciprofloxacin Other (See Comments)   Spontaneous rupture of tendon   Thelma Barge Officinalis] Anaphylaxis, Hives   Fluocinolone Other (See Comments)   "spontaneous rupture of tendons" per pt dr.      Medication List       Accurate as of August 12, 2020 11:06 PM. If you have any questions, ask your nurse or doctor.        STOP taking these medications   etodolac 500 MG tablet Commonly known as: LODINE Stopped by: Troy Shaggy, MD   pantoprazole 40 MG  tablet Commonly known as: PROTONIX Stopped by: Troy Shaggy, MD     TAKE these medications   albuterol (5 MG/ML) 0.5% nebulizer solution Commonly known as: PROVENTIL   albuterol 108 (90 Base) MCG/ACT inhaler Commonly known as: VENTOLIN HFA Inhale 2 puffs into the lungs every 6 (six) hours as needed. For shortness of breath.   ammonium lactate 12 % lotion Commonly known as: LAC-HYDRIN Apply 1 application topically as needed for dry skin.   aspirin 325 MG tablet Take 1 tablet (325 mg total) by mouth daily.   baclofen 10 MG tablet Commonly known as: LIORESAL Take 10 mg by mouth 3 (three) times daily.   esomeprazole 40 MG capsule Commonly known as: NEXIUM  Take 40 mg by mouth 2 (two) times daily before a meal.   fluticasone 50 MCG/ACT nasal spray Commonly known as: FLONASE Place into both nostrils daily.   fluticasone furoate-vilanterol 100-25 MCG/INH Aepb Commonly known as: BREO ELLIPTA Inhale into the lungs.   gabapentin 300 MG capsule Commonly known as: NEURONTIN TAKE 2 CAPSULES BY MOUTH EVERY MORNING, 2 CAPSULES BY MOUTH AT NOON, AND 4 CAPSULES BY MOUTH AT BEDTIME   hydrOXYzine 50 MG capsule Commonly known as: VISTARIL Take by mouth.   levothyroxine 112 MCG tablet Commonly known as: SYNTHROID Take 250 mcg by mouth daily.   lisinopril-hydrochlorothiazide 10-12.5 MG tablet Commonly known as: ZESTORETIC Take by mouth.   lisinopril-hydrochlorothiazide 20-25 MG tablet Commonly known as: ZESTORETIC Take 1 tablet by mouth daily.   metFORMIN 500 MG tablet Commonly known as: GLUCOPHAGE Take 500 mg by mouth daily with breakfast.   montelukast 10 MG tablet Commonly known as: SINGULAIR Take 10 mg by mouth at bedtime.   ondansetron 4 MG tablet Commonly known as: ZOFRAN Take 4 mg by mouth every 8 (eight) hours as needed for nausea or vomiting.   rosuvastatin 40 MG tablet Commonly known as: CRESTOR   testosterone cypionate 100 MG/ML injection Commonly  known as: DEPOTESTOTERONE CYPIONATE Inject 100 mg into the muscle every 14 (fourteen) days. For IM use only   traZODone 150 MG tablet Commonly known as: DESYREL Take by mouth at bedtime.   Trelegy Ellipta 100-62.5-25 MCG/INH Aepb Generic drug: Fluticasone-Umeclidin-Vilant Inhale 1 puff into the lungs daily. Started by: Troy Shaggy, MD   verapamil 180 MG 24 hr capsule Commonly known as: VERELAN PM Take 180 mg by mouth at bedtime.   zaleplon 10 MG capsule Commonly known as: SONATA Take 10 mg by mouth at bedtime as needed for sleep (takes 10-20 mg).       Birth History: non-contributory  Developmental History: non-contributory  Past Surgical History: Past Surgical History:  Procedure Laterality Date   LAPAROSCOPIC APPENDECTOMY N/A 03/25/2017   Procedure: APPENDECTOMY LAPAROSCOPIC; EXTENSIVE LYSIS OF ADHESIONS FROM PRIOR RADIATION AND SURGERY;  Surgeon: Johnathan Hausen, MD;  Location: [facility];  Service: General;  Laterality: N/A;   PARATHYROIDECTOMY     spleenectomy     TEE WITHOUT CARDIOVERSION N/A 07/18/2015   Procedure: TRANSESOPHAGEAL ECHOCARDIOGRAM (TEE);  Surgeon: Thayer Headings, MD;  [facility];  Service: Cardiovascular;  Laterality: N/A;   THYROIDECTOMY       Family History: Family History  Problem Relation Age of Onset   Stroke Maternal Grandmother      Social History: Jorell lives by himself in an apartment that is less than a year old. There is wood in the main living areas and carpeting in the bedrooms.  He has electric heating and central cooling.  There are no animals inside or outside of the home.  There are no dust mite covers on the bedding.  There is no tobacco exposure. He has a Therapist, nutritional in nursing, but has not practiced since he had some substance abuse issues.  The substance abuse that she has had unfortunately prevented him from getting additional employment.  He is now on disability due to his multiple  cancers.   Review of Systems  Constitutional: Negative for chills, fever, malaise/fatigue and weight loss.  HENT: Negative.  Negative for congestion, ear discharge, ear pain, sinus pain and sore throat.   Eyes: Negative for pain, discharge and redness.  Respiratory: Negative for cough, sputum production, shortness of breath and wheezing.  Cardiovascular: Negative.  Negative for chest pain and palpitations.  Gastrointestinal: Negative for abdominal pain, constipation, diarrhea, heartburn, nausea and vomiting.  Skin: Negative.  Negative for itching and rash.  Neurological: Negative for dizziness and headaches.  Endo/Heme/Allergies: Negative for environmental allergies. Does not bruise/bleed easily.       Objective:   Temperature 98.7 F (37.1 C), resp. rate 18, height 6' (1.829 m), weight 218 lb (98.9 kg), SpO2 98 %. Body mass index is 29.57 kg/m.   Physical Exam:   Physical Exam Constitutional:      Appearance: He is well-developed.     Comments: Talkative male.  Occasionally hard to get a word in edge wise.  Very amusing.  HENT:     Head: Normocephalic and atraumatic.     Right Ear: Tympanic membrane, ear canal and external ear normal. No drainage, swelling or tenderness. Tympanic membrane is not injected, scarred, erythematous, retracted or bulging.     Left Ear: Tympanic membrane, ear canal and external ear normal. No drainage, swelling or tenderness. Tympanic membrane is not injected, scarred, erythematous, retracted or bulging.     Nose: No nasal deformity, septal deviation, mucosal edema or rhinorrhea.     Right Turbinates: Enlarged and swollen.     Left Turbinates: Enlarged and swollen.     Right Sinus: No maxillary sinus tenderness or frontal sinus tenderness.     Left Sinus: No maxillary sinus tenderness or frontal sinus tenderness.     Comments: No polyps appreciated.    Mouth/Throat:     Mouth: Mucous membranes are not pale and not dry.     Pharynx: Uvula  midline.  Eyes:     General:        Right eye: No discharge.        Left eye: No discharge.     Conjunctiva/sclera: Conjunctivae normal.     Right eye: Right conjunctiva is not injected. No chemosis.    Left eye: Left conjunctiva is not injected. No chemosis.    Pupils: Pupils are equal, round, and reactive to light.  Cardiovascular:     Rate and Rhythm: Normal rate and regular rhythm.     Heart sounds: Normal heart sounds. No murmur heard.      Comments: Normal S1 and S2. Pulmonary:     Effort: Pulmonary effort is normal. No tachypnea, accessory muscle usage or respiratory distress.     Breath sounds: Normal breath sounds. No wheezing, rhonchi or rales.     Comments: Moving air well in all lung fields.  No increased work of breathing. Chest:     Chest wall: No tenderness.  Abdominal:     Tenderness: There is no abdominal tenderness. There is no guarding or rebound.  Lymphadenopathy:     Head:     Right side of head: No submandibular, tonsillar or occipital adenopathy.     Left side of head: No submandibular, tonsillar or occipital adenopathy.     Cervical: No cervical adenopathy.  Skin:    Coloration: Skin is not pale.     Findings: No abrasion, erythema, petechiae or rash. Rash is not papular, urticarial or vesicular.     Comments: No eczematous or urticarial lesions noted.  Neurological:     Mental Status: He is alert.      Diagnostic studies:    Spirometry: results normal (FEV1: 3.90/88%, FVC: 5.20/93%, FEV1/FVC: 75%).    Spirometry consistent with normal pattern. Xopenex four puffs via MDI treatment given in clinic with no improvement. However,  he did feel clinically improved.  Allergy Studies: Labs sent instead since he had taken antihistamines          Salvatore Marvel, MD Allergy and [place] [facility] [place]

## 2020-08-12 NOTE — Patient Instructions (Addendum)
1. Moderate persistent asthma, uncomplicated - Lung testing actually looked normal today, but it did improve with albuterol. - We going to get some labs to rule out serious causes of difficult to control asthma. - We are also going to see whether he would still qualify for one of her injectable medicines for asthma such as Nucala or Fasenra (I would prefer the Nucala since it has a longer safety track record, which is important given your complicated past medical history).  - Stop the Breo and start Trelegy 1 puff once daily. - Trelegy contains Breo +1 more medicine to help control asthma. - Daily controller medication(s): Trelegy 100/62.5/25 one puff once daily - Prior to physical activity: albuterol 2 puffs 10-15 minutes before physical activity. - Rescue medications: albuterol 4 puffs every 4-6 hours as needed - Asthma control goals:  * Full participation in all desired activities (may need albuterol before activity) * Albuterol use two time or less a week on average (not counting use with activity) * Cough interfering with sleep two time or less a month * Oral steroids no more than once a year * No hospitalizations  2. Chronic rhinitis - We are going to get some blood work to look for environmental allergens. - We will call you in 1-2 weeks with the results of the testing.  - In the meantime, stop the Singulair since you never felt that this helped. - Continue with the Zyrtec (cetirizine) for now. - We may make additional changes once we get the testing results back.  3. Return in about 4 weeks (around 09/09/2020).   Please inform us of any Emergency Department visits, hospitalizations, or changes in symptoms. Call us before going to the ED for breathing or allergy symptoms since we might be able to fit you in for a sick visit. Feel free to contact us anytime with any questions, problems, or concerns.  It was a pleasure to meet you today!  Websites that have reliable patient  information: 1. American Academy of Asthma, Allergy, and Immunology: www.aaaai.org 2. Food Allergy Research and Education (FARE): foodallergy.org 3. Mothers of Asthmatics: http://www.asthmacommunitynetwork.org 4. American College of Allergy, Asthma, and Immunology: www.acaai.org   COVID-19 Vaccine Information can be found at: ShippingScam.co.uk For questions related to vaccine distribution or appointments, please email [email or web address] or call [phone].     "Like" [place] on Facebook and Instagram for our latest updates!     HAPPY FALL!     Make sure you are registered to vote! If you have moved or changed any of your contact information, you will need to get this updated before voting!  In some cases, you MAY be able to register to vote online: [email or web address]

## 2020-08-13 ENCOUNTER — Encounter: Payer: Self-pay | Admitting: Allergy & Immunology

## 2020-08-13 DIAGNOSIS — Z859 Personal history of malignant neoplasm, unspecified: Secondary | ICD-10-CM | POA: Insufficient documentation

## 2020-08-13 DIAGNOSIS — J4540 Moderate persistent asthma, uncomplicated: Secondary | ICD-10-CM | POA: Insufficient documentation

## 2020-08-13 DIAGNOSIS — Z8571 Personal history of Hodgkin lymphoma: Secondary | ICD-10-CM | POA: Insufficient documentation

## 2020-08-13 DIAGNOSIS — J310 Chronic rhinitis: Secondary | ICD-10-CM | POA: Insufficient documentation

## 2020-08-13 DIAGNOSIS — Z560 Unemployment, unspecified: Secondary | ICD-10-CM | POA: Insufficient documentation

## 2020-08-13 LAB — CBC WITH DIFFERENTIAL/PLATELET
Basos: 1 %
Monocytes: 11 %

## 2020-08-13 LAB — IGE+ALLERGENS ZONE 2(30)

## 2020-08-14 LAB — ANCA TITERS: P-ANCA: <1:20 {titer}

## 2020-08-14 LAB — CBC WITH DIFFERENTIAL/PLATELET
MCHC: 34.8 g/dL (ref 31.5–35.7)
Neutrophils: 52 %
WBC: 10.3 10*3/uL (ref 3.4–10.8)

## 2020-08-14 LAB — IGE+ALLERGENS ZONE 2(30)

## 2020-08-15 LAB — IGE+ALLERGENS ZONE 2(30)
Alternaria Alternata IgE: 3.44 kU/L — AB
Amer Sycamore IgE Qn: <0.1 kU/L
Aspergillus Fumigatus IgE: <0.1 kU/L
Bahia Grass IgE: 0.96 kU/L — AB
Bermuda Grass IgE: <0.1 kU/L
Cat Dander IgE: 11.5 kU/L — AB
Cedar, Mountain IgE: <0.1 kU/L
Dog Dander IgE: 1.58 kU/L — AB
Elm, American IgE: <0.1 kU/L
Hickory, White IgE: 0.44 kU/L — AB
IgE (Immunoglobulin E), Serum: 74 IU/mL (ref 6–495)
Johnson Grass IgE: 0.49 kU/L — AB
Mucor Racemosus IgE: <0.1 kU/L
Mugwort IgE Qn: <0.1 kU/L
Nettle IgE: <0.1 kU/L
Oak, White IgE: <0.1 kU/L
Penicillium Chrysogen IgE: <0.1 kU/L
Pigweed, Rough IgE: <0.1 kU/L
Plantain, English IgE: <0.1 kU/L
Ragweed, Short IgE: 4.06 kU/L — AB
Sheep Sorrel IgE Qn: <0.1 kU/L
Stemphylium Herbarum IgE: 0.93 kU/L — AB
Sweet gum IgE RAST Ql: <0.1 kU/L
Timothy Grass IgE: 2.34 kU/L — AB
White Mulberry IgE: <0.1 kU/L

## 2020-08-15 LAB — CBC WITH DIFFERENTIAL/PLATELET
Basophils Absolute: 0.1 10*3/uL (ref 0.0–0.2)
EOS (ABSOLUTE): 0.5 10*3/uL — ABNORMAL HIGH (ref 0.0–0.4)
Eos: 4 %
Hematocrit: 43.1 % (ref 37.5–51.0)
Hemoglobin: 15 g/dL (ref 13.0–17.7)
Immature Grans (Abs): 0 10*3/uL (ref 0.0–0.1)
Immature Granulocytes: 0 %
Lymphocytes Absolute: 3.3 10*3/uL — ABNORMAL HIGH (ref 0.7–3.1)
Lymphs: 32 %
MCH: 33.3 pg — ABNORMAL HIGH (ref 26.6–33.0)
MCV: 96 fL (ref 79–97)
Monocytes Absolute: 1.2 10*3/uL — ABNORMAL HIGH (ref 0.1–0.9)
Neutrophils Absolute: 5.2 10*3/uL (ref 1.4–7.0)
Platelets: 255 10*3/uL (ref 150–450)
RBC: 4.51 x10E6/uL (ref 4.14–5.80)
RDW: 13.1 % (ref 11.6–15.4)

## 2020-08-15 LAB — ANCA TITERS
Atypical pANCA: <1:20 {titer}
C-ANCA: <1:20 {titer}

## 2020-08-15 LAB — ALPHA-1-ANTITRYPSIN: A-1 Antitrypsin: 141 mg/dL (ref 101–187)

## 2020-08-29 ENCOUNTER — Encounter: Payer: Self-pay | Admitting: Allergy & Immunology

## 2020-09-08 NOTE — Telephone Encounter (Signed)
Ok sounds good. I will route to the [employer] so that they can forward primary care doctor lists to him.  Salvatore Marvel, MD Allergy and [place] [facility] [place]

## 2020-09-16 ENCOUNTER — Other Ambulatory Visit: Payer: Self-pay

## 2020-09-16 ENCOUNTER — Encounter: Payer: Self-pay | Admitting: Allergy & Immunology

## 2020-09-16 ENCOUNTER — Ambulatory Visit (INDEPENDENT_AMBULATORY_CARE_PROVIDER_SITE_OTHER): Payer: Medicare Other | Admitting: Allergy & Immunology

## 2020-09-16 VITALS — BP 122/60 | HR 91 | Temp 98.7°F | Resp 20 | Ht 72.0 in | Wt 215.0 lb

## 2020-09-16 DIAGNOSIS — Z859 Personal history of malignant neoplasm, unspecified: Secondary | ICD-10-CM

## 2020-09-16 DIAGNOSIS — J4550 Severe persistent asthma, uncomplicated: Secondary | ICD-10-CM

## 2020-09-16 DIAGNOSIS — J3089 Other allergic rhinitis: Secondary | ICD-10-CM

## 2020-09-16 DIAGNOSIS — J4540 Moderate persistent asthma, uncomplicated: Secondary | ICD-10-CM

## 2020-09-16 DIAGNOSIS — J302 Other seasonal allergic rhinitis: Secondary | ICD-10-CM

## 2020-09-16 MED ORDER — AZELASTINE HCL 0.1 % NA SOLN: 1.0000 | Freq: Every day | NASAL | 5 refills | Status: DC

## 2020-09-16 MED ORDER — MEPOLIZUMAB 100 MG ~~LOC~~ SOLR
100.0000 mg | SUBCUTANEOUS | Status: DC
  Administered 2020-09-16 – 2021-01-21 (×5): 100 mg via SUBCUTANEOUS

## 2020-09-16 NOTE — Progress Notes (Signed)
Immunotherapy   Patient Details  Name: Troy [name] MRN: [id number] Date of Birth: [birth year]  09/16/2020  Troy [name] started injections for  Nucala given into his right arm. Patient tolerated injection well without any issues or complaints. Patient waited his 30 min's in the office.  Frequency: every 4 weeks Epi-Pen:Prescription for Epi-Pen given Consent signed and patient instructions given.   Festus Holts Paizleigh Wilds 09/16/2020, 6:09 PM

## 2020-09-16 NOTE — Patient Instructions (Addendum)
1. Moderate persistent asthma, uncomplicated - Lung testing actually looked good today. - Nucala given today in clinic.  - Daily controller medication(s): Trelegy 100/62.5/25 one puff once daily - Prior to physical activity: albuterol 2 puffs 10-15 minutes before physical activity. - Rescue medications: albuterol 4 puffs every 4-6 hours as needed - Asthma control goals:  * Full participation in all desired activities (may need albuterol before activity) * Albuterol use two time or less a week on average (not counting use with activity) * Cough interfering with sleep two time or less a month * Oral steroids no more than once a year * No hospitalizations  2. Chronic rhinitis (dust mites, cat, dog, grasses, molds, trees, and ragweed) - Continue with the Zyrtec (cetirizine) 10mg  daily.  - Continue with Flonase one spray per nostril daily. - Add on Astelin one spray per nostril daily.  3. Return in about 3 months (around 12/15/2020). Call Dr. Anitra Lauth in [place] [phone]) for a PCP.   Please inform us of any Emergency Department visits, hospitalizations, or changes in symptoms. Call us before going to the ED for breathing or allergy symptoms since we might be able to fit you in for a sick visit. Feel free to contact us anytime with any questions, problems, or concerns.  It was a pleasure to meet you today!  Websites that have reliable patient information: 1. American Academy of Asthma, Allergy, and Immunology: www.aaaai.org 2. Food Allergy Research and Education (FARE): foodallergy.org 3. Mothers of Asthmatics: http://www.asthmacommunitynetwork.org 4. American College of Allergy, Asthma, and Immunology: www.acaai.org   COVID-19 Vaccine Information can be found at: ShippingScam.co.uk For questions related to vaccine distribution or appointments, please email [email or web address] or call [phone].     "Like" [place] on  Facebook and Instagram for our latest updates!     HAPPY FALL!     Make sure you are registered to vote! If you have moved or changed any of your contact information, you will need to get this updated before voting!  In some cases, you MAY be able to register to vote online: [email or web address]

## 2020-09-16 NOTE — Progress Notes (Signed)
FOLLOW UP  Date of Service/Encounter:  09/16/20   Assessment:   Moderate persistent asthma - much better on Trelegy (received first mepolizumab today)  Elevated AEC in the past (600 in March 2020)  Chronic rhinitis  Complicated past medical history, including Hodgkin's lymphoma at age 44 and subsequent malignancies since that time  Plan/Recommendations:   1. Moderate persistent asthma, uncomplicated - Lung testing actually looked good today. - Nucala given today in clinic.  - Daily controller medication(s): Trelegy 100/62.5/25 one puff once daily - Prior to physical activity: albuterol 2 puffs 10-15 minutes before physical activity. - Rescue medications: albuterol 4 puffs every 4-6 hours as needed - Asthma control goals:  * Full participation in all desired activities (may need albuterol before activity) * Albuterol use two time or less a week on average (not counting use with activity) * Cough interfering with sleep two time or less a month * Oral steroids no more than once a year * No hospitalizations  2. Chronic rhinitis (dust mites, cat, dog, grasses, molds, trees, and ragweed) - Continue with the Zyrtec (cetirizine) 10mg  daily.  - Continue with Flonase one spray per nostril daily. - Add on Astelin one spray per nostril daily.  3. Return in about 3 months (around 12/15/2020). Call Dr. Anitra Lauth in [place] [phone]) for a PCP.  Subjective:   Troy [name] is a 44 y.o. male presenting today for follow up of  Chief Complaint  Patient presents with  . Asthma    Troy [name] has a history of the following: Patient Active Problem List   Diagnosis Date Noted  . Moderate persistent asthma, uncomplicated 96/22/2979  . History of cancer 08/13/2020  . Chronic rhinitis 08/13/2020  . Not currently working due to disabled status 08/13/2020  . Appendicitis 03/25/2017  . Stroke ([place]) 07/17/2015  . Substance induced mood disorder ([place])   . Cerebrovascular  accident (CVA) ([place])     History obtained from: chart review and patient.  Troy [name] is a 44 y.o. male presenting for a follow up visit.  He was last seen in November 2021 when he establish care with [place].  His lung testing actually looked quite good, but it did improve with the albuterol.  We did obtain labs to rule out serious causes of difficult to control asthma.  We also obtain labs to see if he would still qualify for an antieosinophil biologic.  We stopped his Breo and started Trelegy 1 puff once daily.  We continue with albuterol as needed.  For his rhinitis, we obtain blood work to look for environmental allergens.  We talked to the [place] since he never felt like this helped. We continued with the Zyrtec.  In the interim, he did have an elevated absolute eosinophil count.  We decided to avoid mepolizumab since that had the longer safety profile.  He presents today for his first dose and follow-up visit.  Asthma/Respiratory Symptom History: He does feel that the Trelegy is working well to control his symptoms. He has not been needing his rescue inhaler at all. [place]'s asthma has been well controlled. He has not required rescue medication, experienced nocturnal awakenings due to lower respiratory symptoms, nor have activities of daily living been limited. He has required no Emergency Department or Urgent Care visits for his asthma. He has required zero courses of systemic steroids for asthma exacerbations since the last visit. ACT score today is 12, indicating terrible asthma symptom control. However, his symptoms have improved since starting the  Trelegy.  He said he is able to tolerate longer bike rides.  Allergic Rhinitis Symptom History: He has tried the [place] and he has not noted any kind of difference. He remains on the cetirizine which is cheaper for him.  He does not feel like he needs allergy shots at this point, but he is not opposed to it.  He is still not gotten a primary  care provider.  He is willing to travel, but his current PCP is in [facility] he does not want to travel that far.  He is willing to go to [place] with his old primary care provider was there.  However, he has not really established rapport with this new provider.  His father is in the hospital, which has caused him some stress.  He apparently has bacteremia with gram-negative rods and copious diarrhea.  He seems to be making a come back.  His father usually does not have the serious infections.  Otherwise, there have been no changes to his past medical history, surgical history, family history, or social history.    Review of Systems  Constitutional: Negative.  Negative for fever, malaise/fatigue and weight loss.  HENT: Negative.  Negative for congestion, ear discharge and ear pain.   Eyes: Negative for pain, discharge and redness.  Respiratory: Positive for shortness of breath. Negative for cough, sputum production and wheezing.   Cardiovascular: Negative.  Negative for chest pain and palpitations.  Gastrointestinal: Negative for abdominal pain, constipation, diarrhea, heartburn, nausea and vomiting.  Skin: Negative.  Negative for itching and rash.  Neurological: Negative for dizziness and headaches.  Endo/Heme/Allergies: Negative for environmental allergies. Does not bruise/bleed easily.       Objective:   Blood pressure 122/60, pulse 91, temperature 98.7 F (37.1 C), temperature source Temporal, resp. rate 20, height 6' (1.829 m), weight 215 lb (97.5 kg), SpO2 96 %. Body mass index is 29.16 kg/m.   Physical Exam:  Physical Exam Constitutional:      Appearance: He is well-developed.     Comments: Pleasant male.  Talkative.  HENT:     Head: Normocephalic and atraumatic.     Right Ear: Tympanic membrane, ear canal and external ear normal.     Left Ear: Tympanic membrane, ear canal and external ear normal.     Nose: No nasal deformity, septal deviation, mucosal edema,  rhinorrhea or epistaxis.     Right Turbinates: Enlarged and swollen.     Left Turbinates: Enlarged and swollen.     Right Sinus: No maxillary sinus tenderness or frontal sinus tenderness.     Left Sinus: No maxillary sinus tenderness or frontal sinus tenderness.     Mouth/Throat:     Mouth: Oropharynx is clear and moist. Mucous membranes are not pale and not dry.     Pharynx: Uvula midline.  Eyes:     General:        Right eye: No discharge.        Left eye: No discharge.     Extraocular Movements: EOM normal.     Conjunctiva/sclera: Conjunctivae normal.     Right eye: Right conjunctiva is not injected. No chemosis.    Left eye: Left conjunctiva is not injected. No chemosis.    Pupils: Pupils are equal, round, and reactive to light.  Cardiovascular:     Rate and Rhythm: Normal rate and regular rhythm.     Heart sounds: Normal heart sounds.  Pulmonary:     Effort: Pulmonary effort  is normal. No tachypnea, accessory muscle usage or respiratory distress.     Breath sounds: Normal breath sounds. No wheezing, rhonchi or rales.     Comments: Faint wheezes at the bases, otherwise clear. Chest:     Chest wall: No tenderness.  Lymphadenopathy:     Cervical: No cervical adenopathy.  Skin:    Coloration: Skin is not pale.     Findings: No abrasion, erythema, petechiae or rash. Rash is not papular, urticarial or vesicular.     Comments: No eczematous or urticarial lesions noted.  Neurological:     Mental Status: He is alert.  Psychiatric:        Mood and Affect: Mood and affect normal.      Diagnostic studies: none      Salvatore Marvel, MD  [facility]

## 2020-09-18 ENCOUNTER — Telehealth: Payer: Self-pay | Admitting: Family Medicine

## 2020-09-18 ENCOUNTER — Encounter: Payer: Self-pay | Admitting: Allergy & Immunology

## 2020-09-18 NOTE — Telephone Encounter (Signed)
Patient scheduled new patient appointment on 10/20/20 with Dr. Anitra Lauth. Patient states he is a previous cancer and stroke survivor and has other complicated history. Would you like this appointment block to be 30 or 60 minutes?

## 2020-09-18 NOTE — Telephone Encounter (Signed)
Please advise on allotted time needed for this pt.

## 2020-09-18 NOTE — Telephone Encounter (Signed)
60 min please

## 2020-09-19 NOTE — Telephone Encounter (Signed)
Please allow 60 minute for patient.

## 2020-09-25 ENCOUNTER — Ambulatory Visit: Payer: Self-pay | Admitting: Allergy & Immunology

## 2020-10-14 ENCOUNTER — Ambulatory Visit (INDEPENDENT_AMBULATORY_CARE_PROVIDER_SITE_OTHER): Payer: Medicare Other

## 2020-10-14 ENCOUNTER — Other Ambulatory Visit: Payer: Self-pay

## 2020-10-14 DIAGNOSIS — J4550 Severe persistent asthma, uncomplicated: Secondary | ICD-10-CM

## 2020-10-14 DIAGNOSIS — J4540 Moderate persistent asthma, uncomplicated: Secondary | ICD-10-CM

## 2020-10-17 ENCOUNTER — Other Ambulatory Visit: Payer: Self-pay

## 2020-10-20 ENCOUNTER — Telehealth: Payer: Self-pay | Admitting: Family Medicine

## 2020-10-20 ENCOUNTER — Encounter: Payer: Self-pay | Admitting: Family Medicine

## 2020-10-20 ENCOUNTER — Ambulatory Visit: Payer: Medicare Other | Admitting: Family Medicine

## 2020-10-20 NOTE — Telephone Encounter (Signed)
FYI  Please see below

## 2020-10-20 NOTE — Telephone Encounter (Signed)
Spoke with patient regarding rescheduling his new patient appointment and he was very irate, rude and loud. Said he was referred to our office and was really "hoping this was the one, but I guess not." Stated he will run out of medications this week and asked what to do. Stated his previous doctor cut him off and refused to do any more refills. I suggested he go to urgent care for refills and he began yelling that he would not and that "everyone is being told not to go to those places because of Covid." Patient repeatedly said how unsatisfied he is with this situation and how unprofessional this is and that he needs his medications sooner than Friday. I told the patient if he is already unhappy with our office, perhaps he should seek care with a different office. He refused and said "Oh no, I am coming to that office." He wanted to talk with someone else and stated he did not want to speak to "just some secretary."  He repeatedly and forcefully said that he if he had cancelled within 24 hours that we would have "slapped me with a big ole bill." I assured patient that if he cancelled the day before, that would not be the case.  I suggested again that perhaps the patient should find a different PCP and he again forcefully stated that he wanted that appointment with [place]. Unfortunately, I had already offered a Friday appointment before he became overly irate. I tried to tell the patient that someone would be in touch regarding whether we would see him then or not and he was again loud and irate, asking why did it sound as if he might not have an appointment. I tried to tell him this case would need to be reviewed and he again reminded me how unsatisfied he is with our service, how unprofessional this is, he needs his medications refilled. He kept repeating these things and asking about the next appointment. I had patients in the office and told him I needed to attend to and that we would have to contact him back.  Patient then hung up the call.  Forwarding to office manager for review.

## 2020-10-21 NOTE — Telephone Encounter (Signed)
Needless to say, he should not be allowed in our office doors, no appointment, etc. Thank you for dealing with this difficult situation!!

## 2020-10-22 NOTE — Telephone Encounter (Signed)
Spoke with Patient on 1/11 to hear his account of the conversation.  Stated he was frustrated because he was very concerned that he was going to run out of medication before he could be seen. Did not feel like he came across rude at all.  Call him back on December 12 at 12:01 PM to confirm that his appointment for Friday the 14th had been canceled and that he would not be accepted as a new patient at this practice.

## 2020-10-24 ENCOUNTER — Ambulatory Visit: Payer: Medicare Other | Admitting: Family Medicine

## 2020-11-11 ENCOUNTER — Other Ambulatory Visit: Payer: Self-pay

## 2020-11-11 ENCOUNTER — Ambulatory Visit (INDEPENDENT_AMBULATORY_CARE_PROVIDER_SITE_OTHER): Payer: Medicare Other

## 2020-11-11 DIAGNOSIS — J4540 Moderate persistent asthma, uncomplicated: Secondary | ICD-10-CM

## 2020-11-11 DIAGNOSIS — J4550 Severe persistent asthma, uncomplicated: Secondary | ICD-10-CM | POA: Diagnosis not present

## 2020-11-17 ENCOUNTER — Telehealth: Payer: Self-pay

## 2020-11-17 NOTE — Telephone Encounter (Signed)
Pt is requesting a call back to make an appt but he has a few questions regarding  His health and if he will be able to EST care here .Marland KitchenMarland KitchenMarland Kitchen

## 2020-12-09 ENCOUNTER — Other Ambulatory Visit: Payer: Self-pay

## 2020-12-09 ENCOUNTER — Ambulatory Visit (INDEPENDENT_AMBULATORY_CARE_PROVIDER_SITE_OTHER): Payer: Medicare Other

## 2020-12-09 DIAGNOSIS — J4550 Severe persistent asthma, uncomplicated: Secondary | ICD-10-CM

## 2020-12-09 DIAGNOSIS — J4540 Moderate persistent asthma, uncomplicated: Secondary | ICD-10-CM

## 2020-12-16 ENCOUNTER — Ambulatory Visit (INDEPENDENT_AMBULATORY_CARE_PROVIDER_SITE_OTHER): Payer: Medicare Other | Admitting: Allergy & Immunology

## 2020-12-16 ENCOUNTER — Encounter: Payer: Self-pay | Admitting: Allergy & Immunology

## 2020-12-16 ENCOUNTER — Other Ambulatory Visit: Payer: Self-pay

## 2020-12-16 VITALS — BP 134/80 | HR 100 | Temp 98.3°F | Resp 18 | Ht 72.0 in | Wt 209.8 lb

## 2020-12-16 DIAGNOSIS — Z859 Personal history of malignant neoplasm, unspecified: Secondary | ICD-10-CM | POA: Diagnosis not present

## 2020-12-16 DIAGNOSIS — J3089 Other allergic rhinitis: Secondary | ICD-10-CM | POA: Diagnosis not present

## 2020-12-16 DIAGNOSIS — J4540 Moderate persistent asthma, uncomplicated: Secondary | ICD-10-CM | POA: Diagnosis not present

## 2020-12-16 DIAGNOSIS — J302 Other seasonal allergic rhinitis: Secondary | ICD-10-CM | POA: Diagnosis not present

## 2020-12-16 NOTE — Patient Instructions (Addendum)
1. Moderate persistent asthma, uncomplicated - Lung testing actually looked NORMAL today. - We will not make any changes today. - Daily controller medication(s): Trelegy 100/62.5/25 one puff once daily and Nucala monthly - Prior to physical activity: albuterol 2 puffs 10-15 minutes before physical activity. - Rescue medications: albuterol 4 puffs every 4-6 hours as needed - Asthma control goals:  * Full participation in all desired activities (may need albuterol before activity) * Albuterol use two time or less a week on average (not counting use with activity) * Cough interfering with sleep two time or less a month * Oral steroids no more than once a year * No hospitalizations  2. Chronic rhinitis (dust mites, cat, dog, grasses, molds, trees, and ragweed) - Continue with the Zyrtec (cetirizine) 10mg  daily.  - Continue with Flonase one spray per nostril daily. - Continue with Astelin one spray per nostril daily.  3. Return in about 6 months (around 06/18/2021).    Please inform us of any Emergency Department visits, hospitalizations, or changes in symptoms. Call us before going to the ED for breathing or allergy symptoms since we might be able to fit you in for a sick visit. Feel free to contact us anytime with any questions, problems, or concerns.  It was a pleasure to see you again today! Prayers for your mother's diagnosis...   Websites that have reliable patient information: 1. American Academy of Asthma, Allergy, and Immunology: www.aaaai.org 2. Food Allergy Research and Education (FARE): foodallergy.org 3. Mothers of Asthmatics: http://www.asthmacommunitynetwork.org 4. American College of Allergy, Asthma, and Immunology: www.acaai.org   COVID-19 Vaccine Information can be found at: ShippingScam.co.uk For questions related to vaccine distribution or appointments, please email [email or web address] or call [phone].    We realize that you might be concerned about having an allergic reaction to the COVID19 vaccines. To help with that concern, WE ARE OFFERING THE COVID19 VACCINES IN OUR OFFICE! Ask the front desk for dates!     "Like" [place] on Facebook and Instagram for our latest updates!      A healthy democracy works best when [employer] participate! Make sure you are registered to vote! If you have moved or changed any of your contact information, you will need to get this updated before voting!  In some cases, you MAY be able to register to vote online: [email or web address]
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
## 2020-12-18 ENCOUNTER — Telehealth: Payer: Self-pay

## 2020-12-18 NOTE — Telephone Encounter (Signed)
Called the Patient and he is coming tomorrow to sign paperwork and pick up new neb. machine from the [facility] office

## 2020-12-19 NOTE — Telephone Encounter (Signed)
Great! Thanks mucho!   Salvatore Marvel, MD [facility]

## 2020-12-23 NOTE — Telephone Encounter (Signed)
Called the patient to follow up on when he can pick up his new neb. Machine. I left a message for him to call us back. He didn't come last Friday

## 2020-12-25 ENCOUNTER — Telehealth: Payer: Self-pay

## 2020-12-25 NOTE — Telephone Encounter (Signed)
Called patient to see if he want to pick up Neb. Machine this week or next week. Was not able to reach him, but I did leave a message

## 2020-12-26 NOTE — Telephone Encounter (Signed)
Noted. Thanks!   Danilyn Cocke, MD [facility]  

## 2021-01-06 ENCOUNTER — Ambulatory Visit: Payer: Self-pay

## 2021-01-21 ENCOUNTER — Ambulatory Visit (INDEPENDENT_AMBULATORY_CARE_PROVIDER_SITE_OTHER): Payer: Medicare Other

## 2021-01-21 DIAGNOSIS — J4540 Moderate persistent asthma, uncomplicated: Secondary | ICD-10-CM

## 2021-02-02 ENCOUNTER — Ambulatory Visit (INDEPENDENT_AMBULATORY_CARE_PROVIDER_SITE_OTHER): Payer: Medicare Other | Admitting: Family Medicine

## 2021-02-02 ENCOUNTER — Other Ambulatory Visit: Payer: Self-pay

## 2021-02-02 ENCOUNTER — Encounter: Payer: Self-pay | Admitting: Family Medicine

## 2021-02-02 VITALS — BP 130/82 | HR 112 | Temp 98.7°F | Ht 72.0 in | Wt 202.8 lb

## 2021-02-02 DIAGNOSIS — Z8673 Personal history of transient ischemic attack (TIA), and cerebral infarction without residual deficits: Secondary | ICD-10-CM

## 2021-02-02 DIAGNOSIS — N529 Male erectile dysfunction, unspecified: Secondary | ICD-10-CM | POA: Insufficient documentation

## 2021-02-02 DIAGNOSIS — I10 Essential (primary) hypertension: Secondary | ICD-10-CM

## 2021-02-02 DIAGNOSIS — M545 Low back pain, unspecified: Secondary | ICD-10-CM

## 2021-02-02 DIAGNOSIS — E785 Hyperlipidemia, unspecified: Secondary | ICD-10-CM | POA: Diagnosis not present

## 2021-02-02 DIAGNOSIS — E890 Postprocedural hypothyroidism: Secondary | ICD-10-CM | POA: Diagnosis not present

## 2021-02-02 DIAGNOSIS — G629 Polyneuropathy, unspecified: Secondary | ICD-10-CM | POA: Insufficient documentation

## 2021-02-02 DIAGNOSIS — G63 Polyneuropathy in diseases classified elsewhere: Secondary | ICD-10-CM

## 2021-02-02 DIAGNOSIS — K219 Gastro-esophageal reflux disease without esophagitis: Secondary | ICD-10-CM

## 2021-02-02 DIAGNOSIS — E291 Testicular hypofunction: Secondary | ICD-10-CM

## 2021-02-02 DIAGNOSIS — Z9081 Acquired absence of spleen: Secondary | ICD-10-CM | POA: Insufficient documentation

## 2021-02-02 DIAGNOSIS — G8929 Other chronic pain: Secondary | ICD-10-CM

## 2021-02-02 DIAGNOSIS — J4540 Moderate persistent asthma, uncomplicated: Secondary | ICD-10-CM

## 2021-02-02 DIAGNOSIS — R7303 Prediabetes: Secondary | ICD-10-CM

## 2021-02-02 DIAGNOSIS — J310 Chronic rhinitis: Secondary | ICD-10-CM

## 2021-02-02 DIAGNOSIS — Z8619 Personal history of other infectious and parasitic diseases: Secondary | ICD-10-CM | POA: Insufficient documentation

## 2021-02-02 DIAGNOSIS — M549 Dorsalgia, unspecified: Secondary | ICD-10-CM | POA: Insufficient documentation

## 2021-02-02 DIAGNOSIS — J8410 Pulmonary fibrosis, unspecified: Secondary | ICD-10-CM | POA: Insufficient documentation

## 2021-02-02 DIAGNOSIS — F5104 Psychophysiologic insomnia: Secondary | ICD-10-CM

## 2021-02-02 MED ORDER — FLUTICASONE PROPIONATE 50 MCG/ACT NA SUSP
1.0000 | Freq: Every day | NASAL | 6 refills | Status: DC
Start: 2021-02-02 — End: 2022-03-08

## 2021-02-02 MED ORDER — VERAPAMIL HCL ER 180 MG PO CP24: 180.0000 mg | ORAL_CAPSULE | Freq: Every day | ORAL | 3 refills | Status: DC

## 2021-02-02 MED ORDER — TRAZODONE HCL 300 MG PO TABS: 300.0000 mg | ORAL_TABLET | Freq: Every day | ORAL | 3 refills | Status: DC

## 2021-02-02 MED ORDER — GABAPENTIN 300 MG PO CAPS: ORAL_CAPSULE | ORAL | 3 refills | Status: DC

## 2021-02-02 MED ORDER — HYDROXYZINE PAMOATE 50 MG PO CAPS: 50.0000 mg | ORAL_CAPSULE | Freq: Every day | ORAL | 3 refills | Status: DC

## 2021-02-02 MED ORDER — METFORMIN HCL 500 MG PO TABS: 500.0000 mg | ORAL_TABLET | Freq: Every day | ORAL | 3 refills | Status: DC

## 2021-02-02 MED ORDER — BACLOFEN 10 MG PO TABS: 10.0000 mg | ORAL_TABLET | Freq: Three times a day (TID) | ORAL | 3 refills | Status: DC

## 2021-02-02 MED ORDER — ROSUVASTATIN CALCIUM 40 MG PO TABS: 40.0000 mg | ORAL_TABLET | Freq: Every day | ORAL | 3 refills | Status: DC

## 2021-02-02 MED ORDER — ZALEPLON 10 MG PO CAPS: 10.0000 mg | ORAL_CAPSULE | Freq: Every evening | ORAL | 3 refills | Status: DC | PRN

## 2021-02-02 MED ORDER — LISINOPRIL 20 MG PO TABS: 1.0000 | ORAL_TABLET | Freq: Every day | ORAL | 3 refills | Status: DC

## 2021-02-02 MED ORDER — ESOMEPRAZOLE MAGNESIUM 40 MG PO CPDR: 40.0000 mg | DELAYED_RELEASE_CAPSULE | Freq: Two times a day (BID) | ORAL | 3 refills | Status: DC

## 2021-02-02 NOTE — Progress Notes (Signed)
[facility] [address] [place] [address] Dept: [phone] Dept Fax: [phone]  New Patient Office Visit  Subjective:    Patient ID: [name], male    DOB: 18-Apr-1976, 45 y.o..   MRN: [id number]  Chief Complaint  Patient presents with  . Establish Care    Np here for medication management.    History of Present Illness:  Patient is in today to establish care. Mr. Fringer is originally from [place]. He is single and has no children (unable to due to radiation therapy). He is currently disabled.  Mr. 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 He underwent splenectomy and full body radiation and has been in long-term remission. However, he has suffered several potential effects from this treatment.   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 He underwent thyroidectomy, so is currently hypothyroid and on replacement.  Mr. Castillo has had four different basal cell carcinomas removed. these have all been on the head/neck and upper body area.  Mr. Blonski has a history of hypogonadism and erectile dysfunction. He is currently treated with testosterone injections under the care of Dr. Patsi Sears (urology).  Mr. Noblett notes he has "fish scale skin disease" . He state she gets periodic flares, esp. over the knuckles of his hands. He is uning Lac-hydrin lotion when needed to clear this up.  Mr. Mazeika notes he has periodic spasms of muscles in his lower back. He was provided baclofen for this, but isn't sure this really helps him.  Mr. Molitoris has acid reflux, currently manage don Nexium. He finds this works well for him/  Mr. Clarida has a history of moderate persistent asthma. He is treated with Trelegy and abuterol. He is also receiving Nucala (mepolizumab) injections monthly through Dr. Dellis Anes, his allergy/asthma specialist. Mr. Bruer also notes he had findings of pulmonary fibrosis on a CT scan  of the chest after he had COVID-19. He admits his PFTs have not shown evidence of this.  Mr. Medine has a history of having had strokes involving both hemispheres. 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 The strokes occurred in 2016. At the time, he had fairly profound right hemiparesis, which has since resolved. He also had expressive aphasia, which has mostly improved. He has had a residual tremor of his hands. He also notes burning pain in his feet from a neuropathy related to the stroke. He uses gabapentin to control this.   Mr. Billy has chronic insomnia, mostly related to the prior strokes. He uses hydroxyzine, trazadone, and Zaleplon (Sonata) to help him sleep.  Mr. Angermeier is on lisinopril and verapamil for blood pressure control. He also take rosuvastatin for lipid management.  Past Medical History: Patient Active Problem List   Diagnosis Date Noted  . History of splenectomy 02/02/2021  . 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  . Erectile dysfunction 02/02/2021  . 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  . Pulmonary fibrosis (HCC) 02/02/2021  . Peripheral neuropathy 02/02/2021  . History of hepatitis C 02/02/2021  . Back pain 02/02/2021  . Hyperlipidemia 02/02/2021  . Moderate persistent asthma, uncomplicated 08/13/2020  . History of Hodgkin's lymphoma 08/13/2020  . Chronic rhinitis 08/13/2020  . Not currently working due to disabled status 08/13/2020  . Pain in joint of left knee 07/25/2020  . History of thyroid cancer 06/09/2020  . Arthralgia of left elbow 05/01/2020  . Autonomic dysfunction 04/23/2020  . Iron deficiency anemia due to chronic  blood loss 01/18/2020  . Microalbuminuria 01/18/2020  . Chronic insomnia 01/17/2020  . Large liver 01/17/2020  . Prediabetes 01/11/2019  . Daytime somnolence 07/22/2017  . History of basal cell carcinoma (BCC) 11/30/2016  . GERD (gastroesophageal reflux disease) 08/25/2016  . Essential hypertension 08/25/2016   . Substance induced mood disorder (HCC)   . History of stroke    Past Surgical History:  Procedure Laterality Date  . COLONOSCOPY  07/04/2019   done for hematochezia and FH col ca-mother: Mod int hem w/inflamm.  No adenomatous polyps.  [name] HERNIA REPAIR  06/2019   RUQ (prev lap append site)  . LAPAROSCOPIC APPENDECTOMY N/A 03/25/2017   Procedure: APPENDECTOMY LAPAROSCOPIC; EXTENSIVE LYSIS OF ADHESIONS FROM PRIOR RADIATION AND SURGERY;  Surgeon: Luretha Murphy, MD;  Location: [facility];  Service: General;  Laterality: N/A;  . PARATHYROIDECTOMY    . SPLENECTOMY    . TEE WITHOUT CARDIOVERSION N/A 07/18/2015   Procedure: TRANSESOPHAGEAL ECHOCARDIOGRAM (TEE);  Surgeon: [name], MD;  Location: [facility] ENDOSCOPY;  Service: Cardiovascular;  Laterality: N/A;  . THYROIDECTOMY     Family History  Problem Relation Age of Onset  . Stroke Maternal Grandmother    Outpatient Medications Prior to Visit  Medication Sig Dispense Refill  . albuterol (PROVENTIL HFA;VENTOLIN HFA) 108 (90 BASE) MCG/ACT inhaler Inhale 2 puffs into the lungs every 6 (six) hours as needed. For shortness of breath.    Marland Kitchen albuterol (PROVENTIL) (5 MG/ML) 0.5% nebulizer solution     . ammonium lactate (LAC-HYDRIN) 12 % lotion Apply 1 application topically as needed for dry skin.    Marland Kitchen aspirin 325 MG tablet Take 1 tablet (325 mg total) by mouth daily. 30 tablet 0  . EUTHYROX 125 MCG tablet Take 250 mcg by mouth daily.    . Fluticasone-Umeclidin-Vilant (TRELEGY ELLIPTA) 100-62.5-25 MCG/INH AEPB Inhale 1 puff into the lungs daily. 28 each 5  . LUER LOCK SAFETY SYRINGES 22G X 1-1/2" 3 ML MISC USE FOR SELF ADMINISTER OF TESTOSTERONE 2CC ONCE EVERY 2 WEEKS    . NUCALA 100 MG SOLR Inject into the skin.    Marland Kitchen testosterone cypionate (DEPOTESTOTERONE CYPIONATE) 100 MG/ML injection Inject 100 mg into the muscle every 14 (fourteen) days. For IM use only    . baclofen (LIORESAL) 10 MG tablet Take 10 mg by mouth 3 (three) times daily.  PRN.    Marland Kitchen esomeprazole (NEXIUM) 40 MG capsule Take 40 mg by mouth 2 (two) times daily before a meal.    . fluticasone (FLONASE) 50 MCG/ACT nasal spray Place into both nostrils daily.    . fluticasone furoate-vilanterol (BREO ELLIPTA) 100-25 MCG/INH AEPB Inhale into the lungs.    . gabapentin (NEURONTIN) 300 MG capsule TAKE 2 CAPSULES BY MOUTH EVERY MORNING, 2 CAPSULES BY MOUTH AT NOON, AND 4 CAPSULES BY MOUTH AT BEDTIME    . hydrOXYzine (VISTARIL) 50 MG capsule Take by mouth.    Marland Kitchen lisinopril (ZESTRIL) 20 MG tablet Take 1 tablet by mouth daily.    Marland Kitchen lisinopril-hydrochlorothiazide (PRINZIDE,ZESTORETIC) 20-25 MG tablet Take 1 tablet by mouth daily. 14 tablet 0  . metFORMIN (GLUCOPHAGE) 500 MG tablet Take 500 mg by mouth daily with breakfast.    . rosuvastatin (CRESTOR) 40 MG tablet   2  . trazodone (DESYREL) 300 MG tablet Take 300 mg by mouth at bedtime.    . verapamil (VERELAN PM) 180 MG 24 hr capsule Take 180 mg by mouth at bedtime.    . zaleplon (SONATA) 10 MG capsule Take 10  mg by mouth at bedtime as needed for sleep (takes 10-20 mg).    Marland Kitchen azelastine (ASTELIN) 0.1 % nasal spray Place 1 spray into both nostrils daily. Use in each nostril as directed 30 mL 5  . levothyroxine (SYNTHROID, LEVOTHROID) 112 MCG tablet Take 250 mcg by mouth daily.     Marland Kitchen lisinopril (ZESTRIL) 10 MG tablet Take 10 mg by mouth daily. (Patient not taking: Reported on 02/02/2021)    . lisinopril-hydrochlorothiazide (PRINZIDE,ZESTORETIC) 10-12.5 MG tablet Take by mouth.    . montelukast (SINGULAIR) 10 MG tablet Take 10 mg by mouth at bedtime.    . ondansetron (ZOFRAN) 4 MG tablet Take 4 mg by mouth every 8 (eight) hours as needed for nausea or vomiting.    . rosuvastatin (CRESTOR) 20 MG tablet Take 20 mg by mouth daily.    . traZODone (DESYREL) 150 MG tablet Take by mouth at bedtime.     Facility-Administered Medications Prior to Visit  Medication Dose Route Frequency Provider Last Rate Last Admin  . Mepolizumab SOLR 100 mg   100 mg Subcutaneous Q28 days Alfonse Spruce, MD   100 mg at 01/21/21 1603   Allergies  Allergen Reactions  . Ciprofloxacin Other (See Comments)    Spontaneous rupture of tendon  . Earnestine Leys Officinalis] Anaphylaxis and Hives  . Fluocinolone Other (See Comments)    "spontaneous rupture of tendons" per pt dr.    Dione Housekeeper:   Today's Vitals   02/02/21 1116  BP: (!) 182/82  Pulse: (!) 112  Temp: 98.7 F (37.1 C)  TempSrc: Temporal  SpO2: 97%  Weight: 202 lb 12.8 oz (92 kg)  Height: 6' (1.829 m)   Body mass index is 27.5 kg/m.   General: Well developed, well nourished. No acute distress. Lungs: Clear to auscultation bilaterally. No wheezing, rales or rhonchi. CV: RRR without murmurs or rubs. Pulses 2+ bilaterally. Neuro: Moderate fine tremor of the hands. Psych: Alert and oriented. Normal mood and affect.  Health Maintenance Due  Topic Date Due  . COVID-19 Vaccine (1) Never done     Assessment & Plan:   1. Essential hypertension Blood pressure at goal. I will rnew his meds. I recommend we see him back in 3 months for reassessment.  - lisinopril (ZESTRIL) 20 MG tablet; Take 1 tablet (20 mg total) by mouth daily.  Dispense: 90 tablet; Refill: 3 - verapamil (VERELAN PM) 180 MG 24 hr capsule; Take 1 capsule (180 mg total) by mouth at bedtime.  Dispense: 90 capsule; Refill: 3  2. History of stroke Prior CVA on aspirin, blood pressure meds, and lipid management.  3. Hyperlipidemia, unspecified hyperlipidemia type At goal in 2021. Plan fasting lipids at next visit.  - rosuvastatin (CRESTOR) 40 MG tablet; Take 1 tablet (40 mg total) by mouth daily.  Dispense: 90 tablet; Refill: 3  4. Postoperative hypothyroidism On Euthyrox. Followed by endocrinologist. Last TSH in Nov. was low. He did have his medications changed at this point. I will defer to endocrinology for management.  5. Male hypogonadism On testosterone injections and followed by urology.  6. Moderate  persistent asthma, uncomplicated Stable on Trelegy, albuterol, and Nucala.  7. Chronic insomnia Appears stable on current medication regimen. I will renew his meds.  - hydrOXYzine (VISTARIL) 50 MG capsule; Take 1 capsule (50 mg total) by mouth at bedtime.  Dispense: 90 capsule; Refill: 3 - trazodone (DESYREL) 300 MG tablet; Take 1 tablet (300 mg total) by mouth at bedtime.  Dispense: 90 tablet; Refill: 3 -  zaleplon (SONATA) 10 MG capsule; Take 1 capsule (10 mg total) by mouth at bedtime as needed for sleep (takes 10-20 mg).  Dispense: 120 capsule; Refill: 3  8. Prediabetes Agree with continued use of metformin. We will check an HbA1c at his next visit.  - metFORMIN (GLUCOPHAGE) 500 MG tablet; Take 1 tablet (500 mg total) by mouth daily with breakfast.  Dispense: 90 tablet; Refill: 3  9. Polyneuropathy associated with underlying disease (HCC) Stable on gabapentin.  - gabapentin (NEURONTIN) 300 MG capsule; Take 4 capsules by mouth every morning, 2 capsules by mouth at noon, and 2 capsules by mouth at bedtime.  Dispense: 240 capsule; Refill: 3  10. Chronic midline low back pain without sciatica We will cotninue intermittent use of baclofen for now.  - baclofen (LIORESAL) 10 MG tablet; Take 1 tablet (10 mg total) by mouth 3 (three) times daily. PRN.  Dispense: 30 each; Refill: 3  11. Gastroesophageal reflux disease without esophagitis Continue Nexium  - esomeprazole (NEXIUM) 40 MG capsule; Take 1 capsule (40 mg total) by mouth 2 (two) times daily before a meal.  Dispense: 180 capsule; Refill: 3  12. Chronic rhinitis Continue Flonase. - fluticasone (FLONASE) 50 MCG/ACT nasal spray; Place 1 spray into both nostrils daily.  Dispense: 9.9 mL; Refill: 6  Loyola Mast, MD

## 2021-02-03 ENCOUNTER — Encounter: Payer: Self-pay | Admitting: Family Medicine

## 2021-02-09 ENCOUNTER — Ambulatory Visit: Payer: Medicare Other | Admitting: Family Medicine

## 2021-02-10 NOTE — Telephone Encounter (Signed)
Hey it was 130/82

## 2021-02-16 ENCOUNTER — Other Ambulatory Visit: Payer: Self-pay | Admitting: Allergy & Immunology

## 2021-02-18 ENCOUNTER — Ambulatory Visit (INDEPENDENT_AMBULATORY_CARE_PROVIDER_SITE_OTHER): Payer: Medicare Other

## 2021-02-18 ENCOUNTER — Other Ambulatory Visit: Payer: Self-pay

## 2021-02-18 ENCOUNTER — Ambulatory Visit: Payer: Self-pay

## 2021-02-18 DIAGNOSIS — J4540 Moderate persistent asthma, uncomplicated: Secondary | ICD-10-CM | POA: Diagnosis not present

## 2021-02-18 MED ORDER — MEPOLIZUMAB 100 MG ~~LOC~~ SOLR
100.0000 mg | SUBCUTANEOUS | Status: DC
  Administered 2021-02-18 – 2021-07-08 (×6): 100 mg via SUBCUTANEOUS

## 2021-03-19 ENCOUNTER — Other Ambulatory Visit: Payer: Self-pay

## 2021-03-19 ENCOUNTER — Ambulatory Visit (INDEPENDENT_AMBULATORY_CARE_PROVIDER_SITE_OTHER): Payer: Medicare Other | Admitting: *Deleted

## 2021-03-19 DIAGNOSIS — J4550 Severe persistent asthma, uncomplicated: Secondary | ICD-10-CM | POA: Diagnosis not present

## 2021-03-19 DIAGNOSIS — J4540 Moderate persistent asthma, uncomplicated: Secondary | ICD-10-CM

## 2021-04-16 ENCOUNTER — Other Ambulatory Visit: Payer: Self-pay

## 2021-04-16 ENCOUNTER — Ambulatory Visit (INDEPENDENT_AMBULATORY_CARE_PROVIDER_SITE_OTHER): Payer: Medicare Other | Admitting: *Deleted

## 2021-04-16 DIAGNOSIS — J4550 Severe persistent asthma, uncomplicated: Secondary | ICD-10-CM

## 2021-04-16 DIAGNOSIS — J4540 Moderate persistent asthma, uncomplicated: Secondary | ICD-10-CM

## 2021-05-05 ENCOUNTER — Ambulatory Visit (INDEPENDENT_AMBULATORY_CARE_PROVIDER_SITE_OTHER): Payer: Medicare Other | Admitting: Family Medicine

## 2021-05-05 ENCOUNTER — Other Ambulatory Visit: Payer: Self-pay

## 2021-05-05 VITALS — BP 118/66 | HR 90 | Temp 98.1°F | Ht 72.0 in | Wt 208.0 lb

## 2021-05-05 DIAGNOSIS — D500 Iron deficiency anemia secondary to blood loss (chronic): Secondary | ICD-10-CM

## 2021-05-05 DIAGNOSIS — R7303 Prediabetes: Secondary | ICD-10-CM

## 2021-05-05 DIAGNOSIS — F5104 Psychophysiologic insomnia: Secondary | ICD-10-CM

## 2021-05-05 DIAGNOSIS — I10 Essential (primary) hypertension: Secondary | ICD-10-CM

## 2021-05-05 DIAGNOSIS — E785 Hyperlipidemia, unspecified: Secondary | ICD-10-CM

## 2021-05-05 DIAGNOSIS — Z7183 Encounter for nonprocreative genetic counseling: Secondary | ICD-10-CM

## 2021-05-05 DIAGNOSIS — E890 Postprocedural hypothyroidism: Secondary | ICD-10-CM

## 2021-05-05 LAB — LIPID PANEL
Cholesterol: 121 mg/dL (ref 0–200)
HDL: 42.9 mg/dL (ref >39.00)
LDL Cholesterol: 61 mg/dL (ref 0–99)
NonHDL: 78.52
Total CHOL/HDL Ratio: 3
Triglycerides: 86 mg/dL (ref 0.0–149.0)
VLDL: 17.2 mg/dL (ref 0.0–40.0)

## 2021-05-05 MED ORDER — TRAZODONE HCL 100 MG PO TABS: 100.0000 mg | ORAL_TABLET | Freq: Every day | ORAL | 3 refills | Status: DC

## 2021-05-05 NOTE — Progress Notes (Signed)
[facility] [address] [place] [address] Dept: [phone] Dept Fax: [phone]  Office Visit  Subjective:    Patient ID: Troy [name], male    DOB: [birth year], 45 y.o..   MRN: [id number]  Chief Complaint  Patient presents with   Follow-up    3 month f/u HTN/chol.  Not fasting today.   Wants to know if he needs to keep taking Metformin and wants Trazodone dose raised. Also wants to know if he can be tested for NTHL1.      History of Present Illness:  Patient is in today for reassessment.  Mr. Collingsworth has a history of hypertension. He is managed on verapamil and lisinopril. He is pleased to see his BP looking so good today.  Mr. Santora has a past history of prediabetes. He notes that since his diagnosis, he has lost 50 lbs. He has been on metformin for some time now. He wonders about whether he needs to continue this. He had a HbA1c performed yesterday through his endocrinologist.  Mr. Mcglynn notes that he has a history of periodic iron deficiency anemia. This is due to a bleeding internal hemorrhoid. He has had prior colonoscopies and did not have other sources of GI blood loss. He had a CBC performed by his endocrinologist yesterday.   Mr. William has a past history of thyroid cancer. He is currently hypothyroid s/p thyroid resection. He had his TSH checked yesterday. His thyroid medication is managed by his endocrinologist.  Mr. Tetterton has a long-standing history of insomnia. He uses hydroxyzine, trazadone, and Zaleplon (Sonata) to help him sleep. He recently had an outbreak of poison ivy, which was also interfering with sleep. He had previously been on 500 mg qhs of trazodone. He temporarily increased his dose up from his current 300 mg and feels it did improve his sleep. He would like to consider making the increase permanent.  Mr. 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 He notes that his mother is  positive for the gene and his sister is a carrier. Mr. Bevil has a history of thyroid cancer, Hodgkin's lymphoma, and several basal cell carcinomas. He was found to have intestinal polyps before age 25. He wonders about having testing for NTHL-1.   Past Medical History: Patient Active Problem List   Diagnosis Date Noted   History of splenectomy 02/02/2021   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   Erectile dysfunction 02/02/2021   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   Pulmonary fibrosis (HCC) 02/02/2021   Peripheral neuropathy 02/02/2021   History of hepatitis C 02/02/2021   Back pain 02/02/2021   Hyperlipidemia 02/02/2021   Moderate persistent asthma, uncomplicated 08/13/2020   History of Hodgkin's lymphoma 08/13/2020   Chronic rhinitis 08/13/2020   Not currently working due to disabled status 08/13/2020   Pain in joint of left knee 07/25/2020   History of thyroid cancer 06/09/2020   Arthralgia of left elbow 05/01/2020   Autonomic dysfunction 04/23/2020   Iron deficiency anemia due to chronic blood loss 01/18/2020   Microalbuminuria 01/18/2020   Chronic insomnia 01/17/2020   Large liver 01/17/2020   Prediabetes 01/11/2019   Daytime somnolence 07/22/2017   History of basal cell carcinoma (BCC) 11/30/2016   GERD (gastroesophageal reflux disease) 08/25/2016   Essential hypertension 08/25/2016   Substance induced mood disorder (HCC)    History of stroke     Past Surgical History:  Procedure Laterality Date   COLONOSCOPY  07/04/2019   done for hematochezia  and FH col ca-mother: Mod int hem w/inflamm.  No adenomatous polyps.   INCISIONAL HERNIA REPAIR  06/2019   RUQ (prev lap append site)   LAPAROSCOPIC APPENDECTOMY N/A 03/25/2017   Procedure: APPENDECTOMY LAPAROSCOPIC; EXTENSIVE LYSIS OF ADHESIONS FROM PRIOR RADIATION AND SURGERY;  Surgeon: Luretha Murphy, MD;  Location: [facility];  Service: General;  Laterality: N/A;   PARATHYROIDECTOMY     SPLENECTOMY     TEE WITHOUT CARDIOVERSION  N/A 07/18/2015   Procedure: TRANSESOPHAGEAL ECHOCARDIOGRAM (TEE);  Surgeon: [name], MD;  Location: [facility] ENDOSCOPY;  Service: Cardiovascular;  Laterality: N/A;   THYROIDECTOMY      Family History  Problem Relation Age of Onset   Stroke Maternal Grandmother     Outpatient Medications Prior to Visit  Medication Sig Dispense Refill   albuterol (PROVENTIL HFA;VENTOLIN HFA) 108 (90 BASE) MCG/ACT inhaler Inhale 2 puffs into the lungs every 6 (six) hours as needed. For shortness of breath.     albuterol (PROVENTIL) (5 MG/ML) 0.5% nebulizer solution      ammonium lactate (LAC-HYDRIN) 12 % lotion Apply 1 application topically as needed for dry skin.     aspirin 325 MG tablet Take 1 tablet (325 mg total) by mouth daily. 30 tablet 0   azelastine (ASTELIN) 0.1 % nasal spray Place 1 spray into both nostrils daily. Use in each nostril as directed 30 mL 5   baclofen (LIORESAL) 10 MG tablet Take 1 tablet (10 mg total) by mouth 3 (three) times daily. PRN. 30 each 3   esomeprazole (NEXIUM) 40 MG capsule Take 1 capsule (40 mg total) by mouth 2 (two) times daily before a meal. 180 capsule 3   fluticasone (FLONASE) 50 MCG/ACT nasal spray Place 1 spray into both nostrils daily. 9.9 mL 6   gabapentin (NEURONTIN) 300 MG capsule Take 4 capsules by mouth every morning, 2 capsules by mouth at noon, and 2 capsules by mouth at bedtime. 240 capsule 3   hydrOXYzine (VISTARIL) 50 MG capsule Take 1 capsule (50 mg total) by mouth at bedtime. 90 capsule 3   lisinopril (ZESTRIL) 20 MG tablet Take 1 tablet (20 mg total) by mouth daily. 90 tablet 3   LUER LOCK SAFETY SYRINGES 22G X 1-1/2" 3 ML MISC USE FOR SELF ADMINISTER OF TESTOSTERONE 2CC ONCE EVERY 2 WEEKS     NUCALA 100 MG SOLR Inject into the skin.     rosuvastatin (CRESTOR) 40 MG tablet Take 1 tablet (40 mg total) by mouth daily. 90 tablet 3   testosterone cypionate (DEPOTESTOTERONE CYPIONATE) 100 MG/ML injection Inject 100 mg into the muscle every 14 (fourteen)  days. For IM use only     trazodone (DESYREL) 300 MG tablet Take 1 tablet (300 mg total) by mouth at bedtime. 90 tablet 3   TRELEGY ELLIPTA 100-62.5-25 MCG/INH AEPB INHALE 1 PUFF INTO THE LUNGS DAILY 60 each 4   verapamil (VERELAN PM) 180 MG 24 hr capsule Take 1 capsule (180 mg total) by mouth at bedtime. 90 capsule 3   zaleplon (SONATA) 10 MG capsule Take 1 capsule (10 mg total) by mouth at bedtime as needed for sleep (takes 10-20 mg). 120 capsule 3   EUTHYROX 125 MCG tablet Take 250 mcg by mouth daily.     metFORMIN (GLUCOPHAGE) 500 MG tablet Take 1 tablet (500 mg total) by mouth daily with breakfast. 90 tablet 3   EUTHYROX 200 MCG tablet Take 200 mcg by mouth daily.     Facility-Administered Medications Prior to Visit  Medication  Dose Route Frequency Provider Last Rate Last Admin   Mepolizumab SOLR 100 mg  100 mg Subcutaneous Q28 days Alfonse Spruce, MD   100 mg at 04/16/21 1056    Allergies  Allergen Reactions   Ciprofloxacin Other (See Comments)    Spontaneous rupture of tendon   Earnestine Leys Officinalis] Anaphylaxis and Hives   Fluocinolone Other (See Comments)    "spontaneous rupture of tendons" per pt dr.      Dione Housekeeper:   Today's Vitals   05/05/21 1056  BP: 118/66  Pulse: 90  Temp: 98.1 F (36.7 C)  SpO2: 97%  Weight: 208 lb (94.3 kg)  Height: 6' (1.829 m)   Body mass index is 28.21 kg/m.   General: Well developed, well nourished. No acute distress. HEENT: Normocephalic, non-traumatic. PERRL, EOMI. Conjunctiva clear. Fundiscopic exam shows normal disc and vasculature. External ears normal. EAC and TMs normal bilaterally. Nose    clear without congestion or rhinorrhea. Mucous membranes moist. Oropharynx clear. Good dentition. Neck: Supple. No lymphadenopathy. No thyromegaly. Lungs: Clear to auscultation bilaterally. No wheezing, rales or rhonchi. CV: RRR without murmurs or rubs. Pulses 2+ bilaterally. Abdomen: Soft, non-tender. Bowel sounds positive, normal  pitch and frequency. No hepatosplenomegaly. No rebound or guarding. Back: Straight. No CVA tenderness bilaterally. Extremities: Full ROM. No joint swelling or tenderness. No edema noted. Skin: Warm and dry. No rashes. Neuro:CN II-XII intact. Normal sensation and DTR bilaterally. Psych: Alert and oriented x3. Normal mood and affect.  Health Maintenance Due  Topic Date Due   COVID-19 Vaccine (1) Never done   Pneumococcal Vaccine 18-52 Years old (2 - PPSV23 or PCV20) 07/07/2021    Lab Results   Ref Range & Units 1 d ago  HEMOGLOBIN A1C <5.8 % 5.6     Ref Range & Units 1 d ago  WBC 4.4 - 11.0 x 10*3/uL 9.7   RBC 4.50 - 5.90 x 10*6/uL 4.85   Hemoglobin 14.0 - 17.5 G/DL 54.0 Low    Hematocrit 41.5 - 50.4 % 38.9 Low    MCV 80.0 - 96.0 FL 80.1   MCH 27.5 - 33.2 PG 25.3 Low    MCHC 33.0 - 37.0 G/DL 98.1 Low    RDW 19.1 - 17.0 % 18.8 High    Platelets 150 - 450 X 10*3/uL 281   MPV 6.8 - 10.2 FL 10.9 High         Ref Range & Units 1 d ago  TSH 0.45 - 5.00 UIU/ML 19.07 High     Assessment & Plan:   1. Prediabetes It seems reasonable to give Mr. Parekh a trial off of his metformin to see if he still needs this medication. His weight loss and increased physical activity may have resolved the issue. We will stop the metformin and plan to reassess his HbA1c in 3 months.  2. Essential hypertension Blood pressure is at goal. We will continue verapamil and lisinopril.  3. Postoperative hypothyroidism Mr. Redder TSH has increased significantly since his last check. He does note his endocrinologist may a small adjustment in his Euthyrox last winter. He will reach back to Dr. Kathreen Cosier concerning this result.  4. Chronic insomnia We discussed potentially making a smaller adjustment up on his trazodone. He will increase to 400 mg total at bedtime (will take both a 300 mg and 100 mg tablet).  - traZODone (DESYREL) 100 MG tablet; Take 1 tablet (100 mg total) by mouth at bedtime. Take together with  trazodone 300 mg at bedtime (total  of 400 mg daily)  Dispense: 90 tablet; Refill: 3  5. Iron deficiency anemia due to chronic blood loss Mr. Bill has had a return of a microcytic anemia. I will check an iron panel, but suspect he will need iron replacement. He has had previous scopes and a known internal hemorrhoid that periodically bleeds.  - Iron, TIBC and Ferritin Panel  6. Encounter for nonprocreative genetic counseling Mr. 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 His mother is apparently heterozygous for the gene and his sister is a carrier (homozygous) for this autosomal recessive gene. He feels that knowing that he has this will help to put his mind at ease about his various cancers. I will refer him to genetics to consider testing.  - Ambulatory referral to Genetics  7. Hyperlipidemia, unspecified hyperlipidemia type Dure for follow-up lipids.  - Lipid panel  Loyola Mast, MD

## 2021-05-06 ENCOUNTER — Telehealth: Payer: Self-pay | Admitting: Genetic Counselor

## 2021-05-06 LAB — IRON,TIBC AND FERRITIN PANEL
%SAT: 10 % (calc) — ABNORMAL LOW (ref 20–48)
Ferritin: 8 ng/mL — ABNORMAL LOW (ref 38–380)
Iron: 41 ug/dL — ABNORMAL LOW (ref 50–180)
TIBC: 417 mcg/dL (calc) (ref 250–425)

## 2021-05-06 MED ORDER — FERROUS SULFATE 325 (65 FE) MG PO TABS: 325.0000 mg | ORAL_TABLET | Freq: Two times a day (BID) | ORAL | 1 refills | Status: DC

## 2021-05-06 NOTE — Telephone Encounter (Signed)
Received a genetic counseling referral from Dr. Gena Fray. Mr. Macquarrie returned my call and has been scheduled to see Cari on 8/8 at 10am. Pt aware to arrive 15 minutes early.

## 2021-05-06 NOTE — Addendum Note (Signed)
Addended by: Haydee Salter on: 05/06/2021 08:28 AM   Modules accepted: Orders

## 2021-05-13 ENCOUNTER — Ambulatory Visit (INDEPENDENT_AMBULATORY_CARE_PROVIDER_SITE_OTHER): Payer: Medicare Other | Admitting: *Deleted

## 2021-05-13 DIAGNOSIS — J4550 Severe persistent asthma, uncomplicated: Secondary | ICD-10-CM | POA: Diagnosis not present

## 2021-05-18 ENCOUNTER — Inpatient Hospital Stay: Payer: Medicare Other | Attending: Hematology | Admitting: Genetic Counselor

## 2021-05-18 ENCOUNTER — Inpatient Hospital Stay: Payer: Medicare Other

## 2021-05-18 ENCOUNTER — Other Ambulatory Visit: Payer: Self-pay

## 2021-05-18 DIAGNOSIS — Z803 Family history of malignant neoplasm of breast: Secondary | ICD-10-CM

## 2021-05-18 DIAGNOSIS — Z85828 Personal history of other malignant neoplasm of skin: Secondary | ICD-10-CM | POA: Diagnosis not present

## 2021-05-18 DIAGNOSIS — Z8571 Personal history of Hodgkin lymphoma: Secondary | ICD-10-CM | POA: Diagnosis not present

## 2021-05-18 DIAGNOSIS — Z85850 Personal history of malignant neoplasm of thyroid: Secondary | ICD-10-CM | POA: Diagnosis not present

## 2021-05-18 DIAGNOSIS — Z8481 Family history of carrier of genetic disease: Secondary | ICD-10-CM

## 2021-05-18 LAB — GENETIC SCREENING ORDER

## 2021-05-19 ENCOUNTER — Encounter: Payer: Self-pay | Admitting: Genetic Counselor

## 2021-05-19 ENCOUNTER — Telehealth: Payer: Self-pay | Admitting: Allergy & Immunology

## 2021-05-19 DIAGNOSIS — Z803 Family history of malignant neoplasm of breast: Secondary | ICD-10-CM

## 2021-05-19 HISTORY — DX: Family history of malignant neoplasm of breast: Z80.3

## 2021-05-19 MED ORDER — TRELEGY ELLIPTA 100-62.5-25 MCG/INH IN AEPB: 1.0000 | INHALATION_SPRAY | Freq: Every day | RESPIRATORY_TRACT | 4 refills | Status: DC

## 2021-05-19 NOTE — Telephone Encounter (Signed)
Patient called and states that Troy [name] seems to have lost his trelegy prescription. He received a text that the medication was ready to pickup, but when he went they did not have it. Patient would like the prescription resent, but would like to know if he can get any samples since he took his last dose this morning.  Please advise.

## 2021-05-19 NOTE — Progress Notes (Signed)
REFERRING PROVIDER: Haydee Salter, [place],  [place] [address]  PRIMARY PROVIDER:  Haydee Salter, MD  PRIMARY REASON FOR VISIT:  1. Family history of gene mutation   2. History of basal cell carcinoma (BCC)   3. History of Hodgkin's lymphoma   4. History of thyroid cancer   5. Family history of breast cancer     HISTORY OF PRESENT ILLNESS:   Mr. Nehme, a 45 y.o. male, was seen for a [place] cancer genetics consultation at the request of Dr. Gena Fray due to a family history of an [place] mutation.  Mr. Womac presents to clinic today to discuss the possibility of a hereditary predisposition to cancer, to discuss genetic testing, and to further clarify his future cancer risks, as well as potential cancer risks for family members.   At the age of 22, Mr. Haskin was diagnosed with Hodgkin lymphoma. The treatment plan included splenectomy and radiation.  Around the age of 51, Mr. Hoey was diagnosed with papillary thyroid cancer s/p resection.  He also has a personal history of pulmonary fibrosis and has had several basal cell carcinomas removed from his head, neck, and back after the age of 38.     RISK FACTORS:  Colonoscopy: yes; most recent 4 years ago ago; polyps detected  Past Medical History:  Diagnosis Date   Bipolar 1 disorder ([place])    Chronic fatigue    Drug abuse ([place])    Family history of breast cancer 05/19/2021   Family history of colon cancer in mother    GERD (gastroesophageal reflux disease)    +Barretts   Hematochezia    hemorrhoids   Hepatic steatosis    +hepatomegaly   History of CVA (cerebrovascular accident)    radiation-induced carotid damage/changes   History of hepatitis C    +treated   History of Hodgkin's lymphoma    age 45->mantle RT + splenectomy   History of splenectomy    Hyperlipidemia    Hypertension    Insomnia    Male hypogonadism    Moderate persistent asthma    Obesity, Class I, BMI 30-34.9    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    Past Surgical History:  Procedure Laterality Date   COLONOSCOPY  07/04/2019   done for hematochezia and FH col ca-mother: Mod int hem w/inflamm.  No adenomatous polyps.   INCISIONAL HERNIA REPAIR  06/2019   RUQ (prev lap append site)   LAPAROSCOPIC APPENDECTOMY N/A 03/25/2017   Procedure: APPENDECTOMY LAPAROSCOPIC; EXTENSIVE LYSIS OF ADHESIONS FROM PRIOR RADIATION AND SURGERY;  Surgeon: Johnathan Hausen, MD;  Location: [facility];  Service: General;  Laterality: N/A;   PARATHYROIDECTOMY     SPLENECTOMY     TEE WITHOUT CARDIOVERSION N/A 07/18/2015   Procedure: TRANSESOPHAGEAL ECHOCARDIOGRAM (TEE);  Surgeon: Thayer Headings, MD;  Location: [facility] ENDOSCOPY;  Service: Cardiovascular;  Laterality: N/A;   THYROIDECTOMY      Social History   Socioeconomic History   Marital status: Single    Spouse name: Not on file   Number of children: Not on file   Years of education: Not on file   Highest education level: Not on file  Occupational History   Not on file  Tobacco Use   Smoking status: Former    Types: Cigarettes   Smokeless tobacco: Never  Vaping Use   Vaping Use: Never used  Substance and Sexual Activity   Alcohol use: Yes    Comment: occasionally   Drug use: Yes    Types: Marijuana   Sexual activity: Not on file  Other Topics Concern   Not on file  Social History Narrative   Not on file   Social Determinants of Health   Financial Resource Strain: Not on file  Food Insecurity: Not on file  Transportation Needs: Not on file  Physical Activity: Not on file  Stress: Not on file  Social Connections: Not on file     FAMILY HISTORY:  We obtained a detailed, 4-generation family history.  Significant diagnoses are listed below: Family History  Problem Relation Age of Onset   Breast cancer Mother        dx early 80s   Colon  cancer Mother        dx early 74s   Other Mother        NTHL1 homozygous   Multiple myeloma Father        dx 58s   Breast cancer Maternal Aunt    Stroke Maternal Grandmother    Leukemia Other        dx <20; two paternal first cousins once removed    Mr. Mucha is unaware of previous family history of genetic testing for hereditary cancer risks besides that mentioned above.  His mother is homozygous for an [place] mutation called c.268C>T (p.Gln90*). Patient's maternal ancestors are of [place] descent, and paternal ancestors are of [place] and [place] descent. There is no reported Ashkenazi Jewish ancestry. There is no known consanguinity.  GENETIC COUNSELING ASSESSMENT: Mr. Gauthier is a 45 y.o. male with a family history of a known hereditary cancer syndrome and a family history of cancer which is somewhat suggestive of a predisposition to cancer.  We, therefore, discussed and recommended the following at today's visit.   DISCUSSION: We discussed that 5 - 10% of cancer is hereditary.  We reviewed that [place] tumor predisposition syndrome is an autosomal recessive condition.  Given that his mother is homozygous for a mutation in [facility], he is an obligate carrier for [facility].  Given his family history, we reviewed that there are several genes associated with hereditary breast cancer, including but not limited to BRCA1/2. We discussed that most lymphoma and hematologic malignancies are not hereditary; however, given his personal history of lymphoma and the presence of hematologic malignancies in multiple generations of his family, it is not unreasonable to have genetic testing for hereditary genes associated with these cancers.  We discussed that testing is beneficial for several reasons including knowing how to follow individuals for their cancer risks and and understanding if other family members could be at risk for cancer and allowing them to undergo genetic testing.   We reviewed the characteristics,  features and inheritance patterns of hereditary cancer syndromes. We also discussed genetic testing, including the appropriate family members to test, the process of testing, insurance coverage and turn-around-time for results. We discussed the implications of a negative, positive, carrier and/or variant of uncertain significant result. We recommended Mr. Uher pursue genetic testing for a panel that includes NTHL1 and genes associated with breast and hematologic cancers.   The Multi-Cancer Panel offered by [facility] includes sequencing and/or deletion duplication testing of the following 84 genes: AIP, ALK, APC, ATM, AXIN2,BAP1,  BARD1, BLM, BMPR1A, BRCA1, BRCA2, BRIP1, CASR, CDC73, CDH1, CDK4, CDKN1B, CDKN1C, CDKN2A (p14ARF), CDKN2A (p16INK4a), CEBPA, CHEK2, CTNNA1, DICER1, DIS3L2,  EGFR (c.2369C>T, p.Thr790Met variant only), EPCAM (Deletion/duplication testing only), FH, FLCN, GATA2, GPC3, GREM1 (Promoter region deletion/duplication testing only), HOXB13 (c.251G>A, p.Gly84Glu), HRAS, KIT, MAX, MEN1, MET, MITF (c.952G>A, p.Glu318Lys variant only), MLH1, MSH2, MSH3, MSH6, MUTYH, NBN, NF1, NF2, NTHL1, PALB2, PDGFRA, PHOX2B, PMS2, POLD1, POLE, POT1, PRKAR1A, PTCH1, PTEN, RAD50, RAD51C, RAD51D, RB1, RECQL4, RET, RUNX1, SDHAF2, SDHA (sequence changes only), SDHB, SDHC, SDHD, SMAD4, SMARCA4, SMARCB1, SMARCE1, STK11, SUFU, TERC, TERT, TMEM127, TP53, TSC1, TSC2, VHL, WRN and WT1.   The [facility] Hereditary Lymphoma panel includes sequencing and/or deletion duplication testing of the following 43 genes: ADA, ATM, BLM, CARD11, CARMIL2, CASP8, CD27, CTLA4, CTPS1, DOCK8, EPCAM, FADD, FAS, FASLG, FCHO1, IKZF1, IL2RA, IL2RB, ITK, MAGT1, MCM4, MLH1, MSH2, MSH6, NBN, NF1, PIK3CD, PIK3R1, PMS2, PRKCD, RAC2, RASGRP1, RHOH, RMRP, SH2D1A, STAT3, STK4, STXBP2, TNFRSF13B, TP53, TPP2, WAS, XIAP.  The [facility] Hereditary Myelodysplastic Syndrome/Leukemia Panel includes sequencing and/or deletion duplication testing of the following 32  genes: ANKRD26, ATM, BLM, CBL, CEBPA, DDX41, ELANE, EPCAM, ERCC6L2, ETV6, G6PC3, GATA2, GFI1, HAX1, IKZF1, KRAS, MECOM, MLH1, MSH2, MSH6, NBN, NF1, PMS2, PTPN11, RTEL1, RUNX1, SAMD9, SAMD9L, SRP72, TERC, TERT, TP53.   Based on Mr. Maceachern family history of cancer, he meets medical criteria for genetic testing due to the family history of an NTHL1 mutation and the family history of breast cancer. Despite that he meets criteria, he may still have an out of pocket cost. We discussed that if his out of pocket cost for testing is over $100, the laboratory should contact him to discuss self-pay options or patient pay assitance programs.   PLAN: After considering the risks, benefits, and limitations, Mr. Hacker provided informed consent to pursue genetic testing and the blood sample was sent to [facility] for analysis of the Multi-Cancer Panel and Lymphoma and Leukemia Panels. Results should be available within approximately 3 weeks' time, at which point they will be disclosed by telephone to Mr. Tabb, as will any additional recommendations warranted by these results. Mr. Dubie will receive a summary of his genetic counseling visit and a copy of his results once available. This information will also be available in Epic.  Lastly, we encouraged Mr. Adkison to remain in contact with cancer genetics annually so that we can continuously update the family history and inform him of any changes in cancer genetics and testing that may be of benefit for this family.   Mr. Fife questions were answered to his satisfaction today. Our contact information was provided should additional questions or concerns arise. Thank you for the referral and allowing [place] to share in the care of your patient.    M. Joette Catching, [place], [facility] Genetic Counselor [email or web address] (P) [phone]  The patient was seen for a total of 40 minutes in face-to-face genetic counseling. The patient was seen alone.  Drs. Magrinat,  Lindi Adie and/or Burr Medico were available to discuss this case as needed.    _______________________________________________________________________ For Office Staff:  Number of people involved in session: 1 Was an Intern/ student involved with case: no

## 2021-05-19 NOTE — Telephone Encounter (Signed)
Refill has been sent over to his pharmacy on file. Called and left a message for patient to inform him that the medication has been sent over.

## 2021-06-02 ENCOUNTER — Telehealth: Payer: Self-pay | Admitting: Genetic Counselor

## 2021-06-02 ENCOUNTER — Encounter: Payer: Self-pay | Admitting: Genetic Counselor

## 2021-06-02 ENCOUNTER — Ambulatory Visit: Payer: Self-pay | Admitting: Genetic Counselor

## 2021-06-02 DIAGNOSIS — Z1379 Encounter for other screening for genetic and chromosomal anomalies: Secondary | ICD-10-CM

## 2021-06-02 DIAGNOSIS — Z148 Genetic carrier of other disease: Secondary | ICD-10-CM

## 2021-06-02 DIAGNOSIS — Z85850 Personal history of malignant neoplasm of thyroid: Secondary | ICD-10-CM

## 2021-06-02 DIAGNOSIS — Z8571 Personal history of Hodgkin lymphoma: Secondary | ICD-10-CM

## 2021-06-02 NOTE — Telephone Encounter (Signed)
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  No known increased risk of cancer based on NTHL1 carrier result.  Discussed that we do not know why he has had cancer. It could be familial, due to a different gene that we are not testing, or maybe our current technology may not be able to pick something up.  He may wish to keep in contact with genetics to keep up with whether additional testing may be needed.

## 2021-06-10 ENCOUNTER — Ambulatory Visit (INDEPENDENT_AMBULATORY_CARE_PROVIDER_SITE_OTHER): Payer: Medicare Other

## 2021-06-10 ENCOUNTER — Other Ambulatory Visit: Payer: Self-pay

## 2021-06-10 DIAGNOSIS — J4550 Severe persistent asthma, uncomplicated: Secondary | ICD-10-CM

## 2021-06-23 ENCOUNTER — Encounter: Payer: Self-pay | Admitting: Allergy & Immunology

## 2021-06-23 ENCOUNTER — Ambulatory Visit (INDEPENDENT_AMBULATORY_CARE_PROVIDER_SITE_OTHER): Payer: Medicare Other | Admitting: Allergy & Immunology

## 2021-06-23 ENCOUNTER — Other Ambulatory Visit: Payer: Self-pay

## 2021-06-23 VITALS — BP 140/80 | HR 91 | Temp 98.2°F | Resp 16 | Ht 72.0 in | Wt 210.0 lb

## 2021-06-23 DIAGNOSIS — J3089 Other allergic rhinitis: Secondary | ICD-10-CM | POA: Diagnosis not present

## 2021-06-23 DIAGNOSIS — Y9389 Activity, other specified: Secondary | ICD-10-CM

## 2021-06-23 DIAGNOSIS — J4550 Severe persistent asthma, uncomplicated: Secondary | ICD-10-CM

## 2021-06-23 DIAGNOSIS — J302 Other seasonal allergic rhinitis: Secondary | ICD-10-CM | POA: Diagnosis not present

## 2021-06-23 DIAGNOSIS — T782XXD Anaphylactic shock, unspecified, subsequent encounter: Secondary | ICD-10-CM

## 2021-06-23 MED ORDER — TRELEGY ELLIPTA 200-62.5-25 MCG/INH IN AEPB: 1.0000 | INHALATION_SPRAY | Freq: Every day | RESPIRATORY_TRACT | 5 refills | Status: AC

## 2021-06-23 MED ORDER — EPINEPHRINE 0.3 MG/0.3ML IJ SOAJ: 0.3000 mg | Freq: Once | INTRAMUSCULAR | 2 refills | Status: AC

## 2021-06-23 NOTE — Progress Notes (Signed)
FOLLOW UP  Date of Service/Encounter:  06/23/21   Assessment:   Moderate persistent asthma - much better on Trelegy and Nucala    Elevated AEC in the past (600 in March 2020)   Perennial and seasonal allergic rhinitis (dust mites, cat, dog, grasses, molds, trees, and ragweed)   Complicated past medical history, including Hodgkin's lymphoma at age 45 and subsequent malignancies since that time    Plan/Recommendations:   1. Moderate persistent asthma, uncomplicated - Lung testing actually looked NORMAL today. - We are going to increase the Trelegy to the 243mg dose to see if this helps with the exercise.  - I am also going to get an IgE level in case we change to Xolair for treatment of your breathing and your anaphylaxis episodes.  - Daily controller medication(s): Trelegy 200/62.5/25 one puff once daily and Nucala monthly (with possible change to Xolair) - Prior to physical activity: albuterol 2 puffs 10-15 minutes before physical activity. - Rescue medications: albuterol 4 puffs every 4-6 hours as needed - Asthma control goals:  * Full participation in all desired activities (may need albuterol before activity) * Albuterol use two time or less a week on average (not counting use with activity) * Cough interfering with sleep two time or less a month * Oral steroids no more than once a year * No hospitalizations  2. Chronic rhinitis (dust mites, cat, dog, grasses, molds, trees, and ragweed) - Continue with the Zyrtec (cetirizine) '10mg'$  daily.  - Continue with Flonase one spray per nostril daily. - Continue with Astelin one spray per nostril daily. - We will hold off on an ENT referral for your nasal congestion with eating (call uKoreaif you change your mind).  3. 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 - Information this diagnosis emailed to you. - We may consider starting Xolair for control of your asthma and  improvement in your anaphylaxis frequency.   4. Return in about 2 months (around 08/23/2021).    Subjective:   RRIEL KORSONis a 45y.o. male presenting today for follow up of  Chief Complaint  Patient presents with   Asthma    Has exercised induced asthma - has been doing good     RALESSANDRO PRISOCKhas a history of the following: Patient Active Problem List   Diagnosis Date Noted   Genetic testing 06/02/2021   Family history of breast cancer 05/19/2021   History of splenectomy 02/02/2021   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   Erectile dysfunction 02/02/2021   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   [place]ulmonary fibrosis ([place] 02/02/2021   Peripheral neuropathy 02/02/2021   History of hepatitis C 02/02/2021   Back pain 02/02/2021   Hyperlipidemia 02/02/2021   Moderate persistent asthma, uncomplicated 10000000  History of Hodgkin's lymphoma 08/13/2020   Chronic rhinitis 08/13/2020   Not currently working due to disabled status 08/13/2020   Pain in joint of left knee 07/25/2020   History of thyroid cancer 06/09/2020   Arthralgia of left elbow 05/01/2020   Autonomic dysfunction 04/23/2020   Iron deficiency anemia due to chronic blood loss 01/18/2020   Microalbuminuria 01/18/2020   Chronic insomnia 01/17/2020   Large liver 01/17/2020   Prediabetes 01/11/2019   Daytime somnolence 07/22/2017   History of basal cell carcinoma (BCC) 11/30/2016   GERD (gastroesophageal reflux disease) 08/25/2016   Essential hypertension 08/25/2016   Substance induced mood disorder ([place]    History  of stroke     History obtained from: chart review and patient.  [name] is a 45 y.o. male presenting for a follow up visit.  He was last seen in March 2022.  At that time, lung testing looks fairly good.  We will continue with Trelegy 1 puff once daily as well as Nucala every month.  We continue with albuterol as needed.  For his rhinitis, we continue with the Zyrtec as well as the Flonase and we  added Astelin.  In the interim, he has done well.  Asthma/Respiratory Symptom History: He is doing well with regards to his asthma. He can do well on the bike if he is  "under the red line". But he can push himself harder and he develops some breathing issues and his breathing will "shut down". He does premedicate with the nebulizer before he goes biking. He typically just chills and "brings it back down". He does not get hives during that time, but he reports that he is wheezing during this time. There is nothing else that makes him feel like this - only exercise.  He always points to his throat when he talks about this happening.  Mepolizumab while it has helped his breathing, and has not really helped his  Allergic Rhinitis Symptom History: He remains on the Zyrtec is also Flonase.  He does use the Astelin.  However, he reports that every time he eats he becomes congested.  This is now. He has never seen ENT.  He does not really want a referral.  He recently had some genetic testing done to look for genetic causes of his cancer. He was always curious. His mother has some "wonky" stuff and he wanted to check on this. His brother likely needs to be tested since they have five kids.   Otherwise, there have been no changes to his past medical history, surgical history, family history, or social history.    Review of Systems  Constitutional: Negative.  Negative for chills, fever, malaise/fatigue and weight loss.  HENT:  Positive for congestion. Negative for ear discharge and ear pain.   Eyes:  Negative for pain, discharge and redness.  Respiratory:  Negative for cough, sputum production, shortness of breath and wheezing.   Cardiovascular: Negative.  Negative for chest pain and palpitations.  Gastrointestinal:  Negative for abdominal pain, heartburn, nausea and vomiting.  Skin: Negative.  Negative for itching and rash.  Neurological:  Negative for dizziness and headaches.  Endo/Heme/Allergies:   Negative for environmental allergies. Does not bruise/bleed easily.      Objective:   Blood pressure 140/80, pulse 91, temperature 98.2 F (36.8 C), resp. rate 16, height 6' (1.829 m), weight 210 lb (95.3 kg), SpO2 96 %. Body mass index is 28.48 kg/m.   Physical Exam:  Physical Exam Vitals reviewed.  Constitutional:      Appearance: He is well-developed.     Comments: Pleasant very talkative male.  HENT:     Head: Normocephalic and atraumatic.     Right Ear: Tympanic membrane, ear canal and external ear normal.     Left Ear: Tympanic membrane, ear canal and external ear normal.     Nose: No nasal deformity, septal deviation, mucosal edema or rhinorrhea.     Right Turbinates: Enlarged and swollen.     Left Turbinates: Enlarged and swollen.     Right Sinus: No maxillary sinus tenderness or frontal sinus tenderness.     Left Sinus: No maxillary sinus tenderness or frontal sinus tenderness.  Mouth/Throat:     Mouth: Mucous membranes are not pale and not dry.     Pharynx: Uvula midline.  Eyes:     General: Lids are normal. No allergic shiner.       Right eye: No discharge.        Left eye: No discharge.     Conjunctiva/sclera: Conjunctivae normal.     Right eye: Right conjunctiva is not injected. No chemosis.    Left eye: Left conjunctiva is not injected. No chemosis.    Pupils: Pupils are equal, round, and reactive to light.  Cardiovascular:     Rate and Rhythm: Normal rate and regular rhythm.     Heart sounds: Normal heart sounds.  Pulmonary:     Effort: Pulmonary effort is normal. No tachypnea, accessory muscle usage or respiratory distress.     Breath sounds: Normal breath sounds. No wheezing, rhonchi or rales.     Comments: Moving air well in all lung fields.  No increased work of breathing. Chest:     Chest wall: No tenderness.  Lymphadenopathy:     Cervical: No cervical adenopathy.  Skin:    Coloration: Skin is not pale.     Findings: No abrasion, erythema,  petechiae or rash. Rash is not papular, urticarial or vesicular.  Neurological:     Mental Status: He is alert.  Psychiatric:        Behavior: Behavior is cooperative.     Diagnostic studies:    Spirometry: results normal (FEV1: 3.72/90%, FVC: 4.63/88%, FEV1/FVC: 80%).    Spirometry consistent with normal pattern.    Allergy Studies: none       Salvatore Marvel, MD  [facility]

## 2021-06-23 NOTE — Patient Instructions (Addendum)
1. Moderate persistent asthma, uncomplicated - Lung testing actually looked NORMAL today. - We are going to increase the Trelegy to the 248mg dose to see if this helps with the exercise.  - I am also going to get an IgE level in case we change to Xolair for treatment of your breathing and your anaphylaxis episodes.  - Daily controller medication(s): Trelegy 200/62.5/25 one puff once daily and Nucala monthly (with possible change to Xolair) - Prior to physical activity: albuterol 2 puffs 10-15 minutes before physical activity. - Rescue medications: albuterol 4 puffs every 4-6 hours as needed - Asthma control goals:  * Full participation in all desired activities (may need albuterol before activity) * Albuterol use two time or less a week on average (not counting use with activity) * Cough interfering with sleep two time or less a month * Oral steroids no more than once a year * No hospitalizations  2. Chronic rhinitis (dust mites, cat, dog, grasses, molds, trees, and ragweed) - Continue with the Zyrtec (cetirizine) '10mg'$  daily.  - Continue with Flonase one spray per nostril daily. - Continue with Astelin one spray per nostril daily. - We will hold off on an ENT referral for your nasal congestion with eating (call u[place]oreaif you change your mind).  3. 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 - Information this diagnosis emailed to you. - We may consider starting Xolair for control of your asthma and improvement in your anaphylaxis frequency.   4. Return in about 2 months (around 08/23/2021).    Please inform u[place]oreaof any Emergency Department visits, hospitalizations, or changes in symptoms. Call u[place]oreabefore going to the ED for breathing or allergy symptoms since we might be able to fit you in for a sick visit. Feel free to contact uKoreaanytime with any questions, problems, or concerns.  It was a pleasure to see you again today! Prayers  for your mother's diagnosis...   Websites that have reliable patient information: 1. American Academy of Asthma, Allergy, and Immunology: www.aaaai.org 2. Food Allergy Research and Education (FARE): foodallergy.org 3. Mothers of Asthmatics: http://www.asthmacommunitynetwork.org 4. American College of Allergy, Asthma, and Immunology: www.acaai.org   COVID-19 Vaccine Information can be found at: [email or web address] questions related to vaccine distribution or appointments, please email [email or web address] or call [phone]   We realize that you might be concerned about having an allergic reaction to the COVID19 vaccines. To help with that concern, WE ARE OFFERING THE COVID19 VACCINES IN OUR OFFICE! Ask the front desk for dates!     "Like" u[place]on Facebook and Instagram for our latest updates!      A healthy democracy works best when ANew York Life Insuranceparticipate! Make sure you are registered to vote! If you have moved or changed any of your contact information, you will need to get this updated before voting!  In some cases, you MAY be able to register to vote online: [email or web address]

## 2021-06-25 ENCOUNTER — Encounter: Payer: Self-pay | Admitting: Allergy & Immunology

## 2021-06-27 LAB — IGE: IgE (Immunoglobulin E), Serum: 119 IU/mL (ref 6–495)

## 2021-06-28 ENCOUNTER — Encounter: Payer: Self-pay | Admitting: Allergy & Immunology

## 2021-07-03 ENCOUNTER — Telehealth: Payer: Self-pay | Admitting: *Deleted

## 2021-07-03 NOTE — Telephone Encounter (Signed)
Spoke to patient and advised approval and submit for Xolair. His delivery is already set up for his Nucala next week so he is going to go ahead with dose then plan on 2-3 weeks later starting new therapy Xolair

## 2021-07-06 NOTE — Telephone Encounter (Signed)
Thanks, Tam Tam!   Sederick Jacobsen, MD [facility]  

## 2021-07-08 ENCOUNTER — Ambulatory Visit (INDEPENDENT_AMBULATORY_CARE_PROVIDER_SITE_OTHER): Payer: Medicare Other | Admitting: *Deleted

## 2021-07-08 ENCOUNTER — Other Ambulatory Visit: Payer: Self-pay

## 2021-07-08 DIAGNOSIS — J4550 Severe persistent asthma, uncomplicated: Secondary | ICD-10-CM

## 2021-07-16 ENCOUNTER — Encounter: Payer: Self-pay | Admitting: Genetic Counselor

## 2021-07-16 DIAGNOSIS — Z148 Genetic carrier of other disease: Secondary | ICD-10-CM

## 2021-07-16 HISTORY — DX: Genetic carrier of other disease: Z14.8

## 2021-07-16 NOTE — Progress Notes (Signed)
HPI:  [name] was previously seen in the [place] clinic due to a personal history of cancer, a history of homozygous NTHL1 mutations in his mother, and a family history of cancer, and concerns regarding a hereditary predisposition to cancer. Please refer to our prior cancer genetics clinic note for more information regarding our discussion, assessment and recommendations, at the time. [name] recent genetic test results were disclosed to him, as were recommendations warranted by these results. These results and recommendations are discussed in more detail below.  CANCER HISTORY:    At the age of 45, [name] was diagnosed with Hodgkin lymphoma. The treatment plan included splenectomy and radiation.  Around the age of 23, [name] was diagnosed with papillary thyroid cancer s/p resection.  He also has a personal history of pulmonary fibrosis and has had several basal cell carcinomas removed from his head, neck, and back after the age of 56.      FAMILY HISTORY:  We obtained a detailed, 4-generation family history.  Significant diagnoses are listed below: Family History  Problem Relation Age of Onset   Breast cancer Mother        dx early 64s   Colon cancer Mother        dx early 15s   Other Mother        NTHL1 homozygous   Multiple myeloma Father        dx 19s   Breast cancer Maternal Aunt    Stroke Maternal Grandmother    Leukemia Other        dx <20; two paternal first cousins once removed     [name] is unaware of previous family history of genetic testing for hereditary cancer risks besides that mentioned above.  His mother is homozygous for an [place] mutation called c.268C>T (p.Gln90*). Patient's maternal ancestors are of [place] descent, and paternal ancestors are of [place] and [place] descent. There is no reported Ashkenazi Jewish ancestry. There is no known consanguinity.  GENETIC TEST RESULTS: Genetic testing reported out on May 29, 2021. 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  No other no pathogenic mutations were detected. The Multi-Cancer + Leukemia + Lymphoma panel included sequencing and deletion/duplication analysis of the following genes: ADA, AIP, ALK, ANKRD26*, APC*, ATM*, AXIN2, BAP1, BARD1, BLM, BMPR1A, BRCA1, BRCA2, BRIP1, CARD11, CARMIL2, CASP8, CASR, CBL, CD27, CDC73, CDH1, CDK4, CDKN1B, CDKN1C, CDKN2A (p14ARF), CDKN2A (p16INK4a), CEBPA, CHEK2, CTLA4, CTNNA1, CTPS1, DDX41, DICER1*, DIS3L2*, DOCK8, EGFR, ELANE, EPCAM*, ERCC6L2, ETV6, FADD, FAS, FASLG, FCHO1,FH*, FLCN, G6PC3, GATA2, GFI1*, GPC3*, GREM1*, HAX1, HOXB13, HRAS, IKZF1, IL2RA, IL2RB, ITK, KIT, KRAS, MAGT1, MAX*, MCM4, MECOM, MEN1*, MET*, MITF, MLH1*, MSH2*, MSH3*, MSH6*, MUTYH, NBN, NF1*, NF2, NTHL1, PALB2, PDGFRA, PHOX2B*, PIK3CD, PIK3R1, PMS2*, POLD1*, POLE, POT1, PRKAR1A, PRKCD, PTCH1, PTEN*, PTPN11, RAC2, RAD50, RAD51C, RAD51D, RASGRP1, RB1*, RECQL4*, RET, RHOH, RMRP, RTEL1, RUNX1, SAMD9, SAMD9L, SDHA*, SDHAF2, SDHB, SDHC*, SDHD, SH2D1A, SMAD4, SMARCA4, SMARCB1, SMARCE1, SRP72, STAT3, STK11, STK4, STXBP2, SUFU, TERC, TERT, TMEM127, TNFRSF13B, TP53, TPP2, TSC1*, TSC2, VHL, WAS, WRN*, WT1, XIAP.    The test report has been scanned into EPIC and is located under the Molecular Pathology section of the Results Review tab.  A portion of the result report is included below for reference.    Pathogenic variants in the NTHL1 gene are associated with autosomal recessvie NTHL1-associated polyposis. Since [name] has only one pathogenic mutation in Troy [name], he is NOT affected  with NTHL1-associated polyposis, but instead is a carrier.  At this time, NTHL1 carriers do not appear to be at increased risk for polyposis and/or colorectal cancer.   We discussed with [name] that because current genetic testing is not perfect, it is possible there may be a gene mutation in one of  these genes that current testing cannot detect, but that chance is small.  We also discussed, that there could be another gene that has not yet been discovered, or that we have not yet tested, that is responsible for the cancer diagnoses in the family. It is also possible there is a hereditary cause for the cancer in the family that [name] did not inherit and therefore was not identified in his testing.  Therefore, it is important to remain in touch with cancer genetics in the future so that we can continue to offer [name] the most up to date genetic testing.   Genetic testing did identify a variant of uncertain significance (VUS) in the BARD1 gene called c.568G>A (p.Asp190Asn).  At this time, it is unknown if this variant is associated with increased cancer risk or if this is a normal finding, but most variants such as this get reclassified to being inconsequential. It should not be used to make medical management decisions. With time, we suspect the lab will determine the significance of this variant, if any. If we do learn more about it, we will try to contact [name] to discuss it further. However, it is important to stay in touch with [place] periodically and keep the address and phone number up to date.  ADDITIONAL GENETIC TESTING:  We discussed with [name] that his genetic testing was fairly extensive.  If there are genes identified to increase cancer risk that can be analyzed in the future, we would be happy to discuss and coordinate this testing at that time.    CANCER SCREENING RECOMMENDATIONS: [name] test result is considered negative (normal).  This means that we have not identified a hereditary cause for his personal history of cancer at this time. Most cancers are sporadic/familial, and this negative test suggests that his cancer may fall into this category.    While reassuring, this does not definitively rule out a hereditary predisposition to cancer. It is still possible that  there could be genetic mutations that are undetectable by current technology. There could be genetic mutations in genes that have not been tested or identified to increase cancer risk.  Therefore, it is recommended he continue to follow the cancer management and screening guidelines provided by his primary healthcare provider.   An individual's cancer risk and medical management are not determined by genetic test results alone. Overall cancer risk assessment incorporates additional factors, including personal medical history, family history, and any available genetic information that may result in a personalized plan for cancer prevention and surveillance  RECOMMENDATIONS FOR FAMILY MEMBERS:  Individuals in this family might be at some increased risk of developing cancer, over the general population risk, simply due to the family history of cancer.  We recommended women in this family have a yearly mammogram beginning at age 87, or 15 years younger than the earliest onset of cancer, an annual clinical breast exam, and perform monthly breast self-exams. Women in this family should also have a gynecological exam as recommended by their primary provider. Family members should be referred for colonoscopy starting at age 71, or earlier, as recommended by their providers.   First degree relatives  of NTHL1 carriers have a 50% chance of also having the NTHL1 mutation.  NTHL1 carriers do not appear to be at increased risk for polyposis and/or colorectal cancer; however, there is an increased chance for the children of NTHL1 carriers to inherit two mutations and have NTHL1 associated polyposis.  Genetic testing/counseling is available to members of this family who wish to know their NTHL1-status.   FOLLOW-UP: Lastly, we discussed with [name] that cancer genetics is a rapidly advancing field and it is possible that new genetic tests will be appropriate for him and/or his family members in the future. We encouraged  him to remain in contact with cancer genetics on an annual basis so we can update his personal and family histories and let him know of advances in cancer genetics that may benefit this family.   Our contact number was provided. [name] questions were answered to his satisfaction, and he knows he is welcome to call us at anytime with additional questions or concerns.    Nilda Keathley M. Joette Catching, [place], [facility] Genetic Counselor [email or web address] (P) [phone]

## 2021-07-22 ENCOUNTER — Encounter: Payer: Self-pay | Admitting: Allergy & Immunology

## 2021-07-23 ENCOUNTER — Other Ambulatory Visit: Payer: Self-pay

## 2021-07-23 MED ORDER — ALBUTEROL SULFATE HFA 108 (90 BASE) MCG/ACT IN AERS: 2.0000 | INHALATION_SPRAY | Freq: Four times a day (QID) | RESPIRATORY_TRACT | 1 refills | Status: DC | PRN

## 2021-07-25 ENCOUNTER — Encounter: Payer: Self-pay | Admitting: Family Medicine

## 2021-07-28 ENCOUNTER — Ambulatory Visit (HOSPITAL_COMMUNITY): Admit: 2021-07-28 | Payer: Medicare Other

## 2021-07-28 ENCOUNTER — Other Ambulatory Visit: Payer: Self-pay

## 2021-07-29 ENCOUNTER — Ambulatory Visit: Payer: Medicare Other

## 2021-07-29 ENCOUNTER — Ambulatory Visit (INDEPENDENT_AMBULATORY_CARE_PROVIDER_SITE_OTHER): Payer: Medicare Other | Admitting: Family Medicine

## 2021-07-29 ENCOUNTER — Encounter: Payer: Self-pay | Admitting: Family Medicine

## 2021-07-29 VITALS — BP 136/80 | HR 110 | Temp 98.2°F | Ht 72.0 in | Wt 210.2 lb

## 2021-07-29 DIAGNOSIS — T782XXA Anaphylactic shock, unspecified, initial encounter: Secondary | ICD-10-CM | POA: Insufficient documentation

## 2021-07-29 DIAGNOSIS — Z23 Encounter for immunization: Secondary | ICD-10-CM

## 2021-07-29 DIAGNOSIS — S8012XA Contusion of left lower leg, initial encounter: Secondary | ICD-10-CM | POA: Diagnosis not present

## 2021-07-29 DIAGNOSIS — M19041 Primary osteoarthritis, right hand: Secondary | ICD-10-CM | POA: Diagnosis not present

## 2021-07-29 DIAGNOSIS — Z85828 Personal history of other malignant neoplasm of skin: Secondary | ICD-10-CM

## 2021-07-29 DIAGNOSIS — G909 Disorder of the autonomic nervous system, unspecified: Secondary | ICD-10-CM

## 2021-07-29 NOTE — Progress Notes (Signed)
[facility] [address] [place] [address] Dept: [phone] Dept Fax: [phone]  Office Visit  Subjective:    Patient ID: [name], male    DOB: Sep 09, 1976, 45 y.o..   MRN: [id number]  Chief Complaint  Patient presents with   Acute Visit    C/o having c/o a scrap on his LT shin x 1 day after slipping at the Gym. Wants flu shot.    History of Present Illness:  Patient is in today having injured his left shin yesterday. He was on a weight machine and his left foot slipped, scraping the shin. He had rapid onset of swelling and a hematoma along with an abrasion. He wrapped this with an ACE wrap overnight and the swelling has decreased. He notes this is still tender, but not as bad as yesterday.,  Mr. Deboer also notes he has a small lesion on his mid chest. He has had priro basal cellc arcinomas and is concerned that this might be a new lesion.  Mr. Schorr also notes some burning pain and in his right 2nd finger with some associated swellign of the PIP joint. 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 he does do heavy weight lifting and thinks this may have exacerbated his hand issue.  Mr. Schaper also notes that he had a recent episode where his BP at home was in the 90s systolic. this recovered within a day and has not recurred. He does have a past history of autonomic dysfunction related to his prior strokes. He notes he wanted me to be aware of the episode.  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   Pulmonary fibrosis (HCC) 02/02/2021   Peripheral neuropathy 02/02/2021   History of hepatitis C  02/02/2021   Back pain 02/02/2021   Hyperlipidemia 02/02/2021   Moderate persistent asthma, uncomplicated 08/13/2020   History of Hodgkin's lymphoma 08/13/2020   Chronic rhinitis 08/13/2020   Not currently working due to disabled status 08/13/2020   Pain in joint of left knee 07/25/2020   History of thyroid cancer 06/09/2020   Arthralgia of left elbow 05/01/2020   Autonomic dysfunction 04/23/2020   Iron deficiency anemia due to chronic blood loss 01/18/2020   Microalbuminuria 01/18/2020   Chronic insomnia 01/17/2020   Large liver 01/17/2020   Prediabetes 01/11/2019   Daytime somnolence 07/22/2017   History of basal cell carcinoma (BCC) 11/30/2016   GERD (gastroesophageal reflux disease) 08/25/2016   Essential hypertension 08/25/2016   Substance induced mood disorder (HCC)    History of stroke    Past Surgical History:  Procedure Laterality Date   COLONOSCOPY  07/04/2019   done for hematochezia and FH col ca-mother: Mod int hem w/inflamm.  No adenomatous polyps.   INCISIONAL HERNIA REPAIR  06/2019   RUQ (prev lap append site)   LAPAROSCOPIC APPENDECTOMY N/A 03/25/2017   Procedure: APPENDECTOMY LAPAROSCOPIC; EXTENSIVE LYSIS OF ADHESIONS FROM PRIOR RADIATION AND SURGERY;  Surgeon: Luretha Murphy, MD;  Location: [facility];  Service: General;  Laterality: N/A;   PARATHYROIDECTOMY     SPLENECTOMY     TEE WITHOUT CARDIOVERSION N/A 07/18/2015   Procedure: TRANSESOPHAGEAL ECHOCARDIOGRAM (TEE);  Surgeon: Deloris Ping  Nahser, MD;  Location: [facility];  Service: Cardiovascular;  Laterality: N/A;   THYROIDECTOMY     Family History  Problem Relation Age of Onset   Breast cancer Mother        dx early 55s   Colon cancer Mother        dx early 61s   Other Mother        NTHL1 homozygous   Multiple myeloma Father        dx 66s   Breast cancer Maternal Aunt    Stroke Maternal Grandmother    Leukemia Other        dx <20; two paternal first cousins once removed   Outpatient Medications  Prior to Visit  Medication Sig Dispense Refill   albuterol (PROVENTIL) (5 MG/ML) 0.5% nebulizer solution      albuterol (VENTOLIN HFA) 108 (90 Base) MCG/ACT inhaler Inhale 2 puffs into the lungs every 6 (six) hours as needed. For shortness of breath. 8 g 1   ammonium lactate (LAC-HYDRIN) 12 % lotion Apply 1 application topically as needed for dry skin.     aspirin 325 MG tablet Take 1 tablet (325 mg total) by mouth daily. 30 tablet 0   azelastine (ASTELIN) 0.1 % nasal spray Place 1 spray into both nostrils daily. Use in each nostril as directed 30 mL 5   baclofen (LIORESAL) 10 MG tablet Take 1 tablet (10 mg total) by mouth 3 (three) times daily. PRN. 30 each 3   EPINEPHrine 0.3 mg/0.3 mL IJ SOAJ injection Inject into the muscle.     esomeprazole (NEXIUM) 40 MG capsule Take 1 capsule (40 mg total) by mouth 2 (two) times daily before a meal. 180 capsule 3   EUTHYROX 200 MCG tablet Take 200 mcg by mouth daily.     ferrous sulfate (FERROUSUL) 325 (65 FE) MG tablet Take 1 tablet (325 mg total) by mouth 2 (two) times daily with a meal. 180 tablet 1   fluticasone (FLONASE) 50 MCG/ACT nasal spray Place 1 spray into both nostrils daily. 9.9 mL 6   gabapentin (NEURONTIN) 300 MG capsule Take 4 capsules by mouth every morning, 2 capsules by mouth at noon, and 2 capsules by mouth at bedtime. 240 capsule 3   hydrOXYzine (VISTARIL) 50 MG capsule Take 1 capsule (50 mg total) by mouth at bedtime. 90 capsule 3   lisinopril (ZESTRIL) 20 MG tablet Take 1 tablet (20 mg total) by mouth daily. 90 tablet 3   LUER LOCK SAFETY SYRINGES 22G X 1-1/2" 3 ML MISC USE FOR SELF ADMINISTER OF TESTOSTERONE 2CC ONCE EVERY 2 WEEKS     NUCALA 100 MG SOLR Inject into the skin.     rosuvastatin (CRESTOR) 40 MG tablet Take 1 tablet (40 mg total) by mouth daily. 90 tablet 3   testosterone cypionate (DEPOTESTOTERONE CYPIONATE) 100 MG/ML injection Inject 100 mg into the muscle every 14 (fourteen) days. For IM use only     traZODone  (DESYREL) 100 MG tablet Take 1 tablet (100 mg total) by mouth at bedtime. Take together with trazodone 300 mg at bedtime (total of 400 mg daily) 90 tablet 3   trazodone (DESYREL) 300 MG tablet Take 1 tablet (300 mg total) by mouth at bedtime. 90 tablet 3   verapamil (VERELAN PM) 180 MG 24 hr capsule Take 1 capsule (180 mg total) by mouth at bedtime. 90 capsule 3   zaleplon (SONATA) 10 MG capsule Take 1 capsule (10 mg total) by mouth at bedtime as needed  for sleep (takes 10-20 mg). 120 capsule 3   Facility-Administered Medications Prior to Visit  Medication Dose Route Frequency Provider Last Rate Last Admin   Mepolizumab SOLR 100 mg  100 mg Subcutaneous Q28 days Alfonse Spruce, MD   100 mg at 07/08/21 1108   Allergies  Allergen Reactions   Ciprofloxacin Other (See Comments)    Spontaneous rupture of tendon   Earnestine Leys Officinalis] Anaphylaxis and Hives   Fluocinolone Other (See Comments)    "spontaneous rupture of tendons" per pt dr.   Dione Housekeeper:   Today's Vitals   07/29/21 1132  BP: 136/80  Pulse: (!) 110  Temp: 98.2 F (36.8 C)  TempSrc: Temporal  SpO2: 95%  Weight: 210 lb 3.2 oz (95.3 kg)  Height: 6' (1.829 m)   Body mass index is 28.51 kg/m.   General: Well developed, well nourished. No acute distress. Extremities: The right 2nd finger has some mild bogginess around the PIP joint, but no redness and good   ROM. There is an area of abrasion and bruising over the anterior shin of the left lower leg. There is mild   swelling present. Skin: Warm and dry. There is a 2-3 mm raised red plaque in the mid chest. Psych: Alert and oriented. Normal mood and affect.  Health Maintenance Due  Topic Date Due   COVID-19 Vaccine (1) Never done   INFLUENZA VACCINE  05/11/2021   Pneumococcal Vaccine 50-9 Years old (2 - PPSV23 or PCV20) 07/07/2021     Assessment & Plan:   1. Contusion of left lower leg, initial encounter We discussed home management, including ice for the  next 24 hours, compression with an ACE wrap , and relative rest. I recommended he start doing hot soaks tomorrow to speed resolution.  2. Need for influenza vaccination  - Flu Vaccine QUAD 6+ mos PF IM (Fluarix Quad PF)  3. History of basal cell carcinoma (BCC) The lesion on the chest could be an early BCC, esp. in light of his prior history. He would like to establish with a dermatologist in [place], so I will refer him.  - Ambulatory referral to Dermatology  4. Arthritis of finger of right hand I recommend he back off on heavy lifting during his weight training to allow his finger to improve. He can use topical Voltaren gel for comfort.  5. Autonomic dysfunction The BP issue may be due to his prior autonomic issues. We will monitor for now.  Loyola Mast, MD

## 2021-08-05 ENCOUNTER — Other Ambulatory Visit: Payer: Self-pay

## 2021-08-05 ENCOUNTER — Ambulatory Visit (INDEPENDENT_AMBULATORY_CARE_PROVIDER_SITE_OTHER): Payer: Medicare Other

## 2021-08-05 ENCOUNTER — Ambulatory Visit: Payer: Medicare Other | Admitting: Family Medicine

## 2021-08-05 DIAGNOSIS — J4550 Severe persistent asthma, uncomplicated: Secondary | ICD-10-CM | POA: Diagnosis not present

## 2021-08-05 MED ORDER — OMALIZUMAB 150 MG ~~LOC~~ SOLR
300.0000 mg | SUBCUTANEOUS | Status: DC
  Administered 2021-08-05: 300 mg via SUBCUTANEOUS

## 2021-08-05 NOTE — Progress Notes (Signed)
Patient came in today to start his Xolair 300mg  injections. Patient was aware of our office procedures as he has been on biologics for a while and just recently switched to [place]. Patient waited in his car for thirty minuets after his injections and came back to have his arms looked at. Patients arms looked well and he denied having any issues. Patient was scheduled for two weeks out for his next injections.

## 2021-08-12 ENCOUNTER — Encounter: Payer: Self-pay | Admitting: Allergy & Immunology

## 2021-08-13 NOTE — Telephone Encounter (Signed)
Noted  

## 2021-08-16 ENCOUNTER — Other Ambulatory Visit: Payer: Self-pay | Admitting: Family Medicine

## 2021-08-16 DIAGNOSIS — M545 Low back pain, unspecified: Secondary | ICD-10-CM

## 2021-08-16 DIAGNOSIS — G8929 Other chronic pain: Secondary | ICD-10-CM

## 2021-08-19 ENCOUNTER — Ambulatory Visit: Payer: Medicare Other

## 2021-08-25 ENCOUNTER — Ambulatory Visit: Payer: Medicare Other | Admitting: Allergy & Immunology

## 2021-09-13 ENCOUNTER — Encounter: Payer: Self-pay | Admitting: Family Medicine

## 2021-09-19 ENCOUNTER — Other Ambulatory Visit: Payer: Self-pay | Admitting: Family Medicine

## 2021-09-19 DIAGNOSIS — G63 Polyneuropathy in diseases classified elsewhere: Secondary | ICD-10-CM

## 2021-09-19 DIAGNOSIS — F5104 Psychophysiologic insomnia: Secondary | ICD-10-CM

## 2021-09-29 ENCOUNTER — Other Ambulatory Visit: Payer: Self-pay

## 2021-09-30 ENCOUNTER — Encounter: Payer: Self-pay | Admitting: Family Medicine

## 2021-09-30 ENCOUNTER — Ambulatory Visit (INDEPENDENT_AMBULATORY_CARE_PROVIDER_SITE_OTHER): Payer: Medicare Other | Admitting: Family Medicine

## 2021-09-30 VITALS — BP 130/84 | HR 103 | Temp 98.7°F | Ht 72.0 in | Wt 220.8 lb

## 2021-09-30 DIAGNOSIS — M7551 Bursitis of right shoulder: Secondary | ICD-10-CM

## 2021-09-30 DIAGNOSIS — R7303 Prediabetes: Secondary | ICD-10-CM

## 2021-09-30 LAB — HEMOGLOBIN A1C: Hgb A1c MFr Bld: 6.1 % (ref 4.6–6.5)

## 2021-09-30 NOTE — Progress Notes (Signed)
[facility] [address] [place] [address] Dept: [phone] Dept Fax: [phone]  Office Visit  Subjective:    Patient ID: Troy [name], male    DOB: Sep 29, 1976, 45 y.o..   MRN: [id number]  Chief Complaint  Patient presents with   Acute Visit    C/o having lump on Rt shoulder (seen it 2-3 weeks ago).      History of Present Illness:  Patient is in today for evaluation of a lump on his right shoulder. Troy [name] notes that he felt a lump on the posterior aspect of the right shoulder about 2-3 weeks ago. He notes this is where the barbell sits when he does squats as part of power lifting. He has not noted a similar bump on the other shoudler. He denies any pain associated with this.  Troy [name] has a history of prediabetes. He had wanted a trial off of metformin. We are due to repeat his A1c to see if this is now normal.  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   Genetic testing 06/02/2021   Family history of breast cancer 05/19/2021   History of splenectomy 02/02/2021   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   Erectile dysfunction 02/02/2021   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   Pulmonary fibrosis (HCC) 02/02/2021   Peripheral neuropathy 02/02/2021   History of hepatitis C 02/02/2021   Back pain 02/02/2021   Hyperlipidemia 02/02/2021   Moderate persistent asthma, uncomplicated 08/13/2020   History of Hodgkin's lymphoma 08/13/2020   Chronic rhinitis 08/13/2020   Not currently working due to disabled status 08/13/2020   Pain in joint of left knee 07/25/2020   History of thyroid cancer 06/09/2020   Arthralgia of left elbow 05/01/2020   Autonomic dysfunction 04/23/2020   Iron deficiency anemia due to chronic blood loss 01/18/2020   Microalbuminuria 01/18/2020   Chronic insomnia 01/17/2020   Large  liver 01/17/2020   Prediabetes 01/11/2019   Daytime somnolence 07/22/2017   History of basal cell carcinoma (BCC) 11/30/2016   GERD (gastroesophageal reflux disease) 08/25/2016   Essential hypertension 08/25/2016   Substance induced mood disorder (HCC)    History of stroke    Past Surgical History:  Procedure Laterality Date   COLONOSCOPY  07/04/2019   done for hematochezia and FH col ca-mother: Mod int hem w/inflamm.  No adenomatous polyps.   INCISIONAL HERNIA REPAIR  06/2019   RUQ (prev lap append site)   LAPAROSCOPIC APPENDECTOMY N/A 03/25/2017   Procedure: APPENDECTOMY LAPAROSCOPIC; EXTENSIVE LYSIS OF ADHESIONS FROM PRIOR RADIATION AND SURGERY;  Surgeon: Luretha Murphy, MD;  Location: [facility];  Service: General;  Laterality: N/A;   PARATHYROIDECTOMY     SPLENECTOMY     TEE WITHOUT CARDIOVERSION N/A 07/18/2015   Procedure: TRANSESOPHAGEAL ECHOCARDIOGRAM (TEE);  Surgeon: [name], MD;  Location: [facility] ENDOSCOPY;  Service: Cardiovascular;  Laterality: N/A;   THYROIDECTOMY     Family History  Problem Relation Age of Onset   Breast cancer Mother        dx early 29s   Colon cancer Mother        dx early 41s   Other Mother        NTHL1 homozygous   Multiple myeloma Father        dx 45s   Breast cancer Maternal Aunt    Stroke Maternal Grandmother    Leukemia  Other        dx <20; two paternal first cousins once removed    Outpatient Medications Prior to Visit  Medication Sig Dispense Refill   albuterol (PROVENTIL) (5 MG/ML) 0.5% nebulizer solution      albuterol (VENTOLIN HFA) 108 (90 Base) MCG/ACT inhaler Inhale 2 puffs into the lungs every 6 (six) hours as needed. For shortness of breath. 8 g 1   ammonium lactate (LAC-HYDRIN) 12 % lotion Apply 1 application topically as needed for dry skin.     aspirin 325 MG tablet Take 1 tablet (325 mg total) by mouth daily. 30 tablet 0   azelastine (ASTELIN) 0.1 % nasal spray Place 1 spray into both nostrils daily. Use in each nostril  as directed 30 mL 5   baclofen (LIORESAL) 10 MG tablet Take 1 tablet by mouth three times daily as needed 30 tablet 5   EPINEPHrine 0.3 mg/0.3 mL IJ SOAJ injection Inject into the muscle.     esomeprazole (NEXIUM) 40 MG capsule Take 1 capsule (40 mg total) by mouth 2 (two) times daily before a meal. 180 capsule 3   EUTHYROX 200 MCG tablet Take 200 mcg by mouth daily.     ferrous sulfate (FERROUSUL) 325 (65 FE) MG tablet Take 1 tablet (325 mg total) by mouth 2 (two) times daily with a meal. 180 tablet 1   fluticasone (FLONASE) 50 MCG/ACT nasal spray Place 1 spray into both nostrils daily. 9.9 mL 6   gabapentin (NEURONTIN) 300 MG capsule TAKE 4 CAPSULES BY MOUTH IN THE MORNING AND 2 CAPSULES AT NOON AND 2 CAPSULES AT BEDTIME 240 capsule 1   hydrOXYzine (VISTARIL) 50 MG capsule Take 1 capsule (50 mg total) by mouth at bedtime. 90 capsule 3   lisinopril (ZESTRIL) 20 MG tablet Take 1 tablet (20 mg total) by mouth daily. 90 tablet 3   LUER LOCK SAFETY SYRINGES 22G X 1-1/2" 3 ML MISC USE FOR SELF ADMINISTER OF TESTOSTERONE 2CC ONCE EVERY 2 WEEKS     rosuvastatin (CRESTOR) 40 MG tablet Take 1 tablet (40 mg total) by mouth daily. 90 tablet 3   testosterone cypionate (DEPOTESTOTERONE CYPIONATE) 100 MG/ML injection Inject 100 mg into the muscle every 14 (fourteen) days. For IM use only     traZODone (DESYREL) 100 MG tablet Take 1 tablet (100 mg total) by mouth at bedtime. Take together with trazodone 300 mg at bedtime (total of 400 mg daily) 90 tablet 3   trazodone (DESYREL) 300 MG tablet Take 1 tablet (300 mg total) by mouth at bedtime. 90 tablet 3   verapamil (VERELAN PM) 180 MG 24 hr capsule Take 1 capsule (180 mg total) by mouth at bedtime. 90 capsule 3   zaleplon (SONATA) 10 MG capsule TAKE 1 CAPSULES BY MOUTH AT BEDTIME AS NEEDED FOR SLEEP (TAKES 10-20MG ) 120 capsule 0   TRELEGY ELLIPTA 200-62.5-25 MCG/ACT AEPB Inhale 1 puff into the lungs daily.     NUCALA 100 MG SOLR Inject into the skin. (Patient not  taking: Reported on 09/30/2021)     Facility-Administered Medications Prior to Visit  Medication Dose Route Frequency Provider Last Rate Last Admin   Mepolizumab SOLR 100 mg  100 mg Subcutaneous Q28 days Alfonse Spruce, MD   100 mg at 07/08/21 1108   omalizumab Geoffry Paradise) injection 300 mg  300 mg Subcutaneous Q14 Days Ellamae Sia, DO   300 mg at 08/05/21 1655   Allergies  Allergen Reactions   Ciprofloxacin Other (See Comments)  Spontaneous rupture of tendon   Earnestine Leys Officinalis] Anaphylaxis and Hives   Fluocinolone Other (See Comments)    "spontaneous rupture of tendons" per pt dr.    Dione Housekeeper:   Today's Vitals   09/30/21 1409  BP: 130/84  Pulse: (!) 103  Temp: 98.7 F (37.1 C)  TempSrc: Temporal  SpO2: 97%  Weight: 220 lb 12.8 oz (100.2 kg)  Height: 6' (1.829 m)   Body mass index is 29.95 kg/m.   General: Well developed, well nourished. No acute distress. Skin: Warm and dry. There are three points of redness with mild scaliness acroos the upper back,   correlating with points of pressure from use of a barbell. The point at the right shoulder (posterior tot he   acromion) is a slight raised fluctuant area that is nontender. Psych: Alert and oriented x3. Normal mood and affect.  Health Maintenance Due  Topic Date Due   COVID-19 Vaccine (1) Never done   Pneumococcal Vaccine 30-87 Years old (2 - PPSV23 if available, else PCV20) 07/07/2021     Assessment & Plan:   1. Bursitis of right shoulder This feels like a bursa overlying the spine of the scapula as it forms into the acromion. I suspect this has become fluid filled due to repetitive trauma form rubbing and pressure of the barbell. Troy [name] notes he is not allowed to train with padding over the barbell, as that would not be used in competition. I recommend he massage Voltaren gel into this area 3-4 times a day. He used ice this after weight lifting. If this is not helping to resolve the issue or if it  becomes painful, he may need to back off on his lifting.  2. Prediabetes We will reassess Troy [name] A1c.  - Hemoglobin A1c  Loyola Mast, MD

## 2021-10-09 ENCOUNTER — Other Ambulatory Visit: Payer: Self-pay | Admitting: Family Medicine

## 2021-10-09 DIAGNOSIS — F5104 Psychophysiologic insomnia: Secondary | ICD-10-CM

## 2021-10-15 DIAGNOSIS — M19019 Primary osteoarthritis, unspecified shoulder: Secondary | ICD-10-CM | POA: Insufficient documentation

## 2021-10-20 ENCOUNTER — Encounter: Payer: Self-pay | Admitting: Allergy & Immunology

## 2021-10-20 ENCOUNTER — Ambulatory Visit (INDEPENDENT_AMBULATORY_CARE_PROVIDER_SITE_OTHER): Payer: Commercial Managed Care - HMO | Admitting: Allergy & Immunology

## 2021-10-20 ENCOUNTER — Other Ambulatory Visit: Payer: Self-pay

## 2021-10-20 VITALS — BP 134/70 | HR 112 | Temp 98.2°F | Resp 20 | Ht 72.0 in | Wt 210.0 lb

## 2021-10-20 DIAGNOSIS — T782XXD Anaphylactic shock, unspecified, subsequent encounter: Secondary | ICD-10-CM

## 2021-10-20 DIAGNOSIS — J302 Other seasonal allergic rhinitis: Secondary | ICD-10-CM

## 2021-10-20 DIAGNOSIS — J4550 Severe persistent asthma, uncomplicated: Secondary | ICD-10-CM

## 2021-10-20 DIAGNOSIS — J3089 Other allergic rhinitis: Secondary | ICD-10-CM

## 2021-10-20 MED ORDER — ALBUTEROL SULFATE HFA 108 (90 BASE) MCG/ACT IN AERS: 2.0000 | INHALATION_SPRAY | Freq: Four times a day (QID) | RESPIRATORY_TRACT | 1 refills | Status: DC | PRN

## 2021-10-20 MED ORDER — TRELEGY ELLIPTA 200-62.5-25 MCG/ACT IN AEPB: 1.0000 | INHALATION_SPRAY | Freq: Every day | RESPIRATORY_TRACT | 5 refills | Status: DC

## 2021-10-20 MED ORDER — AZELASTINE HCL 0.1 % NA SOLN: 1.0000 | Freq: Every day | NASAL | 5 refills | Status: DC

## 2021-10-20 NOTE — Progress Notes (Signed)
FOLLOW UP  Date of Service/Encounter:  10/20/21   Assessment:   Moderate persistent asthma - much better on Trelegy and Nucala    Elevated AEC in the past (600 in March 2020)   Perennial and seasonal allergic rhinitis (dust mites, cat, dog, grasses, molds, trees, and ragweed)   Complicated past medical history, including Hodgkin's lymphoma at age 46 and subsequent malignancies since that time  Plan/Recommendations:    1. Moderate persistent asthma, uncomplicated - Lung testing looks decent today. - We are not going to make any changes at this time. - Daily controller medication(s): Trelegy 200/62.5/25 one puff once daily  - Prior to physical activity: albuterol 2 puffs 10-15 minutes before physical activity. - Rescue medications: albuterol 4 puffs every 4-6 hours as needed - Asthma control goals:  * Full participation in all desired activities (may need albuterol before activity) * Albuterol use two time or less a week on average (not counting use with activity) * Cough interfering with sleep two time or less a month * Oral steroids no more than once a year * No hospitalizations  2. Chronic rhinitis (dust mites, cat, dog, grasses, molds, trees, and ragweed) - Continue with the Zyrtec (cetirizine) 10mg  daily.  - Continue with Flonase one spray per nostril daily. - Continue with Astelin one spray per nostril daily.  3. 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 - No further episodes. - We will continue to monitor.   4. Return in about 6 months (around 04/19/2022).     Subjective:   Troy [name] is a 46 y.o. male presenting today for follow up of  Chief Complaint  Patient presents with   Asthma    Says it is the same.     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   07/16/2021   Genetic testing 06/02/2021   Family history of breast cancer 05/19/2021   History of splenectomy 02/02/2021   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   Erectile dysfunction 02/02/2021   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   Pulmonary fibrosis ([place]) 02/02/2021   Peripheral neuropathy 02/02/2021   History of hepatitis C 02/02/2021   Back pain 02/02/2021   Hyperlipidemia 02/02/2021   Moderate persistent asthma, uncomplicated 78/93/8101   History of Hodgkin's lymphoma 08/13/2020   Chronic rhinitis 08/13/2020   Not currently working due to disabled status 08/13/2020   Pain in joint of left knee 07/25/2020   History of thyroid cancer 06/09/2020   Arthralgia of left elbow 05/01/2020   Autonomic dysfunction 04/23/2020   Iron deficiency anemia due to chronic blood loss 01/18/2020   Microalbuminuria 01/18/2020   Chronic insomnia 01/17/2020   Large liver 01/17/2020   Prediabetes 01/11/2019   Daytime somnolence 07/22/2017   History of basal cell carcinoma (BCC) 11/30/2016   GERD (gastroesophageal reflux disease) 08/25/2016   Essential hypertension 08/25/2016   Substance induced mood disorder ([place])    History of stroke     History obtained from: chart review and patient.  Troy [name] is a 46 y.o. male presenting for a follow up visit. He was last seen in September 2022. At that time, his lung testing actually look normal.  We increased his Trelegy to the 200 mcg dose to see if that would help with his exercise tolerance.  We also obtained an IgE  level to see if he could qualify for Xolair due to his history of anaphylaxis.  In the interim, he decided to stop all of the injectables completely.  He never did go with a trial of Xolair.  Since last visit, he has done fairly well.  He is slightly irritable today because he waited so long in the waiting room.  Asthma/Respiratory Symptom History: He remains on the Trelegy 1 puff once daily.  This seems to be working very well.  With the colder  weather, he has transitioned away from biking and is doing power lifting.  He has not required any prednisone.  He continues to report that the Trelegy allows him to do more than before he was on the Trelegy at all.  He does not feel any different not having the Nucala on board.  He tells me today that it did not help, but had previous visits I think he reported that it was helping.  Regardless, he has been stable.    He is wondering today whether he can get albuterol that looks like the old Ventolin.  I looked at the various inhalers that he has and he has been on albuterol but not the purple top.  He says that this does not work as well.  He prefers the 1 that is any bluish container.  The [place] number is [phone].  Allergic Rhinitis Symptom History: He remains on the Zyrtec and the Flonase.  He also has Astelin on board as well.  He has not required any antibiotics.  He has had no more anaphylaxis episodoes with exercise. He is overall doing very well. He recently had a basal cell carcinoma removed by Dr. Etta Quill yesterday.   His Dad is doing well, all things considered.  He has Parkinson's and multiple myeloma, as in the note that the prognosis is not great.  Otherwise, there have been no changes to his past medical history, surgical history, family history, or social history.    Review of Systems  Constitutional: Negative.  Negative for fever, malaise/fatigue and weight loss.  HENT: Negative.  Negative for congestion, ear discharge and ear pain.   Eyes:  Negative for pain, discharge and redness.  Respiratory:  Negative for cough, sputum production, shortness of breath and wheezing.   Cardiovascular: Negative.  Negative for chest pain and palpitations.  Gastrointestinal:  Negative for abdominal pain and heartburn.  Skin: Negative.  Negative for itching and rash.  Neurological:  Negative for dizziness and headaches.  Endo/Heme/Allergies:  Negative for environmental allergies. Does not  bruise/bleed easily.  All other systems reviewed and are negative.     Objective:   Blood pressure 134/70, pulse (!) 112, temperature 98.2 F (36.8 C), temperature source Temporal, resp. rate 20, height 6' (1.829 m), weight 210 lb (95.3 kg), SpO2 97 %. Body mass index is 28.48 kg/m.   Physical Exam:  Physical Exam Vitals reviewed.  Constitutional:      Appearance: He is well-developed.     Comments: Pleasant very talkative male.  HENT:     Head: Normocephalic and atraumatic.     Right Ear: Tympanic membrane, ear canal and external ear normal.     Left Ear: Tympanic membrane, ear canal and external ear normal.     Nose: No nasal deformity, septal deviation, mucosal edema or rhinorrhea.     Right Turbinates: Enlarged, swollen and pale.     Left Turbinates: Enlarged, swollen and pale.     Right Sinus: No maxillary sinus tenderness  or frontal sinus tenderness.     Left Sinus: No maxillary sinus tenderness or frontal sinus tenderness.     Mouth/Throat:     Mouth: Mucous membranes are not pale and not dry.     Pharynx: Uvula midline.  Eyes:     General: Lids are normal. No allergic shiner.       Right eye: No discharge.        Left eye: No discharge.     Conjunctiva/sclera: Conjunctivae normal.     Right eye: Right conjunctiva is not injected. No chemosis.    Left eye: Left conjunctiva is not injected. No chemosis.    Pupils: Pupils are equal, round, and reactive to light.  Cardiovascular:     Rate and Rhythm: Normal rate and regular rhythm.     Heart sounds: Normal heart sounds.  Pulmonary:     Effort: Pulmonary effort is normal. No tachypnea, accessory muscle usage or respiratory distress.     Breath sounds: Normal breath sounds. No wheezing, rhonchi or rales.     Comments: Moving air well in all lung fields.  No increased work of breathing. Chest:     Chest wall: No tenderness.  Lymphadenopathy:     Cervical: No cervical adenopathy.  Skin:    Coloration: Skin is not  pale.     Findings: No abrasion, erythema, petechiae or rash. Rash is not papular, urticarial or vesicular.  Neurological:     Mental Status: He is alert.  Psychiatric:        Behavior: Behavior is cooperative.     Diagnostic studies:    Spirometry: results abnormal (FEV1: 2.88/66%, FVC: 3.98/72%, FEV1/FVC: 72%).    Spirometry consistent with possible restrictive disease.    Allergy Studies: none        Salvatore Marvel, MD  [facility]

## 2021-10-20 NOTE — Patient Instructions (Addendum)
1. Moderate persistent asthma, uncomplicated - Lung testing looks decent today. - We are not going to make any changes at this time. - Daily controller medication(s): Trelegy 200/62.5/25 one puff once daily  - Prior to physical activity: albuterol 2 puffs 10-15 minutes before physical activity. - Rescue medications: albuterol 4 puffs every 4-6 hours as needed - Asthma control goals:  * Full participation in all desired activities (may need albuterol before activity) * Albuterol use two time or less a week on average (not counting use with activity) * Cough interfering with sleep two time or less a month * Oral steroids no more than once a year * No hospitalizations  2. Chronic rhinitis (dust mites, cat, dog, grasses, molds, trees, and ragweed) - Continue with the Zyrtec (cetirizine) 10mg  daily.  - Continue with Flonase one spray per nostril daily. - Continue with Astelin one spray per nostril daily.  3. 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 - No further episodes. - We will continue to monitor.   4. Return in about 6 months (around 04/19/2022).    Please inform us of any Emergency Department visits, hospitalizations, or changes in symptoms. Call us before going to the ED for breathing or allergy symptoms since we might be able to fit you in for a sick visit. Feel free to contact us anytime with any questions, problems, or concerns.  It was a pleasure to see you again today!   Websites that have reliable patient information: 1. American Academy of Asthma, Allergy, and Immunology: www.aaaai.org 2. Food Allergy Research and Education (FARE): foodallergy.org 3. Mothers of Asthmatics: http://www.asthmacommunitynetwork.org 4. American College of Allergy, Asthma, and Immunology: www.acaai.org   COVID-19 Vaccine Information can be found at: ShippingScam.co.uk For  questions related to vaccine distribution or appointments, please email [email or web address] or call [phone].   We realize that you might be concerned about having an allergic reaction to the COVID19 vaccines. To help with that concern, WE ARE OFFERING THE COVID19 VACCINES IN OUR OFFICE! Ask the front desk for dates!     Like [place] on [employer] and Instagram for our latest updates!      A healthy democracy works best when [employer] participate! Make sure you are registered to vote! If you have moved or changed any of your contact information, you will need to get this updated before voting!  In some cases, you MAY be able to register to vote online: [email or web address]

## 2021-11-01 ENCOUNTER — Encounter: Payer: Self-pay | Admitting: Allergy & Immunology

## 2021-11-02 NOTE — Telephone Encounter (Signed)
Can you please find out some specific symptoms such as when did this shortness of breath start? Constant or intermittent? Sputum? Fever? Current medications he has tried? Thank you

## 2021-11-03 NOTE — Telephone Encounter (Signed)
Webb Silversmith called and wanted this sent to dr gallaher.

## 2021-11-04 ENCOUNTER — Ambulatory Visit (INDEPENDENT_AMBULATORY_CARE_PROVIDER_SITE_OTHER): Payer: Medicare Other

## 2021-11-04 ENCOUNTER — Ambulatory Visit (INDEPENDENT_AMBULATORY_CARE_PROVIDER_SITE_OTHER): Payer: Medicare Other | Admitting: Family Medicine

## 2021-11-04 ENCOUNTER — Encounter: Payer: Self-pay | Admitting: Family Medicine

## 2021-11-04 ENCOUNTER — Other Ambulatory Visit: Payer: Self-pay

## 2021-11-04 ENCOUNTER — Telehealth: Payer: Self-pay | Admitting: Family Medicine

## 2021-11-04 VITALS — BP 142/86 | HR 105 | Temp 98.6°F | Ht 72.0 in | Wt 216.0 lb

## 2021-11-04 DIAGNOSIS — E291 Testicular hypofunction: Secondary | ICD-10-CM

## 2021-11-04 DIAGNOSIS — I10 Essential (primary) hypertension: Secondary | ICD-10-CM

## 2021-11-04 DIAGNOSIS — J4541 Moderate persistent asthma with (acute) exacerbation: Secondary | ICD-10-CM

## 2021-11-04 DIAGNOSIS — E890 Postprocedural hypothyroidism: Secondary | ICD-10-CM

## 2021-11-04 DIAGNOSIS — J8410 Pulmonary fibrosis, unspecified: Secondary | ICD-10-CM | POA: Diagnosis not present

## 2021-11-04 DIAGNOSIS — F5104 Psychophysiologic insomnia: Secondary | ICD-10-CM

## 2021-11-04 MED ORDER — PREDNISONE 20 MG PO TABS: ORAL_TABLET | ORAL | 0 refills | Status: AC

## 2021-11-04 MED ORDER — QUVIVIQ 25 MG PO TABS: 25.0000 mg | ORAL_TABLET | Freq: Every evening | ORAL | 2 refills | Status: DC | PRN

## 2021-11-04 NOTE — Progress Notes (Signed)
°[facility] °[facility] °[address] °[place] [address] °Dept: [phone] °Dept Fax: [phone] ° °Office Visit ° °Subjective:  ° ° Patient ID: Troy [name], male    DOB: 04/23/1976, 46 y.o..   MRN: [id number] ° °Chief Complaint  °Patient presents with  ° Follow-up  °  3 month f/u.   C/o having SOB, cough with dark phlegm, low grade fever x 1 1/2 weeks.  No OTC meds taken.  Neg covid test on 10/31/21  ° ° °History of Present Illness: ° °Patient is in today for routine follow-up of his chronic issues. However, he notes that he has had about a 10-day increase in respiratory issues. He states he has been tight chested. He has been coughing up dark colored sputum. He notes his temp has been up to 100.1° F (2 days ago). Mr. Kuroda has a historyof moderate persistent asthma and pulmonary fibrosis. He is managed on albuterol and Trelegy. ° °Mr. Frye has a history of hypertension. He is managed on verapamil and lisinopril. He did take some Sudafed this morning, so he thinks this impacted his BP today. °  °Mr. Thome has a past history of thyroid cancer. He is currently hypothyroid s/p thyroid resection. He had his TSH checked earlier this month. His thyroid medication is managed by his endocrinologist, who added in an additional dose on Sundays. °  °Mr. Richoux has a long-standing history of insomnia. He uses hydroxyzine, trazadone, melatonin and Zaleplon (Sonata) to help him sleep. He notes that none of this has really helped with his sleep. He has struggled with this since childhood. He has focused on sleep hygiene improvements. He has used multiple OTC and herbal approaches without improvement. He asks about Quviviq as a possible approach. 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  ° Genetic  testing 06/02/2021  ° Family history of breast cancer 05/19/2021  ° History of splenectomy 02/02/2021  ° 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  ° Erectile dysfunction 02/02/2021  ° 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  ° Pulmonary fibrosis (HCC) 02/02/2021  ° Peripheral neuropathy 02/02/2021  ° History of hepatitis C 02/02/2021  ° Back pain 02/02/2021  ° Hyperlipidemia 02/02/2021  ° Moderate persistent asthma, uncomplicated 08/13/2020  ° History of Hodgkin's lymphoma 08/13/2020  ° Chronic rhinitis 08/13/2020  ° Not currently working due to disabled status 08/13/2020  ° Pain in joint of left knee 07/25/2020  ° History of thyroid cancer 06/09/2020  ° Arthralgia of left elbow 05/01/2020  ° Autonomic dysfunction 04/23/2020  ° Iron deficiency anemia due to chronic blood loss 01/18/2020  ° Microalbuminuria 01/18/2020  ° Chronic insomnia 01/17/2020  ° Large liver 01/17/2020  ° Prediabetes 01/11/2019  ° Daytime somnolence 07/22/2017  ° History of basal cell carcinoma (BCC) 11/30/2016  ° GERD (gastroesophageal reflux disease) 08/25/2016  ° Essential hypertension 08/25/2016  ° Substance induced mood disorder (HCC)   ° History of stroke   ° °Past Surgical History:  °Procedure Laterality Date  ° COLONOSCOPY  07/04/2019  ° done for hematochezia and FH col ca-mother: Mod int hem w/inflamm.  No adenomatous polyps.  ° INCISIONAL HERNIA REPAIR  06/2019  ° RUQ (prev lap append site)  ° LAPAROSCOPIC APPENDECTOMY N/A 03/25/2017  ° Procedure: APPENDECTOMY LAPAROSCOPIC; EXTENSIVE LYSIS OF ADHESIONS FROM PRIOR RADIATION AND SURGERY;  Surgeon: Martin, Matthew, MD;  Location: [facility]   °[facility] °[facility] °[address] °[place] [address] °Dept: [phone] °Dept Fax: [phone] ° °Office Visit ° °Subjective:  ° ° Patient ID: Troy [name], male    DOB: 04/23/1976, 46 y.o..   MRN: [id number] ° °Chief Complaint  °Patient presents with  ° Follow-up  °  3 month f/u.   C/o having SOB, cough with dark phlegm, low grade fever x 1 1/2 weeks.  No OTC meds taken.  Neg covid test on 10/31/21  ° ° °History of Present Illness: ° °Patient is in today for routine follow-up of his chronic issues. However, he notes that he has had about a 10-day increase in respiratory issues. He states he has been tight chested. He has been coughing up dark colored sputum. He notes his temp has been up to 100.1° F (2 days ago). Mr. Kuroda has a historyof moderate persistent asthma and pulmonary fibrosis. He is managed on albuterol and Trelegy. ° °Mr. Frye has a history of hypertension. He is managed on verapamil and lisinopril. He did take some Sudafed this morning, so he thinks this impacted his BP today. °  °Mr. Thome has a past history of thyroid cancer. He is currently hypothyroid s/p thyroid resection. He had his TSH checked earlier this month. His thyroid medication is managed by his endocrinologist, who added in an additional dose on Sundays. °  °Mr. Richoux has a long-standing history of insomnia. He uses hydroxyzine, trazadone, melatonin and Zaleplon (Sonata) to help him sleep. He notes that none of this has really helped with his sleep. He has struggled with this since childhood. He has focused on sleep hygiene improvements. He has used multiple OTC and herbal approaches without improvement. He asks about Quviviq as a possible approach. 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  ° Genetic  testing 06/02/2021  ° Family history of breast cancer 05/19/2021  ° History of splenectomy 02/02/2021  ° 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  ° Erectile dysfunction 02/02/2021  ° 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  ° Pulmonary fibrosis (HCC) 02/02/2021  ° Peripheral neuropathy 02/02/2021  ° History of hepatitis C 02/02/2021  ° Back pain 02/02/2021  ° Hyperlipidemia 02/02/2021  ° Moderate persistent asthma, uncomplicated 08/13/2020  ° History of Hodgkin's lymphoma 08/13/2020  ° Chronic rhinitis 08/13/2020  ° Not currently working due to disabled status 08/13/2020  ° Pain in joint of left knee 07/25/2020  ° History of thyroid cancer 06/09/2020  ° Arthralgia of left elbow 05/01/2020  ° Autonomic dysfunction 04/23/2020  ° Iron deficiency anemia due to chronic blood loss 01/18/2020  ° Microalbuminuria 01/18/2020  ° Chronic insomnia 01/17/2020  ° Large liver 01/17/2020  ° Prediabetes 01/11/2019  ° Daytime somnolence 07/22/2017  ° History of basal cell carcinoma (BCC) 11/30/2016  ° GERD (gastroesophageal reflux disease) 08/25/2016  ° Essential hypertension 08/25/2016  ° Substance induced mood disorder (HCC)   ° History of stroke   ° °Past Surgical History:  °Procedure Laterality Date  ° COLONOSCOPY  07/04/2019  ° done for hematochezia and FH col ca-mother: Mod int hem w/inflamm.  No adenomatous polyps.  ° INCISIONAL HERNIA REPAIR  06/2019  ° RUQ (prev lap append site)  ° LAPAROSCOPIC APPENDECTOMY N/A 03/25/2017  ° Procedure: APPENDECTOMY LAPAROSCOPIC; EXTENSIVE LYSIS OF ADHESIONS FROM PRIOR RADIATION AND SURGERY;  Surgeon: Martin, Matthew, MD;  Location: [facility]   °[facility] °[facility] °[address] °[place] [address] °Dept: [phone] °Dept Fax: [phone] ° °Office Visit ° °Subjective:  ° ° Patient ID: Troy [name], male    DOB: 04/23/1976, 46 y.o..   MRN: [id number] ° °Chief Complaint  °Patient presents with  ° Follow-up  °  3 month f/u.   C/o having SOB, cough with dark phlegm, low grade fever x 1 1/2 weeks.  No OTC meds taken.  Neg covid test on 10/31/21  ° ° °History of Present Illness: ° °Patient is in today for routine follow-up of his chronic issues. However, he notes that he has had about a 10-day increase in respiratory issues. He states he has been tight chested. He has been coughing up dark colored sputum. He notes his temp has been up to 100.1° F (2 days ago). Mr. Kuroda has a historyof moderate persistent asthma and pulmonary fibrosis. He is managed on albuterol and Trelegy. ° °Mr. Frye has a history of hypertension. He is managed on verapamil and lisinopril. He did take some Sudafed this morning, so he thinks this impacted his BP today. °  °Mr. Thome has a past history of thyroid cancer. He is currently hypothyroid s/p thyroid resection. He had his TSH checked earlier this month. His thyroid medication is managed by his endocrinologist, who added in an additional dose on Sundays. °  °Mr. Richoux has a long-standing history of insomnia. He uses hydroxyzine, trazadone, melatonin and Zaleplon (Sonata) to help him sleep. He notes that none of this has really helped with his sleep. He has struggled with this since childhood. He has focused on sleep hygiene improvements. He has used multiple OTC and herbal approaches without improvement. He asks about Quviviq as a possible approach. 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  ° Genetic  testing 06/02/2021  ° Family history of breast cancer 05/19/2021  ° History of splenectomy 02/02/2021  ° 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  ° Erectile dysfunction 02/02/2021  ° 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  ° Pulmonary fibrosis (HCC) 02/02/2021  ° Peripheral neuropathy 02/02/2021  ° History of hepatitis C 02/02/2021  ° Back pain 02/02/2021  ° Hyperlipidemia 02/02/2021  ° Moderate persistent asthma, uncomplicated 08/13/2020  ° History of Hodgkin's lymphoma 08/13/2020  ° Chronic rhinitis 08/13/2020  ° Not currently working due to disabled status 08/13/2020  ° Pain in joint of left knee 07/25/2020  ° History of thyroid cancer 06/09/2020  ° Arthralgia of left elbow 05/01/2020  ° Autonomic dysfunction 04/23/2020  ° Iron deficiency anemia due to chronic blood loss 01/18/2020  ° Microalbuminuria 01/18/2020  ° Chronic insomnia 01/17/2020  ° Large liver 01/17/2020  ° Prediabetes 01/11/2019  ° Daytime somnolence 07/22/2017  ° History of basal cell carcinoma (BCC) 11/30/2016  ° GERD (gastroesophageal reflux disease) 08/25/2016  ° Essential hypertension 08/25/2016  ° Substance induced mood disorder (HCC)   ° History of stroke   ° °Past Surgical History:  °Procedure Laterality Date  ° COLONOSCOPY  07/04/2019  ° done for hematochezia and FH col ca-mother: Mod int hem w/inflamm.  No adenomatous polyps.  ° INCISIONAL HERNIA REPAIR  06/2019  ° RUQ (prev lap append site)  ° LAPAROSCOPIC APPENDECTOMY N/A 03/25/2017  ° Procedure: APPENDECTOMY LAPAROSCOPIC; EXTENSIVE LYSIS OF ADHESIONS FROM PRIOR RADIATION AND SURGERY;  Surgeon: Martin, Matthew, MD;  Location: [facility]   °[facility] °[facility] °[address] °[place] [address] °Dept: [phone] °Dept Fax: [phone] ° °Office Visit ° °Subjective:  ° ° Patient ID: Troy [name], male    DOB: 04/23/1976, 46 y.o..   MRN: [id number] ° °Chief Complaint  °Patient presents with  ° Follow-up  °  3 month f/u.   C/o having SOB, cough with dark phlegm, low grade fever x 1 1/2 weeks.  No OTC meds taken.  Neg covid test on 10/31/21  ° ° °History of Present Illness: ° °Patient is in today for routine follow-up of his chronic issues. However, he notes that he has had about a 10-day increase in respiratory issues. He states he has been tight chested. He has been coughing up dark colored sputum. He notes his temp has been up to 100.1° F (2 days ago). Mr. Kuroda has a historyof moderate persistent asthma and pulmonary fibrosis. He is managed on albuterol and Trelegy. ° °Mr. Frye has a history of hypertension. He is managed on verapamil and lisinopril. He did take some Sudafed this morning, so he thinks this impacted his BP today. °  °Mr. Thome has a past history of thyroid cancer. He is currently hypothyroid s/p thyroid resection. He had his TSH checked earlier this month. His thyroid medication is managed by his endocrinologist, who added in an additional dose on Sundays. °  °Mr. Richoux has a long-standing history of insomnia. He uses hydroxyzine, trazadone, melatonin and Zaleplon (Sonata) to help him sleep. He notes that none of this has really helped with his sleep. He has struggled with this since childhood. He has focused on sleep hygiene improvements. He has used multiple OTC and herbal approaches without improvement. He asks about Quviviq as a possible approach. 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  ° Genetic  testing 06/02/2021  ° Family history of breast cancer 05/19/2021  ° History of splenectomy 02/02/2021  ° 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  ° Erectile dysfunction 02/02/2021  ° 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  ° Pulmonary fibrosis (HCC) 02/02/2021  ° Peripheral neuropathy 02/02/2021  ° History of hepatitis C 02/02/2021  ° Back pain 02/02/2021  ° Hyperlipidemia 02/02/2021  ° Moderate persistent asthma, uncomplicated 08/13/2020  ° History of Hodgkin's lymphoma 08/13/2020  ° Chronic rhinitis 08/13/2020  ° Not currently working due to disabled status 08/13/2020  ° Pain in joint of left knee 07/25/2020  ° History of thyroid cancer 06/09/2020  ° Arthralgia of left elbow 05/01/2020  ° Autonomic dysfunction 04/23/2020  ° Iron deficiency anemia due to chronic blood loss 01/18/2020  ° Microalbuminuria 01/18/2020  ° Chronic insomnia 01/17/2020  ° Large liver 01/17/2020  ° Prediabetes 01/11/2019  ° Daytime somnolence 07/22/2017  ° History of basal cell carcinoma (BCC) 11/30/2016  ° GERD (gastroesophageal reflux disease) 08/25/2016  ° Essential hypertension 08/25/2016  ° Substance induced mood disorder (HCC)   ° History of stroke   ° °Past Surgical History:  °Procedure Laterality Date  ° COLONOSCOPY  07/04/2019  ° done for hematochezia and FH col ca-mother: Mod int hem w/inflamm.  No adenomatous polyps.  ° INCISIONAL HERNIA REPAIR  06/2019  ° RUQ (prev lap append site)  ° LAPAROSCOPIC APPENDECTOMY N/A 03/25/2017  ° Procedure: APPENDECTOMY LAPAROSCOPIC; EXTENSIVE LYSIS OF ADHESIONS FROM PRIOR RADIATION AND SURGERY;  Surgeon: Martin, Matthew, MD;  Location: [facility]

## 2021-11-04 NOTE — Telephone Encounter (Signed)
Called this patient to discuss his breathing, however, he was at his PCP appointment. He reports that he will call back when he is finished at his PCP appointment.

## 2021-11-09 ENCOUNTER — Telehealth: Payer: Self-pay

## 2021-11-09 ENCOUNTER — Encounter: Payer: Self-pay | Admitting: Family Medicine

## 2021-11-09 DIAGNOSIS — F5104 Psychophysiologic insomnia: Secondary | ICD-10-CM

## 2021-11-09 NOTE — Telephone Encounter (Signed)
PA for Quviviq 25 mg submitted to [employer].  Awaiting on response.  Dm/cma  Key: [id number]

## 2021-11-10 MED ORDER — BELSOMRA 10 MG PO TABS: 10.0000 mg | ORAL_TABLET | Freq: Every evening | ORAL | 2 refills | Status: DC | PRN

## 2021-11-10 NOTE — Telephone Encounter (Signed)
Received a denial for Quviviq from [facility].  He must try one of the covered drugs: 1) Belsomra 2) Ramelteon  Or an appeal will have to be done to show why one of the others is not appropriate for patient.   Please review and advise.   Dm/cma

## 2021-11-10 NOTE — Telephone Encounter (Signed)
Patient notified VIA phone and will let us know if any problems. Dm/cma

## 2021-11-10 NOTE — Addendum Note (Signed)
Addended by: Haydee Salter on: 11/10/2021 01:16 PM   Modules accepted: Orders

## 2021-12-06 ENCOUNTER — Other Ambulatory Visit: Payer: Self-pay | Admitting: Family Medicine

## 2021-12-06 DIAGNOSIS — F5104 Psychophysiologic insomnia: Secondary | ICD-10-CM

## 2021-12-06 DIAGNOSIS — I10 Essential (primary) hypertension: Secondary | ICD-10-CM

## 2021-12-07 NOTE — Telephone Encounter (Signed)
Refill request for: Trazadone 300 mg LR 02/02/21, #90, 3 rfs LOV  11/04/21 FOV 02/03/22  Please review and advise. Thanks. Dm/cma

## 2021-12-18 ENCOUNTER — Other Ambulatory Visit: Payer: Self-pay | Admitting: Allergy & Immunology

## 2021-12-21 ENCOUNTER — Other Ambulatory Visit: Payer: Self-pay | Admitting: *Deleted

## 2021-12-21 MED ORDER — ALBUTEROL SULFATE HFA 108 (90 BASE) MCG/ACT IN AERS: 2.0000 | INHALATION_SPRAY | Freq: Four times a day (QID) | RESPIRATORY_TRACT | 1 refills | Status: DC | PRN

## 2021-12-31 ENCOUNTER — Encounter: Payer: Self-pay | Admitting: Family Medicine

## 2021-12-31 DIAGNOSIS — F5104 Psychophysiologic insomnia: Secondary | ICD-10-CM

## 2022-01-01 ENCOUNTER — Encounter: Payer: Self-pay | Admitting: Family Medicine

## 2022-01-01 DIAGNOSIS — F5104 Psychophysiologic insomnia: Secondary | ICD-10-CM

## 2022-01-01 MED ORDER — QUVIVIQ 25 MG PO TABS: 25.0000 mg | ORAL_TABLET | Freq: Every evening | ORAL | 3 refills | Status: DC

## 2022-01-01 MED ORDER — TRAZODONE HCL 150 MG PO TABS: 300.0000 mg | ORAL_TABLET | Freq: Every day | ORAL | 3 refills | Status: DC

## 2022-01-01 NOTE — Addendum Note (Signed)
Addended by: Haydee Salter on: 01/01/2022 01:07 PM ? ? Modules accepted: Orders ? ?

## 2022-01-06 ENCOUNTER — Telehealth: Payer: Self-pay

## 2022-01-06 NOTE — Telephone Encounter (Signed)
Tried to do another PA for Quviviq 25 mg, was canceled due to prior denial. Called the Appeals # [id number], spoke to Katharine Look and will be up to a 7 day turnaround to get and answer.   ?REF#  [id number] ? ?Can call [facility] care for updates.   Dm/cma ? ?

## 2022-01-17 ENCOUNTER — Other Ambulatory Visit: Payer: Self-pay | Admitting: Family Medicine

## 2022-01-17 DIAGNOSIS — E785 Hyperlipidemia, unspecified: Secondary | ICD-10-CM

## 2022-01-17 DIAGNOSIS — K219 Gastro-esophageal reflux disease without esophagitis: Secondary | ICD-10-CM

## 2022-01-18 NOTE — Telephone Encounter (Signed)
Appeal was approved from 11/09/21 - 10/10/22.  Patient notified VIA phone and had already picked up RX.  Dm/cma ? ?

## 2022-01-27 DIAGNOSIS — M25511 Pain in right shoulder: Secondary | ICD-10-CM | POA: Diagnosis not present

## 2022-01-27 DIAGNOSIS — M25512 Pain in left shoulder: Secondary | ICD-10-CM | POA: Diagnosis not present

## 2022-02-03 ENCOUNTER — Ambulatory Visit (INDEPENDENT_AMBULATORY_CARE_PROVIDER_SITE_OTHER): Payer: Medicare Other | Admitting: Family Medicine

## 2022-02-03 VITALS — BP 162/76 | HR 93 | Temp 98.7°F | Wt 221.4 lb

## 2022-02-03 DIAGNOSIS — I10 Essential (primary) hypertension: Secondary | ICD-10-CM

## 2022-02-03 DIAGNOSIS — E291 Testicular hypofunction: Secondary | ICD-10-CM | POA: Diagnosis not present

## 2022-02-03 DIAGNOSIS — F5104 Psychophysiologic insomnia: Secondary | ICD-10-CM | POA: Diagnosis not present

## 2022-02-03 MED ORDER — LISINOPRIL 40 MG PO TABS: 20.0000 mg | ORAL_TABLET | Freq: Every day | ORAL | 3 refills | Status: DC

## 2022-02-03 NOTE — Progress Notes (Signed)
?[facility] ?[facility] ?[address] ?[place] [address] ?Dept: [phone] ?Dept Fax: [phone] ? ?Chronic Care Office Visit ? ?Subjective:  ? ? Patient ID: Troy [name], male    DOB: [birth year], 46 y.o..   MRN: [id number] ? ?Chief Complaint  ?Patient presents with  ? Follow-up  ?  3 month f/u. C/o having fluctuation in BP readings and not sleeping.    ? ? ?History of Present Illness: ? ?Patient is in today for reassessment of chronic medical issues. ? ?Troy [name] has a history of hypertension. He is managed on verapamil and lisinopril. He notes that his blood pressures have been running more consistently high over the past weeks to months. He is unsure of what might be bringing this about. Troy [name] has a history of having had strokes involving both hemispheres. 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 The strokes occurred in 2016. At the time, he had fairly profound right hemiparesis, which has since resolved.  ? ?Troy [name] has a history of hypogonadism and erectile dysfunction. He is currently treated with testosterone injections under the care of Dr. Patsi Sears (urology). He is on 100 mg injection once a week. ?  ?Troy [name] has a long-standing history of insomnia. He uses hydroxyzine, trazadone, melatonin, GABA, and tryptophan. He had been on zaleplon (Sonata) to help him sleep. He notes that none of this has really helped with his sleep. He has struggled with this since childhood. We had him take a trial of suvorexant (Belsomra), but this did not seem to help. We then tried daridorexant Lennox Solders). Apparently, he took both of these last two medications at the same time that he was still using Sonata. He states he had used the Sonata mainly for taking naps. He describes profound daytime fatigue and drowsiness. ? ?Past Medical History: ?Patient Active Problem List  ? Diagnosis Date Noted  ? Osteoarthritis of acromioclavicular joint  10/15/2021  ? 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  ? 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 History of splenectomy 02/02/2021  ? 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  ? Erectile dysfunction 02/02/2021  ? 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  ? Pulmonary fibrosis (HCC) 02/02/2021  ? Peripheral neuropathy 02/02/2021  ? History of hepatitis C 02/02/2021  ? Back pain 02/02/2021  ? Hyperlipidemia 02/02/2021  ? Moderate persistent asthma, uncomplicated 08/13/2020  ? History of Hodgkin's lymphoma 08/13/2020  ? Chronic rhinitis 08/13/2020  ? Not currently working due to disabled status 08/13/2020  ? Pain in joint of left knee 07/25/2020  ? History of thyroid cancer 06/09/2020  ? Arthralgia of left elbow 05/01/2020  ? Autonomic dysfunction 04/23/2020  ? Iron deficiency anemia due to chronic blood loss 01/18/2020  ? Microalbuminuria 01/18/2020  ? Chronic insomnia 01/17/2020  ? Large liver 01/17/2020  ? Prediabetes 01/11/2019  ? Daytime somnolence 07/22/2017  ? History of basal cell carcinoma (BCC) 11/30/2016  ? GERD (gastroesophageal reflux disease) 08/25/2016  ? Essential hypertension 08/25/2016  ? Substance induced mood disorder (HCC)   ? History of stroke   ? ?Past Surgical History:  ?Procedure Laterality Date  ? COLONOSCOPY  07/04/2019  ? done for hematochezia and FH col ca-mother: Mod int hem w/inflamm.  No adenomatous polyps.  ? INCISIONAL HERNIA REPAIR  06/2019  ? RUQ (prev lap append site)  ? LAPAROSCOPIC APPENDECTOMY N/A 03/25/2017  ? Procedure: APPENDECTOMY LAPAROSCOPIC; EXTENSIVE LYSIS OF ADHESIONS FROM PRIOR RADIATION AND SURGERY;  Surgeon: Luretha Murphy, MD;  Location: [facility];  Service: General;  Laterality: N/A;  ? PARATHYROIDECTOMY    ? SPLENECTOMY    ? TEE WITHOUT CARDIOVERSION N/A 07/18/2015  ? Procedure: TRANSESOPHAGEAL ECHOCARDIOGRAM (TEE);  Surgeon: [name], MD;  Location: [facility] ENDOSCOPY;  Service: Cardiovascular;  Laterality: N/A;  ? THYROIDECTOMY    ? ?Family History  ?Problem Relation Age of  Onset  ? Breast cancer Mother   ?     dx early 92s  ? Colon cancer Mother   ?     dx early 70s  ? Other Mother   ?     NTHL1 homozygous  ? Multiple myeloma Father   ?     dx 36s  ? Breast cancer Maternal Aunt   ? Stroke Maternal Grandmother   ? Leukemia Other   ?     dx <20; two paternal first cousins once removed  ? ?Outpatient Medications Prior to Visit  ?Medication Sig Dispense Refill  ? albuterol (PROVENTIL) (5 MG/ML) 0.5% nebulizer solution     ? albuterol (VENTOLIN HFA) 108 (90 Base) MCG/ACT inhaler INHALE 2 PUFFS BY MOUTH EVERY 6 HOURS AS NEEDED FOR SHORTNESS OF BREATH 9 g 0  ? albuterol (VENTOLIN HFA) 108 (90 Base) MCG/ACT inhaler Inhale 2 puffs into the lungs every 6 (six) hours as needed. For shortness of breath. 18 g 1  ? ammonium lactate (LAC-HYDRIN) 12 % lotion Apply 1 application topically as needed for dry skin.    ? aspirin 325 MG tablet Take 1 tablet (325 mg total) by mouth daily. 30 tablet 0  ? azelastine (ASTELIN) 0.1 % nasal spray Place 1 spray into both nostrils daily. Use in each nostril as directed 30 mL 5  ? baclofen (LIORESAL) 10 MG tablet Take 1 tablet by mouth three times daily as needed 30 tablet 5  ? Daridorexant HCl (QUVIVIQ) 25 MG TABS Take 25 mg by mouth at bedtime. 30 tablet 3  ? EPINEPHrine 0.3 mg/0.3 mL IJ SOAJ injection Inject into the muscle.    ? esomeprazole (NEXIUM) 40 MG capsule TAKE 1 CAPSULE BY MOUTH TWICE DAILY BEFORE A MEAL 180 capsule 3  ? EUTHYROX 200 MCG tablet Take 200 mcg by mouth daily.    ? ferrous sulfate (FERROUSUL) 325 (65 FE) MG tablet Take 1 tablet (325 mg total) by mouth 2 (two) times daily with a meal. 180 tablet 1  ? fluticasone (FLONASE) 50 MCG/ACT nasal spray Place 1 spray into both nostrils daily. 9.9 mL 6  ? gabapentin (NEURONTIN) 300 MG capsule TAKE 4 CAPSULES BY MOUTH IN THE MORNING AND 2 CAPSULES AT NOON AND 2 CAPSULES AT BEDTIME 240 capsule 1  ? HYDROcodone-acetaminophen (NORCO/VICODIN) 5-325 MG tablet Norco 5 mg-325 mg tablet ? Take 1 tablet every  6 hours by oral route.    ? hydrOXYzine (VISTARIL) 50 MG capsule Take 1 capsule (50 mg total) by mouth at bedtime. 90 capsule 3  ? LUER LOCK SAFETY SYRINGES 22G X 1-1/2" 3 ML MISC USE FOR SELF ADMINISTER OF TESTOSTERONE 2CC ONCE EVERY 2 WEEKS    ? rosuvastatin (CRESTOR) 40 MG tablet Take 1 tablet by mouth once daily 90 tablet 3  ? testosterone cypionate (DEPOTESTOTERONE CYPIONATE) 100 MG/ML injection Inject 100 mg into the muscle every 14 (fourteen) days. For IM use only    ? traZODone (DESYREL) 100 MG tablet Take 1 tablet (100 mg total) by mouth at bedtime. Take together with trazodone 300 mg at bedtime (total of 400 mg daily) 90 tablet 3  ? TRELEGY ELLIPTA  200-62.5-25 MCG/ACT AEPB Inhale 1 puff into the lungs daily. 60 each 5  ? verapamil (VERELAN PM) 180 MG 24 hr capsule Take 1 capsule (180 mg total) by mouth at bedtime. 90 capsule 3  ? lisinopril (ZESTRIL) 20 MG tablet Take 1 tablet by mouth once daily 90 tablet 0  ? Suvorexant (BELSOMRA) 10 MG TABS Take 10 mg by mouth at bedtime as needed. Take within 30 min. of intended bedtime. 30 tablet 2  ? trazodone (DESYREL) 150 MG tablet Take 2 tablets (300 mg total) by mouth at bedtime. 180 tablet 3  ? ?Facility-Administered Medications Prior to Visit  ?Medication Dose Route Frequency Provider Last Rate Last Admin  ? Mepolizumab SOLR 100 mg  100 mg Subcutaneous Q28 days Alfonse Spruce, MD   100 mg at 07/08/21 1108  ? omalizumab Geoffry Paradise) injection 300 mg  300 mg Subcutaneous Q14 Days Ellamae Sia, DO   300 mg at 08/05/21 1655  ? ?Allergies  ?Allergen Reactions  ? Ciprofloxacin Other (See Comments)  ?  Spontaneous rupture of tendon  ? Earnestine Leys Officinalis] Anaphylaxis and Hives  ? Fluocinolone Other (See Comments)  ?  "spontaneous rupture of tendons" per pt dr.  ? ?Objective:  ? ?Today's Vitals  ? 02/03/22 1108 02/03/22 1111  ?BP: (!) 160/72 (!) 162/76  ?Pulse: 93   ?Temp: 98.7 ?F (37.1 ?C)   ?TempSrc: Temporal   ?SpO2: 96%   ?Weight: 221 lb 6.4 oz (100.4 kg)    ? ?Body mass index is 30.03 kg/m?.  ? ?General: Well developed, well nourished. No acute distress. ?Psych: Alert and oriented. Normal mood and affect. ? ?There are no preventive care reminders to [facility]

## 2022-02-04 DIAGNOSIS — M25511 Pain in right shoulder: Secondary | ICD-10-CM | POA: Diagnosis not present

## 2022-02-05 DIAGNOSIS — D489 Neoplasm of uncertain behavior, unspecified: Secondary | ICD-10-CM | POA: Diagnosis not present

## 2022-02-05 DIAGNOSIS — R21 Rash and other nonspecific skin eruption: Secondary | ICD-10-CM | POA: Diagnosis not present

## 2022-02-10 ENCOUNTER — Encounter: Payer: Self-pay | Admitting: Family Medicine

## 2022-02-11 DIAGNOSIS — M25511 Pain in right shoulder: Secondary | ICD-10-CM | POA: Diagnosis not present

## 2022-02-15 ENCOUNTER — Encounter: Payer: Self-pay | Admitting: Family Medicine

## 2022-02-16 DIAGNOSIS — M19111 Post-traumatic osteoarthritis, right shoulder: Secondary | ICD-10-CM | POA: Diagnosis not present

## 2022-02-18 DIAGNOSIS — D489 Neoplasm of uncertain behavior, unspecified: Secondary | ICD-10-CM | POA: Diagnosis not present

## 2022-02-21 ENCOUNTER — Encounter: Payer: Self-pay | Admitting: Family Medicine

## 2022-02-22 ENCOUNTER — Encounter: Payer: Self-pay | Admitting: Family Medicine

## 2022-02-22 DIAGNOSIS — Z86007 Personal history of in-situ neoplasm of skin: Secondary | ICD-10-CM | POA: Insufficient documentation

## 2022-03-03 ENCOUNTER — Ambulatory Visit (INDEPENDENT_AMBULATORY_CARE_PROVIDER_SITE_OTHER): Payer: Medicare Other | Admitting: Family Medicine

## 2022-03-03 ENCOUNTER — Encounter: Payer: Self-pay | Admitting: Family Medicine

## 2022-03-03 VITALS — BP 140/78 | HR 105 | Temp 97.0°F | Wt 215.2 lb

## 2022-03-03 DIAGNOSIS — D099 Carcinoma in situ, unspecified: Secondary | ICD-10-CM | POA: Diagnosis not present

## 2022-03-03 DIAGNOSIS — L729 Follicular cyst of the skin and subcutaneous tissue, unspecified: Secondary | ICD-10-CM | POA: Diagnosis not present

## 2022-03-03 DIAGNOSIS — M75101 Unspecified rotator cuff tear or rupture of right shoulder, not specified as traumatic: Secondary | ICD-10-CM | POA: Insufficient documentation

## 2022-03-03 DIAGNOSIS — I10 Essential (primary) hypertension: Secondary | ICD-10-CM

## 2022-03-03 DIAGNOSIS — F5104 Psychophysiologic insomnia: Secondary | ICD-10-CM | POA: Diagnosis not present

## 2022-03-03 MED ORDER — LISINOPRIL 40 MG PO TABS: 40.0000 mg | ORAL_TABLET | Freq: Every day | ORAL | 3 refills | Status: DC

## 2022-03-03 NOTE — Progress Notes (Signed)
[facility] [address] [place] [address] Dept: [phone] Dept Fax: [phone]  Office Visit  Subjective:    Patient ID: Troy [name], male    DOB: [birth year], 46 y.o..   MRN: [id number]  Chief Complaint  Patient presents with   Follow-up    4 week f/u.      History of Present Illness:  Patient is in today for evaluation of his insomnia and hypertension.   Mr. Staker has a history of essential hypertension. He is managed on verapamil and lisinopril. At his last visit in April, he noted that his blood pressures have been running more consistently high over the past weeks to months. Mr. 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 The strokes occurred in 2016. At the time, he had fairly profound right hemiparesis, which has since resolved. At his last visit, we increased his lisinopril to 40 mg daily. He notes that he has noted a pattern of his BP surging after he takes his testosterone injections. He also recently received a Toradol injection from the orthopedist, which also drove up his pressure.  Mr. Causby has a long-standing history of insomnia. He had been using hydroxyzine, trazodone 300 mg, melatonin, GABA, and tryptophan. We had also tried him on zaleplon (Sonata) and suvorexant (Belsomra). He noted that none of this has really helped with his sleep. He has struggled with this since childhood. More recently, we tried daridorexant Lennox Solders). He had also been taking some of these in combination or certain ones for napping. At his last visit, we discussed the the medications he is taking may have been working against each other or worsening his sleep issues. Since then he has stopped taking hydroxyzine every day, stopped tryptophan, and reduced this trazodone, now down to 150 mg a day. He is still taking Quviviq. He has not noted an  improvement in his sleep, but states it is not worse either.  Mr. Perrin recently saw the dermatologist about a new lesion near the base of his penis. 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 He is being managed with topical 5-FU cream.  He also notes he found a new lump on his right flank. This is nontender.  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   History of splenectomy 02/02/2021   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   Erectile dysfunction 02/02/2021   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   Pulmonary fibrosis (HCC) 02/02/2021   Peripheral neuropathy 02/02/2021   History of hepatitis C 02/02/2021   Back pain 02/02/2021   Hyperlipidemia 02/02/2021   Moderate persistent asthma, uncomplicated 08/13/2020   History of Hodgkin's lymphoma 08/13/2020   Chronic rhinitis 08/13/2020   Not currently working due to disabled status 08/13/2020   Pain in joint of left knee 07/25/2020   History of thyroid cancer 06/09/2020   Arthralgia of left elbow 05/01/2020   Autonomic dysfunction 04/23/2020   Iron deficiency anemia due to chronic blood loss 01/18/2020   Microalbuminuria 01/18/2020   Chronic insomnia 01/17/2020   Large liver 01/17/2020   Prediabetes 01/11/2019   Daytime somnolence 07/22/2017   History of basal cell carcinoma (BCC) 11/30/2016   GERD (gastroesophageal reflux  disease) 08/25/2016   Essential hypertension 08/25/2016   Substance induced mood disorder (HCC)    History of stroke    Past Surgical History:  Procedure Laterality Date   COLONOSCOPY  07/04/2019   done for hematochezia and FH col ca-mother: Mod int hem w/inflamm.  No adenomatous polyps.   INCISIONAL HERNIA REPAIR  06/2019   RUQ (prev lap append site)   LAPAROSCOPIC  APPENDECTOMY N/A 03/25/2017   Procedure: APPENDECTOMY LAPAROSCOPIC; EXTENSIVE LYSIS OF ADHESIONS FROM PRIOR RADIATION AND SURGERY;  Surgeon: Luretha Murphy, MD;  Location: [facility];  Service: General;  Laterality: N/A;   PARATHYROIDECTOMY     SPLENECTOMY     TEE WITHOUT CARDIOVERSION N/A 07/18/2015   Procedure: TRANSESOPHAGEAL ECHOCARDIOGRAM (TEE);  Surgeon: [name], MD;  Location: [facility] ENDOSCOPY;  Service: Cardiovascular;  Laterality: N/A;   THYROIDECTOMY     Family History  Problem Relation Age of Onset   Breast cancer Mother        dx early 76s   Colon cancer Mother        dx early 59s   Other Mother        NTHL1 homozygous   Multiple myeloma Father        dx 59s   Breast cancer Maternal Aunt    Stroke Maternal Grandmother    Leukemia Other        dx <20; two paternal first cousins once removed   Outpatient Medications Prior to Visit  Medication Sig Dispense Refill   albuterol (PROVENTIL) (5 MG/ML) 0.5% nebulizer solution      albuterol (VENTOLIN HFA) 108 (90 Base) MCG/ACT inhaler INHALE 2 PUFFS BY MOUTH EVERY 6 HOURS AS NEEDED FOR SHORTNESS OF BREATH 9 g 0   albuterol (VENTOLIN HFA) 108 (90 Base) MCG/ACT inhaler Inhale 2 puffs into the lungs every 6 (six) hours as needed. For shortness of breath. 18 g 1   ammonium lactate (LAC-HYDRIN) 12 % lotion Apply 1 application topically as needed for dry skin.     aspirin 325 MG tablet Take 1 tablet (325 mg total) by mouth daily. 30 tablet 0   azelastine (ASTELIN) 0.1 % nasal spray Place 1 spray into both nostrils daily. Use in each nostril as directed 30 mL 5   baclofen (LIORESAL) 10 MG tablet Take 1 tablet by mouth three times daily as needed 30 tablet 5   Daridorexant HCl (QUVIVIQ) 25 MG TABS Take 25 mg by mouth at bedtime. 30 tablet 3   EPINEPHrine 0.3 mg/0.3 mL IJ SOAJ injection Inject into the muscle.     esomeprazole (NEXIUM) 40 MG capsule TAKE 1 CAPSULE BY MOUTH TWICE DAILY BEFORE A MEAL 180 capsule 3   EUTHYROX 200 MCG tablet  Take 200 mcg by mouth daily.     ferrous sulfate (FERROUSUL) 325 (65 FE) MG tablet Take 1 tablet (325 mg total) by mouth 2 (two) times daily with a meal. 180 tablet 1   fluorouracil (EFUDEX) 5 % cream Apply topically twice daily for 4 weeks.     fluticasone (FLONASE) 50 MCG/ACT nasal spray Place 1 spray into both nostrils daily. 9.9 mL 6   gabapentin (NEURONTIN) 300 MG capsule TAKE 4 CAPSULES BY MOUTH IN THE MORNING AND 2 CAPSULES AT NOON AND 2 CAPSULES AT BEDTIME 240 capsule 1   HYDROcodone-acetaminophen (NORCO/VICODIN) 5-325 MG tablet Norco 5 mg-325 mg tablet  Take 1 tablet every 6 hours by oral route.     hydrOXYzine (VISTARIL) 50 MG capsule Take 1 capsule (50 mg  total) by mouth at bedtime. 90 capsule 3   lisinopril (ZESTRIL) 40 MG tablet Take 0.5 tablets (20 mg total) by mouth daily. 90 tablet 3   LUER LOCK SAFETY SYRINGES 22G X 1-1/2" 3 ML MISC USE FOR SELF ADMINISTER OF TESTOSTERONE 2CC ONCE EVERY 2 WEEKS     Multiple Vitamin (MULTI-VITAMIN) tablet Take 1 tablet by mouth daily.     rosuvastatin (CRESTOR) 40 MG tablet Take 1 tablet by mouth once daily 90 tablet 3   testosterone cypionate (DEPOTESTOTERONE CYPIONATE) 100 MG/ML injection Inject 100 mg into the muscle every 14 (fourteen) days. For IM use only     traZODone (DESYREL) 100 MG tablet Take 1 tablet (100 mg total) by mouth at bedtime. Take together with trazodone 300 mg at bedtime (total of 400 mg daily) 90 tablet 3   TRELEGY ELLIPTA 200-62.5-25 MCG/ACT AEPB Inhale 1 puff into the lungs daily. 60 each 5   verapamil (VERELAN PM) 180 MG 24 hr capsule Take 1 capsule (180 mg total) by mouth at bedtime. 90 capsule 3   Facility-Administered Medications Prior to Visit  Medication Dose Route Frequency Provider Last Rate Last Admin   Mepolizumab SOLR 100 mg  100 mg Subcutaneous Q28 days Alfonse Spruce, MD   100 mg at 07/08/21 1108   omalizumab Geoffry Paradise) injection 300 mg  300 mg Subcutaneous Q14 Days Ellamae Sia, DO   300 mg at 08/05/21  1655   Allergies  Allergen Reactions   Ciprofloxacin Other (See Comments)    Spontaneous rupture of tendon   Earnestine Leys Officinalis] Anaphylaxis and Hives   Fluocinolone Other (See Comments)    "spontaneous rupture of tendons" per pt dr.      Dione Housekeeper:   Today's Vitals   03/03/22 1104  BP: 140/78  Pulse: (!) 105  Temp: (!) 97 F (36.1 C)  TempSrc: Temporal  SpO2: 96%  Weight: 215 lb 3.2 oz (97.6 kg)   Body mass index is 29.19 kg/m.   General: Well developed, well nourished. No acute distress. Skin: Warm and dry. There is a firm, but mobile 2-3 mm round subcutaneous mass over the right flank area.   This is not tender and there is no erythema. Psych: Alert and oriented. Normal mood and affect.  There are no preventive care reminders to display for this patient.    Assessment & Plan:   1. Essential hypertension Mr. Boland blood pressure is improved today. I will continue him on the 40 mg dose of lisinopril and verapamil 180 mg daily. I am concerned about the impact of his testosterone on his BP, esp. in light of his stroke history. I strongly urged him to discuss this with his endocrinologist.  - lisinopril (ZESTRIL) 40 MG tablet; Take 1 tablet (40 mg total) by mouth daily.  Dispense: 90 tablet; Refill: 3  2. Chronic insomnia Mr. Sciullo is tapering off of his trazodone with the goal to eventually stop this. I remain concerned about his use of any of these agents, as they have not been effective for him.   3. Squamous cell carcinoma in situ Currently on 5-FU cream.  4. Benign cyst of skin- right flank I reassured him about the cyst. We will observe this for now.  Return in about 3 months (around 06/03/2022) for Reassessment.   Loyola Mast, MD

## 2022-03-07 ENCOUNTER — Other Ambulatory Visit: Payer: Self-pay | Admitting: Family Medicine

## 2022-03-07 DIAGNOSIS — J310 Chronic rhinitis: Secondary | ICD-10-CM

## 2022-03-07 DIAGNOSIS — G8929 Other chronic pain: Secondary | ICD-10-CM

## 2022-03-13 ENCOUNTER — Other Ambulatory Visit: Payer: Self-pay | Admitting: Family Medicine

## 2022-03-13 DIAGNOSIS — I10 Essential (primary) hypertension: Secondary | ICD-10-CM

## 2022-03-14 ENCOUNTER — Encounter: Payer: Self-pay | Admitting: Family Medicine

## 2022-03-15 ENCOUNTER — Other Ambulatory Visit: Payer: Self-pay

## 2022-03-15 NOTE — Telephone Encounter (Signed)
Chart supports rx. Last OV: 03/03/2022 Next OV: 06/02/2022

## 2022-03-23 ENCOUNTER — Telehealth: Payer: Self-pay | Admitting: Family Medicine

## 2022-03-23 DIAGNOSIS — I10 Essential (primary) hypertension: Secondary | ICD-10-CM

## 2022-03-23 MED ORDER — LISINOPRIL 40 MG PO TABS: 40.0000 mg | ORAL_TABLET | Freq: Every day | ORAL | 3 refills | Status: DC

## 2022-03-23 NOTE — Telephone Encounter (Signed)
Sig: Take 1 tablet (40 mg total) by mouth daily.       Start Date: 03/03/22 End Date: --  Written Date: 03/03/22 Expiration Date: 03/03/23     Associated Diagnoses: Essential hypertension [I10]  Original Order:  lisinopril (ZESTRIL) 40 MG tablet [[id number]]   Pt stated that [facility] told him the script was written for 1/2 tablet daily. They will not refill until they receive the correct prescription. I assured him that Dr Gena Fray wrote it for 1 tablet daily 90 tablets with 3 refills. Due to this he is completely out and very worried about strokes.

## 2022-03-23 NOTE — Telephone Encounter (Signed)
Called patient, LT VM to advised that we sent the prescription to the pharmacy and he should be good.  If any problem to call us back. Dm/cma

## 2022-03-29 DIAGNOSIS — M25511 Pain in right shoulder: Secondary | ICD-10-CM | POA: Diagnosis not present

## 2022-03-29 DIAGNOSIS — Z85850 Personal history of malignant neoplasm of thyroid: Secondary | ICD-10-CM | POA: Diagnosis not present

## 2022-03-29 DIAGNOSIS — E890 Postprocedural hypothyroidism: Secondary | ICD-10-CM | POA: Diagnosis not present

## 2022-03-29 DIAGNOSIS — E236 Other disorders of pituitary gland: Secondary | ICD-10-CM | POA: Diagnosis not present

## 2022-03-29 DIAGNOSIS — R7303 Prediabetes: Secondary | ICD-10-CM | POA: Diagnosis not present

## 2022-04-09 DIAGNOSIS — E892 Postprocedural hypoparathyroidism: Secondary | ICD-10-CM | POA: Diagnosis not present

## 2022-04-09 DIAGNOSIS — E890 Postprocedural hypothyroidism: Secondary | ICD-10-CM | POA: Diagnosis not present

## 2022-04-09 DIAGNOSIS — C73 Malignant neoplasm of thyroid gland: Secondary | ICD-10-CM | POA: Diagnosis not present

## 2022-04-09 DIAGNOSIS — R7303 Prediabetes: Secondary | ICD-10-CM | POA: Diagnosis not present

## 2022-04-20 ENCOUNTER — Ambulatory Visit (INDEPENDENT_AMBULATORY_CARE_PROVIDER_SITE_OTHER): Payer: Medicare Other | Admitting: Allergy & Immunology

## 2022-04-20 VITALS — BP 138/88 | HR 88 | Temp 98.8°F | Ht 72.0 in | Wt 202.0 lb

## 2022-04-20 DIAGNOSIS — J302 Other seasonal allergic rhinitis: Secondary | ICD-10-CM

## 2022-04-20 DIAGNOSIS — J3089 Other allergic rhinitis: Secondary | ICD-10-CM

## 2022-04-20 DIAGNOSIS — J4550 Severe persistent asthma, uncomplicated: Secondary | ICD-10-CM

## 2022-04-20 DIAGNOSIS — R058 Other specified cough: Secondary | ICD-10-CM

## 2022-04-20 DIAGNOSIS — Y9389 Activity, other specified: Secondary | ICD-10-CM

## 2022-04-20 MED ORDER — AMOXICILLIN-POT CLAVULANATE 875-125 MG PO TABS: 1.0000 | ORAL_TABLET | Freq: Two times a day (BID) | ORAL | 0 refills | Status: AC

## 2022-04-20 NOTE — Progress Notes (Unsigned)
FOLLOW UP  Date of Service/Encounter:  04/20/22   Assessment:   Moderate persistent asthma - much better on Trelegy  Productive cough x 2 weeks - starting antibiotic out of an abundance of caution  History of pulmonary nodules   Elevated AEC in the past (600 in March 2020)   Perennial and seasonal allergic rhinitis (dust mites, cat, dog, grasses, molds, trees, and ragweed)   Complicated past medical history, including Hodgkin's lymphoma at age 46 and subsequent malignancies since that time  Plan/Recommendations:   1. Moderate persistent asthma, uncomplicated - Lung testing looks even better today.  - We are not going to make any changes at this time. - I am going to get a chest X-ray to check on possible pneumonia with your history of sputum production. - We might have to follow up with a chest CT to check on these nodules.  - Daily controller medication(s): Trelegy 200/62.5/25 one puff once daily  - Prior to physical activity: albuterol 2 puffs 10-15 minutes before physical activity. - Rescue medications: albuterol 4 puffs every 4-6 hours as needed - Asthma control goals:  * Full participation in all desired activities (may need albuterol before activity) * Albuterol use two time or less a week on average (not counting use with activity) * Cough interfering with sleep two time or less a month * Oral steroids no more than once a year * No hospitalizations  2. Chronic rhinitis (dust mites, cat, dog, grasses, molds, trees, and ragweed) - Continue with the Zyrtec (cetirizine) '10mg'$  daily.  - Continue with Flonase one spray per nostril daily. - Continue with Astelin one spray per nostril daily.  3. 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  - No further episodes. - We will continue to monitor.   4. Return in about 6 months (around 10/21/2022).    Subjective:   Troy [name] is a 46 y.o. male presenting today for follow up of  Chief Complaint   Patient presents with   Follow-up   Cough    With "black" mucus    Troy [name] has a history of the following: Patient Active Problem List   Diagnosis Date Noted   Rotator cuff tear, right 03/03/2022   History of squamous cell carcinoma in situ (SCCIS) of skin 02/22/2022   Osteoarthritis of acromioclavicular joint 10/15/2021   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   Carrier of [place] gene mutation  07/16/2021   History of splenectomy 02/02/2021   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   Erectile dysfunction 02/02/2021   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   Pulmonary fibrosis ([place]) 02/02/2021   Peripheral neuropathy 02/02/2021   History of hepatitis C 02/02/2021   Back pain 02/02/2021   Hyperlipidemia 02/02/2021   Moderate persistent asthma, uncomplicated 87/56/4332   History of Hodgkin's lymphoma 08/13/2020   Chronic rhinitis 08/13/2020   Not currently working due to disabled status 08/13/2020   Pain in joint of left knee 07/25/2020   History of thyroid cancer 06/09/2020   Arthralgia of left elbow 05/01/2020   Autonomic dysfunction 04/23/2020   Iron deficiency anemia due to chronic blood loss 01/18/2020   Microalbuminuria 01/18/2020   Chronic insomnia 01/17/2020   Large liver 01/17/2020   Prediabetes 01/11/2019   Daytime somnolence 07/22/2017   History of basal cell carcinoma (BCC) 11/30/2016   GERD (gastroesophageal reflux disease) 08/25/2016   Essential hypertension 08/25/2016   Substance induced mood disorder ([place])    History of stroke     History obtained from:  chart review and patient.  Troy [name] is a 46 y.o. male presenting for a follow up visit.  He was last seen in January 2023.  At that time, his lung testing looked decent.  We continue with Trelegy 200 mcg 1 puff once daily.  For his rhinitis, we continue with Zyrtec as well as Flonase and Astelin.  We gave him an EpiPen because he was endorsing symptoms consistent with exercise-induced anaphylaxis.  Since  the last visit, he has done well.   Asthma/Respiratory Symptom History: He has been coughing up dark brown mucous and green mucous. He denies any fever. He does smoke pot which is ongoing for years. He is not getting his pot from a new course and he does not think that it is laced with anything in particular. He thinks he has some kind of "funk". He does report some nausea when he coughs and feels that his "tonsils" are in the way and "gagging" him. He has to do the "power cough" to get stuff up from his lungs. He does that many times and then it makes him sick.   Of note, he has a history of lung noules that were diagnosed arund the time that he had thyroid cancer the second time. His last chest CT that I can find in September 2017:   FINDINGS:  Thoracic inlet/central airways: Status post thyroidectomy. Airway patent.      Mediastinum/hila/axilla: No adenopathy. Patulous fluid-filled esophagus may place the patient risk for aspiration.  Heart/vessels: Normal heart size. No pericardial effusion. Aorta normal in caliber and appearance. No central pulmonary embolism.      Lungs/pleura: No pulmonary parenchymal disease, pleural effusion, or pneumothorax. Biapical pleural parenchymal scarring, likely from prior radiation. Right upper lobe calcified granuloma. 4 mm partially calcified left lower lobe nodule (series 3, image 192) for which no specific follow-up is indicated.  Upper abdomen: Subcentimeter interpolar right renal hypodensity, too small to characterize but statistically a cyst. Status post splenectomy.  Chest wall/MSK: No evidence of acute osseous abnormality or aggressive osseous lesions.   Allergic Rhinitis Symptom History: For his allergic rhinitis, he remains on Zyrtec as well as Flonase and Astelin. He has not needed antibiotics at all since last time we saw him.  He has had no episodes of exercise-induced anaphylaxis since last time we saw him.  Overall, his health has remained fairly  stable all things considered.  He does have a history of strokes that occurred in October 2016.  He did not get disability for these until 2019.  His dad, who has been on the edge of death due to Parkinson's as well as a chronic cancer, actually has had more energy over the past few months. He reports that his mother likely has some early onset dementia.  He has a rather strained relationship with his brother and sister, although it seems to be getting closer.  Otherwise, there have been no changes to his past medical history, surgical history, family history, or social history.    Review of Systems  Constitutional: Negative.  Negative for fever, malaise/fatigue and weight loss.  HENT: Negative.  Negative for congestion, ear discharge, ear pain and sinus pain.   Eyes:  Negative for pain, discharge and redness.  Respiratory:  Negative for cough, sputum production, shortness of breath and wheezing.   Cardiovascular: Negative.  Negative for chest pain and palpitations.  Gastrointestinal:  Negative for abdominal pain, constipation, diarrhea, heartburn, nausea and vomiting.  Skin: Negative.  Negative for itching and rash.  Neurological:  Negative for dizziness and headaches.  Endo/Heme/Allergies:  Negative for environmental allergies. Does not bruise/bleed easily.  All other systems reviewed and are negative.      Objective:   Blood pressure 138/88, pulse 88, temperature 98.8 F (37.1 C), temperature source Oral, height 6' (1.829 m), weight 202 lb (91.6 kg), SpO2 95 %. Body mass index is 27.4 kg/m.    Physical Exam Vitals reviewed.  Constitutional:      Appearance: He is well-developed.     Comments: Pleasant very talkative male.  HENT:     Head: Normocephalic and atraumatic.     Right Ear: Tympanic membrane, ear canal and external ear normal.     Left Ear: Tympanic membrane, ear canal and external ear normal.     Nose: No nasal deformity, septal deviation, mucosal edema or  rhinorrhea.     Right Turbinates: Enlarged, swollen and pale.     Left Turbinates: Enlarged, swollen and pale.     Right Sinus: No maxillary sinus tenderness or frontal sinus tenderness.     Left Sinus: No maxillary sinus tenderness or frontal sinus tenderness.     Mouth/Throat:     Mouth: Mucous membranes are not pale and not dry.     Pharynx: Uvula midline.  Eyes:     General: Lids are normal. No allergic shiner.       Right eye: No discharge.        Left eye: No discharge.     Conjunctiva/sclera: Conjunctivae normal.     Right eye: Right conjunctiva is not injected. No chemosis.    Left eye: Left conjunctiva is not injected. No chemosis.    Pupils: Pupils are equal, round, and reactive to light.  Cardiovascular:     Rate and Rhythm: Normal rate and regular rhythm.     Heart sounds: Normal heart sounds.  Pulmonary:     Effort: Pulmonary effort is normal. No tachypnea, accessory muscle usage or respiratory distress.     Breath sounds: Rhonchi present. No wheezing or rales.     Comments: Coarse upper airway sounds throughout.   Chest:     Chest wall: No tenderness.  Lymphadenopathy:     Cervical: No cervical adenopathy.  Skin:    Coloration: Skin is not pale.     Findings: No abrasion, erythema, petechiae or rash. Rash is not papular, urticarial or vesicular.  Neurological:     Mental Status: He is alert.  Psychiatric:        Behavior: Behavior is cooperative.      Diagnostic studies:     Spirometry: results abnormal (FEV1: 3.37/77%, FVC: 4.30/78%, FEV1/FVC: 78%).    Spirometry consistent with possible restrictive disease.   Allergy Studies: none        Salvatore Marvel, MD  [facility]

## 2022-04-20 NOTE — Patient Instructions (Addendum)
1. Moderate persistent asthma, uncomplicated - Lung testing looks even better today.  - We are not going to make any changes at this time. - I am going to get a chest X-ray to check on possible pneumonia with your history of sputum production. - We might have to follow up with a chest CT to check on these nodules.  - Daily controller medication(s): Trelegy 200/62.5/25 one puff once daily  - Prior to physical activity: albuterol 2 puffs 10-15 minutes before physical activity. - Rescue medications: albuterol 4 puffs every 4-6 hours as needed - Asthma control goals:  * Full participation in all desired activities (may need albuterol before activity) * Albuterol use two time or less a week on average (not counting use with activity) * Cough interfering with sleep two time or less a month * Oral steroids no more than once a year * No hospitalizations  2. Chronic rhinitis (dust mites, cat, dog, grasses, molds, trees, and ragweed) - Continue with the Zyrtec (cetirizine) '10mg'$  daily.  - Continue with Flonase one spray per nostril daily. - Continue with Astelin one spray per nostril daily.  3. 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  - No further episodes. - We will continue to monitor.   4. Return in about 6 months (around 10/21/2022).    Please inform us of any Emergency Department visits, hospitalizations, or changes in symptoms. Call us before going to the ED for breathing or allergy symptoms since we might be able to fit you in for a sick visit. Feel free to contact us anytime with any questions, problems, or concerns.  It was a pleasure to see you again today!   Websites that have reliable patient information: 1. American Academy of Asthma, Allergy, and Immunology: www.aaaai.org 2. Food Allergy Research and Education (FARE): foodallergy.org 3. Mothers of Asthmatics: http://www.asthmacommunitynetwork.org 4. American College of Allergy, Asthma, and Immunology:  www.acaai.org   COVID-19 Vaccine Information can be found at: ShippingScam.co.uk For questions related to vaccine distribution or appointments, please email [email or web address] or call [phone].   We realize that you might be concerned about having an allergic reaction to the COVID19 vaccines. To help with that concern, WE ARE OFFERING THE COVID19 VACCINES IN OUR OFFICE! Ask the front desk for dates!     "Like" [place] on Facebook and Instagram for our latest updates!      A healthy democracy works best when [employer] participate! Make sure you are registered to vote! If you have moved or changed any of your contact information, you will need to get this updated before voting!  In some cases, you MAY be able to register to vote online: [email or web address]

## 2022-04-21 ENCOUNTER — Ambulatory Visit
Admission: RE | Admit: 2022-04-21 | Discharge: 2022-04-21 | Disposition: A | Discharge status: Home / Self Care | Payer: Medicare Other | Source: Ambulatory Visit | Attending: Allergy & Immunology | Admitting: Allergy & Immunology

## 2022-04-21 ENCOUNTER — Encounter: Payer: Self-pay | Admitting: Allergy & Immunology

## 2022-04-21 DIAGNOSIS — R059 Cough, unspecified: Secondary | ICD-10-CM | POA: Diagnosis not present

## 2022-04-21 DIAGNOSIS — R079 Chest pain, unspecified: Secondary | ICD-10-CM | POA: Diagnosis not present

## 2022-04-21 DIAGNOSIS — R0602 Shortness of breath: Secondary | ICD-10-CM | POA: Diagnosis not present

## 2022-04-21 DIAGNOSIS — R058 Other specified cough: Secondary | ICD-10-CM

## 2022-04-22 DIAGNOSIS — L738 Other specified follicular disorders: Secondary | ICD-10-CM | POA: Diagnosis not present

## 2022-05-02 ENCOUNTER — Other Ambulatory Visit: Payer: Self-pay | Admitting: Allergy & Immunology

## 2022-05-04 DIAGNOSIS — M25511 Pain in right shoulder: Secondary | ICD-10-CM | POA: Diagnosis not present

## 2022-05-09 ENCOUNTER — Other Ambulatory Visit: Payer: Self-pay | Admitting: Family Medicine

## 2022-05-09 DIAGNOSIS — M545 Low back pain, unspecified: Secondary | ICD-10-CM

## 2022-05-09 DIAGNOSIS — F5104 Psychophysiologic insomnia: Secondary | ICD-10-CM

## 2022-05-25 DIAGNOSIS — M25511 Pain in right shoulder: Secondary | ICD-10-CM | POA: Diagnosis not present

## 2022-06-02 ENCOUNTER — Encounter: Payer: Self-pay | Admitting: Family Medicine

## 2022-06-02 ENCOUNTER — Ambulatory Visit (INDEPENDENT_AMBULATORY_CARE_PROVIDER_SITE_OTHER): Payer: Medicare Other | Admitting: Family Medicine

## 2022-06-02 VITALS — BP 134/70 | HR 94 | Temp 98.1°F | Ht 72.0 in | Wt 207.4 lb

## 2022-06-02 DIAGNOSIS — I10 Essential (primary) hypertension: Secondary | ICD-10-CM | POA: Diagnosis not present

## 2022-06-02 DIAGNOSIS — E890 Postprocedural hypothyroidism: Secondary | ICD-10-CM

## 2022-06-02 DIAGNOSIS — F5104 Psychophysiologic insomnia: Secondary | ICD-10-CM

## 2022-06-02 DIAGNOSIS — R399 Unspecified symptoms and signs involving the genitourinary system: Secondary | ICD-10-CM

## 2022-06-02 DIAGNOSIS — E291 Testicular hypofunction: Secondary | ICD-10-CM

## 2022-06-02 MED ORDER — TRAZODONE HCL 150 MG PO TABS: ORAL_TABLET | ORAL | 3 refills | Status: DC

## 2022-06-02 MED ORDER — LEVOTHYROXINE SODIUM 200 MCG PO TABS: ORAL_TABLET | ORAL | 6 refills | Status: DC

## 2022-06-02 MED ORDER — TAMSULOSIN HCL 0.4 MG PO CAPS: 0.4000 mg | ORAL_CAPSULE | Freq: Every day | ORAL | 3 refills | Status: DC

## 2022-06-02 MED ORDER — TESTOSTERONE CYPIONATE 200 MG/ML IM SOLN: 120.0000 mg | INTRAMUSCULAR | 0 refills | Status: AC

## 2022-06-02 NOTE — Progress Notes (Signed)
[facility] [address] [place] [address] Dept: [phone] Dept Fax: [phone]  Chronic Care Office Visit  Subjective:    Patient ID: Troy [name], male    DOB: Feb 27, 1976, 46 y.o..   MRN: [id number]  Chief Complaint  Patient presents with   Follow-up    3 month f/u.      History of Present Illness:  Patient is in today for reassessment of chronic medical issues.  Mr. Twiggs has a history of essential hypertension. His blood pressures had been up some last spring. He continues to note an occasional increase in this pressure that settles down later in the day. He is managed on verapamil 180 mg daily and lisinopril 40 mg daily.    Mr. Bombara has a long-standing history of insomnia. We had some difficulty managing this and had tried multiple other medications, including hydroxyzine, trazodone 300 mg, melatonin, GABA, tryptophan, zaleplon ([place]) and suvorexant ([place]ra). Currently he is using trazodone 75 mg nightly, except 150 mg on Friday and Saturday. He continues to use daridorexant Lennox Solders), though he questions how effective this is.    Mr. Umberger has a history of hypothyroidism. He is managed by endocrinology. He is on Euthyrox 200 mcg daily except 400 mcg on Sun and Wed. He notes he had some recently testing that was abnormal. However, it was determined that his supplementation with biotin was likely causing this. he has plans for repeat testing.  Mr. Whybrew has a history of hypogonadism. He remains on testosterone replacement, 120 mg weekly. He had recent follow up with endocrinology related to this.  Mr. Niedringhaus notes a long-standing issue with having a "shy" bladder. He notes he has often found it difficult to urinate in public bathrooms. However, he also notes that he has starting/stopping of his stream, frequency, and does urinate several times a night.  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   History of splenectomy 02/02/2021   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   Erectile dysfunction 02/02/2021   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   Pulmonary fibrosis (HCC) 02/02/2021   Peripheral neuropathy 02/02/2021   History of hepatitis C 02/02/2021   Back pain 02/02/2021   Hyperlipidemia 02/02/2021   Moderate persistent asthma, uncomplicated 08/13/2020   History of Hodgkin's lymphoma 08/13/2020   Chronic rhinitis 08/13/2020   Not currently working due to disabled status 08/13/2020   Pain in joint of left knee 07/25/2020   History of thyroid cancer 06/09/2020   Arthralgia of left elbow 05/01/2020   Autonomic dysfunction 04/23/2020   Iron deficiency anemia due to chronic blood loss 01/18/2020   Microalbuminuria 01/18/2020   Chronic insomnia 01/17/2020   Large liver 01/17/2020   Prediabetes 01/11/2019   Daytime somnolence 07/22/2017   History of basal cell carcinoma (BCC) 11/30/2016   GERD (gastroesophageal reflux disease) 08/25/2016   Essential hypertension 08/25/2016   Substance induced mood disorder ([facility])    History of stroke    Past Surgical History:  Procedure Laterality Date   COLONOSCOPY  07/04/2019   done for hematochezia and FH col ca-mother: Mod int hem w/inflamm.  No adenomatous polyps.   INCISIONAL HERNIA REPAIR  06/2019  RUQ (prev lap append site)   LAPAROSCOPIC APPENDECTOMY N/A 03/25/2017   Procedure: APPENDECTOMY LAPAROSCOPIC; EXTENSIVE LYSIS OF ADHESIONS FROM PRIOR RADIATION AND SURGERY;  Surgeon: Luretha Murphy, MD;  Location: [facility] ORS;  Service: General;  Laterality: N/A;   PARATHYROIDECTOMY     SPLENECTOMY     TEE WITHOUT CARDIOVERSION  N/A 07/18/2015   Procedure: TRANSESOPHAGEAL ECHOCARDIOGRAM (TEE);  Surgeon: [name], MD;  Location: [facility] ENDOSCOPY;  Service: Cardiovascular;  Laterality: N/A;   THYROIDECTOMY     Family History  Problem Relation Age of Onset   Breast cancer Mother        dx early 70s   Colon cancer Mother        dx early 62s   Other Mother        NTHL1 homozygous   Multiple myeloma Father        dx 64s   Breast cancer Maternal Aunt    Stroke Maternal Grandmother    Leukemia Other        dx <20; two paternal first cousins once removed   Outpatient Medications Prior to Visit  Medication Sig Dispense Refill   albuterol (PROVENTIL) (5 MG/ML) 0.5% nebulizer solution      albuterol (VENTOLIN HFA) 108 (90 Base) MCG/ACT inhaler INHALE 2 PUFFS BY MOUTH EVERY 6 HOURS AS NEEDED FOR SHORTNESS OF BREATH 18 g 0   ammonium lactate (LAC-HYDRIN) 12 % lotion Apply 1 application topically as needed for dry skin.     aspirin 325 MG tablet Take 1 tablet (325 mg total) by mouth daily. 30 tablet 0   baclofen (LIORESAL) 10 MG tablet Take 1 tablet by mouth three times daily as needed 30 tablet 5   clindamycin (CLEOCIN T) 1 % lotion Apply topically 2 (two) times daily.     EPINEPHrine 0.3 mg/0.3 mL IJ SOAJ injection Inject into the muscle.     esomeprazole (NEXIUM) 40 MG capsule TAKE 1 CAPSULE BY MOUTH TWICE DAILY BEFORE A MEAL 180 capsule 3   fluorouracil (EFUDEX) 5 % cream Apply topically twice daily for 4 weeks.     fluticasone (FLONASE) 50 MCG/ACT nasal spray Use 1 spray(s) in each nostril once daily 16 g 11   gabapentin (NEURONTIN) 300 MG capsule TAKE 4 CAPSULES BY MOUTH IN THE MORNING AND 2 CAPSULES AT NOON AND 2 CAPSULES AT BEDTIME 240 capsule 1   hydrOXYzine (VISTARIL) 50 MG capsule Take 1 capsule (50 mg total) by mouth at bedtime. 90 capsule 3   lisinopril (ZESTRIL) 40 MG tablet Take 1 tablet (40 mg total) by mouth daily. 90 tablet 3   LUER LOCK SAFETY SYRINGES 22G X 1-1/2" 3 ML MISC USE FOR SELF ADMINISTER OF  TESTOSTERONE 2CC ONCE EVERY 2 WEEKS     Multiple Vitamin (MULTI-VITAMIN) tablet Take 1 tablet by mouth daily.     QUVIVIQ 25 MG TABS TAKE ONE TABLET BY MOUTH AT BEDTIME. 30 tablet 2   rosuvastatin (CRESTOR) 40 MG tablet Take 1 tablet by mouth once daily 90 tablet 3   traZODone (DESYREL) 150 MG tablet Take 150 mg by mouth at bedtime. 75 mg Sunday - Thursday. Then Friday, Saturday 150 mg     TRELEGY ELLIPTA 200-62.5-25 MCG/ACT AEPB Inhale 1 puff into the lungs daily. 60 each 5   verapamil (VERELAN PM) 180 MG 24 hr capsule Take 1 capsule by mouth at bedtime 90 capsule 0   EUTHYROX 200 MCG tablet Take 200 mcg by mouth daily.     testosterone cypionate (DEPOTESTOTERONE  CYPIONATE) 100 MG/ML injection Inject 100 mg into the muscle every 14 (fourteen) days. For IM use only     azelastine (ASTELIN) 0.1 % nasal spray Place 1 spray into both nostrils daily. Use in each nostril as directed 30 mL 5   ferrous sulfate (FERROUSUL) 325 (65 FE) MG tablet Take 1 tablet (325 mg total) by mouth 2 (two) times daily with a meal. (Patient not taking: Reported on 04/20/2022) 180 tablet 1   HYDROcodone-acetaminophen (NORCO/VICODIN) 5-325 MG tablet Norco 5 mg-325 mg tablet  Take 1 tablet every 6 hours by oral route. (Patient not taking: Reported on 04/20/2022)     No facility-administered medications prior to visit.   Allergies  Allergen Reactions   Ciprofloxacin Other (See Comments)    Spontaneous rupture of tendon   Earnestine Leys Officinalis] Anaphylaxis and Hives   Fluocinolone Other (See Comments)    "spontaneous rupture of tendons" per pt dr.     Dione Housekeeper:   Today's Vitals   06/02/22 1305  BP: 134/70  Pulse: 94  Temp: 98.1 F (36.7 C)  TempSrc: Temporal  SpO2: 96%  Weight: 207 lb 6.4 oz (94.1 kg)  Height: 6' (1.829 m)   Body mass index is 28.13 kg/m.   General: Well developed, well nourished. No acute distress. Psych: Alert and oriented. Normal mood and affect.  Health Maintenance Due  Topic Date  Due   INFLUENZA VACCINE  05/11/2022     Assessment & Plan:   1. Essential hypertension Blood pressure is improved. I will continue his verapamil and lisinopril.  2. Postoperative hypothyroidism Pending retesting of TSH when off of biotin.  - levothyroxine (EUTHYROX) 200 MCG tablet; Take 1 tablet (200 mcg total) by mouth daily before breakfast, except take 2 tablets (400 mcg total) on Sunday and Wednesday.  Dispense: 40 tablet; Refill: 6  3. Male hypogonadism Managed by endocrinology.   - testosterone cypionate (DEPOTESTOSTERONE CYPIONATE) 200 MG/ML injection; Inject 0.6 mLs (120 mg total) into the muscle once a week.  Dispense: 10 mL; Refill: 0  4. Chronic insomnia I feel better that he has scaled down on the agents he is using for insomnia and his dosage.   - traZODone (DESYREL) 150 MG tablet; Take 1/2 tablet (75 mg total) by mouth at bedtime, except take 1 tablet (150 mg total) on Friday and Saturday.  Dispense: 90 tablet; Refill: 3  5. Lower urinary tract symptoms (LUTS) In light of the current symptoms, I think it reasonable to give a trial of an alpha blocker and see if this will improve. This may also help some with his blood pressure control.  - tamsulosin (FLOMAX) 0.4 MG CAPS capsule; Take 1 capsule (0.4 mg total) by mouth daily.  Dispense: 30 capsule; Refill: 3   Return in about 3 months (around 09/02/2022) for Reassessment.   Loyola Mast, MD

## 2022-06-05 ENCOUNTER — Other Ambulatory Visit: Payer: Self-pay | Admitting: Family Medicine

## 2022-06-05 DIAGNOSIS — I10 Essential (primary) hypertension: Secondary | ICD-10-CM

## 2022-06-14 ENCOUNTER — Other Ambulatory Visit: Payer: Self-pay | Admitting: Allergy & Immunology

## 2022-06-16 DIAGNOSIS — M25511 Pain in right shoulder: Secondary | ICD-10-CM | POA: Diagnosis not present

## 2022-06-23 ENCOUNTER — Other Ambulatory Visit: Payer: Self-pay | Admitting: Allergy & Immunology

## 2022-06-24 ENCOUNTER — Encounter: Payer: Self-pay | Admitting: Family Medicine

## 2022-06-24 MED ORDER — TRELEGY ELLIPTA 200-62.5-25 MCG/ACT IN AEPB: 1.0000 | INHALATION_SPRAY | Freq: Every day | RESPIRATORY_TRACT | 5 refills | Status: DC

## 2022-07-03 ENCOUNTER — Other Ambulatory Visit: Payer: Self-pay | Admitting: Family Medicine

## 2022-07-03 DIAGNOSIS — G63 Polyneuropathy in diseases classified elsewhere: Secondary | ICD-10-CM

## 2022-07-06 ENCOUNTER — Encounter: Payer: Self-pay | Admitting: Family Medicine

## 2022-07-07 DIAGNOSIS — L905 Scar conditions and fibrosis of skin: Secondary | ICD-10-CM | POA: Diagnosis not present

## 2022-07-07 DIAGNOSIS — L739 Follicular disorder, unspecified: Secondary | ICD-10-CM | POA: Diagnosis not present

## 2022-07-09 ENCOUNTER — Other Ambulatory Visit: Payer: Self-pay | Admitting: Allergy & Immunology

## 2022-07-12 ENCOUNTER — Encounter: Payer: Self-pay | Admitting: Family Medicine

## 2022-07-12 ENCOUNTER — Ambulatory Visit (INDEPENDENT_AMBULATORY_CARE_PROVIDER_SITE_OTHER): Payer: Medicare Other

## 2022-07-12 ENCOUNTER — Ambulatory Visit (INDEPENDENT_AMBULATORY_CARE_PROVIDER_SITE_OTHER): Payer: Medicare Other | Admitting: Family Medicine

## 2022-07-12 VITALS — BP 134/68 | HR 105 | Temp 97.0°F | Ht 72.0 in | Wt 211.2 lb

## 2022-07-12 DIAGNOSIS — N6321 Unspecified lump in the left breast, upper outer quadrant: Secondary | ICD-10-CM | POA: Diagnosis not present

## 2022-07-12 DIAGNOSIS — M5441 Lumbago with sciatica, right side: Secondary | ICD-10-CM

## 2022-07-12 DIAGNOSIS — M545 Low back pain, unspecified: Secondary | ICD-10-CM | POA: Diagnosis not present

## 2022-07-12 NOTE — Progress Notes (Signed)
[facility] [address] [place] [address] Dept: [phone] Dept Fax: [phone]  Office Visit  Subjective:    Patient ID: Troy [name], male    DOB: 25-Mar-1976, 46 y.o..   MRN: [id number]  Chief Complaint  Patient presents with   Acute Visit    C/o having lump on chest x 6 days.      History of Present Illness:  Patient is in today having noted a recent lump in his left chest area. Troy [name] has a significant past history of cancer, related to Hodgkin's lymphoma, thyroid cancer, and both basal cell and squamous cell skin cancers. He notes concerns for being prone to other cancers. The lump he found is not tender. H states he had something similar about a year ago on the other side.   Additionally, He notes he has been having issues with low back discomfort. He is engaged in power lifting. He has been training up for a competition, so notes he has ramped up the weights he is lifting. He is also on testosterone for hypogonadism. He is concerned that the increased weights is potentially damaging his back. He notes some radiation down into both buttocks. He states he often feels the low back pain, but more recently, it has been a bit persistent.  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   History of splenectomy 02/02/2021   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   Erectile dysfunction 02/02/2021   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   Pulmonary fibrosis (HCC) 02/02/2021   Peripheral neuropathy 02/02/2021   History of hepatitis C 02/02/2021   Back pain 02/02/2021   Hyperlipidemia 02/02/2021    Moderate persistent asthma, uncomplicated 08/13/2020   History of Hodgkin's lymphoma 08/13/2020   Chronic rhinitis 08/13/2020   Not currently working due to disabled status 08/13/2020   Pain in joint of left knee 07/25/2020   History of thyroid cancer 06/09/2020   Arthralgia of left elbow 05/01/2020   Autonomic dysfunction 04/23/2020   Iron deficiency anemia due to chronic blood loss 01/18/2020   Microalbuminuria 01/18/2020   Chronic insomnia 01/17/2020   Large liver 01/17/2020   Prediabetes 01/11/2019   Daytime somnolence 07/22/2017   History of basal cell carcinoma (BCC) 11/30/2016   GERD (gastroesophageal reflux disease) 08/25/2016   Essential hypertension 08/25/2016   Substance induced mood disorder (HCC)    History of stroke    Past Surgical History:  Procedure Laterality Date   COLONOSCOPY  07/04/2019   done for hematochezia and FH col ca-mother: Mod int hem w/inflamm.  No adenomatous polyps.   INCISIONAL HERNIA REPAIR  06/2019   RUQ (prev lap append site)   LAPAROSCOPIC APPENDECTOMY N/A 03/25/2017   Procedure: APPENDECTOMY LAPAROSCOPIC; EXTENSIVE LYSIS OF ADHESIONS FROM PRIOR RADIATION AND SURGERY;  Surgeon: Luretha Murphy, MD;  Location: [facility];  Service: General;  Laterality: N/A;   PARATHYROIDECTOMY     SPLENECTOMY     TEE WITHOUT CARDIOVERSION N/A 07/18/2015   Procedure: TRANSESOPHAGEAL ECHOCARDIOGRAM (TEE);  Surgeon: [name], MD;  Location: [facility] ENDOSCOPY;  Service: Cardiovascular;  Laterality: N/A;   THYROIDECTOMY     Family History  Problem Relation Age of Onset  Breast cancer Mother        dx early 66s   Colon cancer Mother        dx early 22s   Other Mother        NTHL1 homozygous   Multiple myeloma Father        dx 76s   Breast cancer Maternal Aunt    Stroke Maternal Grandmother    Leukemia Other        dx <20; two paternal first cousins once removed   Outpatient Medications Prior to Visit  Medication Sig Dispense Refill   albuterol  (PROVENTIL) (5 MG/ML) 0.5% nebulizer solution      albuterol (VENTOLIN HFA) 108 (90 Base) MCG/ACT inhaler Inhale 2 puffs into the lungs every 4 (four) hours as needed for wheezing or shortness of breath. 1 each 5   ammonium lactate (LAC-HYDRIN) 12 % lotion Apply 1 application topically as needed for dry skin.     aspirin 325 MG tablet Take 1 tablet (325 mg total) by mouth daily. 30 tablet 0   baclofen (LIORESAL) 10 MG tablet Take 1 tablet by mouth three times daily as needed 30 tablet 5   clindamycin (CLEOCIN T) 1 % lotion Apply topically 2 (two) times daily.     doxycycline (VIBRAMYCIN) 100 MG capsule Take 100 mg by mouth 2 (two) times daily.     EPINEPHrine 0.3 mg/0.3 mL IJ SOAJ injection Inject into the muscle.     esomeprazole (NEXIUM) 40 MG capsule TAKE 1 CAPSULE BY MOUTH TWICE DAILY BEFORE A MEAL 180 capsule 3   fluorouracil (EFUDEX) 5 % cream Apply topically twice daily for 4 weeks.     fluticasone (FLONASE) 50 MCG/ACT nasal spray Use 1 spray(s) in each nostril once daily 16 g 11   Fluticasone-Umeclidin-Vilant (TRELEGY ELLIPTA) 200-62.5-25 MCG/ACT AEPB INHALE 1 PUFF INTO THE LUNGS DAILY 60 each 1   gabapentin (NEURONTIN) 300 MG capsule TAKE 4 CAPSULES BY MOUTH IN THE MORNING AND 2 CAPSULES AT NOON AND 2 CAPSULES AT BEDTIME 240 capsule 0   hydrOXYzine (VISTARIL) 50 MG capsule Take 1 capsule (50 mg total) by mouth at bedtime. 90 capsule 3   levothyroxine (EUTHYROX) 200 MCG tablet Take 1 tablet (200 mcg total) by mouth daily before breakfast, except take 2 tablets (400 mcg total) on Sunday and Wednesday. 40 tablet 6   lisinopril (ZESTRIL) 40 MG tablet Take 1 tablet (40 mg total) by mouth daily. 90 tablet 3   LUER LOCK SAFETY SYRINGES 22G X 1-1/2" 3 ML MISC USE FOR SELF ADMINISTER OF TESTOSTERONE 2CC ONCE EVERY 2 WEEKS     Multiple Vitamin (MULTI-VITAMIN) tablet Take 1 tablet by mouth daily.     QUVIVIQ 25 MG TABS TAKE ONE TABLET BY MOUTH AT BEDTIME. 30 tablet 2   rosuvastatin (CRESTOR) 40 MG  tablet Take 1 tablet by mouth once daily 90 tablet 3   tamsulosin (FLOMAX) 0.4 MG CAPS capsule Take 1 capsule (0.4 mg total) by mouth daily. 30 capsule 3   testosterone cypionate (DEPOTESTOSTERONE CYPIONATE) 200 MG/ML injection Inject 0.6 mLs (120 mg total) into the muscle once a week. 10 mL 0   traZODone (DESYREL) 150 MG tablet Take 150 mg by mouth at bedtime. 75 mg Sunday - Thursday. Then Friday, Saturday 150 mg     traZODone (DESYREL) 150 MG tablet Take 1/2 tablet (75 mg total) by mouth at bedtime, except take 1 tablet (150 mg total) on Friday and Saturday. 90 tablet 3   TRELEGY ELLIPTA 200-62.5-25  MCG/ACT AEPB Inhale 1 puff into the lungs daily. 60 each 5   verapamil (VERELAN PM) 180 MG 24 hr capsule Take 1 capsule by mouth at bedtime 90 capsule 0   azelastine (ASTELIN) 0.1 % nasal spray Place 1 spray into both nostrils daily. Use in each nostril as directed 30 mL 5   No facility-administered medications prior to visit.   Allergies  Allergen Reactions   Ciprofloxacin Other (See Comments)    Spontaneous rupture of tendon   Earnestine Leys Officinalis] Anaphylaxis and Hives   Fluocinolone Other (See Comments)    "spontaneous rupture of tendons" per pt dr.     Dione Housekeeper:   Today's Vitals   07/12/22 1430  BP: 134/68  Pulse: (!) 105  Temp: (!) 97 F (36.1 C)  TempSrc: Temporal  SpO2: 95%  Weight: 211 lb 3.2 oz (95.8 kg)  Height: 6' (1.829 m)   Body mass index is 28.64 kg/m.   General: Well developed, well nourished. No acute distress. Chest: There is a small nodular issue within dense stranding in the left breast area. Back: Straight. No tenderness to palpation. Psych: Alert and oriented. Normal mood and affect.  There are no preventive care reminders to display for this patient.  Imaging:   Lumbar x-rays- Normal.  Assessment & Plan:   1. Mass of upper outer quadrant of left breast The area of Mr. Jalali' concern appears to be some dense breast tissue. I recommend we keep  an eye on this for any changes.  2. Acute bilateral low back pain with right-sided sciatica The lumbar x-rays appear normal. I do not see any evidence for injury. I did caution him about using appropriate form while lifting. He is using video of his lifting to review with coaches.  - DG Lumbar Spine Complete  Return if symptoms worsen or fail to improve.   Loyola Mast, MD

## 2022-07-12 NOTE — Progress Notes (Unsigned)
Initial visit for lbp, gradually increasing. Denies injury, pt does powerlifting

## 2022-07-19 ENCOUNTER — Encounter: Payer: Self-pay | Admitting: Family Medicine

## 2022-07-19 ENCOUNTER — Ambulatory Visit (INDEPENDENT_AMBULATORY_CARE_PROVIDER_SITE_OTHER): Payer: Medicare Other | Admitting: Family Medicine

## 2022-07-19 VITALS — BP 130/72 | HR 98 | Temp 97.8°F | Ht 72.0 in | Wt 212.2 lb

## 2022-07-19 DIAGNOSIS — H05231 Hemorrhage of right orbit: Secondary | ICD-10-CM | POA: Diagnosis not present

## 2022-07-19 DIAGNOSIS — M25561 Pain in right knee: Secondary | ICD-10-CM | POA: Diagnosis not present

## 2022-07-19 DIAGNOSIS — M2391 Unspecified internal derangement of right knee: Secondary | ICD-10-CM

## 2022-07-19 NOTE — Patient Instructions (Signed)
Apply cool compresses to eye 3-4 times a day. Use olopatadine (Patadine) eye drops for itchiness.

## 2022-07-19 NOTE — Progress Notes (Signed)
[name] [address] [name] [address] Dept: [phone] Dept Fax: [phone]  Office Visit  Subjective:    Patient ID: Troy [name], male    DOB: [birth year], 46 y.o..   MRN: [id number]  Chief Complaint  Patient presents with   Acute Visit    C/o having RT eye swelling/itching x 1 week and Rt knee pain x 3 days after power lifting.    Has taken Tylenol and Advil for pain     History of Present Illness:  Patient is in today with two issues.  [name] notes he had some eye itchiness last week. He admits that he had been rubbing the eye. He woke up a day or so later with a black eye. He notes the next day after this appeared, he developed puffiness of the eye as well. He denies any trauma to the eye. He is unsure if he might have mild blurriness. present.  [name] also notes he was doing a weightlifting competition this weekend. During a squat, when he had 525 lb. on the bar, he notes her got off on his stance, falling back and towards his right. He thinks this is when he acutely injured his right knee. A while later, he had significant swelling of the knee. He also did a bench press competition. He typically tries to load weight onto his feet and push into the bench with his shoulder. He had his right leg, suddenly lose traction and extend rapidly, which was painful.   [name] gives himself testosterone injections due to hypogonadism resulting from treatment of childhood Hodgkin's lymphoma. He notes that his right thigh injection site has been swollen and tender since his injection earlier this week.  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   History of splenectomy 02/02/2021   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   Erectile dysfunction 02/02/2021   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   Pulmonary fibrosis ([name]) 02/02/2021   Peripheral neuropathy 02/02/2021   History of hepatitis C 02/02/2021   Back pain 02/02/2021   Hyperlipidemia 02/02/2021   Moderate persistent asthma, uncomplicated 69/62/9528   History of Hodgkin's lymphoma 08/13/2020   Chronic rhinitis 08/13/2020   Not currently working due to disabled status 08/13/2020   Pain in joint of left knee 07/25/2020   History of thyroid cancer 06/09/2020   Arthralgia of left elbow 05/01/2020   Autonomic dysfunction 04/23/2020   Iron deficiency anemia due to chronic blood loss 01/18/2020   Microalbuminuria 01/18/2020   Chronic insomnia 01/17/2020   Large liver 01/17/2020   Prediabetes 01/11/2019   Daytime somnolence 07/22/2017   History of basal cell carcinoma (BCC) 11/30/2016   GERD (gastroesophageal reflux disease) 08/25/2016   Essential hypertension 08/25/2016   Substance induced mood disorder ([place])    History of stroke    Past Surgical History:  Procedure Laterality Date   COLONOSCOPY  07/04/2019   done for hematochezia and FH col ca-mother: Mod int hem w/inflamm.  No adenomatous polyps.   INCISIONAL HERNIA REPAIR  06/2019   RUQ (prev lap append site)   LAPAROSCOPIC APPENDECTOMY N/A 03/25/2017   Procedure: APPENDECTOMY LAPAROSCOPIC; EXTENSIVE LYSIS OF ADHESIONS FROM  [name] [address] [name] [address] Dept: [phone] Dept Fax: [phone]  Office Visit  Subjective:    Patient ID: Troy [name], male    DOB: [birth year], 46 y.o..   MRN: [id number]  Chief Complaint  Patient presents with   Acute Visit    C/o having RT eye swelling/itching x 1 week and Rt knee pain x 3 days after power lifting.    Has taken Tylenol and Advil for pain     History of Present Illness:  Patient is in today with two issues.  [name] notes he had some eye itchiness last week. He admits that he had been rubbing the eye. He woke up a day or so later with a black eye. He notes the next day after this appeared, he developed puffiness of the eye as well. He denies any trauma to the eye. He is unsure if he might have mild blurriness. present.  [name] also notes he was doing a weightlifting competition this weekend. During a squat, when he had 525 lb. on the bar, he notes her got off on his stance, falling back and towards his right. He thinks this is when he acutely injured his right knee. A while later, he had significant swelling of the knee. He also did a bench press competition. He typically tries to load weight onto his feet and push into the bench with his shoulder. He had his right leg, suddenly lose traction and extend rapidly, which was painful.   [name] gives himself testosterone injections due to hypogonadism resulting from treatment of childhood Hodgkin's lymphoma. He notes that his right thigh injection site has been swollen and tender since his injection earlier this week.  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   History of splenectomy 02/02/2021   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   Erectile dysfunction 02/02/2021   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   Pulmonary fibrosis ([name]) 02/02/2021   Peripheral neuropathy 02/02/2021   History of hepatitis C 02/02/2021   Back pain 02/02/2021   Hyperlipidemia 02/02/2021   Moderate persistent asthma, uncomplicated 69/62/9528   History of Hodgkin's lymphoma 08/13/2020   Chronic rhinitis 08/13/2020   Not currently working due to disabled status 08/13/2020   Pain in joint of left knee 07/25/2020   History of thyroid cancer 06/09/2020   Arthralgia of left elbow 05/01/2020   Autonomic dysfunction 04/23/2020   Iron deficiency anemia due to chronic blood loss 01/18/2020   Microalbuminuria 01/18/2020   Chronic insomnia 01/17/2020   Large liver 01/17/2020   Prediabetes 01/11/2019   Daytime somnolence 07/22/2017   History of basal cell carcinoma (BCC) 11/30/2016   GERD (gastroesophageal reflux disease) 08/25/2016   Essential hypertension 08/25/2016   Substance induced mood disorder ([place])    History of stroke    Past Surgical History:  Procedure Laterality Date   COLONOSCOPY  07/04/2019   done for hematochezia and FH col ca-mother: Mod int hem w/inflamm.  No adenomatous polyps.   INCISIONAL HERNIA REPAIR  06/2019   RUQ (prev lap append site)   LAPAROSCOPIC APPENDECTOMY N/A 03/25/2017   Procedure: APPENDECTOMY LAPAROSCOPIC; EXTENSIVE LYSIS OF ADHESIONS FROM  [name] [address] [name] [address] Dept: [phone] Dept Fax: [phone]  Office Visit  Subjective:    Patient ID: Troy [name], male    DOB: [birth year], 46 y.o..   MRN: [id number]  Chief Complaint  Patient presents with   Acute Visit    C/o having RT eye swelling/itching x 1 week and Rt knee pain x 3 days after power lifting.    Has taken Tylenol and Advil for pain     History of Present Illness:  Patient is in today with two issues.  [name] notes he had some eye itchiness last week. He admits that he had been rubbing the eye. He woke up a day or so later with a black eye. He notes the next day after this appeared, he developed puffiness of the eye as well. He denies any trauma to the eye. He is unsure if he might have mild blurriness. present.  [name] also notes he was doing a weightlifting competition this weekend. During a squat, when he had 525 lb. on the bar, he notes her got off on his stance, falling back and towards his right. He thinks this is when he acutely injured his right knee. A while later, he had significant swelling of the knee. He also did a bench press competition. He typically tries to load weight onto his feet and push into the bench with his shoulder. He had his right leg, suddenly lose traction and extend rapidly, which was painful.   [name] gives himself testosterone injections due to hypogonadism resulting from treatment of childhood Hodgkin's lymphoma. He notes that his right thigh injection site has been swollen and tender since his injection earlier this week.  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   History of splenectomy 02/02/2021   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   Erectile dysfunction 02/02/2021   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   Pulmonary fibrosis ([name]) 02/02/2021   Peripheral neuropathy 02/02/2021   History of hepatitis C 02/02/2021   Back pain 02/02/2021   Hyperlipidemia 02/02/2021   Moderate persistent asthma, uncomplicated 69/62/9528   History of Hodgkin's lymphoma 08/13/2020   Chronic rhinitis 08/13/2020   Not currently working due to disabled status 08/13/2020   Pain in joint of left knee 07/25/2020   History of thyroid cancer 06/09/2020   Arthralgia of left elbow 05/01/2020   Autonomic dysfunction 04/23/2020   Iron deficiency anemia due to chronic blood loss 01/18/2020   Microalbuminuria 01/18/2020   Chronic insomnia 01/17/2020   Large liver 01/17/2020   Prediabetes 01/11/2019   Daytime somnolence 07/22/2017   History of basal cell carcinoma (BCC) 11/30/2016   GERD (gastroesophageal reflux disease) 08/25/2016   Essential hypertension 08/25/2016   Substance induced mood disorder ([place])    History of stroke    Past Surgical History:  Procedure Laterality Date   COLONOSCOPY  07/04/2019   done for hematochezia and FH col ca-mother: Mod int hem w/inflamm.  No adenomatous polyps.   INCISIONAL HERNIA REPAIR  06/2019   RUQ (prev lap append site)   LAPAROSCOPIC APPENDECTOMY N/A 03/25/2017   Procedure: APPENDECTOMY LAPAROSCOPIC; EXTENSIVE LYSIS OF ADHESIONS FROM

## 2022-07-21 DIAGNOSIS — M25562 Pain in left knee: Secondary | ICD-10-CM | POA: Diagnosis not present

## 2022-07-23 DIAGNOSIS — M25561 Pain in right knee: Secondary | ICD-10-CM | POA: Diagnosis not present

## 2022-07-28 DIAGNOSIS — M25561 Pain in right knee: Secondary | ICD-10-CM | POA: Diagnosis not present

## 2022-07-28 DIAGNOSIS — M94261 Chondromalacia, right knee: Secondary | ICD-10-CM | POA: Diagnosis not present

## 2022-07-28 DIAGNOSIS — M9420 Chondromalacia, unspecified site: Secondary | ICD-10-CM | POA: Insufficient documentation

## 2022-07-28 DIAGNOSIS — M25562 Pain in left knee: Secondary | ICD-10-CM | POA: Diagnosis not present

## 2022-07-30 ENCOUNTER — Other Ambulatory Visit: Payer: Self-pay | Admitting: Family Medicine

## 2022-07-30 DIAGNOSIS — G63 Polyneuropathy in diseases classified elsewhere: Secondary | ICD-10-CM

## 2022-08-05 ENCOUNTER — Telehealth: Payer: Self-pay | Admitting: Family Medicine

## 2022-08-05 ENCOUNTER — Emergency Department (HOSPITAL_BASED_OUTPATIENT_CLINIC_OR_DEPARTMENT_OTHER): Payer: Medicare Other

## 2022-08-05 ENCOUNTER — Other Ambulatory Visit: Payer: Self-pay

## 2022-08-05 ENCOUNTER — Encounter (HOSPITAL_BASED_OUTPATIENT_CLINIC_OR_DEPARTMENT_OTHER): Payer: Self-pay

## 2022-08-05 ENCOUNTER — Emergency Department (HOSPITAL_BASED_OUTPATIENT_CLINIC_OR_DEPARTMENT_OTHER)
Admission: EM | Admit: 2022-08-05 | Discharge: 2022-08-05 | Disposition: A | Discharge status: Home / Self Care | Payer: Medicare Other | Attending: Emergency Medicine | Admitting: Emergency Medicine

## 2022-08-05 DIAGNOSIS — R29818 Other symptoms and signs involving the nervous system: Secondary | ICD-10-CM | POA: Insufficient documentation

## 2022-08-05 DIAGNOSIS — G459 Transient cerebral ischemic attack, unspecified: Secondary | ICD-10-CM | POA: Diagnosis not present

## 2022-08-05 DIAGNOSIS — H538 Other visual disturbances: Secondary | ICD-10-CM | POA: Insufficient documentation

## 2022-08-05 DIAGNOSIS — Z7982 Long term (current) use of aspirin: Secondary | ICD-10-CM | POA: Diagnosis not present

## 2022-08-05 DIAGNOSIS — R4781 Slurred speech: Secondary | ICD-10-CM | POA: Diagnosis not present

## 2022-08-05 DIAGNOSIS — R2990 Unspecified symptoms and signs involving the nervous system: Secondary | ICD-10-CM | POA: Diagnosis not present

## 2022-08-05 DIAGNOSIS — R410 Disorientation, unspecified: Secondary | ICD-10-CM | POA: Diagnosis not present

## 2022-08-05 DIAGNOSIS — R519 Headache, unspecified: Secondary | ICD-10-CM | POA: Diagnosis present

## 2022-08-05 DIAGNOSIS — R000 Tachycardia, unspecified: Secondary | ICD-10-CM | POA: Diagnosis not present

## 2022-08-05 LAB — DIFFERENTIAL
Abs Immature Granulocytes: 0.08 10*3/uL — ABNORMAL HIGH (ref 0.00–0.07)
Basophils Absolute: 0.1 10*3/uL (ref 0.0–0.1)
Basophils Relative: 1 %
Eosinophils Absolute: 0.3 10*3/uL (ref 0.0–0.5)
Eosinophils Relative: 3 %
Immature Granulocytes: 1 %
Lymphocytes Relative: 18 %
Lymphs Abs: 1.9 10*3/uL (ref 0.7–4.0)
Monocytes Absolute: 1.3 10*3/uL — ABNORMAL HIGH (ref 0.1–1.0)
Monocytes Relative: 12 %
Neutro Abs: 7.3 10*3/uL (ref 1.7–7.7)
Neutrophils Relative %: 65 %

## 2022-08-05 LAB — COMPREHENSIVE METABOLIC PANEL
ALT: 80 U/L — ABNORMAL HIGH (ref 0–44)
AST: 62 U/L — ABNORMAL HIGH (ref 15–41)
Albumin: 4.5 g/dL (ref 3.5–5.0)
Alkaline Phosphatase: 73 U/L (ref 38–126)
Anion gap: 13 (ref 5–15)
BUN: 33 mg/dL — ABNORMAL HIGH (ref 6–20)
CO2: 19 mmol/L — ABNORMAL LOW (ref 22–32)
Calcium: 9.7 mg/dL (ref 8.9–10.3)
Chloride: 104 mmol/L (ref 98–111)
Creatinine, Ser: 1.23 mg/dL (ref 0.61–1.24)
GFR, Estimated: >60 mL/min (ref >60)
Glucose, Bld: 93 mg/dL (ref 70–99)
Potassium: 4.5 mmol/L (ref 3.5–5.1)
Sodium: 136 mmol/L (ref 135–145)
Total Bilirubin: 0.3 mg/dL (ref 0.3–1.2)
Total Protein: 7.7 g/dL (ref 6.5–8.1)

## 2022-08-05 LAB — CBC
HCT: 50 % (ref 39.0–52.0)
Hemoglobin: 17.2 g/dL — ABNORMAL HIGH (ref 13.0–17.0)
MCH: 32.6 pg (ref 26.0–34.0)
MCHC: 34.4 g/dL (ref 30.0–36.0)
MCV: 94.7 fL (ref 80.0–100.0)
Platelets: 230 10*3/uL (ref 150–400)
RBC: 5.28 MIL/uL (ref 4.22–5.81)
RDW: 15.4 % (ref 11.5–15.5)
WBC: 11 10*3/uL — ABNORMAL HIGH (ref 4.0–10.5)
nRBC: 0 % (ref 0.0–0.2)

## 2022-08-05 LAB — I-STAT CHEM 8, ED
BUN: 32 mg/dL — ABNORMAL HIGH (ref 6–20)
Calcium, Ion: 1.13 mmol/L — ABNORMAL LOW (ref 1.15–1.40)
Chloride: 107 mmol/L (ref 98–111)
Creatinine, Ser: 1.3 mg/dL — ABNORMAL HIGH (ref 0.61–1.24)
Glucose, Bld: 91 mg/dL (ref 70–99)
HCT: 56 % — ABNORMAL HIGH (ref 39.0–52.0)
Hemoglobin: 19 g/dL — ABNORMAL HIGH (ref 13.0–17.0)
Potassium: 4.2 mmol/L (ref 3.5–5.1)
Sodium: 138 mmol/L (ref 135–145)
TCO2: 21 mmol/L — ABNORMAL LOW (ref 22–32)

## 2022-08-05 LAB — PROTIME-INR
INR: 1.1 (ref 0.8–1.2)
Prothrombin Time: 13.8 seconds (ref 11.4–15.2)

## 2022-08-05 LAB — CBG MONITORING, ED: Glucose-Capillary: 93 mg/dL (ref 70–99)

## 2022-08-05 LAB — APTT: aPTT: 27 seconds (ref 24–36)

## 2022-08-05 LAB — ETHANOL: Alcohol, Ethyl (B): <10 mg/dL (ref <10)

## 2022-08-05 MED ORDER — IOHEXOL 350 MG/ML SOLN
75.0000 mL | Freq: Once | INTRAVENOUS | Status: AC | PRN
  Administered 2022-08-05: 75 mL via INTRAVENOUS

## 2022-08-05 NOTE — Telephone Encounter (Signed)
Caller Name: Bulmaro Feagans Call back phone #: [phone]  Reason for Call: Transferred to triage. He was in the store and is unsure what happened. He said the word stroke, said there was a weird feeling in his brain. I told him he should drive to an urgent care since our office had no openings. He didn't want to do that.

## 2022-08-05 NOTE — Discharge Instructions (Signed)
Your history, exam, work-up today are concerning for possible TIA.  Neurology when I spoke with them was planning on having admitted for MRI and further work-up however as you did not want to do this, we will discharge you.  Please follow-up with a new outpatient neurology team and the PCP.  As her symptoms are improving, please rest and stay hydrated.  If any symptoms change or worsen acutely, please return to the nearest emergency department and ideally a facility that [facility] where we have the neurology team and MRI available.

## 2022-08-05 NOTE — ED Provider Notes (Signed)
[facility] Provider Note   CSN: [id number] Arrival date & time: 08/05/22  1655     History  Chief Complaint  Patient presents with   Neurologic Problem    Troy [name] is a 46 y.o. male.  The history is provided by the patient, medical records and a relative. No language interpreter was used.  Neurologic Problem This is a new problem. The current episode started 1 to 2 hours ago. The problem occurs constantly. The problem has been rapidly improving. Associated symptoms include headaches (discomfort behind r eye). Pertinent negatives include no chest pain, no abdominal pain and no shortness of breath. Nothing aggravates the symptoms. Nothing relieves the symptoms. He has tried nothing for the symptoms. The treatment provided no relief.       Home Medications Prior to Admission medications   Medication Sig Start Date End Date Taking? Authorizing Provider  albuterol (PROVENTIL) (5 MG/ML) 0.5% nebulizer solution     [provider]  albuterol (VENTOLIN HFA) 108 (90 Base) MCG/ACT inhaler Inhale 2 puffs into the lungs every 4 (four) hours as needed for wheezing or shortness of breath. 07/09/22   Valentina Shaggy, MD  ammonium lactate (LAC-HYDRIN) 12 % lotion Apply 1 application topically as needed for dry skin.    [provider]  aspirin 325 MG tablet Take 1 tablet (325 mg total) by mouth daily. 07/18/15   Ghimire, Henreitta Leber, MD  azelastine (ASTELIN) 0.1 % nasal spray Place 1 spray into both nostrils daily. Use in each nostril as directed 10/20/21 07/19/22  Valentina Shaggy, MD  baclofen (LIORESAL) 10 MG tablet Take 1 tablet by mouth three times daily as needed 05/10/22   Haydee Salter, MD  clindamycin (CLEOCIN T) 1 % lotion Apply topically 2 (two) times daily. 05/25/22   [provider]  doxycycline (VIBRAMYCIN) 100 MG capsule Take 100 mg by mouth 2 (two) times daily. 07/07/22   [provider]  EPINEPHrine  0.3 mg/0.3 mL IJ SOAJ injection Inject into the muscle. 06/23/21   [provider]  esomeprazole (NEXIUM) 40 MG capsule TAKE 1 CAPSULE BY MOUTH TWICE DAILY BEFORE A MEAL 01/18/22   Haydee Salter, MD  fluorouracil (EFUDEX) 5 % cream Apply topically twice daily for 4 weeks. 02/18/22   [provider]  fluticasone Asencion Islam) 50 MCG/ACT nasal spray Use 1 spray(s) in each nostril once daily 03/08/22   Haydee Salter, MD  Fluticasone-Umeclidin-Vilant (TRELEGY ELLIPTA) 200-62.5-25 MCG/ACT AEPB INHALE 1 PUFF INTO THE LUNGS DAILY 06/24/22   Valentina Shaggy, MD  gabapentin (NEURONTIN) 300 MG capsule TAKE 4 CAPSULES BY MOUTH IN THE MORNING AND 2 CAPSULES AT NOON AND 2 CAPSULES AT BEDTIME 07/30/22   Libby Maw, MD  hydrOXYzine (VISTARIL) 50 MG capsule Take 1 capsule (50 mg total) by mouth at bedtime. 02/02/21   Haydee Salter, MD  levothyroxine (EUTHYROX) 200 MCG tablet Take 1 tablet (200 mcg total) by mouth daily before breakfast, except take 2 tablets (400 mcg total) on Sunday and Wednesday. 06/02/22   Haydee Salter, MD  lisinopril (ZESTRIL) 40 MG tablet Take 1 tablet (40 mg total) by mouth daily. 03/23/22   Haydee Salter, MD  LUER LOCK SAFETY SYRINGES 22G X 1-1/2" 3 ML MISC USE FOR SELF ADMINISTER OF TESTOSTERONE 2CC ONCE EVERY 2 WEEKS 09/30/20   [provider]  Multiple Vitamin (MULTI-VITAMIN) tablet Take 1 tablet by mouth daily. 07/24/09   [provider]  Vilma Meckel  MG TABS TAKE ONE TABLET BY MOUTH AT BEDTIME. 05/10/22   Haydee Salter, MD  rosuvastatin (CRESTOR) 40 MG tablet Take 1 tablet by mouth once daily 01/18/22   Haydee Salter, MD  tamsulosin (FLOMAX) 0.4 MG CAPS capsule Take 1 capsule (0.4 mg total) by mouth daily. 06/02/22   Haydee Salter, MD  testosterone cypionate (DEPOTESTOSTERONE CYPIONATE) 200 MG/ML injection Inject 0.6 mLs (120 mg total) into the muscle once a week. 06/02/22   Haydee Salter, MD  traZODone (DESYREL) 150 MG tablet Take  150 mg by mouth at bedtime. 75 mg Sunday - Thursday. Then Friday, Saturday 150 mg 04/07/22   [provider]  traZODone (DESYREL) 150 MG tablet Take 1/2 tablet (75 mg total) by mouth at bedtime, except take 1 tablet (150 mg total) on Friday and Saturday. 06/02/22   Haydee Salter, MD  TRELEGY ELLIPTA 200-62.5-25 MCG/ACT AEPB Inhale 1 puff into the lungs daily. 06/24/22   Haydee Salter, MD  verapamil (VERELAN PM) 180 MG 24 hr capsule Take 1 capsule by mouth at bedtime 06/07/22   Haydee Salter, MD      Allergies    Ciprofloxacin, Thelma Barge officinalis], and Fluocinolone    Review of Systems   Review of Systems  Constitutional:  Negative for chills, fatigue and fever.  HENT:  Negative for congestion.   Eyes:  Positive for visual disturbance. Negative for photophobia.  Respiratory:  Negative for cough, chest tightness, shortness of breath and wheezing.   Cardiovascular:  Negative for chest pain, palpitations and leg swelling.  Gastrointestinal:  Negative for abdominal pain, constipation, diarrhea, nausea and vomiting.  Genitourinary:  Negative for dysuria and flank pain.  Musculoskeletal:  Negative for back pain, neck pain and neck stiffness.  Skin:  Negative for rash and wound.  Neurological:  Positive for dizziness, speech difficulty and headaches (discomfort behind r eye). Negative for seizures, facial asymmetry, weakness, light-headedness and numbness.  Psychiatric/Behavioral:  Positive for confusion. Negative for agitation.   All other systems reviewed and are negative.   Physical Exam Updated Vital Signs BP (!) 139/93   Pulse (!) 107   Temp 98.2 F (36.8 C) (Oral)   Resp 18   Ht 6' (1.829 m)   Wt 96.2 kg   SpO2 97%   BMI 28.75 kg/m  Physical Exam Vitals and nursing note reviewed.  Constitutional:      General: He is not in acute distress.    Appearance: He is well-developed. He is not ill-appearing, toxic-appearing or diaphoretic.  HENT:     Head:  Normocephalic and atraumatic.     Nose: Nose normal.     Mouth/Throat:     Mouth: Mucous membranes are moist.  Eyes:     Extraocular Movements: Extraocular movements intact.     Conjunctiva/sclera: Conjunctivae normal.     Pupils: Pupils are equal, round, and reactive to light.  Neck:     Vascular: No carotid bruit.  Cardiovascular:     Rate and Rhythm: Normal rate and regular rhythm.     Heart sounds: No murmur heard. Pulmonary:     Effort: Pulmonary effort is normal. No respiratory distress.     Breath sounds: Normal breath sounds. No wheezing, rhonchi or rales.  Chest:     Chest wall: No tenderness.  Abdominal:     Palpations: Abdomen is soft.     Tenderness: There is no abdominal tenderness. There is no right CVA tenderness, left CVA tenderness, guarding or rebound.  Musculoskeletal:        General: No swelling or tenderness.     Cervical back: Neck supple. No tenderness.     Right lower leg: No edema.     Left lower leg: No edema.  Skin:    General: Skin is warm and dry.     Capillary Refill: Capillary refill takes less than 2 seconds.     Findings: No erythema or rash.  Neurological:     Mental Status: He is alert.     Cranial Nerves: No dysarthria or facial asymmetry.     Sensory: No sensory deficit.     Motor: No weakness, tremor or abnormal muscle tone.     Coordination: Coordination normal. Finger-Nose-Finger Test normal.     Comments: Patient reports globally blurry vision in his right eye compared to left.  Normal extraocular movements and intact visual fields.  Normal sensation and strength in extremities and normal finger-nose-finger testing.  No facial asymmetry.  No dysarthria although family reports that her speech was abnormal when he spoke to her on the phone briefly.  That has resolved.  All symptoms have resolved on my initial exam aside from the right eye blurry vision.  Psychiatric:        Mood and Affect: Mood normal.     ED Results / Procedures /  Treatments   Labs (all labs ordered are listed, but only abnormal results are displayed) Labs Reviewed  CBC - Abnormal; Notable for the following components:      Result Value   WBC 11.0 (*)    Hemoglobin 17.2 (*)    All other components within normal limits  DIFFERENTIAL - Abnormal; Notable for the following components:   Monocytes Absolute 1.3 (*)    Abs Immature Granulocytes 0.08 (*)    All other components within normal limits  COMPREHENSIVE METABOLIC PANEL - Abnormal; Notable for the following components:   CO2 19 (*)    BUN 33 (*)    AST 62 (*)    ALT 80 (*)    All other components within normal limits  I-STAT CHEM 8, ED - Abnormal; Notable for the following components:   BUN 32 (*)    Creatinine, Ser 1.30 (*)    Calcium, Ion 1.13 (*)    TCO2 21 (*)    Hemoglobin 19.0 (*)    HCT 56.0 (*)    All other components within normal limits  ETHANOL  PROTIME-INR  APTT  RAPID URINE DRUG SCREEN, HOSP PERFORMED  URINALYSIS, ROUTINE W REFLEX MICROSCOPIC  CBG MONITORING, ED    EKG EKG Interpretation  Date/Time:  Thursday August 05 2022 17:10:16 EDT Ventricular Rate:  103 PR Interval:  146 QRS Duration: 92 QT Interval:  322 QTC Calculation: 421 R Axis:   52 Text Interpretation: Sinus tachycardia Moderate voltage criteria for LVH, may be normal variant ( Sokolow-Lyon , Cornell product ) Nonspecific T wave abnormality Abnormal ECG When compared with ECG of 19-Dec-2018 11:23, PREVIOUS ECG IS PRESENT When compared to prior, similar appearance. No STEMI Confirmed by Antony Blackbird [phone]) on 08/05/2022 5:12:07 PM  Radiology CT ANGIO HEAD NECK W WO CM (CODE STROKE)  Result Date: 08/05/2022 CLINICAL DATA:  Stroke suspected; confusion, unsteadiness, speech difficulty, blurry vision EXAM: CT ANGIOGRAPHY HEAD AND NECK TECHNIQUE: Multidetector CT imaging of the head and neck was performed using the standard protocol during bolus administration of intravenous contrast. Multiplanar CT  image reconstructions and MIPs were obtained to evaluate the vascular anatomy. Carotid stenosis measurements (  when applicable) are obtained utilizing NASCET criteria, using the distal internal carotid diameter as the denominator. RADIATION DOSE REDUCTION: This exam was performed according to the departmental dose-optimization program which includes automated exposure control, adjustment of the mA and/or kV according to patient size and/or use of iterative reconstruction technique. CONTRAST:  43m OMNIPAQUE IOHEXOL 350 MG/ML SOLN COMPARISON:  CTA head neck 07/18/2015, correlation is also made with CT head 08/05/2022 FINDINGS: CT HEAD FINDINGS For noncontrast findings, please see same day CT head. CTA NECK FINDINGS Aortic arch: Standard branching. Imaged portion shows no evidence of aneurysm or dissection. No significant stenosis of the major arch vessel origins. Right carotid system: No evidence of dissection, occlusion, or hemodynamically significant stenosis (greater than 50%). Left carotid system: No evidence of dissection, occlusion, or hemodynamically significant stenosis (greater than 50%). Vertebral arteries: No evidence of dissection, occlusion, or hemodynamically significant stenosis (greater than 50%). Skeleton: No acute osseous abnormality. Other neck: The thyroid is surgically absent with additional postsurgical changes in the anterior neck. Upper chest: No focal pulmonary opacity or pleural effusion. Review of the MIP images confirms the above findings CTA HEAD FINDINGS Anterior circulation: Both internal carotid arteries are patent to the termini, without significant stenosis. A1 segments patent, diminutive on the right. Normal anterior communicating artery. Anterior cerebral arteries are patent to their distal aspects. No M1 stenosis or occlusion. MCA branches perfused and symmetric. Posterior circulation: Vertebral arteries patent to the vertebrobasilar junction without stenosis. Posterior inferior  cerebellar artery patent on the left. Prominent right AICA. Basilar patent to its distal aspect. Superior cerebellar arteries patent proximally. Patent P1 segments. Evaluation of the more distal PCAs is limited by venous contamination, but the vessels appear perfused to their distal aspects. The bilateral posterior communicating arteries are not visualized. Venous sinuses: As permitted by contrast timing, patent. Anatomic variants: None significant. Review of the MIP images confirms the above findings IMPRESSION: 1. No intracranial large vessel occlusion or significant stenosis. 2. No hemodynamically significant stenosis in the neck. Electronically Signed   By: AMerilyn BabaM.D.   On: 08/05/2022 19:36   CT HEAD CODE STROKE WO CONTRAST  Result Date: 08/05/2022 CLINICAL DATA:  Code stroke.  Acute neuro deficit. EXAM: CT HEAD WITHOUT CONTRAST TECHNIQUE: Contiguous axial images were obtained from the base of the skull through the vertex without intravenous contrast. RADIATION DOSE REDUCTION: This exam was performed according to the departmental dose-optimization program which includes automated exposure control, adjustment of the mA and/or kV according to patient size and/or use of iterative reconstruction technique. COMPARISON:  MRI head 07/17/2015 FINDINGS: Brain: No evidence of acute infarction, hemorrhage, hydrocephalus, extra-axial collection or mass lesion/mass effect. Empty sella, unchanged from the prior study Vascular: Negative for hyperdense vessel Skull: Negative Sinuses/Orbits: Paranasal sinuses clear.  Negative orbit Other: None ASPECTS (AE[place]Stroke Program Early CT Score) - Ganglionic level infarction (caudate, lentiform nuclei, internal capsule, insula, M1-M3 cortex): 7 - Supraganglionic infarction (M4-M6 cortex): 3 Total score (0-10 with 10 being normal): 10 IMPRESSION: 1. Negative CT head 2. ASPECTS is 10 3. These results were called by telephone at the time of interpretation on 08/05/2022 at  5:33 pm to [facility], who verbally acknowledged these results. Electronically Signed   By: CFranchot GalloM.D.   On: 08/05/2022 17:33    Procedures Procedures    Medications Ordered in ED Medications  iohexol (OMNIPAQUE) 350 MG/ML injection 75 mL (75 mLs Intravenous Contrast Given 08/05/22 1832)    ED Course/ Medical Decision Making/ A&P  Medical Decision Making Amount and/or Complexity of Data Reviewed Labs: ordered. Radiology: ordered.    Troy [name] is a 46 y.o. 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  About 3 PM, 30 minutes after his weightlifting, he was at the grocery store when he started having symptoms of significant confusion, unsteadiness, family reports his speech was not normal, and right eye blurry vision.  He reports he had confusion, dizziness, and vision changes.  He reports that the speech, unsteadiness, and confusion has improved but he is still having right eye blurry vision and some feeling that his eye is puffy with some discomfort that is still persistent.  Due to his history of multiple strokes and similar symptoms that have not completely resolved, will activate code stroke.  His last normal was 3 PM.  On my brief exam, he has intact sensation, strength, and pulses in extremities.  Normal finger-nose-finger testing.  Pupils are symmetric and reactive, extract movements.  Symmetric smile.  Speech was clear for me.  Lungs were clear and chest was nontender.   Abdomen nontender.  No carotid bruit on my exam.  Visual fields were intact but he reports that his right eye is blurry compared to what it was before 3 PM.  Due to his persistent right vision change and similar transient symptoms to his previous stroke, will activate code stroke.  8:56 PM CT head and CTA head and neck did not show evidence of acute stroke or LVO.  No evidence of aneurysm or acute bleeding.  Neurology recommended admission for MRI however patient reports his symptoms continue to improve and he wants to go home.  He does not want to be admitted.  He is agreeable to following with outpatient neurology.  Patient understand return precautions follow-up instructions and was discharged in stable condition.        Final Clinical Impression(s) / ED Diagnoses Final diagnoses:  Transient neurologic deficit  Blurry vision, right eye    Rx / DC Orders ED Discharge Orders          Ordered    Ambulatory referral to Neurology       Comments: An appointment is requested in approximately: 1 week (ASAP, refused inpatient stroke workup)   08/05/22 1824           Clinical Impression: 1. Stroke-like episode   2. Transient neurologic deficit   3. Blurry vision, right eye     Disposition: Discharge  Condition: Good  I have discussed the results, Dx and Tx plan with the pt(& family if present). He/she/they expressed understanding and agree(s) with the plan. Discharge instructions discussed at great length. Strict return precautions discussed and pt &/or family have verbalized understanding of the instructions. No further questions at time of discharge.    New Prescriptions   No medications on file    Follow Up: [facility] [address]     [address] [place] [address] [place], [place]    [place] [address] [phone] [place]  [address] [place], [place], [place] [address] [phone]        Tovah Slavick, Gwenyth Allegra, MD 08/05/22 [phone]

## 2022-08-05 NOTE — Telephone Encounter (Signed)
Spoke to patient VIA phone, he states that he felt like everything was weird, couldn't talk, blurry vision all while being in the grocery store 2 hours ago.  He is now only experiencing Rt eye vision issues. After talking to him for about 5 minutes was able to convince him to go to the ER to be checked out.  He is agreeable to do this after he eats something.  Dm/cma

## 2022-08-05 NOTE — Telephone Encounter (Signed)
error 

## 2022-08-05 NOTE — ED Notes (Signed)
Patient transported to CT 

## 2022-08-05 NOTE — Consult Note (Signed)
[facility] Telemedicine Consult   Requesting Provider: Antony Blackbird Consult Participants: Myself,  Location of the provider: [place]  [facility] the [facility]  This consult was provided via telemedicine with 2-way video and audio communication. The patient/family was informed that care would be provided in this way and agreed to receive care in this manner.   Chief Complaint: Troy [name] vision  HPI: 46 y.o male with a past medical history significant for remote Hodgkin's lymphoma, thyroid cancer, asthma, bipolar disorder, pulmonary fibrosis, hypertension, drug abuse, smoking, remote multifocal embolic strokes of undetermined source (October 2016, no residual).  He had sudden onset feeling of his head being "raw" at 3:15 PM with confusion and unsteadiness and speech difficulty as well as right eye blurry vision.  All of the symptoms resolved except for slight blurriness of the right eye vision  Regarding his prior stroke he had presented with speech difficulty, weakness and numbness of the right extremities but no thrombolytic was given due to no objective focal deficits though MRI showed tiny punctate foci in the right frontal lobe and left parietal lobe concerning for acute ischemic infarction.  Echocardiogram was notable for mildly reduced EF 40 to 45% with a small mobile aortic arch plaque.  A1c 5.6%, LDL 145, anticardiolipin antibodies, homocystine, lupus anticoagulant, HIV, Antithrombin III, complement levels all normal  LKW: 3:15 PM Thrombolytic given?: No, too mild to treat, patient reported he would not want to be admitted to the ICU for observation after receiving TNK for this deficit Checklist of contradindications was reviewed.  Risks, benefits and alternatives were discussed IR Thrombectomy? No, exam not consistent with a large vessel occlusion Modified Rankin Scale: 0-Completely asymptomatic and back to baseline post- stroke Time of teleneurologist evaluation:  5:29 PM  Exam: Current vital signs: BP (!) 148/110   Pulse (!) 102   Temp 98.2 F (36.8 C)   Resp 16   Ht 6' (1.829 m)   Wt 96.2 kg   SpO2 98%   BMI 28.75 kg/m  Vital signs in last 24 hours: Temp:  [98.2 F (36.8 C)] 98.2 F (36.8 C) (10/26 1800) Pulse Rate:  [97-107] 102 (10/26 1800) Resp:  [10-18] 16 (10/26 1800) BP: (139-152)/(88-110) 148/110 (10/26 1800) SpO2:  [94 %-98 %] 98 % (10/26 1800) Weight:  [96.2 kg] 96.2 kg (10/26 1707)  General: No acute distress Pulmonary: breathing comfortably Cardiac: regular rate and rhythm on monitor   NIH Stroke scale 1A: Level of Consciousness - 0 1B: Ask Month and Age - 0 1C: 'Blink Eyes' & 'Squeeze Hands' - 0 2: Test Horizontal Extraocular Movements - 0 3: Test Visual Fields - 0 4: Test Facial Palsy - 0 5A: Test Left Arm Motor Drift - 0 5B: Test Right Arm Motor Drift - 0 6A: Test Left Leg Motor Drift - 0 6B: Test Right Leg Motor Drift - 0 7: Test Limb Ataxia - 0 8: Test Sensation - 0 9: Test Language/Aphasia- 0 10: Test Dysarthria - 0 11: Test Extinction/Inattention - 0 NIHSS score: 0  He was able to read the stroke cards with his affected eye, and I confirmed that his right eye vision was monocularly affected.  Administration of saline drops to the eye did not change his vision. 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  However he refuses admission for full stroke work-up, and is able to explain that he understands the risk of having another large stroke especially in the setting of his cardiac function not being normal on the last evaluation, including the risk of death.  He is however amenable to completing the CTA as that can be completed while he is at [employer]  Recommendations:  -No TNK, too mild to treat -CTA head and neck -Close outpatient follow-up with [facility], will place referral to Dr. Leonie Man -Patient stated he is leaving AMA but recommend admission for MRI brain, ECHO, A1c and lipid panel at minimum if he changes his mind, with permissive hypertension to 220/110 for 24-48 hours and OT eval (no PT/SLP unless other symptoms develop)  CRITICAL CARE Performed by: Lorenza Chick   Total critical care time: 50 minutes  Critical care time was exclusive of separately billable procedures and treating other patients.  Critical care was necessary to treat or prevent imminent or life-threatening deterioration, evaluation for acute neurological changes with potential treatment TNK or thrombectomy  Critical care was time spent personally by me on the following activities: development of treatment plan with patient and/or surrogate as well as nursing, discussions with consultants, evaluation of patient's response to treatment, examination of patient, obtaining history from patient or surrogate, ordering and performing treatments and interventions, ordering and review of laboratory studies, ordering and review of radiographic studies, pulse oximetry and re-evaluation of patient's condition.   Lesleigh Noe  MD-PhD [facility] [phone]   If 8pm-8am, please page neurology on call as listed in [place].  CRITICAL CARE Performed by: Lorenza Chick   Total critical care time: 50 minutes  Critical care time was exclusive of separately billable procedures and treating other patients.  Critical care was necessary to treat or prevent imminent or life-threatening deterioration.  Critical care was time spent personally by me on the following activities: development of treatment plan with patient and/or surrogate as well as nursing, discussions with consultants, evaluation of patient's response to treatment, examination of patient, obtaining history from patient or surrogate, ordering and performing treatments and interventions, ordering and review of laboratory studies, ordering and review of radiographic studies, pulse oximetry and re-evaluation of patient's condition.

## 2022-08-05 NOTE — Progress Notes (Signed)
Code stroke activated per elert @ 6384.  Paged DR Curly [place] @ [phone], on camera to assess patient @ 1730.  No lytics d/t non disabling sx.   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

## 2022-08-05 NOTE — Progress Notes (Signed)
2006- updated Bhagat on final read for advanced.

## 2022-08-05 NOTE — ED Triage Notes (Signed)
Reports around 1515 was in the grocery store 30 minutes after lifting weights and became confused with difficulty talking reports his head got "swimmy".  Reports his right eye feels full now.  Reports can see but blurry.

## 2022-08-05 NOTE — ED Notes (Signed)
In radiology

## 2022-08-05 NOTE — Telephone Encounter (Signed)
Triage nurse called, he is refusing to go to urgent care. Wants Dr. Gena Fray to review information

## 2022-08-08 ENCOUNTER — Encounter: Payer: Self-pay | Admitting: Family Medicine

## 2022-08-08 ENCOUNTER — Other Ambulatory Visit: Payer: Self-pay | Admitting: Family Medicine

## 2022-08-08 DIAGNOSIS — F5104 Psychophysiologic insomnia: Secondary | ICD-10-CM

## 2022-08-11 ENCOUNTER — Encounter: Payer: Self-pay | Admitting: Nurse Practitioner

## 2022-08-11 ENCOUNTER — Ambulatory Visit (INDEPENDENT_AMBULATORY_CARE_PROVIDER_SITE_OTHER): Payer: Medicare Other | Admitting: Nurse Practitioner

## 2022-08-11 VITALS — BP 114/78 | HR 98 | Temp 98.0°F | Wt 212.4 lb

## 2022-08-11 DIAGNOSIS — Z8673 Personal history of transient ischemic attack (TIA), and cerebral infarction without residual deficits: Secondary | ICD-10-CM

## 2022-08-11 DIAGNOSIS — H00012 Hordeolum externum right lower eyelid: Secondary | ICD-10-CM

## 2022-08-11 MED ORDER — CEPHALEXIN 500 MG PO CAPS: 500.0000 mg | ORAL_CAPSULE | Freq: Three times a day (TID) | ORAL | 0 refills | Status: DC

## 2022-08-11 NOTE — Progress Notes (Signed)
Acute Office Visit  Subjective:     Patient ID: Troy [name], male    DOB: 05-Jan-1976, 46 y.o.   MRN: [id number]  Chief Complaint  Patient presents with   Stye    Pt c/o recurrent stye on RT eye that has lasted x1 mo. Pt c/o swelling, and abnormal discharge after stye ruptured.     HPI Patient is in today for swelling on his right lower eyelid for a month. He states that he was doing cool compresses which didn't help. He saw a provider on 07/22/22 and was started on keflex QID x7 days. He states the area got smaller, but did not fully go away and then got bigger again. 2 days ago, the bump came to a head and he popped it with white discharge followed by clear drainage. It is smaller than it was, however it is still present. He denies pain, fevers, and blurry vision.   He also went to the ER on 08/05/22 for stroke like symptoms. He was having confusion, difficulty speaking, and blurry vision in his right eye. He had a CT scan which was negative and they wanted to admit him for further testing, but he declined admission because his symptoms resolved. He has an outpatient appointment with neurology in 2 days. He denies any further symptoms.   ROS See pertinent positives and negatives per HPI.     Objective:    BP 114/78   Pulse 98   Temp 98 F (36.7 C) (Temporal)   Wt 212 lb 6.4 oz (96.3 kg)   SpO2 93%   BMI 28.81 kg/m    Physical Exam Vitals and nursing note reviewed.  Constitutional:      Appearance: Normal appearance.  HENT:     Head: Normocephalic.  Eyes:     General:        Right eye: Hordeolum present.     Conjunctiva/sclera: Conjunctivae normal.  Cardiovascular:     Rate and Rhythm: Normal rate and regular rhythm.     Pulses: Normal pulses.     Heart sounds: Murmur heard.  Pulmonary:     Effort: Pulmonary effort is normal.     Breath sounds: Normal breath sounds.  Musculoskeletal:     Cervical back: Normal range of motion.  Skin:    General: Skin is warm.   Neurological:     General: No focal deficit present.     Mental Status: He is alert and oriented to person, place, and time.  Psychiatric:        Mood and Affect: Mood normal.        Behavior: Behavior normal.        Thought Content: Thought content normal.        Judgment: Judgment normal.       Assessment & Plan:   Problem List Items Addressed This Visit   None Visit Diagnoses     Hordeolum externum of right lower eyelid    -  Primary   Hordeolum vs chalazion of right lower lid. Continue warm compresses. Start keflex 500mg  TID x10 days. Referral placed to ophthalmology.    Relevant Medications   cephALEXin (KEFLEX) 500 MG capsule   Other Relevant Orders   Ambulatory referral to Ophthalmology   History of TIA (transient ischemic attack)       Recent TIA symptoms and ER visit. Continue rosuvastatin and aspirin daily and keep appointment with neurology. Recent ER labs reviewed.        Meds ordered  this encounter  Medications   DISCONTD: cephALEXin (KEFLEX) 500 MG capsule    Sig: Take 1 capsule (500 mg total) by mouth 3 (three) times daily.    Dispense:  30 capsule    Refill:  0   cephALEXin (KEFLEX) 500 MG capsule    Sig: Take 1 capsule (500 mg total) by mouth 3 (three) times daily.    Dispense:  30 capsule    Refill:  0    Return if symptoms worsen or fail to improve.  Gerre Scull, NP

## 2022-08-11 NOTE — Patient Instructions (Signed)
It was great to see you!  Start keflex 3 times a day for 10 days. Use warm compresses on your eye. I have placed a referral to an eye doctor for you. They will call to schedule.   Let's follow-up if your symptoms worsen or don't improve.   Take care,  Vance Peper, NP

## 2022-08-11 NOTE — Progress Notes (Signed)
Patient was being evaluated for neurologic deficits and this was in the order set utilized to rule out neurologic etiologies of the complaint.

## 2022-08-12 ENCOUNTER — Telehealth: Payer: Self-pay | Admitting: Family Medicine

## 2022-08-12 NOTE — Telephone Encounter (Signed)
Pt is stating [facility] told him he could not be seen until next year, they told him to get [place] to call [facility] to get a sooner app. Please find another place for him to be seen sooner for his referral to an Ophthalmologist  from 08/11/22. Pt @   [phone]

## 2022-08-13 NOTE — Telephone Encounter (Signed)
Sent to [name] care. Not sure what their appointment time for them. However patient has informed via Mychart of the new location. This letter can also be found under letters in the patient's chart.

## 2022-08-16 DIAGNOSIS — H00021 Hordeolum internum right upper eyelid: Secondary | ICD-10-CM | POA: Diagnosis not present

## 2022-08-16 NOTE — Progress Notes (Addendum)
NEUROLOGY CONSULTATION NOTE  Troy [name] MRN: [id number] DOB: [birth year]  Referring provider: Lynden Oxford, MD (ED referral) Primary care provider: Herbie Drape, MD  Reason for consult:  stroke like symptoms  Assessment/Plan:   Transient neurological symptoms.  Unclear etiology.  No focal/lateralizing symptoms to definitely suggest stroke/TIA.  Consider migraine, however he denies history of headaches/migraines History of stroke  Check MRI of brain.  If findings are positive for an acute/subacute infarct, will proceed with continued workup for stroke (echocardiogram, cardiac event monitor) Further recommendations pending results.   08/17/2022 ADDENDUM:  MRI of brain without contrast on 08/27/2022 personally reviewed showed a nonspecific punctate T2/FLAIR hyperintense focus within the mid-to-posterior right frontal lobe white matter of unknown clinical significance but incidental in relation to his presenting symptoms.  I do not believe he had a TIA or seizure.  Again, possibly migraine, but that would be diagnosis of exclusion.  No further recommendations at this time.     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  History supplemented by ED note.  CT and CTA personally reviewed.  At the gym and later in grocery store and suddenly felt like he was "on drugs" and sound was muffled, right eye vision blurry, pressure behind right eye - hgb A1c 6/30 5.7  felt stuck holding onto the cart but not moving -2 minutes - blurred vision in right eye several hours  On 08/05/2022,  he was walking in the grocery store when he suddenly felt strange.  Sound around him seemed distant.  This lasted about 2 minutes.  He also developed blurred vision and pressure behind his right eye which lasted several hours.  No headache, speech disturbance or unilateral numbness or weakness.  He presented to the ED at [facility].  CT head was negative.  CTA head and neck showed no aneurysm, dissection, LVO or hemodynamically significant stenosis.  Hospital admission for further workup was recommended but patient wished to go home as symptoms significantly improved.  He has not had a recurrence of symptoms.  Denies history of headaches.  He does have history of stroke in October 2016 presenting as aphasia and right sided weakness and numbness.  Admitted to [facility].  MRI of brain showed right frontal and left parietal lobe punctate infarcts.  CTA head and neck negative.  2D echo showed EF 45-50%.  TTE showed no cardiac source of embolism.  Hypercoagulable panel was negative.  He has been on ASA and rosuvastatin since then.      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   Chronic fatigue    Drug abuse (HCC)    Family history of breast cancer 05/19/2021   Family history of colon cancer in mother    GERD (gastroesophageal reflux disease)    +Barretts   Hematochezia    hemorrhoids   Hepatic steatosis    +hepatomegaly   History of CVA (cerebrovascular accident)    radiation-induced carotid damage/changes   History of hepatitis C    +treated   History  of Hodgkin's lymphoma    age 69->mantle RT + splenectomy   History of splenectomy    Hyperlipidemia    Hypertension    Insomnia    Male hypogonadism    Moderate persistent asthma    Obesity, Class I, BMI 30-34.9    Papillary thyroid carcinoma (HCC) 2010   total thyroidectomy + neck dissection   Postsurgical hypothyroidism     Prediabetes    Pulmonary fibrosis (HCC)    ?rad induced?    PAST SURGICAL HISTORY: Past Surgical History:  Procedure Laterality Date   COLONOSCOPY  07/04/2019   done for hematochezia and FH col ca-mother: Mod int hem w/inflamm.  No adenomatous polyps.   INCISIONAL HERNIA REPAIR  06/2019   RUQ (prev lap append site)   LAPAROSCOPIC APPENDECTOMY N/A 03/25/2017   Procedure: APPENDECTOMY LAPAROSCOPIC; EXTENSIVE LYSIS OF ADHESIONS FROM PRIOR RADIATION AND SURGERY;  Surgeon: Luretha Murphy, MD;  Location: [facility];  Service: General;  Laterality: N/A;   PARATHYROIDECTOMY     SPLENECTOMY     TEE WITHOUT CARDIOVERSION N/A 07/18/2015   Procedure: TRANSESOPHAGEAL ECHOCARDIOGRAM (TEE);  Surgeon: [name], MD;  Location: [facility] ENDOSCOPY;  Service: Cardiovascular;  Laterality: N/A;   THYROIDECTOMY      MEDICATIONS: Current Outpatient Medications on File Prior to Visit  Medication Sig Dispense Refill   albuterol (PROVENTIL) (5 MG/ML) 0.5% nebulizer solution      albuterol (VENTOLIN HFA) 108 (90 Base) MCG/ACT inhaler Inhale 2 puffs into the lungs every 4 (four) hours as needed for wheezing or shortness of breath. 1 each 5   ammonium lactate (LAC-HYDRIN) 12 % lotion Apply 1 application topically as needed for dry skin.     aspirin 325 MG tablet Take 1 tablet (325 mg total) by mouth daily. 30 tablet 0   azelastine (ASTELIN) 0.1 % nasal spray Place 1 spray into both nostrils daily. Use in each nostril as directed 30 mL 5   baclofen (LIORESAL) 10 MG tablet Take 1 tablet by mouth three times daily as needed 30 tablet 5   cephALEXin (KEFLEX) 500 MG capsule Take 1 capsule (500 mg total) by mouth 3 (three) times daily. 30 capsule 0   clindamycin (CLEOCIN T) 1 % lotion Apply topically 2 (two) times daily.     doxycycline (VIBRAMYCIN) 100 MG capsule Take 100 mg by mouth 2 (two) times daily.     EPINEPHrine 0.3 mg/0.3 mL IJ SOAJ injection Inject into the muscle.     esomeprazole (NEXIUM) 40 MG capsule TAKE 1  CAPSULE BY MOUTH TWICE DAILY BEFORE A MEAL 180 capsule 3   fluorouracil (EFUDEX) 5 % cream Apply topically twice daily for 4 weeks.     fluticasone (FLONASE) 50 MCG/ACT nasal spray Use 1 spray(s) in each nostril once daily 16 g 11   Fluticasone-Umeclidin-Vilant (TRELEGY ELLIPTA) 200-62.5-25 MCG/ACT AEPB INHALE 1 PUFF INTO THE LUNGS DAILY 60 each 1   gabapentin (NEURONTIN) 300 MG capsule TAKE 4 CAPSULES BY MOUTH IN THE MORNING AND 2 CAPSULES AT NOON AND 2 CAPSULES AT BEDTIME 240 capsule 0   hydrOXYzine (VISTARIL) 50 MG capsule Take 1 capsule (50 mg total) by mouth at bedtime. 90 capsule 3   levothyroxine (EUTHYROX) 200 MCG tablet Take 1 tablet (200 mcg total) by mouth daily before breakfast, except take 2 tablets (400 mcg total) on Sunday and Wednesday. 40 tablet 6   lisinopril (ZESTRIL) 40 MG tablet Take 1 tablet (40 mg total) by mouth daily. 90 tablet 3   LUER LOCK SAFETY  SYRINGES 22G X 1-1/2" 3 ML MISC USE FOR SELF ADMINISTER OF TESTOSTERONE 2CC ONCE EVERY 2 WEEKS     Multiple Vitamin (MULTI-VITAMIN) tablet Take 1 tablet by mouth daily.     QUVIVIQ 25 MG TABS TAKE 1 TABLET BY MOUTH AT BEDTIME 30 tablet 2   rosuvastatin (CRESTOR) 40 MG tablet Take 1 tablet by mouth once daily 90 tablet 3   tamsulosin (FLOMAX) 0.4 MG CAPS capsule Take 1 capsule (0.4 mg total) by mouth daily. 30 capsule 3   testosterone cypionate (DEPOTESTOSTERONE CYPIONATE) 200 MG/ML injection Inject 0.6 mLs (120 mg total) into the muscle once a week. 10 mL 0   traZODone (DESYREL) 150 MG tablet Take 150 mg by mouth at bedtime. 75 mg Sunday - Thursday. Then Friday, Saturday 150 mg     traZODone (DESYREL) 150 MG tablet Take 1/2 tablet (75 mg total) by mouth at bedtime, except take 1 tablet (150 mg total) on Friday and Saturday. 90 tablet 3   TRELEGY ELLIPTA 200-62.5-25 MCG/ACT AEPB Inhale 1 puff into the lungs daily. 60 each 5   verapamil (VERELAN PM) 180 MG 24 hr capsule Take 1 capsule by mouth at bedtime 90 capsule 0   No current  facility-administered medications on file prior to visit.    ALLERGIES: Allergies  Allergen Reactions   Ciprofloxacin Other (See Comments)    Spontaneous rupture of tendon   Earnestine Leys Officinalis] Anaphylaxis and Hives   Fluocinolone Other (See Comments)    "spontaneous rupture of tendons" per pt dr.    Meryle Ready HISTORY: Family History  Problem Relation Age of Onset   Breast cancer Mother        dx early 71s   Colon cancer Mother        dx early 3s   Other Mother        NTHL1 homozygous   Multiple myeloma Father        dx 30s   Breast cancer Maternal Aunt    Stroke Maternal Grandmother    Leukemia Other        dx <20; two paternal first cousins once removed    Objective:  Blood pressure 128/82, pulse (!) 103, height 6' (1.829 m), weight 206 lb 3.2 oz (93.5 kg), SpO2 95 %. General: No acute distress.  Patient appears well-groomed.   Head:  Normocephalic/atraumatic Eyes:  fundi examined but not visualized Neck: supple, no paraspinal tenderness, full range of motion Back: No paraspinal tenderness Heart: regular rate and rhythm Lungs: Clear to auscultation bilaterally. Vascular: No carotid bruits. Neurological Exam: Mental status: alert and oriented to person, place, and time, speech fluent and not dysarthric, language intact. Cranial nerves: CN I: not tested CN II: pupils equal, round and reactive to light, visual fields intact CN III, IV, VI:  full range of motion, no nystagmus, no ptosis CN V: facial sensation intact. CN VII: upper and lower face symmetric CN VIII: hearing intact CN IX, X: gag intact, uvula midline CN XI: sternocleidomastoid and trapezius muscles intact CN XII: tongue midline Bulk & Tone: normal, no fasciculations. Motor:  muscle strength 5/5 throughout Sensation:  Pinprick, temperature and vibratory sensation intact. Deep Tendon Reflexes:  2+ throughout,  toes downgoing.   Finger to nose testing:  Without dysmetria.   Heel to shin:  Without  dysmetria.   Gait:  Normal station and stride.  Romberg negative.    Thank you for allowing me to take part in the care of this patient.  Shon Millet, DO  CC: Herbie Drape, MD

## 2022-08-17 ENCOUNTER — Ambulatory Visit (INDEPENDENT_AMBULATORY_CARE_PROVIDER_SITE_OTHER): Payer: Medicare Other | Admitting: Neurology

## 2022-08-17 ENCOUNTER — Encounter: Payer: Self-pay | Admitting: Neurology

## 2022-08-17 VITALS — BP 128/82 | HR 103 | Ht 72.0 in | Wt 206.2 lb

## 2022-08-17 DIAGNOSIS — Z8673 Personal history of transient ischemic attack (TIA), and cerebral infarction without residual deficits: Secondary | ICD-10-CM | POA: Diagnosis not present

## 2022-08-17 DIAGNOSIS — R29818 Other symptoms and signs involving the nervous system: Secondary | ICD-10-CM | POA: Diagnosis not present

## 2022-08-17 NOTE — Patient Instructions (Signed)
Not sure if this was a stroke.  We will need to check MRI of brain .  Further recommendations pending results.

## 2022-08-18 NOTE — Telephone Encounter (Signed)
[facility] is needing pt's Demographic sheet. Fax [phone].

## 2022-08-23 DIAGNOSIS — H0012 Chalazion right lower eyelid: Secondary | ICD-10-CM | POA: Diagnosis not present

## 2022-08-25 ENCOUNTER — Encounter: Payer: Self-pay | Admitting: Family Medicine

## 2022-08-25 DIAGNOSIS — Z8673 Personal history of transient ischemic attack (TIA), and cerebral infarction without residual deficits: Secondary | ICD-10-CM

## 2022-08-25 DIAGNOSIS — R9089 Other abnormal findings on diagnostic imaging of central nervous system: Secondary | ICD-10-CM

## 2022-08-27 ENCOUNTER — Other Ambulatory Visit: Payer: Self-pay | Admitting: Family Medicine

## 2022-08-27 ENCOUNTER — Ambulatory Visit
Admission: RE | Admit: 2022-08-27 | Discharge: 2022-08-27 | Disposition: A | Discharge status: Home / Self Care | Payer: Medicare Other | Source: Ambulatory Visit | Attending: Neurology | Admitting: Neurology

## 2022-08-27 DIAGNOSIS — R29818 Other symptoms and signs involving the nervous system: Secondary | ICD-10-CM

## 2022-08-27 DIAGNOSIS — G63 Polyneuropathy in diseases classified elsewhere: Secondary | ICD-10-CM

## 2022-08-27 DIAGNOSIS — R9082 White matter disease, unspecified: Secondary | ICD-10-CM | POA: Diagnosis not present

## 2022-08-27 DIAGNOSIS — Z8673 Personal history of transient ischemic attack (TIA), and cerebral infarction without residual deficits: Secondary | ICD-10-CM

## 2022-08-28 ENCOUNTER — Other Ambulatory Visit: Payer: Self-pay | Admitting: Allergy & Immunology

## 2022-08-30 NOTE — Addendum Note (Signed)
Addended by: Haydee Salter on: 08/30/2022 06:26 PM   Modules accepted: Orders

## 2022-08-31 ENCOUNTER — Telehealth: Payer: Self-pay | Admitting: Neurology

## 2022-08-31 ENCOUNTER — Telehealth: Payer: Self-pay

## 2022-08-31 NOTE — Progress Notes (Signed)
Patient advised of his MRI results.

## 2022-08-31 NOTE — Telephone Encounter (Signed)
Patient needs to see the Neurologist due to a Recent MRI that had abnormal findings on it.  Dr Gena Fray needs him to be seen again.  Please call the patient to scheduled an appointment.   Thanks. Marland Kitchen  Dm/cma

## 2022-08-31 NOTE — Telephone Encounter (Signed)
Patient left a VM about calling back to get results  please call

## 2022-08-31 NOTE — Progress Notes (Signed)
Tried calling patient no answer, LMOVM for patient to call the office back.

## 2022-08-31 NOTE — Telephone Encounter (Signed)
Patient has been called.

## 2022-09-01 ENCOUNTER — Ambulatory Visit: Payer: Medicare Other | Admitting: Family Medicine

## 2022-09-05 ENCOUNTER — Other Ambulatory Visit: Payer: Self-pay | Admitting: Family Medicine

## 2022-09-05 DIAGNOSIS — I10 Essential (primary) hypertension: Secondary | ICD-10-CM

## 2022-09-09 DIAGNOSIS — H0012 Chalazion right lower eyelid: Secondary | ICD-10-CM | POA: Diagnosis not present

## 2022-09-10 NOTE — Telephone Encounter (Signed)
Pt has been schedule for an appointment on 09/15/2022 at 1:20 pm  to discuss referral to Cardiologist.

## 2022-09-15 ENCOUNTER — Ambulatory Visit (INDEPENDENT_AMBULATORY_CARE_PROVIDER_SITE_OTHER): Payer: Medicare Other | Admitting: Family Medicine

## 2022-09-15 ENCOUNTER — Encounter: Payer: Self-pay | Admitting: Family Medicine

## 2022-09-15 VITALS — BP 126/70 | HR 104 | Temp 97.4°F | Ht 72.0 in | Wt 214.0 lb

## 2022-09-15 DIAGNOSIS — R42 Dizziness and giddiness: Secondary | ICD-10-CM | POA: Diagnosis not present

## 2022-09-15 DIAGNOSIS — L84 Corns and callosities: Secondary | ICD-10-CM

## 2022-09-15 DIAGNOSIS — R9430 Abnormal result of cardiovascular function study, unspecified: Secondary | ICD-10-CM | POA: Diagnosis not present

## 2022-09-15 DIAGNOSIS — E890 Postprocedural hypothyroidism: Secondary | ICD-10-CM | POA: Diagnosis not present

## 2022-09-15 NOTE — Progress Notes (Signed)
[facility] [address] [place] [address] Dept: [phone] Dept Fax: [phone]  Office Visit  Subjective:    Patient ID: Troy [name], male    DOB: 1976/05/16, 46 y.o..   MRN: [id number]  Chief Complaint  Patient presents with   Follow-up    C/o still having light-headedness, dizziness, elevated HR.  Wants referral to Cardiology. Also has a spot on RT palm from weight lifting.      History of Present Illness:  Patient is in today with complaints of ongoing lightheadedness. He notes he has been experiencing this ever since he had an acute neurological event on 08/05/2022. He was seen at the time in the ED and had a CT of the head. He then followed up with Dr. Everlena Cooper (neurology) and had an MRI scan. Dr. Everlena Cooper noted a benign finding and nothing acute. Troy [name] feels dissatisfied with this, as he feels the issue is not being considered fully. He does recall having past heart issues and wonders if this might be involved in what is going on. He notes a past history of an ectopic atrial rhythm, tachycardia, cardiomegaly, regurgitation of multiple valves, and a hear murmur.  Troy [name] has a history of a prior thyroidectomy. He is managed by Dr. Kathreen Cosier on levothyroxine 200 mcg daily, except 400 mg on W/Su. He notes he had been taking high levels of biotin in the past, which may have interfered with past thyroid tests. He feels strongly that his current issues are not from being on too high of a levothyroxine dose.  Troy [name] also notes that he has calluses on his hands, with one that has split. This has been causing him some significant pain. He would like to have Dermabond applied to this.  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   History of splenectomy 02/02/2021   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   Erectile dysfunction 02/02/2021   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   Pulmonary fibrosis (HCC) 02/02/2021   Peripheral neuropathy 02/02/2021   History of hepatitis C 02/02/2021   Back pain 02/02/2021   Hyperlipidemia 02/02/2021   Moderate persistent asthma, uncomplicated 08/13/2020   History of Hodgkin's lymphoma 08/13/2020   Chronic rhinitis 08/13/2020   Not currently working due to disabled status 08/13/2020   Pain in joint of left knee 07/25/2020   History of thyroid cancer 06/09/2020   Arthralgia of left elbow 05/01/2020   Autonomic dysfunction 04/23/2020   Iron deficiency anemia due to chronic blood loss 01/18/2020   Microalbuminuria 01/18/2020   Chronic insomnia 01/17/2020   Large liver 01/17/2020   Prediabetes 01/11/2019   Daytime somnolence 07/22/2017   History of basal cell carcinoma (BCC) 11/30/2016   GERD (gastroesophageal reflux disease) 08/25/2016   Essential hypertension 08/25/2016   Substance induced mood disorder (HCC)    History of stroke    Past Surgical History:  Procedure Laterality Date   COLONOSCOPY  07/04/2019   done for hematochezia and FH col ca-mother: Mod int hem w/inflamm.  No adenomatous polyps.   INCISIONAL HERNIA REPAIR  06/2019   RUQ (prev lap append site)   LAPAROSCOPIC APPENDECTOMY N/A 03/25/2017  Procedure: APPENDECTOMY LAPAROSCOPIC; EXTENSIVE LYSIS OF ADHESIONS FROM PRIOR RADIATION AND SURGERY;  Surgeon: Luretha Murphy, MD;  Location: [facility];  Service: General;  Laterality: N/A;   PARATHYROIDECTOMY     SPLENECTOMY     TEE WITHOUT CARDIOVERSION N/A 07/18/2015   Procedure: TRANSESOPHAGEAL ECHOCARDIOGRAM (TEE);  Surgeon: [name], MD;  Location: [facility] ENDOSCOPY;  Service: Cardiovascular;  Laterality: N/A;    THYROIDECTOMY     Family History  Problem Relation Age of Onset   Breast cancer Mother        dx early 48s   Colon cancer Mother        dx early 93s   Other Mother        NTHL1 homozygous   Parkinsonism Father    Multiple myeloma Father        dx 60s   Breast cancer Maternal Aunt    Stroke Maternal Grandmother    Leukemia Other        dx <20; two paternal first cousins once removed   Outpatient Medications Prior to Visit  Medication Sig Dispense Refill   albuterol (PROVENTIL) (5 MG/ML) 0.5% nebulizer solution      ammonium lactate (LAC-HYDRIN) 12 % lotion Apply 1 application topically as needed for dry skin.     aspirin 325 MG tablet Take 1 tablet (325 mg total) by mouth daily. 30 tablet 0   baclofen (LIORESAL) 10 MG tablet Take 1 tablet by mouth three times daily as needed 30 tablet 5   cephALEXin (KEFLEX) 500 MG capsule Take 1 capsule (500 mg total) by mouth 3 (three) times daily. 30 capsule 0   clindamycin (CLEOCIN T) 1 % lotion Apply topically 2 (two) times daily.     doxycycline (VIBRAMYCIN) 100 MG capsule Take 100 mg by mouth 2 (two) times daily.     EPINEPHrine 0.3 mg/0.3 mL IJ SOAJ injection Inject into the muscle.     erythromycin ophthalmic ointment SMARTSIG:In Eye(s)     esomeprazole (NEXIUM) 40 MG capsule TAKE 1 CAPSULE BY MOUTH TWICE DAILY BEFORE A MEAL 180 capsule 3   fluorouracil (EFUDEX) 5 % cream Apply topically twice daily for 4 weeks.     fluticasone (FLONASE) 50 MCG/ACT nasal spray Use 1 spray(s) in each nostril once daily 16 g 11   Fluticasone-Umeclidin-Vilant (TRELEGY ELLIPTA) 200-62.5-25 MCG/ACT AEPB INHALE 1 PUFF INTO THE LUNGS DAILY 60 each 1   gabapentin (NEURONTIN) 300 MG capsule TAKE 4 CAPSULES BY MOUTH IN THE MORNING AND 2 CAPSULES AT NOON AND 2 CAPSULES AT BEDTIME 240 capsule 0   hydrOXYzine (VISTARIL) 50 MG capsule Take 1 capsule (50 mg total) by mouth at bedtime. 90 capsule 3   levothyroxine (EUTHYROX) 200 MCG tablet Take 1 tablet (200 mcg total)  by mouth daily before breakfast, except take 2 tablets (400 mcg total) on Sunday and Wednesday. 40 tablet 6   lisinopril (ZESTRIL) 40 MG tablet Take 1 tablet (40 mg total) by mouth daily. 90 tablet 3   LUER LOCK SAFETY SYRINGES 22G X 1-1/2" 3 ML MISC USE FOR SELF ADMINISTER OF TESTOSTERONE 2CC ONCE EVERY 2 WEEKS     Multiple Vitamin (MULTI-VITAMIN) tablet Take 1 tablet by mouth daily.     QUVIVIQ 25 MG TABS TAKE 1 TABLET BY MOUTH AT BEDTIME 30 tablet 2   rosuvastatin (CRESTOR) 40 MG tablet Take 1 tablet by mouth once daily 90 tablet 3   tamsulosin (FLOMAX) 0.4 MG CAPS capsule Take 1 capsule (0.4 mg total) by mouth daily. 30  capsule 3   testosterone cypionate (DEPOTESTOSTERONE CYPIONATE) 200 MG/ML injection Inject 0.6 mLs (120 mg total) into the muscle once a week. 10 mL 0   traZODone (DESYREL) 150 MG tablet Take 150 mg by mouth at bedtime. 75 mg Sunday - Thursday. Then Friday, Saturday 150 mg     traZODone (DESYREL) 150 MG tablet Take 1/2 tablet (75 mg total) by mouth at bedtime, except take 1 tablet (150 mg total) on Friday and Saturday. 90 tablet 3   TRELEGY ELLIPTA 200-62.5-25 MCG/ACT AEPB Inhale 1 puff into the lungs daily. 60 each 5   VENTOLIN HFA 108 (90 Base) MCG/ACT inhaler INHALE 2 PUFFS BY MOUTH EVERY 6 HOURS AS NEEDED FOR SHORTNESS OF BREATH 18 g 1   verapamil (VERELAN PM) 180 MG 24 hr capsule Take 1 capsule by mouth at bedtime 90 capsule 3   azelastine (ASTELIN) 0.1 % nasal spray Place 1 spray into both nostrils daily. Use in each nostril as directed 30 mL 5   No facility-administered medications prior to visit.   Allergies  Allergen Reactions   Ciprofloxacin Other (See Comments)    Spontaneous rupture of tendon   Earnestine Leys Officinalis] Anaphylaxis and Hives   Fluocinolone Other (See Comments)    "spontaneous rupture of tendons" per pt dr.     Dione Housekeeper:   Today's Vitals   09/15/22 1315  BP: 126/70  Pulse: (!) 104  Temp: (!) 97.4 F (36.3 C)  TempSrc: Temporal  SpO2:  94%  Weight: 214 lb (97.1 kg)  Height: 6' (1.829 m)   Body mass index is 29.02 kg/m.   General: Well developed, well nourished. No acute distress. Skin: Warm and dry. There are large calluses in both palms near the base of the digits. One   of these does appear to have a split in the callus. No bleeding noted. Neuro: Mild fine tremor noted. Psych: Alert and oriented. Normal mood and affect.  Health Maintenance Due  Topic Date Due   Medicare Annual Wellness (AWV)  05/22/2021   COVID-19 Vaccine (5 - 2023-24 season) 08/30/2022     Imaging: Echocardiogram (07/17/2015) Study Conclusions   - Left ventricle: The cavity size was normal. Wall thickness was increased in a pattern of moderate LVH. Systolic function was mildly reduced. The estimated ejection fraction was in the range of 45% to 50%. Wall motion was normal; there were no regional wall motion abnormalities. Left ventricular diastolic function  parameters were normal.  - Aortic valve: There was mild to moderate regurgitation.  - Left atrium: The atrium was mildly dilated.   MR of Brain 20 contrast (08/27/2022) IMPRESSION: No evidence of acute intracranial abnormality. Specifically, there is no evidence of acute or recent subacute infarction.   Nonspecific 5 mm T2 FLAIR hyperintense remote insult within the mid-to-posterior right frontal lobe white matter, new from the prior brain MRI of 07/17/2015.  Assessment & Plan:   1. Lightheadedness 2. Low left ventricular ejection fraction It is unclear as to the etiology of the transient neurological event. In light of the past findings on an echocardiogram in 2016, I do think it reasonable that he go back to cardiology for assessment.  - Ambulatory referral to Cardiology  3. Postoperative hypothyroidism I do strongly recommend that we reassess the TSH. If her is being overmedicated, that could account for the symptoms of tachycardia, lightheadedness, and fine tremor. Mr. Umbach feels  this is a waste of time.  - TSH  4. Callus of palm of hand I applied  Dermabond to the open areas on the callus. Reviewed wound care. I recommended he try using lifting gloves, which he declines to use, noting he would be ridiculed in the gym.   Return if symptoms worsen or fail to improve.   Loyola Mast, MD

## 2022-09-16 LAB — TSH: TSH: 3.84 u[IU]/mL (ref 0.35–5.50)

## 2022-09-18 ENCOUNTER — Other Ambulatory Visit: Payer: Self-pay | Admitting: Family Medicine

## 2022-09-18 DIAGNOSIS — R399 Unspecified symptoms and signs involving the genitourinary system: Secondary | ICD-10-CM

## 2022-09-20 ENCOUNTER — Encounter: Payer: Self-pay | Admitting: Family Medicine

## 2022-09-22 ENCOUNTER — Encounter: Payer: Self-pay | Admitting: Family Medicine

## 2022-09-22 ENCOUNTER — Ambulatory Visit (INDEPENDENT_AMBULATORY_CARE_PROVIDER_SITE_OTHER): Payer: Medicare Other | Admitting: Family Medicine

## 2022-09-22 VITALS — BP 136/76 | HR 108 | Temp 98.2°F | Ht 72.0 in | Wt 215.2 lb

## 2022-09-22 DIAGNOSIS — F5104 Psychophysiologic insomnia: Secondary | ICD-10-CM

## 2022-09-22 DIAGNOSIS — H6506 Acute serous otitis media, recurrent, bilateral: Secondary | ICD-10-CM

## 2022-09-22 NOTE — Progress Notes (Signed)
[facility] [address] [place] [address] Dept: [phone] Dept Fax: [phone]  Office Visit  Subjective:    Patient ID: Troy [name], male    DOB: 04-05-1976, 46 y.o..   MRN: [id number]  Chief Complaint  Patient presents with   Acute Visit    C/o having getting water in  RT ear that caused him to have N&V on Monday.        History of Present Illness:  Patient is in today for evaluation of his ears. Recently, Troy [name] got water in his ear while showering. This set off a reaction to cause him to vomit several times. He tried to use a product to clear out the ears (designed for swimmer's ear). He had a sudden taste of alcohol in his throat, which caused him to gag further. Troy [name] has a history of chronic rhinitis and is on daily Flonase spray and azelastine. He denies putting cotton swabs in his ears and is not aware of other traumas.  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   History of splenectomy 02/02/2021   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   Erectile dysfunction 02/02/2021   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   Pulmonary fibrosis (HCC) 02/02/2021   Peripheral neuropathy 02/02/2021   History of hepatitis C 02/02/2021   Back pain 02/02/2021   Hyperlipidemia 02/02/2021   Moderate persistent asthma, uncomplicated 08/13/2020   History of Hodgkin's lymphoma 08/13/2020   Chronic rhinitis 08/13/2020   Not currently working due to disabled status 08/13/2020   Pain in joint of left knee 07/25/2020   History of thyroid cancer 06/09/2020   Arthralgia of  left elbow 05/01/2020   Autonomic dysfunction 04/23/2020   Iron deficiency anemia due to chronic blood loss 01/18/2020   Microalbuminuria 01/18/2020   Chronic insomnia 01/17/2020   Large liver 01/17/2020   Prediabetes 01/11/2019   Daytime somnolence 07/22/2017   History of basal cell carcinoma (BCC) 11/30/2016   GERD (gastroesophageal reflux disease) 08/25/2016   Essential hypertension 08/25/2016   Substance induced mood disorder (HCC)    History of stroke    Past Surgical History:  Procedure Laterality Date   COLONOSCOPY  07/04/2019   done for hematochezia and FH col ca-mother: Mod int hem w/inflamm.  No adenomatous polyps.   INCISIONAL HERNIA REPAIR  06/2019   RUQ (prev lap append site)   LAPAROSCOPIC APPENDECTOMY N/A 03/25/2017   Procedure: APPENDECTOMY LAPAROSCOPIC; EXTENSIVE LYSIS OF ADHESIONS FROM PRIOR RADIATION AND SURGERY;  Surgeon: Luretha Murphy, MD;  Location: [facility];  Service: General;  Laterality: N/A;   PARATHYROIDECTOMY     SPLENECTOMY     TEE WITHOUT CARDIOVERSION N/A 07/18/2015   Procedure: TRANSESOPHAGEAL ECHOCARDIOGRAM (TEE);  Surgeon: [name], MD;  Location: [facility] ENDOSCOPY;  Service: Cardiovascular;  Laterality: N/A;   THYROIDECTOMY     Family History  Problem Relation Age of Onset   Breast cancer Mother        dx early 25s   Colon cancer Mother        dx early 29s   Other Mother        NTHL1 homozygous  Parkinsonism Father    Multiple myeloma Father        dx 74s   Breast cancer Maternal Aunt    Stroke Maternal Grandmother    Leukemia Other        dx <20; two paternal first cousins once removed   Outpatient Medications Prior to Visit  Medication Sig Dispense Refill   albuterol (PROVENTIL) (5 MG/ML) 0.5% nebulizer solution      ammonium lactate (LAC-HYDRIN) 12 % lotion Apply 1 application topically as needed for dry skin.     aspirin 325 MG tablet Take 1 tablet (325 mg total) by mouth daily. 30 tablet 0   baclofen (LIORESAL) 10 MG tablet  Take 1 tablet by mouth three times daily as needed 30 tablet 5   cephALEXin (KEFLEX) 500 MG capsule Take 1 capsule (500 mg total) by mouth 3 (three) times daily. 30 capsule 0   clindamycin (CLEOCIN T) 1 % lotion Apply topically 2 (two) times daily.     doxycycline (VIBRAMYCIN) 100 MG capsule Take 100 mg by mouth 2 (two) times daily.     EPINEPHrine 0.3 mg/0.3 mL IJ SOAJ injection Inject into the muscle.     erythromycin ophthalmic ointment SMARTSIG:In Eye(s)     esomeprazole (NEXIUM) 40 MG capsule TAKE 1 CAPSULE BY MOUTH TWICE DAILY BEFORE A MEAL 180 capsule 3   fluorouracil (EFUDEX) 5 % cream Apply topically twice daily for 4 weeks.     fluticasone (FLONASE) 50 MCG/ACT nasal spray Use 1 spray(s) in each nostril once daily 16 g 11   Fluticasone-Umeclidin-Vilant (TRELEGY ELLIPTA) 200-62.5-25 MCG/ACT AEPB INHALE 1 PUFF INTO THE LUNGS DAILY 60 each 1   gabapentin (NEURONTIN) 300 MG capsule TAKE 4 CAPSULES BY MOUTH IN THE MORNING AND 2 CAPSULES AT NOON AND 2 CAPSULES AT BEDTIME 240 capsule 0   hydrOXYzine (VISTARIL) 50 MG capsule Take 1 capsule (50 mg total) by mouth at bedtime. 90 capsule 3   levothyroxine (EUTHYROX) 200 MCG tablet Take 1 tablet (200 mcg total) by mouth daily before breakfast, except take 2 tablets (400 mcg total) on Sunday and Wednesday. 40 tablet 6   lisinopril (ZESTRIL) 40 MG tablet Take 1 tablet (40 mg total) by mouth daily. 90 tablet 3   LUER LOCK SAFETY SYRINGES 22G X 1-1/2" 3 ML MISC USE FOR SELF ADMINISTER OF TESTOSTERONE 2CC ONCE EVERY 2 WEEKS     Multiple Vitamin (MULTI-VITAMIN) tablet Take 1 tablet by mouth daily.     QUVIVIQ 25 MG TABS TAKE 1 TABLET BY MOUTH AT BEDTIME 30 tablet 2   rosuvastatin (CRESTOR) 40 MG tablet Take 1 tablet by mouth once daily 90 tablet 3   tamsulosin (FLOMAX) 0.4 MG CAPS capsule Take 1 capsule by mouth once daily 30 capsule 11   testosterone cypionate (DEPOTESTOSTERONE CYPIONATE) 200 MG/ML injection Inject 0.6 mLs (120 mg total) into the muscle  once a week. 10 mL 0   traZODone (DESYREL) 150 MG tablet Take 150 mg by mouth at bedtime. 75 mg Sunday - Thursday. Then Friday, Saturday 150 mg     traZODone (DESYREL) 150 MG tablet Take 1/2 tablet (75 mg total) by mouth at bedtime, except take 1 tablet (150 mg total) on Friday and Saturday. 90 tablet 3   TRELEGY ELLIPTA 200-62.5-25 MCG/ACT AEPB Inhale 1 puff into the lungs daily. 60 each 5   VENTOLIN HFA 108 (90 Base) MCG/ACT inhaler INHALE 2 PUFFS BY MOUTH EVERY 6 HOURS AS NEEDED FOR SHORTNESS OF BREATH 18 g 1  verapamil (VERELAN PM) 180 MG 24 hr capsule Take 1 capsule by mouth at bedtime 90 capsule 3   azelastine (ASTELIN) 0.1 % nasal spray Place 1 spray into both nostrils daily. Use in each nostril as directed 30 mL 5   No facility-administered medications prior to visit.   Allergies  Allergen Reactions   Ciprofloxacin Other (See Comments)    Spontaneous rupture of tendon   Earnestine Leys Officinalis] Anaphylaxis and Hives   Fluocinolone Other (See Comments)    "spontaneous rupture of tendons" per pt dr.     Dione Housekeeper:   Today's Vitals   09/22/22 1553 09/22/22 1602  BP: 134/72 136/76  Pulse: (!) 108   Temp: 98.2 F (36.8 C)   TempSrc: Temporal   SpO2: 96%   Weight: 215 lb 3.2 oz (97.6 kg)   Height: 6' (1.829 m)    Body mass index is 29.19 kg/m.   General: Well developed, well nourished. No acute distress. HEENT: Normocephalic, non-traumatic. External ears normal. Both TMs are bulging and do not move   with Valsalva. No redness. Normal LR.  Psych: Alert and oriented. Normal mood and affect.  Health Maintenance Due  Topic Date Due   Medicare Annual Wellness (AWV)  05/22/2021   Assessment & Plan:   1. Recurrent acute serous otitis media of both ears No sign of a perforation at this point. Recommend doing valsalva multiple times a day. Continue to manage chronic rhinitis issues with Flonase 1 spray each nostril daily and as needed azelastine.  Return in about 3 months  (around 12/22/2022) for Reassessment.   Loyola Mast, MD

## 2022-09-23 MED ORDER — BELSOMRA 10 MG PO TABS: 10.0000 mg | ORAL_TABLET | Freq: Every evening | ORAL | 1 refills | Status: DC | PRN

## 2022-09-29 ENCOUNTER — Other Ambulatory Visit: Payer: Self-pay | Admitting: Family Medicine

## 2022-09-29 DIAGNOSIS — G63 Polyneuropathy in diseases classified elsewhere: Secondary | ICD-10-CM

## 2022-10-12 ENCOUNTER — Ambulatory Visit: Payer: Medicare Other | Admitting: Family Medicine

## 2022-10-14 ENCOUNTER — Encounter: Payer: Self-pay | Admitting: Internal Medicine

## 2022-10-14 ENCOUNTER — Ambulatory Visit: Payer: Medicare Other | Attending: Internal Medicine | Admitting: Internal Medicine

## 2022-10-14 ENCOUNTER — Ambulatory Visit (INDEPENDENT_AMBULATORY_CARE_PROVIDER_SITE_OTHER): Payer: Medicare Other

## 2022-10-14 VITALS — BP 136/88 | HR 102 | Ht 72.0 in | Wt 212.0 lb

## 2022-10-14 DIAGNOSIS — R002 Palpitations: Secondary | ICD-10-CM

## 2022-10-14 DIAGNOSIS — R0989 Other specified symptoms and signs involving the circulatory and respiratory systems: Secondary | ICD-10-CM | POA: Diagnosis not present

## 2022-10-14 DIAGNOSIS — I10 Essential (primary) hypertension: Secondary | ICD-10-CM

## 2022-10-14 NOTE — Progress Notes (Unsigned)
ZIO XT serial # [id number] from office inventory applied to patient on Monday , 10/18/2022.

## 2022-10-14 NOTE — Progress Notes (Signed)
Cardiology Office Note   Date:  10/14/2022   ID:  [name], DOB Dec 14, 1975, MRN [id number]  PCP:  Haydee Salter, MD  Cardiologist:   Dorris Carnes, MD   Patient referred for lightheaded spell      History of Present Illness: Troy [name] is a 47 y.o. male with a history of remote hodgkins lymphom, thyroid cancer, astham, bipolar disorder, pulm firbrosis, HTN, Tob uses, Hx multifocal embolic strokes    Seen in ER in Oct 2023 for spell of lightheadednes  PT says he was pushing a cart in a store   Then he felt things "go away", says he flet that things were "far away" Started to panic   Says it was like his  prior stroke   IN a few min , back to normal    After this the pt says for several weeks he wasn't right   got light headed  felt weird   HR in 120s   (by watch)  Spells lasted 30 to 45 sec of weirdness;       The pt notes occsaional CP   Short lived   Not associated with particular activity  He also has chronic respiratory issues      Hx of Hodgkins, exercise induced asthma  Pt is a power lifter     Current Meds  Medication Sig   albuterol (PROVENTIL) (5 MG/ML) 0.5% nebulizer solution    ammonium lactate (LAC-HYDRIN) 12 % lotion Apply 1 application topically as needed for dry skin.   aspirin 325 MG tablet Take 1 tablet (325 mg total) by mouth daily.   baclofen (LIORESAL) 10 MG tablet Take 1 tablet by mouth three times daily as needed   clindamycin (CLEOCIN T) 1 % lotion Apply topically 2 (two) times daily.   EPINEPHrine 0.3 mg/0.3 mL IJ SOAJ injection Inject into the muscle.   erythromycin ophthalmic ointment SMARTSIG:In Eye(s)   esomeprazole (NEXIUM) 40 MG capsule TAKE 1 CAPSULE BY MOUTH TWICE DAILY BEFORE A MEAL   fluorouracil (EFUDEX) 5 % cream Apply topically twice daily for 4 weeks.   fluticasone (FLONASE) 50 MCG/ACT nasal spray Use 1 spray(s) in each nostril once daily   Fluticasone-Umeclidin-Vilant (TRELEGY ELLIPTA) 200-62.5-25 MCG/ACT AEPB INHALE 1 PUFF INTO THE  LUNGS DAILY   gabapentin (NEURONTIN) 300 MG capsule TAKE 4 CAPSULES BY MOUTH IN THE MORNING AND 2 CAPSULES AT NOON AND 2 CAPSULES AT BEDTIME   hydrOXYzine (VISTARIL) 50 MG capsule Take 1 capsule (50 mg total) by mouth at bedtime.   levothyroxine (EUTHYROX) 200 MCG tablet Take 1 tablet (200 mcg total) by mouth daily before breakfast, except take 2 tablets (400 mcg total) on Sunday and Wednesday.   lisinopril (ZESTRIL) 40 MG tablet Take 1 tablet (40 mg total) by mouth daily.   LUER LOCK SAFETY SYRINGES 22G X 1-1/2" 3 ML MISC USE FOR SELF ADMINISTER OF TESTOSTERONE 2CC ONCE EVERY 2 WEEKS   Multiple Vitamin (MULTI-VITAMIN) tablet Take 1 tablet by mouth daily.   rosuvastatin (CRESTOR) 40 MG tablet Take 1 tablet by mouth once daily   Suvorexant (BELSOMRA) 10 MG TABS Take 10 mg by mouth at bedtime as needed.   tamsulosin (FLOMAX) 0.4 MG CAPS capsule Take 1 capsule by mouth once daily   testosterone cypionate (DEPOTESTOSTERONE CYPIONATE) 200 MG/ML injection Inject 0.6 mLs (120 mg total) into the muscle once a week.   traZODone (DESYREL) 150 MG tablet Take 150 mg by mouth at bedtime. 75 mg Sunday - Thursday.  Then Friday, Saturday 150 mg   TRELEGY ELLIPTA 200-62.5-25 MCG/ACT AEPB Inhale 1 puff into the lungs daily.   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   Chronic fatigue    Drug abuse ([place])    Family history of breast cancer 05/19/2021   Family history of colon cancer in mother    GERD (gastroesophageal reflux disease)    +Barretts   Hematochezia    hemorrhoids   Hepatic steatosis    +hepatomegaly   History of CVA (cerebrovascular accident)     radiation-induced carotid damage/changes   History of hepatitis C    +treated   History of Hodgkin's lymphoma    age 40->mantle RT + splenectomy   History of splenectomy    Hyperlipidemia    Hypertension    Insomnia    Male hypogonadism    Moderate persistent asthma    Obesity, Class I, BMI 30-34.9    Papillary thyroid carcinoma ([place]) 2010   total thyroidectomy + neck dissection   Postsurgical hypothyroidism    Prediabetes    Pulmonary fibrosis ([place])    ?rad induced?    Past Surgical History:  Procedure Laterality Date   COLONOSCOPY  07/04/2019   done for hematochezia and FH col ca-mother: Mod int hem w/inflamm.  No adenomatous polyps.   INCISIONAL HERNIA REPAIR  06/2019   RUQ (prev lap append site)   LAPAROSCOPIC APPENDECTOMY N/A 03/25/2017   Procedure: APPENDECTOMY LAPAROSCOPIC; EXTENSIVE LYSIS OF ADHESIONS FROM PRIOR RADIATION AND SURGERY;  Surgeon: Johnathan Hausen, MD;  Location: [facility];  Service: General;  Laterality: N/A;   PARATHYROIDECTOMY     SPLENECTOMY     TEE WITHOUT CARDIOVERSION N/A 07/18/2015   Procedure: TRANSESOPHAGEAL ECHOCARDIOGRAM (TEE);  Surgeon: Thayer Headings, MD;  Location: [place];  Service: Cardiovascular;  Laterality: N/A;   THYROIDECTOMY       Social History:  The patient  reports that he quit smoking about 11 years ago. His smoking use included cigarettes. He has never used smokeless tobacco. He reports that he does not currently use alcohol. He reports current drug use. Drug: Marijuana.   Family History:  The patient's family history includes Breast cancer in his maternal aunt and mother; Colon cancer in his mother; Leukemia in an other family member; Multiple myeloma in his father; Other in his mother; Parkinsonism in his father; Stroke in his maternal grandmother.    ROS:  Please see the history of present illness. All other systems are reviewed and  Negative to the above problem except as noted.    PHYSICAL EXAM: VS:  BP 136/88    Pulse (!) 102   Ht 6' (1.829 m)   Wt 212 lb (96.2 kg)   SpO2 96%   BMI 28.75 kg/m   GEN: Well nourished, well developed, in no acute distress  HEENT: normal  Neck: no JVD, murmur L neck   Cardiac: RRR; no murmurs,  No LE edema  Respiratory:  clear to auscultation bilaterally GI: soft, nontender, nondistended, + BS  No hepatomegaly  MS: no deformity Moving all extremities   Skin: warm and dry,  no rash Neuro:  Strength and sensation are intact Psych: euthymic mood, full affect   EKG:  EKG is not ordered today.  Echo   Oct 2016     - Left ventricle: The cavity size was normal. Wall thickness was    increased in a pattern of moderate LVH. Systolic function was    mildly reduced. The estimated ejection fraction was in the range    of 45% to 50%. Wall motion was normal; there were no regional    wall motion abnormalities. Left ventricular diastolic function    parameters were normal.  - Aortic valve: There was mild to moderate regurgitation.  - Left atrium: The atrium was mildly dilated.   Lipid Panel    Component Value Date/Time   CHOL 121 05/05/2021 1137   TRIG 86.0 05/05/2021 1137   HDL 42.90 05/05/2021 1137   CHOLHDL 3 05/05/2021 1137   VLDL 17.2 05/05/2021 1137   LDLCALC 61 05/05/2021 1137      Wt Readings from Last 3 Encounters:  10/14/22 212 lb (96.2 kg)  09/22/22 215 lb 3.2 oz (97.6 kg)  09/15/22 214 lb (97.1 kg)      ASSESSMENT AND PLAN:  1  Spell  Not clear what this spell represeents   I am not convinced a TIA  will set up for an echo   Alos set up for a Zio patch to evaluate for rhythm problems   Encouraged pt to stay hydrated   2  HFmrEF  Hx of   will repeat echo     3  Tachycardia   Monitor ordered     4 HL   Will check lipomed, Lpa and Apo B  5  hx Hodgkins    Did get mantle irradiation.  Need to be vigilant fo possible fibrotic dz of coronary arteries      Current medicines are reviewed at length with the patient today.  The patient does not  have concerns regarding medicines.  Signed, Dorris Carnes, MD  10/14/2022 3:18 PM    [address], [place], [place]  [address] Phone: [phone]; Fax: [phone]

## 2022-10-14 NOTE — Patient Instructions (Signed)
Medication Instructions:   *If you need a refill on your cardiac medications before your next appointment, please call your pharmacy*   Lab Work: [place]  If you have labs (blood work) drawn today and your tests are completely normal, you will receive your results only by: [place] (if you have MyChart) OR A paper copy in the mail If you have any lab test that is abnormal or we need to change your treatment, we will call you to review the results.   Testing/Procedures: Your physician has requested that you have a carotid duplex. This test is an ultrasound of the carotid arteries in your neck. It looks at blood flow through these arteries that supply the brain with blood. Allow one hour for this exam. There are no restrictions or special instructions.  Your physician has requested that you have an echocardiogram. Echocardiography is a painless test that uses sound waves to create images of your heart. It provides your doctor with information about the size and shape of your heart and how well your heart's chambers and valves are working. This procedure takes approximately one hour. There are no restrictions for this procedure. Please do NOT wear cologne, perfume, aftershave, or lotions (deodorant is allowed). Please arrive 15 minutes prior to your appointment time.  ZIO XT- Long Term Monitor Instructions  Your physician has requested you wear a ZIO patch monitor for 14 days.  This is a single patch monitor. [employer] supplies one patch monitor per enrollment. Additional stickers are not available. Please do not apply patch if you will be having a Nuclear Stress Test,  Echocardiogram, Cardiac CT, MRI, or Chest Xray during the period you would be wearing the  monitor. The patch cannot be worn during these tests. You cannot remove and re-apply the  ZIO XT patch monitor.  Your ZIO patch monitor will be mailed 3 day USPS to your address on file. It may take 3-5 days  to receive  your monitor after you have been enrolled.  Once you have received your monitor, please review the enclosed instructions. Your monitor  has already been registered assigning a specific monitor serial # to you.  Billing and Patient Assistance Program Information  We have supplied [employer] with any of your insurance information on file for billing purposes. [employer] offers a sliding scale Patient Assistance Program for patients that do not have  insurance, or whose insurance does not completely cover the cost of the ZIO monitor.  You must apply for the Patient Assistance Program to qualify for this discounted rate.  To apply, please call [employer] at [phone], select option 4, select option 2, ask to apply for  Patient Assistance Program. Theodore Demark will ask your household income, and how many people  are in your household. They will quote your out-of-pocket cost based on that information.  [employer] will also be able to set up a 56-month interest-free payment plan if needed.  Applying the monitor   Shave hair from upper left chest.  Hold abrader disc by orange tab. Rub abrader in 40 strokes over the upper left chest as  indicated in your monitor instructions.  Clean area with 4 enclosed alcohol pads. Let dry.  Apply patch as indicated in monitor instructions. Patch will be placed under collarbone on left  side of chest with arrow pointing upward.  Rub patch adhesive wings for 2 minutes. Remove white label marked "1". Remove the white  label marked "2". Rub patch adhesive wings for 2  additional minutes.  While looking in a mirror, press and release button in center of patch. A small green light will  flash 3-4 times. This will be your only indicator that the monitor has been turned on.  Do not shower for the first 24 hours. You may shower after the first 24 hours.  Press the button if you feel a symptom. You will hear a small click. Record Date, Time and  Symptom in the Patient Logbook.  When  you are ready to remove the patch, follow instructions on the last 2 pages of Patient  Logbook. Stick patch monitor onto the last page of Patient Logbook.  Place Patient Logbook in the blue and white box. Use locking tab on box and tape box closed  securely. The blue and white box has prepaid postage on it. Please place it in the mailbox as  soon as possible. Your physician should have your test results approximately 7 days after the  monitor has been mailed back to [facility].  Call [place] at [phone] if you have questions regarding  your ZIO XT patch monitor. Call them immediately if you see an orange light blinking on your  monitor.  If your monitor falls off in less than 4 days, contact our Monitor department at [phone].  If your monitor becomes loose or falls off after 4 days call [employer] at [phone] for  suggestions on securing your monitor    Follow-Up: At [facility], you and your health needs are our priority.  As part of our continuing mission to provide you with exceptional heart care, we have created designated Provider Care Teams.  These Care Teams include your primary Cardiologist (physician) and Advanced Practice Providers (APPs -  Physician Assistants and Nurse Practitioners) who all work together to provide you with the care you need, when you need it.  We recommend signing up for the patient portal called "MyChart".  Sign up information is provided on this After Visit Summary.  MyChart is used to connect with patients for Virtual Visits (Telemedicine).  Patients are able to view lab/test results, encounter notes, upcoming appointments, etc.  Non-urgent messages can be sent to your provider as well.   To learn more about what you can do with MyChart, go to [email or web address].    Important Information About Sugar

## 2022-10-15 ENCOUNTER — Ambulatory Visit (HOSPITAL_COMMUNITY)
Admission: RE | Admit: 2022-10-15 | Discharge: 2022-10-15 | Disposition: A | Discharge status: Home / Self Care | Payer: 59 | Source: Ambulatory Visit | Attending: Cardiology | Admitting: Cardiology

## 2022-10-15 DIAGNOSIS — I10 Essential (primary) hypertension: Secondary | ICD-10-CM | POA: Diagnosis not present

## 2022-10-15 DIAGNOSIS — R0989 Other specified symptoms and signs involving the circulatory and respiratory systems: Secondary | ICD-10-CM | POA: Diagnosis not present

## 2022-10-15 DIAGNOSIS — R002 Palpitations: Secondary | ICD-10-CM

## 2022-10-15 LAB — NMR, LIPOPROFILE
Cholesterol, Total: 148 mg/dL (ref 100–199)
HDL Particle Number: 23.6 umol/L — ABNORMAL LOW (ref >=30.5)
HDL-C: 41 mg/dL (ref >39)
LDL Particle Number: 1014 nmol/L — ABNORMAL HIGH (ref <1000)
LDL Size: 21.3 nm (ref >20.5)
LDL-C (NIH Calc): 87 mg/dL (ref 0–99)
LP-IR Score: 29 (ref <=45)
Small LDL Particle Number: 308 nmol/L (ref <=527)
Triglycerides: 109 mg/dL (ref 0–149)

## 2022-10-15 LAB — LIPOPROTEIN A (LPA): Lipoprotein (a): 46.6 nmol/L (ref <75.0)

## 2022-10-15 LAB — APOLIPOPROTEIN B: Apolipoprotein B: 74 mg/dL (ref <90)

## 2022-10-18 ENCOUNTER — Ambulatory Visit: Payer: 59 | Attending: Cardiology

## 2022-10-18 DIAGNOSIS — R946 Abnormal results of thyroid function studies: Secondary | ICD-10-CM | POA: Diagnosis not present

## 2022-10-18 DIAGNOSIS — R002 Palpitations: Secondary | ICD-10-CM | POA: Diagnosis not present

## 2022-10-18 DIAGNOSIS — C73 Malignant neoplasm of thyroid gland: Secondary | ICD-10-CM | POA: Diagnosis not present

## 2022-10-18 DIAGNOSIS — I10 Essential (primary) hypertension: Secondary | ICD-10-CM | POA: Diagnosis not present

## 2022-10-18 DIAGNOSIS — R7989 Other specified abnormal findings of blood chemistry: Secondary | ICD-10-CM | POA: Diagnosis not present

## 2022-10-18 DIAGNOSIS — R0989 Other specified symptoms and signs involving the circulatory and respiratory systems: Secondary | ICD-10-CM | POA: Diagnosis not present

## 2022-10-18 DIAGNOSIS — R7303 Prediabetes: Secondary | ICD-10-CM | POA: Diagnosis not present

## 2022-10-18 DIAGNOSIS — R109 Unspecified abdominal pain: Secondary | ICD-10-CM | POA: Diagnosis not present

## 2022-10-18 DIAGNOSIS — Z923 Personal history of irradiation: Secondary | ICD-10-CM | POA: Diagnosis not present

## 2022-10-18 DIAGNOSIS — E890 Postprocedural hypothyroidism: Secondary | ICD-10-CM | POA: Diagnosis not present

## 2022-10-19 ENCOUNTER — Encounter: Payer: Self-pay | Admitting: Allergy & Immunology

## 2022-10-19 ENCOUNTER — Other Ambulatory Visit: Payer: Self-pay

## 2022-10-19 ENCOUNTER — Ambulatory Visit (INDEPENDENT_AMBULATORY_CARE_PROVIDER_SITE_OTHER): Payer: Medicare Other | Admitting: Allergy & Immunology

## 2022-10-19 ENCOUNTER — Telehealth: Payer: Self-pay

## 2022-10-19 VITALS — BP 122/70 | HR 100 | Temp 98.5°F | Resp 20 | Ht 72.0 in | Wt 215.0 lb

## 2022-10-19 DIAGNOSIS — K148 Other diseases of tongue: Secondary | ICD-10-CM | POA: Diagnosis not present

## 2022-10-19 DIAGNOSIS — J302 Other seasonal allergic rhinitis: Secondary | ICD-10-CM

## 2022-10-19 DIAGNOSIS — R109 Unspecified abdominal pain: Secondary | ICD-10-CM

## 2022-10-19 DIAGNOSIS — J3089 Other allergic rhinitis: Secondary | ICD-10-CM | POA: Diagnosis not present

## 2022-10-19 DIAGNOSIS — J4550 Severe persistent asthma, uncomplicated: Secondary | ICD-10-CM | POA: Diagnosis not present

## 2022-10-19 DIAGNOSIS — T782XXD Anaphylactic shock, unspecified, subsequent encounter: Secondary | ICD-10-CM

## 2022-10-19 NOTE — Telephone Encounter (Signed)
-----   Message from Fay Records, MD sent at 10/16/2022 11:33 PM EST ----- LIPids  LDL is 87   With plaquing of  aorta it is recomm to be 70 or less ContinueCrestor   I would also recomm adding Zetia 10 mm  Check lipomed and liver panel in 8 wks

## 2022-10-19 NOTE — Telephone Encounter (Signed)
I spoke with the pt re: his recent labwork and he says he would rather take time to read about Zetia prior to taking it... I sent the information  to his My Cart and he will let us know if he would like for [place] to send it in.

## 2022-10-19 NOTE — Patient Instructions (Addendum)
1. Moderate persistent asthma, uncomplicated - Lung testing looks AWESOME today.   - We are not going to make any changes at this time. - It seems that everything is under food control.  - Daily controller medication(s): Trelegy 200/62.5/25 one puff once daily  - Prior to physical activity: Ventolin 2 puffs 10-15 minutes before physical activity. - Rescue medications: Ventolin 4 puffs every 4-6 hours as needed - Asthma control goals:  * Full participation in all desired activities (may need albuterol before activity) * Albuterol use two time or less a week on average (not counting use with activity) * Cough interfering with sleep two time or less a month * Oral steroids no more than once a year * No hospitalizations  2. Chronic rhinitis (dust mites, cat, dog, grasses, molds, trees, and ragweed) - Continue with the Claritin (loratadine) '10mg'$  daily.  - Continue with Allegra (fexofenadine) '10mg'$  daily.  - Continue with Allercort one spray per nostril daily. - Continue with Astelin one spray per nostril daily. - CPT codes provided for allergy shots.  - This is a curative process and can help with the nasal congestion.   3. 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  - No further episodes. - We will continue to monitor.    4. Right-sided abdominal ultrasound - We ordered a liver/gallbladder ultrasound at [facility]. - This is the best place to start.  - We will call you with the results of the testing.   5. Oral lesions - Referral placed for ENT. - Hopefully they will call you in one week or so.   6. Return in about 6 months (around 04/19/2023).    Please inform us of any Emergency Department visits, hospitalizations, or changes in symptoms. Call us before going to the ED for breathing or allergy symptoms since we might be able to fit you in for a sick visit. Feel free to contact us anytime with any questions, problems, or concerns.  It was a pleasure to  see you again today!   Websites that have reliable patient information: 1. American Academy of Asthma, Allergy, and Immunology: www.aaaai.org 2. Food Allergy Research and Education (FARE): foodallergy.org 3. Mothers of Asthmatics: http://www.asthmacommunitynetwork.org 4. American College of Allergy, Asthma, and Immunology: www.acaai.org   COVID-19 Vaccine Information can be found at: ShippingScam.co.uk For questions related to vaccine distribution or appointments, please email [email or web address] or call [phone].   We realize that you might be concerned about having an allergic reaction to the COVID19 vaccines. To help with that concern, WE ARE OFFERING THE COVID19 VACCINES IN OUR OFFICE! Ask the front desk for dates!     "Like" [place] on Facebook and Instagram for our latest updates!      A healthy democracy works best when [employer] participate! Make sure you are registered to vote! If you have moved or changed any of your contact information, you will need to get this updated before voting!  In some cases, you MAY be able to register to vote online: [email or web address]     Allergy Shots   Allergies are the result of a chain reaction that starts in the immune system. Your immune system controls how your body defends itself. For instance, if you have an allergy to pollen, your immune system identifies pollen as an invader or allergen. Your immune system overreacts by producing antibodies called Immunoglobulin E (IgE). These antibodies travel to cells that release chemicals, causing an allergic reaction.  The concept behind allergy immunotherapy, whether it  is received in the form of shots or tablets, is that the immune system can be desensitized to specific allergens that trigger allergy symptoms. Although it requires time and patience, the payback can be long-term relief.  How Do Allergy  Shots Work?  Allergy shots work much like a vaccine. Your body responds to injected amounts of a particular allergen given in increasing doses, eventually developing a resistance and tolerance to it. Allergy shots can lead to decreased, minimal or no allergy symptoms.  There generally are two phases: build-up and maintenance. Build-up often ranges from three to six months and involves receiving injections with increasing amounts of the allergens. The shots are typically given once or twice a week, though more rapid build-up schedules are sometimes used.  The maintenance phase begins when the most effective dose is reached. This dose is different for each person, depending on how allergic you are and your response to the build-up injections. Once the maintenance dose is reached, there are longer periods between injections, typically two to four weeks.  Occasionally doctors give cortisone-type shots that can temporarily reduce allergy symptoms. These types of shots are different and should not be confused with allergy immunotherapy shots.  Who Can Be Treated with Allergy Shots?  Allergy shots may be a good treatment approach for people with allergic rhinitis (hay fever), allergic asthma, conjunctivitis (eye allergy) or stinging insect allergy.   Before deciding to begin allergy shots, you should consider:   The length of allergy season and the severity of your symptoms  Whether medications and/or changes to your environment can control your symptoms  Your desire to avoid long-term medication use  Time: allergy immunotherapy requires a major time commitment  Cost: may vary depending on your insurance coverage  Allergy shots for children age 27 and older are effective and often well tolerated. They might prevent the onset of new allergen sensitivities or the progression to asthma.  Allergy shots are not started on patients who are pregnant but can be continued on patients who become pregnant  while receiving them. In some patients with other medical conditions or who take certain common medications, allergy shots may be of risk. It is important to mention other medications you talk to your allergist.   When Will I Feel Better?  Some may experience decreased allergy symptoms during the build-up phase. For others, it may take as long as 12 months on the maintenance dose. If there is no improvement after a year of maintenance, your allergist will discuss other treatment options with you.  If you aren't responding to allergy shots, it may be because there is not enough dose of the allergen in your vaccine or there are missing allergens that were not identified during your allergy testing. Other reasons could be that there are high levels of the allergen in your environment or major exposure to non-allergic triggers like tobacco smoke.  What Is the Length of Treatment?  Once the maintenance dose is reached, allergy shots are generally continued for three to five years. The decision to stop should be discussed with your allergist at that time. Some people may experience a permanent reduction of allergy symptoms. Others may relapse and a longer course of allergy shots can be considered.  What Are the Possible Reactions?  The two types of adverse reactions that can occur with allergy shots are local and systemic. Common local reactions include very mild redness and swelling at the injection site, which can happen immediately or several hours after. A  systemic reaction, which is less common, affects the entire body or a particular body system. They are usually mild and typically respond quickly to medications. Signs include increased allergy symptoms such as sneezing, a stuffy nose or hives.  Rarely, a serious systemic reaction called anaphylaxis can develop. Symptoms include swelling in the throat, wheezing, a feeling of tightness in the chest, nausea or dizziness. Most serious systemic reactions  develop within 30 minutes of allergy shots. This is why it is strongly recommended you wait in your doctor's office for 30 minutes after your injections. Your allergist is trained to watch for reactions, and his or her staff is trained and equipped with the proper medications to identify and treat them.  Who Should Administer Allergy Shots?  The preferred location for receiving shots is your prescribing allergist's office. Injections can sometimes be given at another facility where the physician and staff are trained to recognize and treat reactions, and have received instructions by your prescribing allergist.

## 2022-10-19 NOTE — Progress Notes (Signed)
FOLLOW UP  Date of Service/Encounter:  10/19/22   Assessment:   Moderate persistent asthma - much better on Trelegy and Nucala    Elevated AEC in the past (600 in March 2020)   Perennial and seasonal allergic rhinitis (dust mites, cat, dog, grasses, molds, trees, and ragweed)  Right-sided abdominal pain - getting an abdominal ultrasound today  Bilateral tongue lesions - sending to ENT   Complicated past medical history, including Hodgkin's lymphoma at age 72 and subsequent malignancies since that time  Plan/Recommendations:   1. Moderate persistent asthma, uncomplicated - Lung testing looks AWESOME today.   - We are not going to make any changes at this time. - It seems that everything is under food control.  - Daily controller medication(s): Trelegy 200/62.5/25 one puff once daily  - Prior to physical activity: Ventolin 2 puffs 10-15 minutes before physical activity. - Rescue medications: Ventolin 4 puffs every 4-6 hours as needed - Asthma control goals:  * Full participation in all desired activities (may need albuterol before activity) * Albuterol use two time or less a week on average (not counting use with activity) * Cough interfering with sleep two time or less a month * Oral steroids no more than once a year * No hospitalizations  2. Chronic rhinitis (dust mites, cat, dog, grasses, molds, trees, and ragweed) - Continue with the Claritin (loratadine) '10mg'$  daily.  - Continue with Allegra (fexofenadine) '10mg'$  daily.  - Continue with Allercort one spray per nostril daily. - Continue with Astelin one spray per nostril daily. - CPT codes provided for allergy shots.  - This is a curative process and can help with the nasal congestion.   3. 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  - No further episodes. - We will continue to monitor.    4. Right-sided abdominal ultrasound - We ordered a liver/gallbladder ultrasound at [facility]. -  This is the best place to start.  - We will call you with the results of the testing.   5. Oral lesions - Referral placed for ENT. - Hopefully they will call you in one week or so.   6. Return in about 6 months (around 04/19/2023).    Subjective:   Troy [name] is a 47 y.o. 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/02/2021   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   Erectile dysfunction 02/02/2021   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   Pulmonary fibrosis ([place]) 02/02/2021   Peripheral neuropathy 02/02/2021   History of hepatitis C 02/02/2021   Back pain 02/02/2021   Hyperlipidemia 02/02/2021   Moderate persistent asthma, uncomplicated 32/20/2542   History of Hodgkin's lymphoma 08/13/2020   Chronic rhinitis 08/13/2020   Not currently working due to disabled status 08/13/2020   Pain in joint of left knee 07/25/2020   History of thyroid cancer 06/09/2020   Arthralgia of left elbow 05/01/2020  Autonomic dysfunction 04/23/2020   Iron deficiency anemia due to chronic blood loss 01/18/2020   Microalbuminuria 01/18/2020   Chronic insomnia 01/17/2020   Large liver 01/17/2020   Prediabetes 01/11/2019   Daytime somnolence 07/22/2017   History of basal cell carcinoma (BCC) 11/30/2016   GERD (gastroesophageal reflux disease) 08/25/2016   Essential hypertension 08/25/2016   Substance induced mood disorder ([place])    History of stroke      History obtained from: chart review and patient.  Troy [name] is a 47 y.o. male presenting for a follow up visit.  We last saw him in July 2023.  At that time, his lung testing looked perfect.  We did get a chest x-ray to rule out pneumonia due to his history of several weeks of sputum production.  We continue with Trelegy 200 mcg 1 puff daily.  For his rhinitis, we continue with Zyrtec as well as Flonase and Astelin.  We did make sure his EpiPen was up-to-date due to his history of exercise-induced anaphylaxis.  His chest x-ray was normal.  Since last visit, he has largely done well.  He was sick around Thanksgiving with a viral upper respiratory infection that spread around the home.  He contracted a chalazion around that same time and was placed on first a course of Keflex and then on doxycycline.  This is since improved.  He has had some residual hoarseness.  Asthma/Respiratory Symptom History: He remains on Trelegy 1 puff once daily.  This seems to be working well.  He was on Nucala a while ago, but has not been on this in over 1 year.  A lot of his major breathing problems improved once he stopped riding his bike.  He is still doing a lot of weight lifting and can do his stationary bike without a problem.  He has not been on prednisone for any of his breathing issues.  He has not been to the emergency room for his breathing.  He reports that he has a constant pain on the right side of his abdomen.  This has been ongoing for a number of weeks and has been getting worse.  This seems to remain fairly stable throughout the day.  He has not had any imaging of his abdomen since 2018 when he was diagnosed with appendicitis.  He has not talked to his PCP about this.  Allergic Rhinitis Symptom History: Allergies are not under great control. He does use nose sprays but not consistently.  He reports that he has nose swelling when he eats. This is every meal. He is on Allegra and Claritin. He has a generic  Nasacort spray to use as needed.  He has not been on antibiotics for sinus stuff since last visit, aside from the 2 courses of antibiotics for his chalazion.  He still has a lot of congestion which his family complains about.  His dad has mentioned it on a number of occasions.  He has never been on allergy shots and does not seem to interested when we talk about the buildup of weekly injections for several months.  He is open to getting some information on it and checking on pricing.  He does not see the oncologist at this time.  He has a history of 8 different types of cancers, but does not require follow-up for any of them evidently. He has an elevated hematocrit which is thought to be secondary to his testosterone. He has been on this for 3 years and he has been  much more functional on this dose.   He does report that he has a bilateral tongue lesions.  These were noticed by his dentist and apparently he has a paper referral for ENT evaluation.  He has not looked for any ENT and is interested in having [place] refer him to an ENT.  Otherwise, there have been no changes to his past medical history, surgical history, family history, or social history.    Review of Systems  Constitutional: Negative.  Negative for chills, fever, malaise/fatigue and weight loss.  HENT:  Positive for congestion and sinus pain. Negative for ear discharge and ear pain.   Eyes:  Negative for pain, discharge and redness.  Respiratory:  Negative for cough, sputum production, shortness of breath and wheezing.   Cardiovascular: Negative.  Negative for chest pain and palpitations.  Gastrointestinal:  Negative for abdominal pain, constipation, diarrhea, heartburn, nausea and vomiting.  Skin: Negative.  Negative for itching and rash.  Neurological:  Negative for dizziness and headaches.  Endo/Heme/Allergies:  Positive for environmental allergies. Does not bruise/bleed easily.  All other systems reviewed and are negative.       Objective:   Blood pressure 122/70, pulse 100, temperature 98.5 F (36.9 C), resp. rate 20, height 6' (1.829 m), weight 215 lb (97.5 kg), SpO2 97 %. Body mass index is 29.16 kg/m.    Physical Exam Vitals reviewed.  Constitutional:      Appearance: He is well-developed.     Comments: Pleasant very talkative male.  HENT:     Head: Normocephalic and atraumatic.     Right Ear: Tympanic membrane, ear canal and external ear normal.     Left Ear: Tympanic membrane, ear canal and external ear normal.     Nose: No nasal deformity, septal deviation, mucosal edema or rhinorrhea.     Right Turbinates: Enlarged, swollen and pale.     Left Turbinates: Enlarged, swollen and pale.     Right Sinus: No maxillary sinus tenderness or frontal sinus tenderness.     Left Sinus: No maxillary sinus tenderness or frontal sinus tenderness.     Comments: No nasal polyps.    Mouth/Throat:     Lips: Pink.     Mouth: Mucous membranes are moist. Mucous membranes are not pale and not dry. Oral lesions present.     Pharynx: Uvula midline.     Comments: He does have some white linear lesions on the bilateral sides of his tongue. Eyes:     General: Lids are normal. No allergic shiner.       Right eye: No discharge.        Left eye: No discharge.     Conjunctiva/sclera: Conjunctivae normal.     Right eye: Right conjunctiva is not injected. No chemosis.    Left eye: Left conjunctiva is not injected. No chemosis.    Pupils: Pupils are equal, round, and reactive to light.  Cardiovascular:     Rate and Rhythm: Normal rate and regular rhythm.     Heart sounds: Normal heart sounds.  Pulmonary:     Effort: Pulmonary effort is normal. No tachypnea, accessory muscle usage or respiratory distress.     Breath sounds: Rhonchi present. No wheezing or rales.     Comments: Coarse upper airway sounds throughout.   Chest:     Chest wall: No tenderness.  Lymphadenopathy:     Cervical: No cervical adenopathy.   Skin:    Coloration: Skin is not pale.     Findings: No  abrasion, erythema, petechiae or rash. Rash is not papular, urticarial or vesicular.  Neurological:     Mental Status: He is alert.  Psychiatric:        Behavior: Behavior is cooperative.      Diagnostic studies:    Spirometry: results normal (FEV1: 3.38 L, FVC: 4.84 L, FEV1/FVC: 70).    Spirometry consistent with normal pattern.  This is much better than that obtained at the last visit.  Allergy Studies: none       Salvatore Marvel, MD  [facility]

## 2022-10-20 ENCOUNTER — Other Ambulatory Visit: Payer: Self-pay | Admitting: Allergy & Immunology

## 2022-10-27 DIAGNOSIS — L816 Other disorders of diminished melanin formation: Secondary | ICD-10-CM | POA: Diagnosis not present

## 2022-10-27 DIAGNOSIS — Z1283 Encounter for screening for malignant neoplasm of skin: Secondary | ICD-10-CM | POA: Diagnosis not present

## 2022-10-27 DIAGNOSIS — L821 Other seborrheic keratosis: Secondary | ICD-10-CM | POA: Diagnosis not present

## 2022-10-27 DIAGNOSIS — Z85828 Personal history of other malignant neoplasm of skin: Secondary | ICD-10-CM | POA: Diagnosis not present

## 2022-10-27 DIAGNOSIS — D1801 Hemangioma of skin and subcutaneous tissue: Secondary | ICD-10-CM | POA: Diagnosis not present

## 2022-10-27 DIAGNOSIS — L814 Other melanin hyperpigmentation: Secondary | ICD-10-CM | POA: Diagnosis not present

## 2022-11-01 ENCOUNTER — Telehealth: Payer: Self-pay | Admitting: Allergy & Immunology

## 2022-11-01 NOTE — Telephone Encounter (Signed)
Naif has been referred to:  [facility] [address] [place], [place] [address] [phone]  I have faxed the referral and all corresponding notes to [facility].  They will contact patient to schedule.  I have reached out to patient via Mychart and patient is aware.

## 2022-11-02 ENCOUNTER — Encounter: Payer: Self-pay | Admitting: Family Medicine

## 2022-11-02 DIAGNOSIS — D751 Secondary polycythemia: Secondary | ICD-10-CM

## 2022-11-02 DIAGNOSIS — R002 Palpitations: Secondary | ICD-10-CM | POA: Diagnosis not present

## 2022-11-02 DIAGNOSIS — R0989 Other specified symptoms and signs involving the circulatory and respiratory systems: Secondary | ICD-10-CM | POA: Diagnosis not present

## 2022-11-09 ENCOUNTER — Ambulatory Visit
Admission: RE | Admit: 2022-11-09 | Discharge: 2022-11-09 | Disposition: A | Discharge status: Home / Self Care | Payer: 59 | Source: Ambulatory Visit | Attending: Allergy & Immunology | Admitting: Allergy & Immunology

## 2022-11-09 DIAGNOSIS — R109 Unspecified abdominal pain: Secondary | ICD-10-CM | POA: Diagnosis not present

## 2022-11-10 ENCOUNTER — Ambulatory Visit (HOSPITAL_COMMUNITY): Payer: 59 | Attending: Cardiology

## 2022-11-10 DIAGNOSIS — R0989 Other specified symptoms and signs involving the circulatory and respiratory systems: Secondary | ICD-10-CM | POA: Diagnosis not present

## 2022-11-10 DIAGNOSIS — I10 Essential (primary) hypertension: Secondary | ICD-10-CM

## 2022-11-10 DIAGNOSIS — R002 Palpitations: Secondary | ICD-10-CM | POA: Diagnosis not present

## 2022-11-11 LAB — ECHOCARDIOGRAM COMPLETE
Area-P 1/2: 2.47 cm2
S' Lateral: 4.1 cm

## 2022-11-12 NOTE — Progress Notes (Signed)
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 male is here because of his erythrocytosis.  He was referred by his primary care physician Dr. Gena Fray.  He presents to the clinic by himself.  He has pretty complex medical history.  He was diagnosed with Hodgkin lymphoma when he was 77, and underwent mantle field radiation therapy, and subsequently developed skin cancer, thyroid cancer, hypothyroidism etc.  He also had a stroke in 2016, which he contributes to hypertension, he was probably taking testosterone replacement at that time also, and his hemoglobin was 17 on admission.  Subsequently his hemoglobin came down to normal, he actually had episode of anemia in 2018 secondary to iron deficiency.  He has been more consistently taking testosterone replacement under the care of his endocrinologist in the past 2 years, and he noticed that his hemoglobin has been trending up.  His recent hemoglobin was 17.2 with hematocrit 50%, and repeated H/H on same day was 19/56%.  He was referred to [place] for therapeutic phlebotomy.  He is not able to donate blood due to his previous history of cancers.  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   Chronic fatigue    Drug abuse ([place])    Family history of breast cancer 05/19/2021   Family  history of colon cancer in mother    GERD (gastroesophageal reflux disease)    +Barretts   Hematochezia    hemorrhoids   Hepatic steatosis    +hepatomegaly   History of CVA (cerebrovascular accident)    radiation-induced carotid damage/changes   History of hepatitis C    +treated   History of Hodgkin's lymphoma    age 40->mantle RT + splenectomy   History of splenectomy    Hyperlipidemia    Hypertension    Insomnia    Male hypogonadism    Moderate persistent asthma    Obesity, Class I, BMI 30-34.9    Papillary thyroid carcinoma ([place]) 2010   total thyroidectomy + neck dissection   Postsurgical hypothyroidism    Prediabetes    Pulmonary fibrosis ([place])    ?rad induced?    SURGICAL HISTORY: Past Surgical History:  Procedure Laterality Date   COLONOSCOPY  07/04/2019   done for hematochezia and FH col ca-mother: Mod int hem w/inflamm.  No adenomatous polyps.   INCISIONAL HERNIA REPAIR  06/2019   RUQ (prev lap append site)   LAPAROSCOPIC APPENDECTOMY N/A 03/25/2017   Procedure: APPENDECTOMY LAPAROSCOPIC; EXTENSIVE LYSIS OF ADHESIONS FROM PRIOR RADIATION AND SURGERY;  Surgeon: Johnathan Hausen, MD;  Location: [facility];  Service: General;  Laterality: N/A;   PARATHYROIDECTOMY     SPLENECTOMY     TEE WITHOUT CARDIOVERSION N/A 07/18/2015   Procedure: TRANSESOPHAGEAL ECHOCARDIOGRAM (TEE);  Surgeon: Thayer Headings, MD;  Location: [facility];  Service: Cardiovascular;  Laterality: N/A;   THYROIDECTOMY      SOCIAL HISTORY: Social History   Socioeconomic History  Marital status: Single    Spouse name: Not on file   Number of children: 0   Years of education: Not on file   Highest education level: Not on file  Occupational History   Not on file  Tobacco Use   Smoking status: Former    Types: Cigarettes    Quit date: 2013    Years since quitting: 11.0   Smokeless tobacco: Never  Vaping Use   Vaping Use: Never used  Substance and Sexual Activity   Alcohol use: Not  Currently    Comment: occasionally   Drug use: Yes    Types: Marijuana   Sexual activity: Yes  Other Topics Concern   Not on file  Social History Narrative   Are you right handed or left handed? Right   Are you currently employed ? No   What is your current occupation? None   Do you live at home alone? yes   Who lives with you? no   What type of home do you live in: 1 story or 2 story? Third floor apratment      Social Determinants of Health   Financial Resource Strain: Not on file  Food Insecurity: Not on file  Transportation Needs: Not on file  Physical Activity: Not on file  Stress: Not on file  Social Connections: Not on file  Intimate Partner Violence: Not on file    FAMILY HISTORY: Family History  Problem Relation Age of Onset   Breast cancer Mother        dx early 35s   Colon cancer Mother        dx early 28s   Other Mother        NTHL1 homozygous   Parkinsonism Father    Multiple myeloma Father        dx 47s   Breast cancer Maternal Aunt    Stroke Maternal Grandmother    Leukemia Other        dx <20; two paternal first cousins once removed    ALLERGIES:  is allergic to ciprofloxacin, sage [salvia officinalis], and fluocinolone.  MEDICATIONS:  Current Outpatient Medications  Medication Sig Dispense Refill   albuterol (PROVENTIL) (5 MG/ML) 0.5% nebulizer solution      ammonium lactate (LAC-HYDRIN) 12 % lotion Apply 1 application topically as needed for dry skin.     aspirin 325 MG tablet Take 1 tablet (325 mg total) by mouth daily. 30 tablet 0   azelastine (ASTELIN) 0.1 % nasal spray Place 1 spray into both nostrils daily. Use in each nostril as directed 30 mL 5   baclofen (LIORESAL) 10 MG tablet Take 1 tablet by mouth three times daily as needed 30 tablet 5   clindamycin (CLEOCIN T) 1 % lotion Apply topically 2 (two) times daily.     EPINEPHrine 0.3 mg/0.3 mL IJ SOAJ injection Inject into the muscle.     erythromycin ophthalmic ointment SMARTSIG:In  Eye(s)     esomeprazole (NEXIUM) 40 MG capsule TAKE 1 CAPSULE BY MOUTH TWICE DAILY BEFORE A MEAL 180 capsule 3   fluorouracil (EFUDEX) 5 % cream Apply topically twice daily for 4 weeks.     fluticasone (FLONASE) 50 MCG/ACT nasal spray Use 1 spray(s) in each nostril once daily 16 g 11   Fluticasone-Umeclidin-Vilant (TRELEGY ELLIPTA) 200-62.5-25 MCG/ACT AEPB INHALE 1 PUFF INTO THE LUNGS DAILY 60 each 1   gabapentin (NEURONTIN) 300 MG capsule TAKE 4 CAPSULES BY MOUTH IN THE MORNING AND 2 CAPSULES AT NOON AND 2  CAPSULES AT BEDTIME 240 capsule 5   hydrOXYzine (VISTARIL) 50 MG capsule Take 1 capsule (50 mg total) by mouth at bedtime. 90 capsule 3   levothyroxine (EUTHYROX) 200 MCG tablet Take 1 tablet (200 mcg total) by mouth daily before breakfast, except take 2 tablets (400 mcg total) on Sunday and Wednesday. 40 tablet 6   lisinopril (ZESTRIL) 40 MG tablet Take 1 tablet (40 mg total) by mouth daily. 90 tablet 3   LUER LOCK SAFETY SYRINGES 22G X 1-1/2" 3 ML MISC USE FOR SELF ADMINISTER OF TESTOSTERONE 2CC ONCE EVERY 2 WEEKS     Multiple Vitamin (MULTI-VITAMIN) tablet Take 1 tablet by mouth daily.     QUVIVIQ 25 MG TABS TAKE 1 TABLET BY MOUTH AT BEDTIME 30 tablet 2   rosuvastatin (CRESTOR) 40 MG tablet Take 1 tablet by mouth once daily 90 tablet 3   Suvorexant (BELSOMRA) 10 MG TABS Take 10 mg by mouth at bedtime as needed. 30 tablet 1   tamsulosin (FLOMAX) 0.4 MG CAPS capsule Take 1 capsule by mouth once daily 30 capsule 11   testosterone cypionate (DEPOTESTOSTERONE CYPIONATE) 200 MG/ML injection Inject 0.6 mLs (120 mg total) into the muscle once a week. 10 mL 0   traZODone (DESYREL) 150 MG tablet Take 150 mg by mouth at bedtime. 75 mg Sunday - Thursday. Then Friday, Saturday 150 mg     traZODone (DESYREL) 150 MG tablet Take 1/2 tablet (75 mg total) by mouth at bedtime, except take 1 tablet (150 mg total) on Friday and Saturday. 90 tablet 3   TRELEGY ELLIPTA 200-62.5-25 MCG/ACT AEPB Inhale 1 puff into  the lungs daily. 60 each 5   VENTOLIN HFA 108 (90 Base) MCG/ACT inhaler INHALE 2 PUFFS BY MOUTH EVERY 6 HOURS AS NEEDED FOR SHORTNESS OF BREATH 18 g 1   verapamil (VERELAN PM) 180 MG 24 hr capsule Take 1 capsule by mouth at bedtime 90 capsule 3   No current facility-administered medications for this visit.    REVIEW OF SYSTEMS:   Constitutional: Denies fevers, chills or abnormal night sweats Eyes: Denies blurriness of vision, double vision or watery eyes Ears, nose, mouth, throat, and face: Denies mucositis or sore throat Respiratory: Denies cough, dyspnea or wheezes Cardiovascular: Denies palpitation, chest discomfort or lower extremity swelling Gastrointestinal:  Denies nausea, heartburn or change in bowel habits Skin: Denies abnormal skin rashes Lymphatics: Denies new lymphadenopathy or easy bruising Neurological:Denies numbness, tingling or new weaknesses Behavioral/Psych: Mood is stable, no new changes  All other systems were reviewed with the patient and are negative.  PHYSICAL EXAMINATION: ECOG PERFORMANCE STATUS: 1 - Symptomatic but completely ambulatory  Vitals:   11/13/22 1039  BP: 136/87  Pulse: (!) 106  Resp: 17  Temp: 98.6 F (37 C)  SpO2: 100%   Filed Weights   11/13/22 1039  Weight: 217 lb 12.8 oz (98.8 kg)    GENERAL:alert, no distress and comfortable SKIN: skin color, texture, turgor are normal, no rashes or significant lesions EYES: normal, conjunctiva are pink and non-injected, sclera clear OROPHARYNX:no exudate, no erythema and lips, buccal mucosa, and tongue normal  NECK: supple, thyroid normal size, non-tender, without nodularity LYMPH:  no palpable lymphadenopathy in the cervical, axillary or inguinal LUNGS: clear to auscultation and percussion with normal breathing effort HEART: regular rate & rhythm and no murmurs and no lower extremity edema ABDOMEN:abdomen soft, non-tender and normal bowel sounds Musculoskeletal:no cyanosis of digits and no  clubbing  PSYCH: alert & oriented x 3 with fluent speech  NEURO: no focal motor/sensory deficits  LABORATORY DATA:  I have reviewed the data as listed    Latest Ref Rng & Units 08/05/2022    5:41 PM 08/05/2022    5:21 PM 08/12/2020    2:36 PM  CBC  WBC 4.0 - 10.5 K/uL  11.0  10.3   Hemoglobin 13.0 - 17.0 g/dL 19.0  17.2  15.0   Hematocrit 39.0 - 52.0 % 56.0  50.0  43.1   Platelets 150 - 400 K/uL  230  255     '@cmpl'$ @  RADIOGRAPHIC STUDIES: I have personally reviewed the radiological images as listed and agreed with the findings in the report. ECHOCARDIOGRAM COMPLETE  Result Date: 11/11/2022    ECHOCARDIOGRAM REPORT   Patient Name:   AYDRIEN FROMAN Spadoni Date of Exam: 11/10/2022 Medical Rec #:  [id number]       Height:       72.0 in Accession #:    [id number]      Weight:       215.0 lb Date of Birth:  01-31-1976        BSA:          2.197 m Patient Age:    47 years        BP:           136/88 mmHg Patient Gender: M               HR:           108 bpm. Exam Location:  [facility] Procedure: 2D Echo, Cardiac Doppler, Color Doppler, 3D Echo and Strain Analysis Indications:    R00.2 Palpitations  History:        Patient has prior history of Echocardiogram examinations, most                 recent 07/17/2015. Palpitations, Stroke; Risk                 Factors:Hypertension, Dyslipidemia and Former Smoker.  Sonographer:    Coralyn Helling RDCS Referring Phys: [id number] PAULA V ROSS IMPRESSIONS  1. Left ventricular ejection fraction by 3D volume is 45 %. The left ventricle has mildly decreased function. The left ventricle demonstrates global hypokinesis. The left ventricular internal cavity size was mildly dilated. There is mild left ventricular hypertrophy. Left ventricular diastolic parameters are consistent with Grade I diastolic dysfunction (impaired relaxation). The average left ventricular global longitudinal strain is -13.6 %. The global longitudinal strain is abnormal.  2. Right ventricular systolic function  is normal. The right ventricular size is normal. There is normal pulmonary artery systolic pressure. The estimated right ventricular systolic pressure is 98.9 mmHg.  3. The mitral valve is normal in structure. No evidence of mitral valve regurgitation. No evidence of mitral stenosis.  4. The aortic valve is normal in structure. Aortic valve regurgitation is mild. No aortic stenosis is present.  5. The inferior vena cava is normal in size with greater than 50% respiratory variability, suggesting right atrial pressure of 3 mmHg. FINDINGS  Left Ventricle: Left ventricular ejection fraction by 3D volume is 45 %. The left ventricle has mildly decreased function. The left ventricle demonstrates global hypokinesis. The average left ventricular global longitudinal strain is -13.6 %. The global  longitudinal strain is abnormal. The left ventricular internal cavity size was mildly dilated. There is mild left ventricular hypertrophy. Left ventricular diastolic parameters are consistent with Grade I diastolic dysfunction (impaired relaxation). Right Ventricle: The right ventricular size is normal. No increase  in right ventricular wall thickness. Right ventricular systolic function is normal. There is normal pulmonary artery systolic pressure. The tricuspid regurgitant velocity is 2.50 m/s, and  with an assumed right atrial pressure of 3 mmHg, the estimated right ventricular systolic pressure is 62.9 mmHg. Left Atrium: Left atrial size was normal in size. Right Atrium: Right atrial size was normal in size. Pericardium: There is no evidence of pericardial effusion. Mitral Valve: The mitral valve is normal in structure. No evidence of mitral valve regurgitation. No evidence of mitral valve stenosis. Tricuspid Valve: The tricuspid valve is normal in structure. Tricuspid valve regurgitation is mild . No evidence of tricuspid stenosis. Aortic Valve: The aortic valve is normal in structure. Aortic valve regurgitation is mild. No  aortic stenosis is present. Pulmonic Valve: The pulmonic valve was normal in structure. Pulmonic valve regurgitation is not visualized. No evidence of pulmonic stenosis. Aorta: The aortic root is normal in size and structure. Venous: The inferior vena cava is normal in size with greater than 50% respiratory variability, suggesting right atrial pressure of 3 mmHg. IAS/Shunts: No atrial level shunt detected by color flow Doppler.  LEFT VENTRICLE PLAX 2D LVIDd:         5.40 cm         Diastology LVIDs:         4.10 cm         LV e' medial:    9.14 cm/s LV PW:         1.30 cm         LV E/e' medial:  10.0 LV IVS:        1.00 cm         LV e' lateral:   14.80 cm/s LVOT diam:     2.40 cm         LV E/e' lateral: 6.2 LVOT Area:     4.52 cm                                2D                                Longitudinal                                Strain                                2D Strain GLS  -13.6 %                                Avg:                                 3D Volume EF                                LV 3D EF:    Left  ventricul                                             ar                                             ejection                                             fraction                                             by 3D                                             volume is                                             45 %.                                 3D Volume EF:                                3D EF:        45 %                                LV EDV:       238 ml                                LV ESV:       130 ml                                LV SV:        108 ml RIGHT VENTRICLE             IVC RV S prime:     14.60 cm/s  IVC diam: 1.50 cm TAPSE (M-mode): 1.4 cm RVSP:           28.0 mmHg LEFT ATRIUM             Index        RIGHT ATRIUM           Index LA diam:        3.80 cm 1.73 cm/m   RA Pressure: 3.00 mmHg LA Vol (A2C):   48.0 ml 21.85 ml/m  RA  Area:     14.80 cm LA Vol (A4C):   60.1  ml 27.35 ml/m  RA Volume:   35.50 ml  16.16 ml/m LA Biplane Vol: 55.1 ml 25.08 ml/m   AORTA Ao Root diam: 3.00 cm Ao Asc diam:  3.20 cm MITRAL VALVE                TRICUSPID VALVE MV Area (PHT): 2.47 cm     TR Peak grad:   25.0 mmHg MV Decel Time: 307 msec     TR Vmax:        250.00 cm/s MV E velocity: 91.20 cm/s   Estimated RAP:  3.00 mmHg MV A velocity: 128.00 cm/s  RVSP:           28.0 mmHg MV E/A ratio:  0.71                             SHUNTS                             Systemic Diam: 2.40 cm Candee Furbish MD Electronically signed by Candee Furbish MD Signature Date/Time: 11/11/2022/7:12:41 AM    Final    US Abdomen Limited RUQ (LIVER/GB)  Result Date: 11/09/2022 CLINICAL DATA:  Right upper quadrant abdomen pain for 1 year. EXAM: ULTRASOUND ABDOMEN LIMITED RIGHT UPPER QUADRANT COMPARISON:  CT abdomen and pelvis March 25, 2017, ultrasound of abdomen November 28, 2017 FINDINGS: Gallbladder: No gallstones or wall thickening visualized. No sonographic Murphy sign noted by sonographer. Common bile duct: Diameter: 3.3 mm Liver: No focal lesion identified. Within normal limits in parenchymal echogenicity. Portal vein is patent on color Doppler imaging with normal direction of blood flow towards the liver. Other: None. IMPRESSION: Normal right upper quadrant ultrasound. Electronically Signed   By: Abelardo Diesel M.D.   On: 11/09/2022 09:05   LONG TERM MONITOR (3-14 DAYS)  Result Date: 11/04/2022 Patch Wear Time:  9 days and 4 hours (2024-01-08T15:23:01-499 to 2024-01-17T20:17:28-0500) Patient had a min HR of 59 bpm, max HR of 172 bpm, and avg HR of 95 bpm. Predominant underlying rhythm was Sinus Rhythm. Isolated SVEs were rare (<1.0%), SVE Couplets were rare (<1.0%), and SVE Triplets were rare (<1.0%). Isolated VEs were rare (<1.0%), and no VE Couplets or VE Triplets were present. No triggered events     VAS US CAROTID  Result Date: 10/15/2022 Carotid Arterial Duplex  Study Patient Name:  TYRIK STETZER Welling  Date of Exam:   10/15/2022 Medical Rec #: [id number]        Accession #:    [id number] Date of Birth: 1976-07-09         Patient Gender: M Patient Age:   40 years Exam Location:  [facility] Procedure:      VAS US CAROTID Referring Phys: Nevin Bloodgood ROSS --------------------------------------------------------------------------------  Indications:       Left bruit and patient denies any recent cerebrovascular                    symptoms but did have an episode of swelling and discomfort                    behind his right eye that lasted about 6 hours in October                    2023. Risk Factors:      Hypertension, past history of smoking, prior CVA. 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 Comparison Study:  NA Performing Technologist: Leavy Cella RDCS  Examination Guidelines: A complete evaluation includes B-mode imaging, spectral Doppler, color Doppler, and power Doppler as needed of all accessible portions of each vessel. Bilateral testing is considered an integral part of a complete examination. Limited examinations for reoccurring indications may be performed as noted.  Right Carotid Findings: +----------+--------+--------+--------+------------------+--------+           PSV cm/sEDV cm/sStenosisPlaque DescriptionComments +----------+--------+--------+--------+------------------+--------+ CCA Prox  112     20                                         +----------+--------+--------+--------+------------------+--------+ CCA Mid   116     25                                         +----------+--------+--------+--------+------------------+--------+ CCA Distal85      18                                          +----------+--------+--------+--------+------------------+--------+ ICA Prox  107     15                                         +----------+--------+--------+--------+------------------+--------+ ICA Mid   66      18                                         +----------+--------+--------+--------+------------------+--------+ ICA Distal73      18                                         +----------+--------+--------+--------+------------------+--------+ ECA       223     28                                         +----------+--------+--------+--------+------------------+--------+ +----------+--------+-------+----------------+-------------------+           PSV cm/sEDV cmsDescribe        Arm Pressure (mmHG) +----------+--------+-------+----------------+-------------------+ [id number]            Multiphasic, [id number]                 +----------+--------+-------+----------------+-------------------+ +---------+--------+--+--------+--+---------+ VertebralPSV cm/s59EDV cm/s14Antegrade +---------+--------+--+--------+--+---------+  Left Carotid Findings: +----------+--------+--------+--------+------------------+--------+           PSV cm/sEDV cm/sStenosisPlaque DescriptionComments +----------+--------+--------+--------+------------------+--------+ CCA Prox  170  22                                         +----------+--------+--------+--------+------------------+--------+ CCA Mid   100     20                                         +----------+--------+--------+--------+------------------+--------+ CCA Distal93      20                                         +----------+--------+--------+--------+------------------+--------+ ICA Prox  53      9                                          +----------+--------+--------+--------+------------------+--------+ ICA Mid   75      23                                          +----------+--------+--------+--------+------------------+--------+ ICA Distal81      25                                         +----------+--------+--------+--------+------------------+--------+ ECA       127     18                                         +----------+--------+--------+--------+------------------+--------+ +----------+--------+--------+----------------+-------------------+           PSV cm/sEDV cm/sDescribe        Arm Pressure (mmHG) +----------+--------+--------+----------------+-------------------+ [id number]             Multiphasic, [id number]                 +----------+--------+--------+----------------+-------------------+ +---------+--------+--+--------+--+---------+ VertebralPSV cm/s51EDV cm/s14Antegrade +---------+--------+--+--------+--+---------+   Summary: Right Carotid: The extracranial vessels were near-normal with only minimal wall                thickening or plaque. Left Carotid: The extracranial vessels were near-normal with only minimal wall               thickening or plaque. Vertebrals:  Bilateral vertebral arteries demonstrate antegrade flow. Subclavians: Normal flow hemodynamics were seen in bilateral subclavian              arteries. *See table(s) above for measurements and observations.  Electronically signed by Ida Rogue MD on 10/15/2022 at 9:32:48 PM.    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  He has been taking testosterone replacement consistently in the past 2 years.  This is likely related to testosterone replacement.  I encouraged him to check his testosterone level and use minimal dose testosterone under his guidance of his  endocrinologist. -We discussed other secondary polycythemia, including COPD, sleep apnea, smoking.  He does have  interstitial lung disease due to previous radiation, does not smoke.  He states that he does not have sleep apnea. -I would like to rule out polycythemia vera, due to his previous history of stroke.  I will check JAK2 mutation and erythropoietin level -We discussed process email related to risk of thrombosis, including stroke and heart attack.  If he has secondary polycythemia, his risk of thrombosis is relatively low, he can continue phlebotomy to keep hematocrit less than 50.  If he does have PV, I recommend phlebotomy or Hydrea to keep hematocrit less than 45%. -Will give IV fluids with phlebotomy.  2.  History of Hodgkin lymphoma, status post mantle field radiation at age of 51 3.  History of thyroid cancer and skin cancer 4.  Hypotestosterone, on replacement testosterone. 5.  History of CVA 6.  Asthma and allergy   Plan -Lab and first phlebotomy next week -Phone visit in 2 to 3 weeks   Orders Placed This Encounter  Procedures   JAK2 (INCLUDING V617F AND EXON 12), MPL,& CALR W/RFL MPN PANEL (NGS)    Standing Status:   Future    Standing Expiration Date:   11/13/2023   CBC with Differential/Platelet    Standing Status:   Standing    Number of Occurrences:   50    Standing Expiration Date:   11/14/2023   Erythropoietin    Standing Status:   Future    Standing Expiration Date:   11/14/2023   Ferritin    Standing Status:   Standing    Number of Occurrences:   20    Standing Expiration Date:   11/14/2023    All questions were answered. The patient knows to call the clinic with any problems, questions or concerns. I spent 30 minutes counseling the patient face to face. The total time spent in the appointment was 35 minutes and more than 50% was on counseling.     Truitt Merle, MD 11/13/2022 11:36 AM

## 2022-11-13 ENCOUNTER — Encounter: Payer: Self-pay | Admitting: Allergy & Immunology

## 2022-11-13 ENCOUNTER — Encounter: Payer: Self-pay | Admitting: Hematology

## 2022-11-13 ENCOUNTER — Inpatient Hospital Stay: Payer: 59 | Attending: Hematology | Admitting: Hematology

## 2022-11-13 VITALS — BP 136/87 | HR 106 | Temp 98.6°F | Resp 17 | Ht 72.0 in | Wt 217.8 lb

## 2022-11-13 DIAGNOSIS — D509 Iron deficiency anemia, unspecified: Secondary | ICD-10-CM | POA: Diagnosis not present

## 2022-11-13 DIAGNOSIS — Z8571 Personal history of Hodgkin lymphoma: Secondary | ICD-10-CM | POA: Insufficient documentation

## 2022-11-13 DIAGNOSIS — D751 Secondary polycythemia: Secondary | ICD-10-CM | POA: Insufficient documentation

## 2022-11-13 DIAGNOSIS — Z08 Encounter for follow-up examination after completed treatment for malignant neoplasm: Secondary | ICD-10-CM | POA: Diagnosis not present

## 2022-11-13 DIAGNOSIS — Z09 Encounter for follow-up examination after completed treatment for conditions other than malignant neoplasm: Secondary | ICD-10-CM | POA: Insufficient documentation

## 2022-11-13 DIAGNOSIS — Z85828 Personal history of other malignant neoplasm of skin: Secondary | ICD-10-CM | POA: Diagnosis not present

## 2022-11-13 DIAGNOSIS — Z85850 Personal history of malignant neoplasm of thyroid: Secondary | ICD-10-CM | POA: Diagnosis not present

## 2022-11-13 DIAGNOSIS — Z79890 Hormone replacement therapy (postmenopausal): Secondary | ICD-10-CM | POA: Diagnosis not present

## 2022-11-13 DIAGNOSIS — Z8673 Personal history of transient ischemic attack (TIA), and cerebral infarction without residual deficits: Secondary | ICD-10-CM | POA: Diagnosis not present

## 2022-11-15 ENCOUNTER — Other Ambulatory Visit: Payer: Self-pay | Admitting: Hematology

## 2022-11-15 ENCOUNTER — Telehealth: Payer: Self-pay

## 2022-11-15 NOTE — Telephone Encounter (Signed)
I called the pt re: and gave him his Echo results and he quickly blew up at me for several minutes with insulting remarks such as.. "you are just a nurse and I do not think that a "nurse" is capable of calling and changing his meds without knowing that he has a history of problems with beta blockers and that he advised [place] that he will NEVER take them again and we should know that and have more "respect" for him.   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   He says that he is sure that Dr Harrington Challenger does not know how to handle pts that are young and active and he is sure we do not see anyone under 37.   He says he does not have anything against nurses but refuses to take any orders from one... he never wants a nurse to call him again.  He does not want to see DR Harrington Challenger... he says he wants someone "young" that specializes in sports.   I was unable to talk with the pt every time I spoke he raised his voice further and cut me off... I ended the phone call by letting him know I will note his concerns and send to my supervisor who also is a nurse and see what we can do to put him with an MD that he prefers and he said, "okay make that happen" and hung up on me.

## 2022-11-15 NOTE — Telephone Encounter (Signed)
-----   Message from Fay Records, MD sent at 11/15/2022  8:22 AM EST ----- LVEF is approximately 45%    RV function is normal Unchanged from previous echo Given echo findings I think he would be better off if stopped verapamil 180     Instead I would recomm Toprol XL 50 mg     Plan for follow up in cinic a a few wks to review BP /HR

## 2022-11-16 ENCOUNTER — Inpatient Hospital Stay: Payer: 59

## 2022-11-16 ENCOUNTER — Other Ambulatory Visit: Payer: Self-pay

## 2022-11-16 DIAGNOSIS — D509 Iron deficiency anemia, unspecified: Secondary | ICD-10-CM | POA: Diagnosis not present

## 2022-11-16 DIAGNOSIS — Z8571 Personal history of Hodgkin lymphoma: Secondary | ICD-10-CM | POA: Diagnosis not present

## 2022-11-16 DIAGNOSIS — Z85850 Personal history of malignant neoplasm of thyroid: Secondary | ICD-10-CM | POA: Diagnosis not present

## 2022-11-16 DIAGNOSIS — Z85828 Personal history of other malignant neoplasm of skin: Secondary | ICD-10-CM | POA: Diagnosis not present

## 2022-11-16 DIAGNOSIS — Z8673 Personal history of transient ischemic attack (TIA), and cerebral infarction without residual deficits: Secondary | ICD-10-CM | POA: Diagnosis not present

## 2022-11-16 DIAGNOSIS — Z09 Encounter for follow-up examination after completed treatment for conditions other than malignant neoplasm: Secondary | ICD-10-CM | POA: Diagnosis not present

## 2022-11-16 DIAGNOSIS — D751 Secondary polycythemia: Secondary | ICD-10-CM

## 2022-11-16 DIAGNOSIS — Z08 Encounter for follow-up examination after completed treatment for malignant neoplasm: Secondary | ICD-10-CM | POA: Diagnosis not present

## 2022-11-16 LAB — CBC WITH DIFFERENTIAL/PLATELET
Abs Immature Granulocytes: 0.04 10*3/uL (ref 0.00–0.07)
Basophils Absolute: 0.1 10*3/uL (ref 0.0–0.1)
Basophils Relative: 1 %
Eosinophils Absolute: 1 10*3/uL — ABNORMAL HIGH (ref 0.0–0.5)
Eosinophils Relative: 8 %
HCT: 49.6 % (ref 39.0–52.0)
Hemoglobin: 17.5 g/dL — ABNORMAL HIGH (ref 13.0–17.0)
Immature Granulocytes: 0 %
Lymphocytes Relative: 19 %
Lymphs Abs: 2.2 10*3/uL (ref 0.7–4.0)
MCH: 33.9 pg (ref 26.0–34.0)
MCHC: 35.3 g/dL (ref 30.0–36.0)
MCV: 96.1 fL (ref 80.0–100.0)
Monocytes Absolute: 1.3 10*3/uL — ABNORMAL HIGH (ref 0.1–1.0)
Monocytes Relative: 12 %
Neutro Abs: 7 10*3/uL (ref 1.7–7.7)
Neutrophils Relative %: 60 %
Platelets: 216 10*3/uL (ref 150–400)
RBC: 5.16 MIL/uL (ref 4.22–5.81)
RDW: 16.5 % — ABNORMAL HIGH (ref 11.5–15.5)
WBC: 11.7 10*3/uL — ABNORMAL HIGH (ref 4.0–10.5)
nRBC: 0 % (ref 0.0–0.2)

## 2022-11-16 LAB — FERRITIN: Ferritin: 73 ng/mL (ref 24–336)

## 2022-11-16 NOTE — Progress Notes (Signed)
Pt declined partial phlebotomy recommended by Dr. Burr Medico. MD notified.

## 2022-11-17 LAB — ERYTHROPOIETIN: Erythropoietin: 27.9 m[IU]/mL — ABNORMAL HIGH (ref 2.6–18.5)

## 2022-11-22 LAB — JAK2 (INCLUDING V617F AND EXON 12), MPL,& CALR W/RFL MPN PANEL (NGS)

## 2022-11-23 ENCOUNTER — Encounter: Payer: Self-pay | Admitting: Hematology

## 2022-11-26 ENCOUNTER — Inpatient Hospital Stay (HOSPITAL_BASED_OUTPATIENT_CLINIC_OR_DEPARTMENT_OTHER): Payer: 59 | Admitting: Hematology

## 2022-11-26 ENCOUNTER — Encounter: Payer: Self-pay | Admitting: Hematology

## 2022-11-26 DIAGNOSIS — D751 Secondary polycythemia: Secondary | ICD-10-CM | POA: Diagnosis not present

## 2022-11-26 NOTE — Progress Notes (Signed)
[facility]   [phone] [phone]   Clinic Follow up Note   Patient Care Team: Haydee Salter, MD as PCP - General (Family Medicine) Kathlene Cote, MD as Consulting Physician (Endocrinology) Carolan Clines, MD (Inactive) as Consulting Physician (Urology) Ernst Bowler Gwenith Daily, MD as Consulting Physician (Allergy and Immunology) Rhona Raider, Layne Benton, MD as Referring Physician (Dermatology) Truitt Merle, MD as Attending Physician (Hematology and Oncology)  Date of Service:  11/26/2022  I connected with Troy [name] on 11/26/2022 at 10:00 AM EST by telephone visit and verified that I am speaking with the correct person using two identifiers.  I discussed the limitations, risks, security and privacy concerns of performing an evaluation and management service by telephone and the availability of in person appointments. I also discussed with the patient that there may be a patient responsible charge related to this service. The patient expressed understanding and agreed to proceed.   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  He has been taking testosterone replacement consistently in the past 2 years.  This is likely related to testosterone replacement.  I encouraged him to check his testosterone level and use minimal dose testosterone under his guidance of his endocrinologist. -We discussed other secondary polycythemia, including COPD, sleep apnea, smoking.  He does have interstitial lung disease due to previous radiation, does not  smoke.  He states that he does not have sleep apnea. 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 -I will see him as needed.   PLAN: - Discuss JAK2  mutation and erythropoietin test results, PV ruled out  -I Don't recommend  a Bone Marrow biopsy or other additional work up  [employer] the pt to keep watch of Blood Counts     SUMMARY OF ONCOLOGIC HISTORY: Oncology History   No history exists.     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 He was last seen by me on 11/13/2021.    All other systems were reviewed with the patient and are negative.  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   Chronic fatigue    Drug abuse ([place])    Family history of breast cancer 05/19/2021   Family history of colon cancer in mother    GERD (gastroesophageal reflux disease)    +Barretts   Hematochezia    hemorrhoids   Hepatic steatosis    +hepatomegaly   History of CVA (cerebrovascular accident)    radiation-induced carotid damage/changes   History of hepatitis C    +treated   History of Hodgkin's lymphoma    age 26->mantle RT + splenectomy   History of splenectomy    Hyperlipidemia    Hypertension    Insomnia    Male hypogonadism    Moderate persistent  asthma    Obesity, Class I, BMI 30-34.9    Papillary thyroid carcinoma ([place]) 2010   total thyroidectomy + neck dissection   Postsurgical hypothyroidism    Prediabetes    Pulmonary fibrosis ([place])    ?rad induced?    SURGICAL HISTORY: Past Surgical History:  Procedure Laterality Date   COLONOSCOPY  07/04/2019   done for hematochezia and FH col ca-mother: Mod int hem w/inflamm.  No adenomatous polyps.   INCISIONAL  HERNIA REPAIR  06/2019   RUQ (prev lap append site)   LAPAROSCOPIC APPENDECTOMY N/A 03/25/2017   Procedure: APPENDECTOMY LAPAROSCOPIC; EXTENSIVE LYSIS OF ADHESIONS FROM PRIOR RADIATION AND SURGERY;  Surgeon: Johnathan Hausen, MD;  Location: [facility];  Service: General;  Laterality: N/A;   PARATHYROIDECTOMY     SPLENECTOMY     TEE WITHOUT CARDIOVERSION N/A 07/18/2015   Procedure: TRANSESOPHAGEAL ECHOCARDIOGRAM (TEE);  Surgeon: Thayer Headings, MD;  Location: [facility] ENDOSCOPY;  Service: Cardiovascular;  Laterality: N/A;   THYROIDECTOMY      I have reviewed the social history and family history with the patient and they are unchanged from previous note.  ALLERGIES:  is allergic to ciprofloxacin, sage [salvia officinalis], and fluocinolone.  MEDICATIONS:  Current Outpatient Medications  Medication Sig Dispense Refill   albuterol (PROVENTIL) (5 MG/ML) 0.5% nebulizer solution      ammonium lactate (LAC-HYDRIN) 12 % lotion Apply 1 application topically as needed for dry skin.     aspirin 325 MG tablet Take 1 tablet (325 mg total) by mouth daily. 30 tablet 0   azelastine (ASTELIN) 0.1 % nasal spray Place 1 spray into both nostrils daily. Use in each nostril as directed 30 mL 5   baclofen (LIORESAL) 10 MG tablet Take 1 tablet by mouth three times daily as needed 30 tablet 5   clindamycin (CLEOCIN T) 1 % lotion Apply topically 2 (two) times daily.     EPINEPHrine 0.3 mg/0.3 mL IJ SOAJ injection Inject into the muscle.     erythromycin ophthalmic ointment SMARTSIG:In Eye(s)     esomeprazole (NEXIUM) 40 MG capsule TAKE 1 CAPSULE BY MOUTH TWICE DAILY BEFORE A MEAL 180 capsule 3   fluorouracil (EFUDEX) 5 % cream Apply topically twice daily for 4 weeks.     fluticasone (FLONASE) 50 MCG/ACT nasal spray Use 1 spray(s) in each nostril once daily 16 g 11   Fluticasone-Umeclidin-Vilant (TRELEGY ELLIPTA) 200-62.5-25 MCG/ACT AEPB INHALE 1 PUFF INTO THE LUNGS DAILY 60 each 1   gabapentin (NEURONTIN) 300 MG capsule  TAKE 4 CAPSULES BY MOUTH IN THE MORNING AND 2 CAPSULES AT NOON AND 2 CAPSULES AT BEDTIME 240 capsule 5   hydrOXYzine (VISTARIL) 50 MG capsule Take 1 capsule (50 mg total) by mouth at bedtime. 90 capsule 3   levothyroxine (EUTHYROX) 200 MCG tablet Take 1 tablet (200 mcg total) by mouth daily before breakfast, except take 2 tablets (400 mcg total) on Sunday and Wednesday. 40 tablet 6   lisinopril (ZESTRIL) 40 MG tablet Take 1 tablet (40 mg total) by mouth daily. 90 tablet 3   LUER LOCK SAFETY SYRINGES 22G X 1-1/2" 3 ML MISC USE FOR SELF ADMINISTER OF TESTOSTERONE 2CC ONCE EVERY 2 WEEKS     Multiple Vitamin (MULTI-VITAMIN) tablet Take 1 tablet by mouth daily.     QUVIVIQ 25 MG TABS TAKE 1 TABLET BY MOUTH AT BEDTIME 30 tablet 2   rosuvastatin (CRESTOR) 40 MG tablet Take 1 tablet by mouth once daily 90 tablet 3   Suvorexant (BELSOMRA) 10 MG  TABS Take 10 mg by mouth at bedtime as needed. 30 tablet 1   tamsulosin (FLOMAX) 0.4 MG CAPS capsule Take 1 capsule by mouth once daily 30 capsule 11   testosterone cypionate (DEPOTESTOSTERONE CYPIONATE) 200 MG/ML injection Inject 0.6 mLs (120 mg total) into the muscle once a week. 10 mL 0   traZODone (DESYREL) 150 MG tablet Take 150 mg by mouth at bedtime. 75 mg Sunday - Thursday. Then Friday, Saturday 150 mg     traZODone (DESYREL) 150 MG tablet Take 1/2 tablet (75 mg total) by mouth at bedtime, except take 1 tablet (150 mg total) on Friday and Saturday. 90 tablet 3   TRELEGY ELLIPTA 200-62.5-25 MCG/ACT AEPB Inhale 1 puff into the lungs daily. 60 each 5   VENTOLIN HFA 108 (90 Base) MCG/ACT inhaler INHALE 2 PUFFS BY MOUTH EVERY 6 HOURS AS NEEDED FOR SHORTNESS OF BREATH 18 g 1   verapamil (VERELAN PM) 180 MG 24 hr capsule Take 1 capsule by mouth at bedtime 90 capsule 3   No current facility-administered medications for this visit.    PHYSICAL EXAMINATION: ECOG PERFORMANCE STATUS: 0 - Asymptomatic  There were no vitals filed for this visit. Wt Readings from Last  3 Encounters:  11/13/22 217 lb 12.8 oz (98.8 kg)  10/19/22 215 lb (97.5 kg)  10/14/22 212 lb (96.2 kg)     No vitals taken today, Exam not performed today  LABORATORY DATA:  I have reviewed the data as listed    Latest Ref Rng & Units 11/16/2022   11:44 AM 08/05/2022    5:41 PM 08/05/2022    5:21 PM  CBC  WBC 4.0 - 10.5 K/uL 11.7   11.0   Hemoglobin 13.0 - 17.0 g/dL 17.5  19.0  17.2   Hematocrit 39.0 - 52.0 % 49.6  56.0  50.0   Platelets 150 - 400 K/uL 216   230         Latest Ref Rng & Units 08/05/2022    5:41 PM 08/05/2022    5:21 PM 12/19/2018   11:52 AM  CMP  Glucose 70 - 99 mg/dL 91  93  132   BUN 6 - 20 mg/dL 32  33  16   Creatinine 0.61 - 1.24 mg/dL 1.30  1.23  1.04   Sodium 135 - 145 mmol/L 138  136  135   Potassium 3.5 - 5.1 mmol/L 4.2  4.5  3.7   Chloride 98 - 111 mmol/L 107  104  105   CO2 22 - 32 mmol/L  19  23   Calcium 8.9 - 10.3 mg/dL  9.7  9.1   Total Protein 6.5 - 8.1 g/dL  7.7    Total Bilirubin 0.3 - 1.2 mg/dL  0.3    Alkaline Phos 38 - 126 U/L  73    AST 15 - 41 U/L  62    ALT 0 - 44 U/L  80        RADIOGRAPHIC STUDIES: I have personally reviewed the radiological images as listed and agreed with the findings in the report. No results found.    No orders of the defined types were placed in this encounter.  All questions were answered. The patient knows to call the clinic with any problems, questions or concerns. No barriers to learning was detected. The total time spent in the appointment was 11 minutes.     Truitt Merle, MD 11/26/2022   Felicity Coyer am acting as scribe for Truitt Merle,  MD.   I have reviewed the above documentation for accuracy and completeness, and I agree with the above.

## 2022-11-27 ENCOUNTER — Other Ambulatory Visit: Payer: Self-pay | Admitting: Family Medicine

## 2022-11-27 DIAGNOSIS — F5104 Psychophysiologic insomnia: Secondary | ICD-10-CM

## 2022-12-02 ENCOUNTER — Telehealth: Payer: 59 | Admitting: Hematology

## 2022-12-10 ENCOUNTER — Telehealth: Payer: Self-pay | Admitting: *Deleted

## 2022-12-10 NOTE — Telephone Encounter (Signed)
Patient would like to transition care from Dr. Harrington Challenger to Dr. Gasper Sells.  Once I receive the OK from both parties I will transition his follow up visit to [facility] to be with Dr. Gasper Sells.

## 2022-12-17 ENCOUNTER — Telehealth: Payer: Self-pay | Admitting: Internal Medicine

## 2022-12-17 DIAGNOSIS — K1370 Unspecified lesions of oral mucosa: Secondary | ICD-10-CM | POA: Diagnosis not present

## 2022-12-17 NOTE — Telephone Encounter (Signed)
Patient wants call back directly from [facility].

## 2022-12-17 NOTE — Telephone Encounter (Signed)
LVM for patient to return call to office.

## 2022-12-17 NOTE — Telephone Encounter (Signed)
Spoke to patient and let him know Dr. Harrington Challenger has agreed to transfer and we are waiting on Dr. Gasper Sells to approve and will let him know.  He was appreciative call.

## 2022-12-19 ENCOUNTER — Other Ambulatory Visit: Payer: Self-pay | Admitting: Family Medicine

## 2022-12-19 DIAGNOSIS — I10 Essential (primary) hypertension: Secondary | ICD-10-CM

## 2022-12-30 ENCOUNTER — Encounter: Payer: Self-pay | Admitting: Internal Medicine

## 2022-12-30 ENCOUNTER — Ambulatory Visit: Payer: 59 | Attending: Internal Medicine | Admitting: Internal Medicine

## 2022-12-30 ENCOUNTER — Encounter: Payer: Self-pay | Admitting: Family Medicine

## 2022-12-30 VITALS — BP 132/70 | HR 110 | Ht 72.0 in | Wt 215.8 lb

## 2022-12-30 DIAGNOSIS — I700 Atherosclerosis of aorta: Secondary | ICD-10-CM | POA: Diagnosis not present

## 2022-12-30 DIAGNOSIS — R6889 Other general symptoms and signs: Secondary | ICD-10-CM | POA: Diagnosis not present

## 2022-12-30 DIAGNOSIS — I5020 Unspecified systolic (congestive) heart failure: Secondary | ICD-10-CM | POA: Diagnosis not present

## 2022-12-30 DIAGNOSIS — I471 Supraventricular tachycardia, unspecified: Secondary | ICD-10-CM | POA: Diagnosis not present

## 2022-12-30 MED ORDER — BISOPROLOL FUMARATE 5 MG PO TABS: 2.5000 mg | ORAL_TABLET | Freq: Every day | ORAL | 3 refills | Status: DC

## 2022-12-30 MED ORDER — EZETIMIBE 10 MG PO TABS: 10.0000 mg | ORAL_TABLET | Freq: Every day | ORAL | 3 refills | Status: DC

## 2022-12-30 MED ORDER — FUROSEMIDE 20 MG PO TABS: 20.0000 mg | ORAL_TABLET | ORAL | 3 refills | Status: DC

## 2022-12-30 NOTE — Progress Notes (Signed)
Cardiology Office Note:    Date:  12/30/2022   ID:  KJ COENEN, DOB [birth year], MRN [id number]  PCP:  Haydee Salter, MD   [facility] Providers Cardiologist:  None     Referring MD: Haydee Salter, MD   CC:  Transition to new cardiologist  History of Present Illness:    Troy [name] is a 47 y.o. male with a hx of Hodgkin's Lymphoma s/p high dose radiation, with prior pulmonary fibrosis and asthma with hx of Tobacco abuse, current marijuana , asthma, and HTN.  Prior history of embolic strokes. Patient is a [employer] farm manager, former marathon runner. Asthma was worse on TRT.  Has had issues with DOE. 2024: Seen by Dr. 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  Patient notes that he is finding a new normal.   He reviews his care pre Q000111Q (embolic stroke) after TRT, and up to his f/u with Dr. Harrington Challenger. He has had multiple episodes of spells concerning for TIA ED work up negative. No AF on monitor. No carotid stenosis. No subclavian stenosis despite blood pressure differential from R arm to left arm  No chest pain or pressure.   Is not able to sleep and wakes up at night.  No weight gain or leg swelling.   Notes palpitations; has prior history of SVT.  Has hx of POTS.   Ambulatory blood pressure 165.    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   Chronic fatigue    Drug abuse ([place])    Family history of breast cancer 05/19/2021   Family history of colon cancer in mother    GERD (gastroesophageal reflux disease)    +Barretts   Hematochezia    hemorrhoids   Hepatic steatosis    +hepatomegaly   History of CVA (cerebrovascular accident)    radiation-induced carotid damage/changes   History of hepatitis C    +treated   History of Hodgkin's lymphoma    age 34->mantle RT + splenectomy   History of splenectomy    Hyperlipidemia    Hypertension    Insomnia    Male  hypogonadism    Moderate persistent asthma    Obesity, Class I, BMI 30-34.9    Papillary thyroid carcinoma ([place]) 2010   total thyroidectomy + neck dissection   Postsurgical hypothyroidism    Prediabetes    Pulmonary fibrosis ([place])    ?rad induced?    Past Surgical History:  Procedure Laterality Date   COLONOSCOPY  07/04/2019   done for hematochezia and FH col ca-mother: Mod int hem w/inflamm.  No adenomatous polyps.   INCISIONAL HERNIA REPAIR  06/2019   RUQ (prev lap append site)   LAPAROSCOPIC APPENDECTOMY N/A 03/25/2017   Procedure: APPENDECTOMY LAPAROSCOPIC; EXTENSIVE LYSIS OF ADHESIONS FROM PRIOR RADIATION AND SURGERY;  Surgeon: Johnathan Hausen, MD;  Location: [facility];  Service: General;  Laterality: N/A;   PARATHYROIDECTOMY     SPLENECTOMY     TEE WITHOUT CARDIOVERSION N/A 07/18/2015   Procedure: TRANSESOPHAGEAL ECHOCARDIOGRAM (TEE);  Surgeon: Thayer Headings, MD;  Location: [facility] ENDOSCOPY;  Service: Cardiovascular;  Laterality: N/A;   THYROIDECTOMY      Current Medications: Current Meds  Medication Sig   albuterol (PROVENTIL) (5 MG/ML) 0.5% nebulizer solution    ammonium lactate (LAC-HYDRIN) 12 % lotion Apply 1 application topically as needed for dry skin.   aspirin 325  MG tablet Take 1 tablet (325 mg total) by mouth daily.   azelastine (ASTELIN) 0.1 % nasal spray Place 1 spray into both nostrils daily. Use in each nostril as directed   baclofen (LIORESAL) 10 MG tablet Take 1 tablet by mouth three times daily as needed   bisoprolol (ZEBETA) 5 MG tablet Take 0.5 tablets (2.5 mg total) by mouth at bedtime.   clindamycin (CLEOCIN T) 1 % lotion Apply topically as needed (rash).   EPINEPHrine 0.3 mg/0.3 mL IJ SOAJ injection Inject into the muscle as needed for anaphylaxis.   esomeprazole (NEXIUM) 40 MG capsule TAKE 1 CAPSULE BY MOUTH TWICE DAILY BEFORE A MEAL   ezetimibe (ZETIA) 10 MG tablet Take 1 tablet (10 mg total) by mouth daily.   fluticasone (FLONASE) 50 MCG/ACT nasal spray  Use 1 spray(s) in each nostril once daily (Patient taking differently: 2 (two) times daily.)   [START ON 12/31/2022] furosemide (LASIX) 20 MG tablet Take 1 tablet (20 mg total) by mouth 3 (three) times a week.   gabapentin (NEURONTIN) 300 MG capsule TAKE 4 CAPSULES BY MOUTH IN THE MORNING AND 2 CAPSULES AT NOON AND 2 CAPSULES AT BEDTIME (Patient taking differently: TAKE 2 Casp am, 2 caps noon, 4 caps bedtime)   hydrOXYzine (VISTARIL) 50 MG capsule Take 1 capsule (50 mg total) by mouth at bedtime. (Patient taking differently: Take 50 mg by mouth as needed for itching.)   levothyroxine (SYNTHROID) 300 MCG tablet Take 300 mcg by mouth daily before breakfast.   lisinopril (ZESTRIL) 40 MG tablet Take 1 tablet by mouth once daily   LUER LOCK SAFETY SYRINGES 22G X 1-1/2" 3 ML MISC USE FOR SELF ADMINISTER OF TESTOSTERONE 2CC ONCE EVERY 2 WEEKS   Multiple Vitamin (MULTI-VITAMIN) tablet Take 1 tablet by mouth daily.   rosuvastatin (CRESTOR) 40 MG tablet Take 1 tablet by mouth once daily   Suvorexant (BELSOMRA) 10 MG TABS TAKE 1 TABLET BY MOUTH AT BEDTIME AS NEEDED (Patient taking differently: Take 1 tablet by mouth at bedtime.)   tamsulosin (FLOMAX) 0.4 MG CAPS capsule Take 1 capsule by mouth once daily   testosterone cypionate (DEPOTESTOSTERONE CYPIONATE) 200 MG/ML injection Inject 0.6 mLs (120 mg total) into the muscle once a week.   traZODone (DESYREL) 150 MG tablet Take 300 mg by mouth at bedtime. 2 tabs at bedtime   TRELEGY ELLIPTA 200-62.5-25 MCG/ACT AEPB Inhale 1 puff into the lungs daily.   VENTOLIN HFA 108 (90 Base) MCG/ACT inhaler INHALE 2 PUFFS BY MOUTH EVERY 6 HOURS AS NEEDED FOR SHORTNESS OF BREATH   verapamil (VERELAN PM) 180 MG 24 hr capsule Take 1 capsule by mouth at bedtime   [DISCONTINUED] Fluticasone-Umeclidin-Vilant (TRELEGY ELLIPTA) 200-62.5-25 MCG/ACT AEPB INHALE 1 PUFF INTO THE LUNGS DAILY     Allergies:   Ciprofloxacin, Thelma Barge officinalis], and Fluocinolone   Social History    Socioeconomic History   Marital status: Single    Spouse name: Not on file   Number of children: 0   Years of education: Not on file   Highest education level: Not on file  Occupational History   Not on file  Tobacco Use   Smoking status: Former    Types: Cigarettes    Quit date: 2013    Years since quitting: 11.2   Smokeless tobacco: Never  Vaping Use   Vaping Use: Never used  Substance and Sexual Activity   Alcohol use: Not Currently    Comment: occasionally   Drug use: Yes  Types: Marijuana   Sexual activity: Yes  Other Topics Concern   Not on file  Social History Narrative   Are you right handed or left handed? Right   Are you currently employed ? No   What is your current occupation? None   Do you live at home alone? yes   Who lives with you? no   What type of home do you live in: 1 story or 2 story? Third floor apratment      Social Determinants of Health   Financial Resource Strain: Not on file  Food Insecurity: Not on file  Transportation Needs: Not on file  Physical Activity: Not on file  Stress: Not on file  Social Connections: Not on file     Family History: The patient's family history includes Breast cancer in his maternal aunt and mother; Colon cancer in his mother; Leukemia in an other family member; Multiple myeloma in his father; Other in his mother; Parkinsonism in his father; Stroke in his maternal grandmother.  ROS:   Please see the history of present illness.     All other systems reviewed and are negative.  EKGs/Labs/Other Studies Reviewed:    The following studies were reviewed today:  Cardiac Studies & Procedures       ECHOCARDIOGRAM  ECHOCARDIOGRAM COMPLETE 11/11/2022  Narrative ECHOCARDIOGRAM REPORT    Patient Name:   JERMAN ENAMORADO Milazzo Date of Exam: 11/10/2022 Medical Rec #:  [id number]       Height:       72.0 in Accession #:    [id number]      Weight:       215.0 lb Date of Birth:  [birth year]        BSA:          2.197  m Patient Age:    83 years        BP:           136/88 mmHg Patient Gender: M               HR:           108 bpm. Exam Location:  [place]  Procedure: 2D Echo, Cardiac Doppler, Color Doppler, 3D Echo and Strain Analysis  Indications:    R00.2 Palpitations  History:        Patient has prior history of Echocardiogram examinations, most recent 07/17/2015. Palpitations, Stroke; Risk Factors:Hypertension, Dyslipidemia and Former Smoker.  Sonographer:    Coralyn Helling RDCS Referring Phys: [id number] PAULA V ROSS  IMPRESSIONS   1. Left ventricular ejection fraction by 3D volume is 45 %. The left ventricle has mildly decreased function. The left ventricle demonstrates global hypokinesis. The left ventricular internal cavity size was mildly dilated. There is mild left ventricular hypertrophy. Left ventricular diastolic parameters are consistent with Grade I diastolic dysfunction (impaired relaxation). The average left ventricular global longitudinal strain is -13.6 %. The global longitudinal strain is abnormal. 2. Right ventricular systolic function is normal. The right ventricular size is normal. There is normal pulmonary artery systolic pressure. The estimated right ventricular systolic pressure is Q000111Q mmHg. 3. The mitral valve is normal in structure. No evidence of mitral valve regurgitation. No evidence of mitral stenosis. 4. The aortic valve is normal in structure. Aortic valve regurgitation is mild. No aortic stenosis is present. 5. The inferior vena cava is normal in size with greater than 50% respiratory variability, suggesting right atrial pressure of 3 mmHg.  FINDINGS Left Ventricle: Left ventricular ejection fraction by  3D volume is 45 %. The left ventricle has mildly decreased function. The left ventricle demonstrates global hypokinesis. The average left ventricular global longitudinal strain is -13.6 %. The global longitudinal strain is abnormal. The left ventricular internal  cavity size was mildly dilated. There is mild left ventricular hypertrophy. Left ventricular diastolic parameters are consistent with Grade I diastolic dysfunction (impaired relaxation).  Right Ventricle: The right ventricular size is normal. No increase in right ventricular wall thickness. Right ventricular systolic function is normal. There is normal pulmonary artery systolic pressure. The tricuspid regurgitant velocity is 2.50 m/s, and with an assumed right atrial pressure of 3 mmHg, the estimated right ventricular systolic pressure is Q000111Q mmHg.  Left Atrium: Left atrial size was normal in size.  Right Atrium: Right atrial size was normal in size.  Pericardium: There is no evidence of pericardial effusion.  Mitral Valve: The mitral valve is normal in structure. No evidence of mitral valve regurgitation. No evidence of mitral valve stenosis.  Tricuspid Valve: The tricuspid valve is normal in structure. Tricuspid valve regurgitation is mild . No evidence of tricuspid stenosis.  Aortic Valve: The aortic valve is normal in structure. Aortic valve regurgitation is mild. No aortic stenosis is present.  Pulmonic Valve: The pulmonic valve was normal in structure. Pulmonic valve regurgitation is not visualized. No evidence of pulmonic stenosis.  Aorta: The aortic root is normal in size and structure.  Venous: The inferior vena cava is normal in size with greater than 50% respiratory variability, suggesting right atrial pressure of 3 mmHg.  IAS/Shunts: No atrial level shunt detected by color flow Doppler.   LEFT VENTRICLE PLAX 2D LVIDd:         5.40 cm         Diastology LVIDs:         4.10 cm         LV e' medial:    9.14 cm/s LV PW:         1.30 cm         LV E/e' medial:  10.0 LV IVS:        1.00 cm         LV e' lateral:   14.80 cm/s LVOT diam:     2.40 cm         LV E/e' lateral: 6.2 LVOT Area:     4.52 cm 2D Longitudinal Strain 2D Strain GLS  -13.6 % Avg:  3D Volume EF LV 3D  EF:    Left ventricul ar ejection fraction by 3D volume is 45 %.  3D Volume EF: 3D EF:        45 % LV EDV:       238 ml LV ESV:       130 ml LV SV:        108 ml  RIGHT VENTRICLE             IVC RV S prime:     14.60 cm/s  IVC diam: 1.50 cm TAPSE (M-mode): 1.4 cm RVSP:           28.0 mmHg  LEFT ATRIUM             Index        RIGHT ATRIUM           Index LA diam:        3.80 cm 1.73 cm/m   RA Pressure: 3.00 mmHg LA Vol (A2C):   48.0 ml 21.85 ml/m  RA Area:  14.80 cm LA Vol (A4C):   60.1 ml 27.35 ml/m  RA Volume:   35.50 ml  16.16 ml/m LA Biplane Vol: 55.1 ml 25.08 ml/m  AORTA Ao Root diam: 3.00 cm Ao Asc diam:  3.20 cm  MITRAL VALVE                TRICUSPID VALVE MV Area (PHT): 2.47 cm     TR Peak grad:   25.0 mmHg MV Decel Time: 307 msec     TR Vmax:        250.00 cm/s MV E velocity: 91.20 cm/s   Estimated RAP:  3.00 mmHg MV A velocity: 128.00 cm/s  RVSP:           28.0 mmHg MV E/A ratio:  0.71 SHUNTS Systemic Diam: 2.40 cm  Candee Furbish MD Electronically signed by Candee Furbish MD Signature Date/Time: 11/11/2022/7:12:41 AM    Final    MONITORS  LONG TERM MONITOR (3-14 DAYS) 11/04/2022  Narrative Patch Wear Time:  9 days and 4 hours (2024-01-08T15:23:01-499 to 2024-01-17T20:17:28-0500)  Patient had a min HR of 59 bpm, max HR of 172 bpm, and avg HR of 95 bpm. Predominant underlying rhythm was Sinus Rhythm. Isolated SVEs were rare (<1.0%), SVE Couplets were rare (<1.0%), and SVE Triplets were rare (<1.0%). Isolated VEs were rare (<1.0%), and no VE Couplets or VE Triplets were present.  No triggered events            Recent Labs: 08/05/2022: ALT 80; BUN 32; Creatinine, Ser 1.30; Potassium 4.2; Sodium 138 09/15/2022: TSH 3.84 11/16/2022: Hemoglobin 17.5; Platelets 216  Recent Lipid Panel    Component Value Date/Time   CHOL 121 05/05/2021 1137   TRIG 86.0 05/05/2021 1137   HDL 42.90 05/05/2021 1137   CHOLHDL 3 05/05/2021 1137   VLDL 17.2  05/05/2021 1137   LDLCALC 61 05/05/2021 1137        Physical Exam:    VS:  BP 132/70   Pulse (!) 110   Ht 6' (1.829 m)   Wt 215 lb 12.8 oz (97.9 kg)   SpO2 96%   BMI 29.27 kg/m     Wt Readings from Last 3 Encounters:  12/30/22 215 lb 12.8 oz (97.9 kg)  11/13/22 217 lb 12.8 oz (98.8 kg)  10/19/22 215 lb (97.5 kg)    GEN:  Well nourished, well developed in no acute distress HEENT: Normal NECK: JVD at 60 and 90 degrees with HJR CARDIAC: regular tachycardia with diastolic murmur, no rubs, gallops RESPIRATORY:  Clear to auscultation without rales, wheezing or rhonchi  ABDOMEN: Soft, non-tender, non-distended MUSCULOSKELETAL:  trace bilateral edema; No deformity  SKIN: Warm and dry NEUROLOGIC:  Alert and oriented x 3 PSYCHIATRIC:  Normal affect   ASSESSMENT:    1. HFrEF (heart failure with reduced ejection fraction) ([place])   2. Aortic atherosclerosis ([facility])   3. Blood pressure alteration   4. SVT (supraventricular tachycardia)    PLAN:     Heart Failure reduced Ejection Fraction (systolic and diastolic) - chronic - NYHA class I, Stage A, hypervolemic, etiology unclear - Diuretic regimen: after SDM he is amenable to lasix 20 mg on his non-lifting days - would recommend nightly bisoprolol 2.5 mg PO; if he does not tolerate this can trial ivabradine (no AF) - he is amenable to lisinopril 40 mg  SVT Prior history of POTs - symptomatic; no arrhythmias; will start low dose bisoprolol  Blood pressure differential  - no subclavian disease; will get CT Aorta  to rule out Coarctation; if normal will plan for conservative therapy - there presence of significant aortopathy may have new risks for his powerlifting  Hodgkin's Lymphoma s/p high dose radiation Aortic atherosclerosis - LDL goal < 55 - adding zetia 10 mg to present statin  Low T and cardiovascular risk - based on the TRANSVERSE Trial from NEJM 2023 - Testosterone therapy in middle-aged and older men with  hypogonadism compared to placebo does not increase major cardiovascular events. - The incidence of pulmonary embolism, nonfatal arrhythmia, atrial fibrillation, and acute injury was higher in subjects who received testosterone replacement therapy. - we will set an LDL goal of < 55 and BP goal < 130/80   Time Spent Directly with Patient:   I have spent a total of 40 minutes with the patient reviewing notes, imaging, EKGs, labs and examining the patient as well as establishing an assessment and plan that was discussed personally with the patient.  > 50% of time was spent in direct patient care and family and reviewing imaging with patient .          Medication Adjustments/Labs and Tests Ordered: Current medicines are reviewed at length with the patient today.  Concerns regarding medicines are outlined above.  Orders Placed This Encounter  Procedures   CT ANGIO CHEST AORTA W/CM & OR WO/CM   Basic metabolic panel   CBC   Lipid panel   Meds ordered this encounter  Medications   furosemide (LASIX) 20 MG tablet    Sig: Take 1 tablet (20 mg total) by mouth 3 (three) times a week.    Dispense:  36 tablet    Refill:  3   bisoprolol (ZEBETA) 5 MG tablet    Sig: Take 0.5 tablets (2.5 mg total) by mouth at bedtime.    Dispense:  45 tablet    Refill:  3   ezetimibe (ZETIA) 10 MG tablet    Sig: Take 1 tablet (10 mg total) by mouth daily.    Dispense:  90 tablet    Refill:  3    Patient Instructions  Medication Instructions:  Your physician has recommended you make the following change in your medication:  START: bisoprolol (Zebeta) 2.5 mg (1/2 pill) by mouth once daily at bedtime  START: furosemide (Lasix) 20 mg by mouth THREE times weekly  START: ezetimibe (Zetia) 10 mg by mouth once daily   *If you need a refill on your cardiac medications before your next appointment, please call your pharmacy*   Lab Work: IN 2 WEEKS: BMP, CBC IN 3 MONTHS: FLP  If you have labs (blood  work) drawn today and your tests are completely normal, you will receive your results only by: [place] (if you have MyChart) OR A paper copy in the mail If you have any lab test that is abnormal or we need to change your treatment, we will call you to review the results.   Testing/Procedures: Your physician has requested that you have a CT Aorta.    Follow-Up: At [facility], you and your health needs are our priority.  As part of our continuing mission to provide you with exceptional heart care, we have created designated Provider Care Teams.  These Care Teams include your primary Cardiologist (physician) and Advanced Practice Providers (APPs -  Physician Assistants and Nurse Practitioners) who all work together to provide you with the care you need, when you need it.      Your next appointment:   3-4 month(s)  Provider:   Ermalinda Barrios, PA-C          Signed, Werner Lean, MD  12/30/2022 12:41 PM    [place]

## 2022-12-30 NOTE — Patient Instructions (Addendum)
Medication Instructions:  Your physician has recommended you make the following change in your medication:  START: bisoprolol (Zebeta) 2.5 mg (1/2 pill) by mouth once daily at bedtime  START: furosemide (Lasix) 20 mg by mouth THREE times weekly  START: ezetimibe (Zetia) 10 mg by mouth once daily   *If you need a refill on your cardiac medications before your next appointment, please call your pharmacy*   Lab Work: IN 2 WEEKS: BMP, CBC IN 3 MONTHS: FLP  If you have labs (blood work) drawn today and your tests are completely normal, you will receive your results only by: [place] (if you have MyChart) OR A paper copy in the mail If you have any lab test that is abnormal or we need to change your treatment, we will call you to review the results.   Testing/Procedures: Your physician has requested that you have a CT Aorta.    Follow-Up: At [facility], you and your health needs are our priority.  As part of our continuing mission to provide you with exceptional heart care, we have created designated Provider Care Teams.  These Care Teams include your primary Cardiologist (physician) and Advanced Practice Providers (APPs -  Physician Assistants and Nurse Practitioners) who all work together to provide you with the care you need, when you need it.      Your next appointment:   3-4 month(s)  Provider:   Ermalinda Barrios, PA-C

## 2023-01-13 ENCOUNTER — Ambulatory Visit (HOSPITAL_COMMUNITY)
Admission: RE | Admit: 2023-01-13 | Discharge: 2023-01-13 | Disposition: A | Discharge status: Home / Self Care | Payer: 59 | Source: Ambulatory Visit | Attending: Internal Medicine | Admitting: Internal Medicine

## 2023-01-13 DIAGNOSIS — R6889 Other general symptoms and signs: Secondary | ICD-10-CM | POA: Insufficient documentation

## 2023-01-13 DIAGNOSIS — R031 Nonspecific low blood-pressure reading: Secondary | ICD-10-CM | POA: Diagnosis not present

## 2023-01-13 MED ORDER — IOHEXOL 350 MG/ML SOLN
75.0000 mL | Freq: Once | INTRAVENOUS | Status: AC | PRN
  Administered 2023-01-13: 75 mL via INTRAVENOUS

## 2023-01-14 ENCOUNTER — Ambulatory Visit: Payer: 59 | Attending: Internal Medicine

## 2023-01-14 DIAGNOSIS — I5020 Unspecified systolic (congestive) heart failure: Secondary | ICD-10-CM | POA: Diagnosis not present

## 2023-01-15 ENCOUNTER — Other Ambulatory Visit: Payer: Self-pay | Admitting: Allergy & Immunology

## 2023-01-15 LAB — BASIC METABOLIC PANEL
BUN/Creatinine Ratio: 22 — ABNORMAL HIGH (ref 9–20)
BUN: 23 mg/dL (ref 6–24)
CO2: 19 mmol/L — ABNORMAL LOW (ref 20–29)
Calcium: 9.3 mg/dL (ref 8.7–10.2)
Chloride: 104 mmol/L (ref 96–106)
Creatinine, Ser: 1.04 mg/dL (ref 0.76–1.27)
Glucose: 86 mg/dL (ref 70–99)
Potassium: 5.1 mmol/L (ref 3.5–5.2)
Sodium: 138 mmol/L (ref 134–144)
eGFR: 90 mL/min/{1.73_m2} (ref >59)

## 2023-01-16 ENCOUNTER — Other Ambulatory Visit: Payer: Self-pay | Admitting: Family Medicine

## 2023-01-16 DIAGNOSIS — K219 Gastro-esophageal reflux disease without esophagitis: Secondary | ICD-10-CM

## 2023-01-16 DIAGNOSIS — F5104 Psychophysiologic insomnia: Secondary | ICD-10-CM

## 2023-01-21 ENCOUNTER — Encounter: Payer: Self-pay | Admitting: Family Medicine

## 2023-02-09 ENCOUNTER — Other Ambulatory Visit: Payer: Self-pay | Admitting: Allergy & Immunology

## 2023-02-11 ENCOUNTER — Encounter: Payer: Self-pay | Admitting: Family Medicine

## 2023-02-11 ENCOUNTER — Other Ambulatory Visit: Payer: 59

## 2023-02-11 DIAGNOSIS — Z8673 Personal history of transient ischemic attack (TIA), and cerebral infarction without residual deficits: Secondary | ICD-10-CM | POA: Diagnosis not present

## 2023-02-11 LAB — CBC
HCT: 51.6 % — ABNORMAL HIGH (ref 38.5–50.0)
Hemoglobin: 17.4 g/dL — ABNORMAL HIGH (ref 13.2–17.1)
MCH: 32.5 pg (ref 27.0–33.0)
MCHC: 33.7 g/dL (ref 32.0–36.0)
MCV: 96.4 fL (ref 80.0–100.0)
MPV: 13.2 fL — ABNORMAL HIGH (ref 7.5–12.5)
Platelets: 202 10*3/uL (ref 140–400)
RBC: 5.35 10*6/uL (ref 4.20–5.80)
RDW: 12.7 % (ref 11.0–15.0)
WBC: 11.1 10*3/uL — ABNORMAL HIGH (ref 3.8–10.8)

## 2023-02-12 ENCOUNTER — Other Ambulatory Visit: Payer: Self-pay | Admitting: Family Medicine

## 2023-02-12 DIAGNOSIS — E785 Hyperlipidemia, unspecified: Secondary | ICD-10-CM

## 2023-02-16 ENCOUNTER — Other Ambulatory Visit: Payer: Self-pay | Admitting: Allergy & Immunology

## 2023-02-26 ENCOUNTER — Encounter: Payer: Self-pay | Admitting: Family Medicine

## 2023-03-19 ENCOUNTER — Other Ambulatory Visit: Payer: Self-pay | Admitting: Allergy & Immunology

## 2023-03-20 ENCOUNTER — Other Ambulatory Visit: Payer: Self-pay | Admitting: Family Medicine

## 2023-03-20 DIAGNOSIS — E785 Hyperlipidemia, unspecified: Secondary | ICD-10-CM

## 2023-03-21 ENCOUNTER — Other Ambulatory Visit: Payer: Self-pay | Admitting: Family Medicine

## 2023-03-21 MED ORDER — AZELASTINE HCL 0.1 % NA SOLN: 1.0000 | Freq: Every day | NASAL | 0 refills | Status: DC

## 2023-03-29 ENCOUNTER — Encounter: Payer: Self-pay | Admitting: Family Medicine

## 2023-03-29 ENCOUNTER — Ambulatory Visit (INDEPENDENT_AMBULATORY_CARE_PROVIDER_SITE_OTHER): Payer: 59 | Admitting: Family Medicine

## 2023-03-29 VITALS — BP 128/78 | HR 106 | Temp 99.6°F | Resp 16 | Ht 72.0 in | Wt 216.0 lb

## 2023-03-29 DIAGNOSIS — I5020 Unspecified systolic (congestive) heart failure: Secondary | ICD-10-CM | POA: Diagnosis not present

## 2023-03-29 DIAGNOSIS — I10 Essential (primary) hypertension: Secondary | ICD-10-CM

## 2023-03-29 DIAGNOSIS — J4541 Moderate persistent asthma with (acute) exacerbation: Secondary | ICD-10-CM | POA: Diagnosis not present

## 2023-03-29 MED ORDER — PREDNISONE 20 MG PO TABS
40.0000 mg | ORAL_TABLET | Freq: Every day | ORAL | 0 refills | Status: DC
Start: 2023-03-29 — End: 2023-04-05

## 2023-03-29 MED ORDER — ALBUTEROL-BUDESONIDE 90-80 MCG/ACT IN AERO
2.0000 | INHALATION_SPRAY | RESPIRATORY_TRACT | 2 refills | Status: DC | PRN
Start: 2023-03-29 — End: 2023-10-18

## 2023-03-29 NOTE — Assessment & Plan Note (Addendum)
I will add a 5 day burst of prednisone. I will also try prescribing albuterol-budesonide for use for acute exacerbations as an alternative to longer steroid courses.

## 2023-03-29 NOTE — Assessment & Plan Note (Signed)
Compensated. Continue bisoprolol 5 mg daily, lisinopril 40 mg daily, and PRN furosemide. I recommend he weight himself daily. If he gains more than 5 lbs in 5 days, he should take his furosemide. I recommend he avoid OTC NSAIDs due to risk for causing decompensation of his HF.

## 2023-03-29 NOTE — Assessment & Plan Note (Signed)
Blood pressure is in good control. Continue lisinopril 40 mg daily and bisoprolol 5 mg daily.

## 2023-03-29 NOTE — Progress Notes (Signed)
[facility] [address] [place] [address] Dept: [phone] Dept Fax: [phone]  Office Visit  Subjective:    Patient ID: Troy [name], male    DOB: [birth year], 47 y.o..   MRN: [id number]  Chief Complaint  Patient presents with   URI   History of Present Illness:  Patient is in today with a recent issue with congestion, productive cough, chest tightness and wheezing. Troy [name] has a history of moderate persistent asthma. He is managed on Trelegy daily and PRN albuterol. He notes that he has been audibly wheezing. He had taken extras doses of his Trelegy. He does not want to take oral steroids for more than 5 days, as he feels this caused issues he developed with cystic acne.  Troy [name] was diagnosed with HFrEF since his last visit with me. He is currently managed on bisoprolol 5 mg daily, lisinopril 40 mg daily, and PRN furosemide. He notes his swelling is well-controlled currently. He admits to occasional use of OTC NSAIDs in light of his competitive weight lifting and pain he experiences in joints. He feels he would not have other alternatives to deal with such pain.  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   History of splenectomy 02/02/2021   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   Erectile dysfunction 02/02/2021   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   Pulmonary fibrosis (HCC) 02/02/2021   Peripheral neuropathy 02/02/2021   History of hepatitis C 02/02/2021   Back pain 02/02/2021   Hyperlipidemia 02/02/2021   Moderate persistent asthma, uncomplicated 08/13/2020   History of Hodgkin's lymphoma 08/13/2020   Chronic rhinitis 08/13/2020   Not currently working due to disabled status 08/13/2020   Pain in joint of left knee 07/25/2020   History of thyroid cancer 06/09/2020   Arthralgia of left elbow 05/01/2020   Autonomic dysfunction 04/23/2020   Iron deficiency anemia due to chronic blood loss 01/18/2020   Microalbuminuria 01/18/2020   Chronic insomnia 01/17/2020   Large liver 01/17/2020   Prediabetes 01/11/2019   Daytime somnolence 07/22/2017   History of basal cell carcinoma (BCC) 11/30/2016   GERD (gastroesophageal reflux disease) 08/25/2016   Essential hypertension 08/25/2016   Substance induced mood disorder (HCC)    History of stroke    Past Surgical History:  Procedure Laterality Date   COLONOSCOPY  07/04/2019   done for hematochezia and FH col ca-mother: Mod int hem w/inflamm.  No adenomatous polyps.   INCISIONAL HERNIA REPAIR  06/2019   RUQ (prev lap append site)   LAPAROSCOPIC APPENDECTOMY N/A 03/25/2017   Procedure: APPENDECTOMY LAPAROSCOPIC; EXTENSIVE LYSIS OF ADHESIONS FROM PRIOR RADIATION AND SURGERY;  Surgeon: Luretha Murphy, MD;  Location: [facility];  Service: General;  Laterality: N/A;   PARATHYROIDECTOMY     SPLENECTOMY  TEE WITHOUT CARDIOVERSION N/A 07/18/2015   Procedure: TRANSESOPHAGEAL ECHOCARDIOGRAM (TEE);  Surgeon: [name], MD;  Location: [facility] ENDOSCOPY;  Service: Cardiovascular;  Laterality: N/A;   THYROIDECTOMY     Family History  Problem Relation Age of Onset   Breast cancer Mother        dx early 44s    Colon cancer Mother        dx early 25s   Other Mother        NTHL1 homozygous   Parkinsonism Father    Multiple myeloma Father        dx 11s   Breast cancer Maternal Aunt    Stroke Maternal Grandmother    Leukemia Other        dx <20; two paternal first cousins once removed   Outpatient Medications Prior to Visit  Medication Sig Dispense Refill   albuterol (PROVENTIL) (5 MG/ML) 0.5% nebulizer solution      ammonium lactate (LAC-HYDRIN) 12 % lotion Apply 1 application topically as needed for dry skin.     aspirin 325 MG tablet Take 1 tablet (325 mg total) by mouth daily. 30 tablet 0   azelastine (ASTELIN) 0.1 % nasal spray Place 1 spray into both nostrils daily. Use in each nostril as directed 30 mL 0   baclofen (LIORESAL) 10 MG tablet Take 1 tablet by mouth three times daily as needed 30 tablet 5   bisoprolol (ZEBETA) 5 MG tablet Take 0.5 tablets (2.5 mg total) by mouth at bedtime. 45 tablet 3   clindamycin (CLEOCIN T) 1 % lotion Apply topically as needed (rash).     EPINEPHrine 0.3 mg/0.3 mL IJ SOAJ injection Inject into the muscle as needed for anaphylaxis.     esomeprazole (NEXIUM) 40 MG capsule TAKE 1 CAPSULE BY MOUTH TWICE DAILY BEFORE A MEAL 180 capsule 3   ezetimibe (ZETIA) 10 MG tablet Take 1 tablet (10 mg total) by mouth daily. 90 tablet 3   fluticasone (FLONASE) 50 MCG/ACT nasal spray Use 1 spray(s) in each nostril once daily (Patient taking differently: 2 (two) times daily.) 16 g 11   Fluticasone-Umeclidin-Vilant (TRELEGY ELLIPTA) 200-62.5-25 MCG/ACT AEPB INHALE 1 PUFF INTO LUNGS ONCE DAILY 60 each 2   furosemide (LASIX) 20 MG tablet Take 1 tablet (20 mg total) by mouth 3 (three) times a week. 36 tablet 3   gabapentin (NEURONTIN) 300 MG capsule TAKE 4 CAPSULES BY MOUTH IN THE MORNING AND 2 CAPSULES AT NOON AND 2 CAPSULES AT BEDTIME (Patient taking differently: TAKE 2 Casp am, 2 caps noon, 4 caps bedtime) 240 capsule 5   hydrOXYzine (VISTARIL) 50 MG capsule Take 1 capsule (50  mg total) by mouth at bedtime. (Patient taking differently: Take 50 mg by mouth as needed for itching.) 90 capsule 3   levothyroxine (SYNTHROID) 300 MCG tablet Take 300 mcg by mouth daily before breakfast.     lisinopril (ZESTRIL) 40 MG tablet Take 1 tablet by mouth once daily 90 tablet 3   LUER LOCK SAFETY SYRINGES 22G X 1-1/2" 3 ML MISC USE FOR SELF ADMINISTER OF TESTOSTERONE 2CC ONCE EVERY 2 WEEKS     Multiple Vitamin (MULTI-VITAMIN) tablet Take 1 tablet by mouth daily.     rosuvastatin (CRESTOR) 40 MG tablet Take 1 tablet by mouth once daily 90 tablet 3   Suvorexant (BELSOMRA) 10 MG TABS Take 1 tablet (10 mg total) by mouth at bedtime. 30 tablet 2   tamsulosin (FLOMAX) 0.4 MG CAPS capsule Take 1 capsule by mouth once  daily 30 capsule 11   testosterone cypionate (DEPOTESTOSTERONE CYPIONATE) 200 MG/ML injection Inject 0.6 mLs (120 mg total) into the muscle once a week. 10 mL 0   traZODone (DESYREL) 150 MG tablet TAKE 2 TABLETS BY MOUTH AT BEDTIME 180 tablet 3   VENTOLIN HFA 108 (90 Base) MCG/ACT inhaler INHALE 2 PUFFS BY MOUTH EVERY 6 HOURS AS NEEDED FOR SHORTNESS OF BREATH 18 g 0   verapamil (VERELAN PM) 180 MG 24 hr capsule Take 1 capsule by mouth at bedtime 90 capsule 3   No facility-administered medications prior to visit.   Allergies  Allergen Reactions   Ciprofloxacin Other (See Comments)    Spontaneous rupture of tendon   Earnestine Leys Officinalis] Anaphylaxis and Hives   Fluocinolone Other (See Comments)    "spontaneous rupture of tendons" per pt dr.     Dione Housekeeper:   Today's Vitals   03/29/23 1359  BP: 128/78  Pulse: (!) 106  Resp: 16  Temp: 99.6 F (37.6 C)  SpO2: 96%  Weight: 216 lb (98 kg)  Height: 6' (1.829 m)   Body mass index is 29.29 kg/m.   General: Well developed, well nourished. No acute distress. Moderately diaphoretic. HEENT: Normocephalic, non-traumatic.Eyes clear. External ears normal. EAC and TMs normal   bilaterally. Nose clear without congestion or  rhinorrhea. Mucous membranes moist. Oropharynx   clear. Good dentition. Neck: Supple. No lymphadenopathy. No thyromegaly. Lungs: Diffuse wheezing throughout chest. No rales or rhonchi. Extremities: No edema noted. Psych: Alert and oriented. Normal mood and affect.  Health Maintenance Due  Topic Date Due   Medicare Annual Wellness (AWV)  05/22/2021   COVID-19 Vaccine (5 - 2023-24 season) 08/30/2022   Colonoscopy  Never done     Assessment & Plan:   Problem List Items Addressed This Visit       Cardiovascular and Mediastinum   Essential hypertension    Blood pressure is in good control. Continue lisinopril 40 mg daily and bisoprolol 5 mg daily.      HFrEF (heart failure with reduced ejection fraction) (HCC)    Compensated. Continue bisoprolol 5 mg daily, lisinopril 40 mg daily, and PRN furosemide. I recommend he weight himself daily. If he gains more than 5 lbs in 5 days, he should take his furosemide. I recommend he avoid OTC NSAIDs due to risk for causing decompensation of his HF.        Respiratory   Moderate persistent asthma with exacerbation - Primary    I will add a 5 day burst of prednisone. I will also try prescribing albuterol-budesonide for use for acute exacerbations as an alternative to longer steroid courses.       Relevant Medications   Albuterol-Budesonide 90-80 MCG/ACT AERO   predniSONE (DELTASONE) 20 MG tablet    Return in about 1 week (around 04/05/2023) for Reassessment.   Loyola Mast, MD

## 2023-03-30 ENCOUNTER — Encounter: Payer: Self-pay | Admitting: Family Medicine

## 2023-03-30 ENCOUNTER — Telehealth: Payer: Self-pay

## 2023-03-30 NOTE — Telephone Encounter (Signed)
*  Primary  PA request received for Airsupra 90-80MCG/ACT aerosol  PA was submitted to [employer] Part D and is pending additional questions/determination  Key: [id number]

## 2023-03-31 NOTE — Telephone Encounter (Signed)
Spoke with pharmacy regarding Albuterol-Budesonide per pharmacy this prescription only come in the brand name Airsupra at their location this medication is not covered under patient insurance. Is there something else that could be sent in for patient? Please advise

## 2023-03-31 NOTE — Telephone Encounter (Signed)
Airsupra is denied because it is not on your plan's Drug List (formulary). Medication authorization requires the following: (1) You need to try two (2) of these covered drugs: (a) Levalbuterol HFA. (b) Symbicort. (2) OR your doctor needs to give us specific medical reasons why two (2) of the covered drug(s) are not appropriate for you. 

## 2023-04-01 ENCOUNTER — Encounter: Payer: Self-pay | Admitting: Internal Medicine

## 2023-04-01 ENCOUNTER — Ambulatory Visit: Payer: 59 | Attending: Internal Medicine

## 2023-04-01 DIAGNOSIS — I700 Atherosclerosis of aorta: Secondary | ICD-10-CM

## 2023-04-01 LAB — LIPID PANEL
Chol/HDL Ratio: 4.6 ratio (ref 0.0–5.0)
Cholesterol, Total: 138 mg/dL (ref 100–199)
HDL: 30 mg/dL — ABNORMAL LOW (ref >39)
LDL Chol Calc (NIH): 91 mg/dL (ref 0–99)
Triglycerides: 86 mg/dL (ref 0–149)
VLDL Cholesterol Cal: 17 mg/dL (ref 5–40)

## 2023-04-03 ENCOUNTER — Encounter: Payer: Self-pay | Admitting: Family Medicine

## 2023-04-03 ENCOUNTER — Encounter: Payer: Self-pay | Admitting: Internal Medicine

## 2023-04-04 ENCOUNTER — Telehealth: Payer: Self-pay

## 2023-04-04 DIAGNOSIS — E7849 Other hyperlipidemia: Secondary | ICD-10-CM

## 2023-04-04 NOTE — Telephone Encounter (Signed)
Patient notified via MyChart. Referral has been placed.  

## 2023-04-04 NOTE — Telephone Encounter (Signed)
-----   Message from Christell Constant, MD sent at 04/03/2023  2:26 PM EDT ----- Results: LDL is above goal Plan: Lipid Clinic eval  Christell Constant, MD

## 2023-04-05 ENCOUNTER — Ambulatory Visit (INDEPENDENT_AMBULATORY_CARE_PROVIDER_SITE_OTHER): Payer: 59 | Admitting: Family Medicine

## 2023-04-05 ENCOUNTER — Encounter: Payer: Self-pay | Admitting: Family Medicine

## 2023-04-05 VITALS — BP 150/80 | HR 122 | Temp 98.0°F | Ht 72.0 in | Wt 214.6 lb

## 2023-04-05 DIAGNOSIS — J4541 Moderate persistent asthma with (acute) exacerbation: Secondary | ICD-10-CM

## 2023-04-05 DIAGNOSIS — R062 Wheezing: Secondary | ICD-10-CM

## 2023-04-05 NOTE — Assessment & Plan Note (Signed)
Mr. Markin did not respond to a burst of steroids. It is unclear if his current respiratory issues relate to his asthma or some other cause. I recommended he plan on seeing Dr. Dellis Anes for his input on this.

## 2023-04-05 NOTE — Progress Notes (Signed)
[facility] [address] [place] [address] Dept: [phone] Dept Fax: [phone]  Office Visit  Subjective:    Patient ID: Troy [name], male    DOB: Oct 24, 1975, 47 y.o..   MRN: [id number]  Chief Complaint  Patient presents with   Medical Management of Chronic Issues    1 week f/u.   Not feeling any better. Mucinex has helped some.     History of Present Illness:  Patient is in today for reassessment of his respiratory symptoms. I saw Troy [name] last week with recent congestion, productive cough, chest tightness and wheezing. Troy [name] has a history of moderate persistent asthma. He is managed on Trelegy daily and PRN albuterol. He notes that he had been audibly wheezing. He had taken extras doses of his Trelegy. I prescribed a 5-day course of prednisone. He feels this did not appreciably change his symptoms.  Troy [name] discussed with me about a number of concerns he has for his health care. He feels he is not listened to, that his knowledge and experience related to his health issues is discounted, and that there is poor communication and coordination among his care team.   Past Medical History: Patient Active Problem List   Diagnosis Date Noted   Moderate persistent asthma with exacerbation 03/29/2023   SVT (supraventricular tachycardia) 12/30/2022   Aortic atherosclerosis (HCC) 12/30/2022   HFrEF (heart failure with reduced ejection fraction) (HCC) 12/30/2022   Blood pressure alteration 12/30/2022   Erythrocytosis 11/13/2022   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   Lower urinary tract symptoms (LUTS) 06/02/2022   Rotator cuff tear, right 03/03/2022   History of squamous cell carcinoma in situ (SCCIS) of skin 02/22/2022   Osteoarthritis of acromioclavicular joint 10/15/2021   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   History of splenectomy 02/02/2021   Postoperative  hypothyroidism 02/02/2021   Erectile dysfunction 02/02/2021   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   Pulmonary fibrosis (HCC) 02/02/2021   Peripheral neuropathy 02/02/2021   History of hepatitis C 02/02/2021   Back pain 02/02/2021   Hyperlipidemia 02/02/2021   Moderate persistent asthma, uncomplicated 08/13/2020   History of Hodgkin's lymphoma 08/13/2020   Chronic rhinitis 08/13/2020   Not currently working due to disabled status 08/13/2020   Pain in joint of left knee 07/25/2020   History of thyroid cancer 06/09/2020   Arthralgia of left elbow 05/01/2020   Autonomic dysfunction 04/23/2020   Iron deficiency anemia due to chronic blood loss 01/18/2020   Microalbuminuria 01/18/2020   Chronic insomnia 01/17/2020   Large liver 01/17/2020   Prediabetes 01/11/2019   Daytime somnolence 07/22/2017   History of basal cell carcinoma (BCC) 11/30/2016   GERD (gastroesophageal reflux disease) 08/25/2016   Essential hypertension 08/25/2016   Substance induced mood disorder (HCC)    History of stroke    Past Surgical History:  Procedure Laterality Date   COLONOSCOPY  07/04/2019   done for hematochezia and FH col ca-mother: Mod int hem w/inflamm.  No adenomatous polyps.   INCISIONAL HERNIA REPAIR  06/2019   RUQ (prev lap append site)   LAPAROSCOPIC APPENDECTOMY N/A 03/25/2017   Procedure: APPENDECTOMY LAPAROSCOPIC; EXTENSIVE LYSIS OF ADHESIONS FROM PRIOR RADIATION AND SURGERY;  Surgeon: Luretha Murphy, MD;  Location: [facility];  Service: General;  Laterality: N/A;   PARATHYROIDECTOMY     SPLENECTOMY     TEE WITHOUT CARDIOVERSION N/A 07/18/2015   Procedure: TRANSESOPHAGEAL ECHOCARDIOGRAM (TEE);  Surgeon: [name], MD;  Location: [facility] ENDOSCOPY;  Service: Cardiovascular;  Laterality: N/A;   THYROIDECTOMY     Family History  Problem Relation Age of Onset   Breast cancer Mother        dx early 51s   Colon cancer Mother        dx early 100s   Other Mother        NTHL1 homozygous   Parkinsonism  Father    Multiple myeloma Father        dx 86s   Breast cancer Maternal Aunt    Stroke Maternal Grandmother    Leukemia Other        dx <20; two paternal first cousins once removed   Outpatient Medications Prior to Visit  Medication Sig Dispense Refill   albuterol (PROVENTIL) (5 MG/ML) 0.5% nebulizer solution      Albuterol-Budesonide 90-80 MCG/ACT AERO Inhale 2 Inhalations into the lungs every 4 (four) hours as needed. Maximum 12 inhalations per day. 10.7 g 2   ammonium lactate (LAC-HYDRIN) 12 % lotion Apply 1 application topically as needed for dry skin.     aspirin 325 MG tablet Take 1 tablet (325 mg total) by mouth daily. 30 tablet 0   azelastine (ASTELIN) 0.1 % nasal spray Place 1 spray into both nostrils daily. Use in each nostril as directed 30 mL 0   baclofen (LIORESAL) 10 MG tablet Take 1 tablet by mouth three times daily as needed 30 tablet 5   bisoprolol (ZEBETA) 5 MG tablet Take 0.5 tablets (2.5 mg total) by mouth at bedtime. 45 tablet 3   clindamycin (CLEOCIN T) 1 % lotion Apply topically as needed (rash).     EPINEPHrine 0.3 mg/0.3 mL IJ SOAJ injection Inject into the muscle as needed for anaphylaxis.     esomeprazole (NEXIUM) 40 MG capsule TAKE 1 CAPSULE BY MOUTH TWICE DAILY BEFORE A MEAL 180 capsule 3   ezetimibe (ZETIA) 10 MG tablet Take 1 tablet (10 mg total) by mouth daily. 90 tablet 3   fluticasone (FLONASE) 50 MCG/ACT nasal spray Use 1 spray(s) in each nostril once daily (Patient taking differently: 2 (two) times daily.) 16 g 11   Fluticasone-Umeclidin-Vilant (TRELEGY ELLIPTA) 200-62.5-25 MCG/ACT AEPB INHALE 1 PUFF INTO LUNGS ONCE DAILY 60 each 2   furosemide (LASIX) 20 MG tablet Take 1 tablet (20 mg total) by mouth 3 (three) times a week. 36 tablet 3   gabapentin (NEURONTIN) 300 MG capsule TAKE 4 CAPSULES BY MOUTH IN THE MORNING AND 2 CAPSULES AT NOON AND 2 CAPSULES AT BEDTIME (Patient taking differently: TAKE 2 Casp am, 2 caps noon, 4 caps bedtime) 240 capsule 5    hydrOXYzine (VISTARIL) 50 MG capsule Take 1 capsule (50 mg total) by mouth at bedtime. (Patient taking differently: Take 50 mg by mouth as needed for itching.) 90 capsule 3   levothyroxine (SYNTHROID) 300 MCG tablet Take 300 mcg by mouth daily before breakfast.     lisinopril (ZESTRIL) 40 MG tablet Take 1 tablet by mouth once daily 90 tablet 3   LUER LOCK SAFETY SYRINGES 22G X 1-1/2" 3 ML MISC USE FOR SELF ADMINISTER OF TESTOSTERONE 2CC ONCE EVERY 2 WEEKS     Multiple Vitamin (MULTI-VITAMIN) tablet Take 1 tablet by mouth daily.     rosuvastatin (CRESTOR) 40 MG tablet Take 1 tablet by mouth once daily 90 tablet 3   Suvorexant (BELSOMRA) 10 MG TABS Take 1 tablet (10 mg total) by mouth at bedtime. 30 tablet 2   tamsulosin (FLOMAX) 0.4 MG  CAPS capsule Take 1 capsule by mouth once daily 30 capsule 11   testosterone cypionate (DEPOTESTOSTERONE CYPIONATE) 200 MG/ML injection Inject 0.6 mLs (120 mg total) into the muscle once a week. 10 mL 0   traZODone (DESYREL) 150 MG tablet TAKE 2 TABLETS BY MOUTH AT BEDTIME 180 tablet 3   VENTOLIN HFA 108 (90 Base) MCG/ACT inhaler INHALE 2 PUFFS BY MOUTH EVERY 6 HOURS AS NEEDED FOR SHORTNESS OF BREATH 18 g 0   predniSONE (DELTASONE) 20 MG tablet Take 2 tablets (40 mg total) by mouth daily with breakfast. 10 tablet 0   No facility-administered medications prior to visit.   Allergies  Allergen Reactions   Ciprofloxacin Other (See Comments)    Spontaneous rupture of tendon   Earnestine Leys Officinalis] Anaphylaxis and Hives   Fluocinolone Other (See Comments)    "spontaneous rupture of tendons" per pt dr.     Dione Housekeeper:   Today's Vitals   04/05/23 1418 04/05/23 1433  BP: (!) 170/80 (!) 150/80  Pulse: (!) 122   Temp: 98 F (36.7 C)   TempSrc: Temporal   SpO2: 96%   Weight: 214 lb 9.6 oz (97.3 kg)   Height: 6' (1.829 m)    Body mass index is 29.1 kg/m.   General: Well developed, well nourished. No acute distress. Lungs: Continued diffuse wheezing with  some possible rhonchi scattered in the lungs. Psych: Alert and oriented. Normal mood and affect.  Health Maintenance Due  Topic Date Due   Medicare Annual Wellness (AWV)  05/22/2021   Colonoscopy  Never done   Imaging: CT Angio of Chest and Aorta (01/13/2023) IMPRESSION: 1. Unremarkable CT angiogram of the chest. 2. Left lung base 4 mm nodule that need not be further followed. 3. Bilateral gynecomastia 4. No acute cardiopulmonary process.    Assessment & Plan:   Problem List Items Addressed This Visit       Respiratory   Moderate persistent asthma with exacerbation    Troy [name] did not respond to a burst of steroids. It is unclear if his current respiratory issues relate to his asthma or some other cause. I recommended he plan on seeing Dr. Dellis Anes for his input on this.      Other Visit Diagnoses     Wheezing    -  Primary   Etiology remains unclear. I will check a CBC and CXR to see if there is any indication of something that may need an antibiotic to resolve.   Relevant Orders   DG Chest 2 View   CBC with Differential/Platelet       Return for Follow-up after testing/imaging.   Loyola Mast, MD

## 2023-04-06 ENCOUNTER — Encounter: Payer: Self-pay | Admitting: Family Medicine

## 2023-04-06 ENCOUNTER — Encounter: Payer: Self-pay | Admitting: Hematology

## 2023-04-06 ENCOUNTER — Telehealth: Payer: Self-pay

## 2023-04-06 ENCOUNTER — Telehealth: Payer: Self-pay | Admitting: Family Medicine

## 2023-04-06 ENCOUNTER — Ambulatory Visit (INDEPENDENT_AMBULATORY_CARE_PROVIDER_SITE_OTHER): Payer: 59

## 2023-04-06 DIAGNOSIS — R062 Wheezing: Secondary | ICD-10-CM | POA: Diagnosis not present

## 2023-04-06 DIAGNOSIS — R059 Cough, unspecified: Secondary | ICD-10-CM | POA: Diagnosis not present

## 2023-04-06 LAB — CBC WITH DIFFERENTIAL/PLATELET
Basophils Relative: 0 % (ref 0.0–3.0)
Eosinophils Relative: 0 % (ref 0.0–5.0)
HCT: 56.4 % — ABNORMAL HIGH (ref 39.0–52.0)
Hemoglobin: 18.3 g/dL (ref 13.0–17.0)
Lymphocytes Relative: 35 % (ref 12.0–46.0)
MCHC: 32.4 g/dL (ref 30.0–36.0)
MCV: 98.8 fl (ref 78.0–100.0)
Monocytes Relative: 20 % — ABNORMAL HIGH (ref 3.0–12.0)
Neutrophils Relative %: 45 % (ref 43.0–77.0)
Platelets: 226 10*3/uL (ref 150.0–400.0)
RBC: 5.7 Mil/uL (ref 4.22–5.81)
RDW: 15.7 % — ABNORMAL HIGH (ref 11.5–15.5)
WBC: 8.6 10*3/uL (ref 4.0–10.5)

## 2023-04-06 NOTE — Telephone Encounter (Signed)
Clydie Braun from main lab calling in critical lab for Hemoglobin of 18.1 please advise.

## 2023-04-06 NOTE — Telephone Encounter (Signed)
Jae Dire is calling from [employer] at [facility] wanting to know if pt's x-ray from today 04/06/23 needs to be marked STAT. The pt told her he was suppose to get a call back today with the results. Please advise Jae Dire or Aundra Millet at [phone].

## 2023-04-06 NOTE — Telephone Encounter (Signed)
Per Dr. Veto Kemps, x-ray in chart as of now and radiology report does not have to be stat. Tried Technical sales engineer or Jae Dire at provided number no answer or way to [facility]

## 2023-04-07 ENCOUNTER — Encounter: Payer: Self-pay | Admitting: Allergy & Immunology

## 2023-04-07 ENCOUNTER — Other Ambulatory Visit: Payer: Self-pay

## 2023-04-07 ENCOUNTER — Ambulatory Visit (INDEPENDENT_AMBULATORY_CARE_PROVIDER_SITE_OTHER): Payer: 59 | Admitting: Allergy & Immunology

## 2023-04-07 VITALS — BP 142/98 | HR 102 | Temp 99.0°F | Resp 20 | Ht 72.0 in | Wt 215.7 lb

## 2023-04-07 DIAGNOSIS — J3089 Other allergic rhinitis: Secondary | ICD-10-CM | POA: Diagnosis not present

## 2023-04-07 DIAGNOSIS — J302 Other seasonal allergic rhinitis: Secondary | ICD-10-CM

## 2023-04-07 DIAGNOSIS — J0100 Acute maxillary sinusitis, unspecified: Secondary | ICD-10-CM

## 2023-04-07 DIAGNOSIS — J4550 Severe persistent asthma, uncomplicated: Secondary | ICD-10-CM | POA: Diagnosis not present

## 2023-04-07 DIAGNOSIS — R058 Other specified cough: Secondary | ICD-10-CM | POA: Diagnosis not present

## 2023-04-07 MED ORDER — EPINEPHRINE 0.3 MG/0.3ML IJ SOAJ: 0.3000 mg | INTRAMUSCULAR | 2 refills | Status: DC | PRN

## 2023-04-07 NOTE — Progress Notes (Unsigned)
FOLLOW UP  Date of Service/Encounter:  04/07/23  Assessment:   Moderate persistent asthma - much better on Trelegy, but with current SOB flare, unresponsive to prednisone  Chest CT in future?   heart failure with reduced ejection fraction - on Lasix for two antihypertensives    Elevated AEC in the past (600 in March 2020) - previously on Nucala, although he did not feel that this provided much relief, although prednisone use seems to have increased since stopping the Nucala   Perennial and seasonal allergic rhinitis (dust mites, cat, dog, grasses, molds, trees, and ragweed)   Right-sided abdominal pain - getting an abdominal ultrasound today   Bilateral tongue lesions - sending to ENT  Hypercholesterolemia - recommended Repatha   Complicated past medical history, including Hodgkin's lymphoma at age 16 and subsequent malignancies since that time  Plan/Recommendations:   1. Moderate persistent asthma, uncomplicated - Lung testing looked much lower today.  - We are starting the doxycycline which can help with inflammation in your lungs as well.  - Let's start you on budesonide + Xopenex neb solution every 6 hours for the next week and then as needed - This will help [place] avoid more prednisone.  - Daily controller medication(s): Trelegy 200/62.5/25 one puff once daily + - Prior to physical activity: Xopenex 2 puffs 10-15 minutes before physical activity. - Rescue medications: Xopenex 4 puffs every 4-6 hours as needed OR budesonide + Xopenex neb solution every 6 hours - Asthma control goals:  * Full participation in all desired activities (may need albuterol before activity) * Albuterol use two time or less a week on average (not counting use with activity) * Cough interfering with sleep two time or less a month * Oral steroids no more than once a year * No hospitalizations  2. Chronic rhinitis (dust mites, cat, dog, grasses, molds, trees, and ragweed) - Continue with the  Claritin (loratadine) 10mg  daily.  - Continue with Allegra (fexofenadine) 10mg  daily.  - Continue with Allercort one spray per nostril daily. - Continue with Astelin one spray per nostril daily. - CPT codes provided for allergy shots.  - This is a curative process and can help with the nasal congestion.   3. Acute sinusitis - Start doxycycline 100mg  twice daily for ten days. - This also has an anti-inflammatory effect and can help with your asthma.   4. 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  - We will continue to monitor.   - Consider Xolair to help   4. Oral lesions - Referral placed for ENT. - Hopefully they will call you in one week or so.   5. Return in about 2 months (around 06/07/2023).    Subjective:   Troy [name] is a 47 y.o. male presenting today for follow up of  Chief Complaint  Patient presents with   Asthma   Follow-up   Cough    C/o x 2 weeks ago my have had an asthma flare.   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   Lower urinary tract symptoms (LUTS) 06/02/2022   Rotator cuff tear, right  03/03/2022   History of squamous cell carcinoma in situ (SCCIS) of skin 02/22/2022   Osteoarthritis of acromioclavicular joint 10/15/2021   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   History of splenectomy 02/02/2021   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   Erectile dysfunction 02/02/2021   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   Pulmonary fibrosis (HCC) 02/02/2021   Peripheral neuropathy  02/02/2021   History of hepatitis C 02/02/2021   Back pain 02/02/2021   Hyperlipidemia 02/02/2021   Moderate persistent asthma, uncomplicated 08/13/2020   History of Hodgkin's lymphoma 08/13/2020   Chronic rhinitis 08/13/2020   Not currently working due to disabled status 08/13/2020   Pain in joint of left knee 07/25/2020   History of thyroid cancer 06/09/2020   Arthralgia of left elbow 05/01/2020   Autonomic dysfunction 04/23/2020   Iron deficiency anemia due to chronic blood loss 01/18/2020   Microalbuminuria 01/18/2020   Chronic insomnia 01/17/2020   Large liver 01/17/2020   Prediabetes 01/11/2019   Daytime somnolence 07/22/2017   History of basal cell carcinoma (BCC) 11/30/2016   GERD (gastroesophageal reflux disease) 08/25/2016   Essential hypertension 08/25/2016   Substance induced mood disorder (HCC)    History of stroke     History obtained from: chart review and patient.  [name] is a 47 y.o. male presenting for a sick visit.  He was last seen in January 2024.  At that time, his lung testing looked awesome.  We continue with Trelegy 200 mcg 1 puff once daily.  We also continue with albuterol as needed.  For his rhinitis, we continue with Claritin as well as Allegra, Ala-Cort, and Astelin.  We gave him codes for allergy shots.  We ordered a liver and gallbladder ultrasound due to right-sided abdominal pain.  We referred him to ENT for his oral lesions.  Since last visit, he has mostly done well, but he is here today for a sick visit.   Asthma/Respiratory Symptom History: He was mowing his mother's yard two weekends ago. He developed SOB and wheezing and nasal congestion.  He has remained on his Trelegy one puff once daily. He saw Dr. Veto Kemps on June 18th. At that visit, he was placed on prednisone. He was also using extra doses of his Trelegy.  He was prescribed AirSupra by Dr. Veto Kemps at that time as well. He followed up one week later and was still having issues (this was June  25th). He did not feel that the prednisone helped him at all. He did have a CXR done which has not been read yet; my review looks like this was normal.   He does have an albuterol nebulizer at home. However, he tells me that using this makes it worse when he is having trouble. He has trouble coughing up stuff when he is feeling like this. He has only ever used albuterol; he has never been on Xopenex. We did give him some Xopenex in the office when he did his spirometry and used Xopenex 4 puffs to treat his lung disease. He would prefer that we send this in for his nebulizer.   He is not very interested in another round of prednisone. He tells me that this tends to make him break out in acne on his face. He apparently was on 6 weeks of prednisone at some point but Dr. Veto Kemps, although the timing of this is unclear. He has not been febrile during this time.   He does have a history of heart failure  with reduced ejection fraction. He follows Dr. Izora Ribas for this. This might be secondary to his history of lymphoma with high dose radiation. His echocardiogram has remained stable since 2016, per the Cardiology notes.   Otherwise, there have been no changes to his past medical history, surgical history, family history, or social history.    Review of Systems  Constitutional: Negative.  Negative for chills, fever, malaise/fatigue and weight loss.  HENT:  Positive for congestion. Negative for ear discharge, ear pain and sinus pain.   Eyes:  Negative for pain, discharge and redness.  Respiratory:  Positive for shortness of breath and wheezing. Negative for cough and sputum production.   Cardiovascular: Negative.  Negative for chest pain and palpitations.  Gastrointestinal:  Negative for abdominal pain, constipation, diarrhea, heartburn, nausea and vomiting.  Skin: Negative.  Negative for itching and rash.  Neurological:  Negative for dizziness and headaches.  Endo/Heme/Allergies:  Negative for  environmental allergies. Does not bruise/bleed easily.  All other systems reviewed and are negative.      Objective:   Blood pressure (!) 142/98, pulse (!) 102, temperature 99 F (37.2 C), resp. rate 20, height 6' (1.829 m), weight 215 lb 11.2 oz (97.8 kg), SpO2 94 %. Body mass index is 29.25 kg/m.    Physical Exam Vitals reviewed.  Constitutional:      Appearance: He is well-developed.     Comments: Pleasant very talkative male.  HENT:     Head: Normocephalic and atraumatic.     Right Ear: Tympanic membrane, ear canal and external ear normal.     Left Ear: Tympanic membrane, ear canal and external ear normal.     Nose: No nasal deformity, septal deviation, mucosal edema or rhinorrhea.     Right Turbinates: Enlarged, swollen and pale.     Left Turbinates: Enlarged, swollen and pale.     Right Sinus: No maxillary sinus tenderness or frontal sinus tenderness.     Left Sinus: No maxillary sinus tenderness or frontal sinus tenderness.     Comments: No nasal polyps.    Mouth/Throat:     Lips: Pink.     Mouth: Mucous membranes are moist. Mucous membranes are not pale and not dry. Oral lesions present.     Pharynx: Uvula midline.     Comments: He does have some white linear lesions on the bilateral sides of his tongue. Eyes:     General: Lids are normal. No allergic shiner.       Right eye: No discharge.        Left eye: No discharge.     Conjunctiva/sclera: Conjunctivae normal.     Right eye: Right conjunctiva is not injected. No chemosis.    Left eye: Left conjunctiva is not injected. No chemosis.    Pupils: Pupils are equal, round, and reactive to light.  Cardiovascular:     Rate and Rhythm: Normal rate and regular rhythm.     Heart sounds: Normal heart sounds.  Pulmonary:     Effort: Pulmonary effort is normal. No tachypnea, accessory muscle usage or respiratory distress.     Breath sounds: Examination of the right-upper field reveals rhonchi. Examination of the  left-upper field reveals rhonchi. Examination of the right-middle field reveals rhonchi. Examination of the left-middle field reveals rhonchi. Examination of the right-lower field reveals rhonchi. Examination of the left-lower field reveals rhonchi. Rhonchi present. No wheezing or rales.     Comments: Coarse upper airway sounds throughout. Decreased air movement at the bases. No wheezing  appreciated.  Chest:     Chest wall: No tenderness.  Lymphadenopathy:     Cervical: No cervical adenopathy.  Skin:    Coloration: Skin is not pale.     Findings: No abrasion, erythema, petechiae or rash. Rash is not papular, urticarial or vesicular.  Neurological:     Mental Status: He is alert.  Psychiatric:        Behavior: Behavior is cooperative.      Diagnostic studies:    Spirometry: results normal (FEV1: 3.05/75%, FVC: 3.86/75%, FEV1/FVC: 79%).    Spirometry consistent with moderate obstructive disease. Albuterol four puffs via MDI treatment given in clinic with no improvement.  Allergy Studies: none       Malachi Bonds, MD  [facility]

## 2023-04-07 NOTE — Telephone Encounter (Signed)
Noted. Dm/cma  

## 2023-04-07 NOTE — Patient Instructions (Addendum)
1. Moderate persistent asthma, uncomplicated - Lung testing looked much lower today.  - We are starting the doxycycline which can help with inflammation in your lungs as well.  - Let's start you on budesonide + Xopenex neb solution every 6 hours for the next week and then as needed - This will help [place] avoid more prednisone.  - Daily controller medication(s): Trelegy 200/62.5/25 one puff once daily + - Prior to physical activity: Xopenex 2 puffs 10-15 minutes before physical activity. - Rescue medications: Xopenex 4 puffs every 4-6 hours as needed OR budesonide + Xopenex neb solution every 6 hours - Asthma control goals:  * Full participation in all desired activities (may need albuterol before activity) * Albuterol use two time or less a week on average (not counting use with activity) * Cough interfering with sleep two time or less a month * Oral steroids no more than once a year * No hospitalizations  2. Chronic rhinitis (dust mites, cat, dog, grasses, molds, trees, and ragweed) - Continue with the Claritin (loratadine) 10mg  daily.  - Continue with Allegra (fexofenadine) 10mg  daily.  - Continue with Allercort one spray per nostril daily. - Continue with Astelin one spray per nostril daily. - CPT codes provided for allergy shots.  - This is a curative process and can help with the nasal congestion.   3. Acute sinusitis - Start doxycycline 100mg  twice daily for ten days. - This also has an anti-inflammatory effect and can help with your asthma.   4. 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  - We will continue to monitor.   - Consider Xolair to help   4. Oral lesions - Referral placed for ENT. - Hopefully they will call you in one week or so.   5. Return in about 2 months (around 06/07/2023).    Please inform us of any Emergency Department visits, hospitalizations, or changes in symptoms. Call us before going to the ED for breathing or allergy symptoms since  we might be able to fit you in for a sick visit. Feel free to contact us anytime with any questions, problems, or concerns.  It was a pleasure to see you again today!   Websites that have reliable patient information: 1. American Academy of Asthma, Allergy, and Immunology: www.aaaai.org 2. Food Allergy Research and Education (FARE): foodallergy.org 3. Mothers of Asthmatics: http://www.asthmacommunitynetwork.org 4. American College of Allergy, Asthma, and Immunology: www.acaai.org   COVID-19 Vaccine Information can be found at: [email or web address] For questions related to vaccine distribution or appointments, please email [email or web address] or call [phone].   We realize that you might be concerned about having an allergic reaction to the COVID19 vaccines. To help with that concern, WE ARE OFFERING THE COVID19 VACCINES IN OUR OFFICE! Ask the front desk for dates!     "Like" [place] on Facebook and Instagram for our latest updates!      A healthy democracy works best when [employer] participate! Make sure you are registered to vote! If you have moved or changed any of your contact information, you will need to get this updated before voting!  In some cases, you MAY be able to register to vote online: [email or web address]     Allergy Shots   Allergies are the result of a chain reaction that starts in the immune system. Your immune system controls how your body defends itself. For instance, if you have an allergy to pollen, your immune system identifies pollen as an invader or allergen. Your immune system  overreacts by producing antibodies called Immunoglobulin E (IgE). These antibodies travel to cells that release chemicals, causing an allergic reaction.  The concept behind allergy immunotherapy, whether it is received in the form of shots or tablets, is that the immune system can be desensitized to  specific allergens that trigger allergy symptoms. Although it requires time and patience, the payback can be long-term relief.  How Do Allergy Shots Work?  Allergy shots work much like a vaccine. Your body responds to injected amounts of a particular allergen given in increasing doses, eventually developing a resistance and tolerance to it. Allergy shots can lead to decreased, minimal or no allergy symptoms.  There generally are two phases: build-up and maintenance. Build-up often ranges from three to six months and involves receiving injections with increasing amounts of the allergens. The shots are typically given once or twice a week, though more rapid build-up schedules are sometimes used.  The maintenance phase begins when the most effective dose is reached. This dose is different for each person, depending on how allergic you are and your response to the build-up injections. Once the maintenance dose is reached, there are longer periods between injections, typically two to four weeks.  Occasionally doctors give cortisone-type shots that can temporarily reduce allergy symptoms. These types of shots are different and should not be confused with allergy immunotherapy shots.  Who Can Be Treated with Allergy Shots?  Allergy shots may be a good treatment approach for people with allergic rhinitis (hay fever), allergic asthma, conjunctivitis (eye allergy) or stinging insect allergy.   Before deciding to begin allergy shots, you should consider:   The length of allergy season and the severity of your symptoms  Whether medications and/or changes to your environment can control your symptoms  Your desire to avoid long-term medication use  Time: allergy immunotherapy requires a major time commitment  Cost: may vary depending on your insurance coverage  Allergy shots for children age 45 and older are effective and often well tolerated. They might prevent the onset of new allergen sensitivities or  the progression to asthma.  Allergy shots are not started on patients who are pregnant but can be continued on patients who become pregnant while receiving them. In some patients with other medical conditions or who take certain common medications, allergy shots may be of risk. It is important to mention other medications you talk to your allergist.   When Will I Feel Better?  Some may experience decreased allergy symptoms during the build-up phase. For others, it may take as long as 12 months on the maintenance dose. If there is no improvement after a year of maintenance, your allergist will discuss other treatment options with you.  If you aren't responding to allergy shots, it may be because there is not enough dose of the allergen in your vaccine or there are missing allergens that were not identified during your allergy testing. Other reasons could be that there are high levels of the allergen in your environment or major exposure to non-allergic triggers like tobacco smoke.  What Is the Length of Treatment?  Once the maintenance dose is reached, allergy shots are generally continued for three to five years. The decision to stop should be discussed with your allergist at that time. Some people may experience a permanent reduction of allergy symptoms. Others may relapse and a longer course of allergy shots can be considered.  What Are the Possible Reactions?  The two types of adverse reactions that can occur with  allergy shots are local and systemic. Common local reactions include very mild redness and swelling at the injection site, which can happen immediately or several hours after. A systemic reaction, which is less common, affects the entire body or a particular body system. They are usually mild and typically respond quickly to medications. Signs include increased allergy symptoms such as sneezing, a stuffy nose or hives.  Rarely, a serious systemic reaction called anaphylaxis can  develop. Symptoms include swelling in the throat, wheezing, a feeling of tightness in the chest, nausea or dizziness. Most serious systemic reactions develop within 30 minutes of allergy shots. This is why it is strongly recommended you wait in your doctor's office for 30 minutes after your injections. Your allergist is trained to watch for reactions, and his or her staff is trained and equipped with the proper medications to identify and treat them.  Who Should Administer Allergy Shots?  The preferred location for receiving shots is your prescribing allergist's office. Injections can sometimes be given at another facility where the physician and staff are trained to recognize and treat reactions, and have received instructions by your prescribing allergist.

## 2023-04-08 ENCOUNTER — Encounter: Payer: Self-pay | Admitting: Allergy & Immunology

## 2023-04-08 ENCOUNTER — Telehealth: Payer: Self-pay | Admitting: Allergy & Immunology

## 2023-04-08 DIAGNOSIS — R058 Other specified cough: Secondary | ICD-10-CM

## 2023-04-08 MED ORDER — VENTOLIN HFA 108 (90 BASE) MCG/ACT IN AERS: INHALATION_SPRAY | RESPIRATORY_TRACT | 1 refills | Status: DC

## 2023-04-08 MED ORDER — BUDESONIDE 0.5 MG/2ML IN SUSP: RESPIRATORY_TRACT | 2 refills | Status: AC

## 2023-04-08 MED ORDER — DOXYCYCLINE HYCLATE 100 MG PO CAPS: 100.0000 mg | ORAL_CAPSULE | Freq: Two times a day (BID) | ORAL | 0 refills | Status: AC

## 2023-04-08 MED ORDER — LEVALBUTEROL HCL 1.25 MG/3ML IN NEBU: 1.2500 mg | INHALATION_SOLUTION | RESPIRATORY_TRACT | 1 refills | Status: AC | PRN

## 2023-04-08 NOTE — Telephone Encounter (Signed)
Budesonide and albuterol nebulizer medications have been sent to pharmacy on file. However, they both stated Prior Berkley Harvey is needed in Epic. Budesonide is flagged as the patient being allergic to possible ingredient. Please advise if this medication needs to be changed to something else.

## 2023-04-08 NOTE — Telephone Encounter (Signed)
Addressed in My Chart message

## 2023-04-08 NOTE — Addendum Note (Signed)
Addended by: Dub Mikes on: 04/08/2023 03:25 PM   Modules accepted: Orders

## 2023-04-08 NOTE — Telephone Encounter (Signed)
Patient states only the epi pen was called in to the pharmacy. He still needs doxycycline and xopenex.

## 2023-04-08 NOTE — Telephone Encounter (Signed)
Prescriptions sent to pharmacy

## 2023-04-08 NOTE — Telephone Encounter (Signed)
Patient came into the [place] office upset as to why his Xopenex still was not sent in. Patient was informed that per his after visit summary that Dr. Dellis Anes stated for Rescue medications: Ventolin 4 puffs every 4-6 hours as needed or budesonide + albuterol neb solution every 6 hours. Patient stated that is not what he told him. He stated that Dr. Dellis Anes told him Xopenex. I spoke with Dr. Dellis Anes and he stated that it was fine to send in Xopenex. Patient would like to hear back from Dr. Dellis Anes via MyChart to be 100% sure that is what he said. He stated that after 2 strokes he wants to be sure he heard what he believes her heard. Patient also wanted to inform me that he felt rushed out of his appointment by the CMA. He stated she couldn't answer his questions and needed to get Dr. Dellis Anes back in the room to ask him. I did apologize for the medications not being sent in at the time of his visit as well as the miscommunication regarding the Xopenex. I also informed him I would speak with the CMA regarding his concern with being rushed out. He verbalized understanding and would like to hear back from Dr. Dellis Anes.

## 2023-04-08 NOTE — Telephone Encounter (Signed)
Patient informed. 

## 2023-04-10 ENCOUNTER — Encounter: Payer: Self-pay | Admitting: Allergy & Immunology

## 2023-04-11 ENCOUNTER — Telehealth: Payer: Self-pay

## 2023-04-11 NOTE — Telephone Encounter (Signed)
Called patient's insurance - [phone] - DOB verified - per recording, no PA needed for CT Chest High Resolution, CPT: 71250 Dx: R05.8  Called [facility]/([phone]) [phone] - spoke to Pansy - DOB verified - advised of the above notation.  Scheduled CT Chest High Resolution for:  Site: [facility]  Address: [address] [place] Date: 04/25/23 Time: 12:40 pm Arrival: 12:20 pm  Called patient - DOB verified - advised of above notation. Patient stated he needed to change time - gave [facility] contact information - [phone] to call and reschedule.  Patient verbalized understanding, no further questions.

## 2023-04-11 NOTE — Addendum Note (Signed)
Addended by: Alfonse Spruce on: 04/11/2023 01:29 PM   Modules accepted: Orders

## 2023-04-12 ENCOUNTER — Telehealth: Payer: Self-pay | Admitting: Hematology

## 2023-04-12 NOTE — Telephone Encounter (Signed)
Patient stated that original appointment time did not accommodate his schedule and need it to be rescheduled. Patient is aware of rescheduled appointment times/dates.

## 2023-04-13 ENCOUNTER — Other Ambulatory Visit: Payer: Self-pay | Admitting: Nurse Practitioner

## 2023-04-18 DIAGNOSIS — C73 Malignant neoplasm of thyroid gland: Secondary | ICD-10-CM | POA: Diagnosis not present

## 2023-04-18 DIAGNOSIS — E890 Postprocedural hypothyroidism: Secondary | ICD-10-CM | POA: Diagnosis not present

## 2023-04-18 DIAGNOSIS — D751 Secondary polycythemia: Secondary | ICD-10-CM | POA: Diagnosis not present

## 2023-04-19 ENCOUNTER — Ambulatory Visit: Payer: 59 | Admitting: Allergy & Immunology

## 2023-04-21 ENCOUNTER — Ambulatory Visit: Payer: 59

## 2023-04-21 ENCOUNTER — Other Ambulatory Visit: Payer: 59

## 2023-04-22 ENCOUNTER — Other Ambulatory Visit: Payer: 59

## 2023-04-22 ENCOUNTER — Inpatient Hospital Stay: Payer: 59 | Admitting: Hematology

## 2023-04-22 ENCOUNTER — Inpatient Hospital Stay: Payer: 59 | Attending: Hematology

## 2023-04-22 ENCOUNTER — Ambulatory Visit
Admission: RE | Admit: 2023-04-22 | Discharge: 2023-04-22 | Disposition: A | Discharge status: Home / Self Care | Payer: 59 | Source: Ambulatory Visit | Attending: Allergy & Immunology | Admitting: Allergy & Immunology

## 2023-04-22 ENCOUNTER — Encounter: Payer: Self-pay | Admitting: Hematology

## 2023-04-22 ENCOUNTER — Other Ambulatory Visit: Payer: Self-pay

## 2023-04-22 VITALS — BP 142/66 | HR 85 | Temp 98.0°F | Resp 16

## 2023-04-22 DIAGNOSIS — D751 Secondary polycythemia: Secondary | ICD-10-CM | POA: Diagnosis not present

## 2023-04-22 DIAGNOSIS — Z79890 Hormone replacement therapy (postmenopausal): Secondary | ICD-10-CM | POA: Insufficient documentation

## 2023-04-22 DIAGNOSIS — R058 Other specified cough: Secondary | ICD-10-CM

## 2023-04-22 DIAGNOSIS — Z85850 Personal history of malignant neoplasm of thyroid: Secondary | ICD-10-CM | POA: Diagnosis not present

## 2023-04-22 DIAGNOSIS — R053 Chronic cough: Secondary | ICD-10-CM | POA: Diagnosis not present

## 2023-04-22 MED ORDER — SODIUM CHLORIDE 0.9 % IV SOLN: Freq: Once | INTRAVENOUS | Status: AC

## 2023-04-22 NOTE — Progress Notes (Signed)
Per Dr. Mosetta Putt, ok to proceed with phlebotomy with CBC from 6/25. Troy [name] presents today for phlebotomy per MD orders. Phlebotomy procedure started at 1457 and ended at 1509 using 18g IV needle. 531 grams removed. Patient given NS over 1 hour post phlebotomy.  Patient tolerated procedure well, vitals stable and patient in no distress. IV needle removed intact.

## 2023-04-22 NOTE — Patient Instructions (Signed)

## 2023-04-22 NOTE — Progress Notes (Signed)
[facility]   [phone] [phone]   Clinic Follow up Note   Patient Care Team: Loyola Mast, MD as PCP - General (Family Medicine) Hassell Done, MD as Consulting Physician (Endocrinology) Jethro Bolus, MD (Inactive) as Consulting Physician (Urology) Dellis Anes Hetty Ely, MD as Consulting Physician (Allergy and Immunology) Jeanie Sewer, Talmadge Coventry, MD as Referring Physician (Dermatology) Malachy Mood, MD as Attending Physician (Hematology and Oncology) Christell Constant, MD as Consulting Physician (Cardiology)  Date of Service:  04/22/2023  CHIEF COMPLAINT: f/u of Erythrocytosis, likely secondary polycythemia   CURRENT THERAPY:  Therapeutic Phlebotomy and IV Fluids& Antiemetics  ASSESSMENT:  Troy [name] is a 47 y.o. 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  He has been taking testosterone replacement consistently in the past 2 years.  This is likely related to testosterone replacement.  I encouraged him to check his testosterone level and use minimal dose testosterone under his guidance of his endocrinologist. -We discussed other secondary polycythemia, including COPD, sleep apnea, smoking.  He does have interstitial lung disease due to previous radiation, does not smoke.  He states that he does not have sleep apnea. 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 -Due to the weekly testosterone injection, if his H/H gets too high, he is at risk for thrombosis including stroke. -Patient is not able to donate blood due to previous history of lymphoma -Will set up lab and phlebotomy if hematocrit above 50%  -We discussed risk of iron  deficiency from frequent phlebotomy     PLAN: -Will schedule lab and phlebotomy if hematocrit above 50% every 4 months -Follow-up in a months -Will monitor his iron level    INTERVAL HISTORY:  Troy [name] is here for a follow up of Erythrocytosis, likely secondary polycythemia. He was last seen by me on 11/26/2022. He presents to the clinic alone. Pt state that  when he gets cut  the blood would  be dark in color. Pt take testosterone replacement weekly.   All other systems were reviewed with the patient and are negative.  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   Chronic fatigue    Drug abuse (HCC)    Family history of breast cancer 05/19/2021   Family history of colon cancer in mother    GERD (gastroesophageal reflux disease)    +Barretts   Hematochezia    hemorrhoids   Hepatic steatosis    +hepatomegaly   History of CVA (cerebrovascular accident)    radiation-induced carotid damage/changes   History of hepatitis C    +treated   History of Hodgkin's lymphoma    age 69->mantle RT + splenectomy   History of splenectomy    Hyperlipidemia    Hypertension    Insomnia    Male hypogonadism    Moderate persistent asthma    Obesity, Class I, BMI 30-34.9    Papillary thyroid carcinoma (HCC) 2010   total thyroidectomy + neck dissection   Postsurgical hypothyroidism    Prediabetes    Pulmonary fibrosis (HCC)    ?rad induced?    SURGICAL HISTORY: Past  Surgical History:  Procedure Laterality Date   COLONOSCOPY  07/04/2019   done for hematochezia and FH col ca-mother: Mod int hem w/inflamm.  No adenomatous polyps.   INCISIONAL HERNIA REPAIR  06/2019   RUQ (prev lap append site)   LAPAROSCOPIC APPENDECTOMY N/A 03/25/2017   Procedure: APPENDECTOMY LAPAROSCOPIC; EXTENSIVE LYSIS OF ADHESIONS FROM PRIOR RADIATION AND SURGERY;  Surgeon: Luretha Murphy, MD;  Location: [facility];  Service:  General;  Laterality: N/A;   PARATHYROIDECTOMY     SPLENECTOMY     TEE WITHOUT CARDIOVERSION N/A 07/18/2015   Procedure: TRANSESOPHAGEAL ECHOCARDIOGRAM (TEE);  Surgeon: [name], MD;  Location: [facility] END[facility]COPY;  Service: Cardiovascular;  Laterality: N/A;   THYROIDECTOMY      I have reviewed the social history and family history with the patient and they are unchanged from previous note.  ALLERGIES:  is allergic to ciprofloxacin, sage [salvia officinalis], and fluocinolone.  MEDICATIONS:  Current Outpatient Medications  Medication Sig Dispense Refill   albuterol (PROVENTIL) (5 MG/ML) 0.5% nebulizer solution      Albuterol-Budesonide 90-80 MCG/ACT AERO Inhale 2 Inhalations into the lungs every 4 (four) hours as needed. Maximum 12 inhalations per day. (Patient not taking: Reported on 04/07/2023) 10.7 g 2   ammonium lactate (LAC-HYDRIN) 12 % lotion Apply 1 application topically as needed for dry skin.     aspirin 325 MG tablet Take 1 tablet (325 mg total) by mouth daily. 30 tablet 0   azelastine (ASTELIN) 0.1 % nasal spray Place 1 spray into both nostrils daily. Use in each nostril as directed 30 mL 0   baclofen (LIORESAL) 10 MG tablet Take 1 tablet by mouth three times daily as needed 30 tablet 5   bisoprolol (ZEBETA) 5 MG tablet Take 0.5 tablets (2.5 mg total) by mouth at bedtime. 45 tablet 3   budesonide (PULMICORT) 0.5 MG/2ML nebulizer solution Mix with Xopenex nebulizer solution every 6 hours as needed. 120 mL 2   clindamycin (CLEOCIN T) 1 % lotion Apply topically as needed (rash).     EPINEPHrine 0.3 mg/0.3 mL IJ SOAJ injection Inject 0.3 mg into the muscle as needed for anaphylaxis. 2 each 2   esomeprazole (NEXIUM) 40 MG capsule TAKE 1 CAPSULE BY MOUTH TWICE DAILY BEFORE A MEAL 180 capsule 3   ezetimibe (ZETIA) 10 MG tablet Take 1 tablet (10 mg total) by mouth daily. 90 tablet 3   fluticasone (FLONASE) 50 MCG/ACT nasal spray Use 1 spray(s) in each nostril once daily (Patient not  taking: Reported on 04/07/2023) 16 g 11   Fluticasone-Umeclidin-Vilant (TRELEGY ELLIPTA) 200-62.5-25 MCG/ACT AEPB INHALE 1 PUFF INTO LUNGS ONCE DAILY 60 each 2   furosemide (LASIX) 20 MG tablet Take 1 tablet (20 mg total) by mouth 3 (three) times a week. 36 tablet 3   gabapentin (NEURONTIN) 300 MG capsule TAKE 4 CAPSULES BY MOUTH IN THE MORNING AND 2 CAPSULES AT NOON AND 2 CAPSULES AT BEDTIME (Patient taking differently: TAKE 2 Casp am, 2 caps noon, 4 caps bedtime) 240 capsule 5   hydrOXYzine (VISTARIL) 50 MG capsule Take 1 capsule (50 mg total) by mouth at bedtime. (Patient not taking: Reported on 04/07/2023) 90 capsule 3   levalbuterol (XOPENEX) 1.25 MG/3ML nebulizer solution Take 1.25 mg by nebulization every 4 (four) hours as needed for wheezing. 120 mL 1   levothyroxine (SYNTHROID) 300 MCG tablet Take 300 mcg by mouth daily before breakfast.     lisinopril (ZESTRIL) 40 MG tablet Take 1 tablet by mouth once daily 90 tablet  3   LUER LOCK SAFETY SYRINGES 22G X 1-1/2" 3 ML MISC USE FOR SELF ADMINISTER OF TESTOSTERONE 2CC ONCE EVERY 2 WEEKS     Multiple Vitamin (MULTI-VITAMIN) tablet Take 1 tablet by mouth daily.     rosuvastatin (CRESTOR) 40 MG tablet Take 1 tablet by mouth once daily 90 tablet 3   Suvorexant (BELSOMRA) 10 MG TABS Take 1 tablet (10 mg total) by mouth at bedtime. 30 tablet 2   tamsulosin (FLOMAX) 0.4 MG CAPS capsule Take 1 capsule by mouth once daily 30 capsule 11   testosterone cypionate (DEPOTESTOSTERONE CYPIONATE) 200 MG/ML injection Inject 0.6 mLs (120 mg total) into the muscle once a week. 10 mL 0   traZODone (DESYREL) 150 MG tablet TAKE 2 TABLETS BY MOUTH AT BEDTIME 180 tablet 3   VENTOLIN HFA 108 (90 Base) MCG/ACT inhaler INHALE 2 PUFFS BY MOUTH EVERY 6 HOURS AS NEEDED FOR SHORTNESS OF BREATH 18 g 1   No current facility-administered medications for this visit.    PHYSICAL EXAMINATION: ECOG PERFORMANCE STATUS: 0 - Asymptomatic  There were no vitals filed for this  visit. Wt Readings from Last 3 Encounters:  04/07/23 215 lb 11.2 oz (97.8 kg)  04/05/23 214 lb 9.6 oz (97.3 kg)  03/29/23 216 lb (98 kg)     GENERAL:alert, no distress and comfortable SKIN: skin color normal, no rashes or significant lesions EYES: normal, Conjunctiva are pink and non-injected, sclera clear  NEURO: alert & oriented x 3 with fluent speech  LABORATORY DATA:  I have reviewed the data as listed    Latest Ref Rng & Units 04/05/2023    3:36 PM 02/11/2023    4:25 PM 11/16/2022   11:44 AM  CBC  WBC 4.0 - 10.5 K/uL 8.6  11.1  11.7   Hemoglobin 13.0 - 17.0 g/dL 16.1 Repeated and verified X2.  17.4  17.5   Hematocrit 39.0 - 52.0 % 56.4  51.6  49.6   Platelets 150.0 - 400.0 K/uL 226.0  202  216         Latest Ref Rng & Units 01/14/2023   11:29 AM 08/05/2022    5:41 PM 08/05/2022    5:21 PM  CMP  Glucose 70 - 99 mg/dL 86  91  93   BUN 6 - 24 mg/dL 23  32  33   Creatinine 0.76 - 1.27 mg/dL 0.96  0.45  4.09   Sodium 134 - 144 mmol/L 138  138  136   Potassium 3.5 - 5.2 mmol/L 5.1  4.2  4.5   Chloride 96 - 106 mmol/L 104  107  104   CO2 20 - 29 mmol/L 19   19   Calcium 8.7 - 10.2 mg/dL 9.3   9.7   Total Protein 6.5 - 8.1 g/dL   7.7   Total Bilirubin 0.3 - 1.2 mg/dL   0.3   Alkaline Phos 38 - 126 U/L   73   AST 15 - 41 U/L   62   ALT 0 - 44 U/L   80       RADIOGRAPHIC STUDIES: I have personally reviewed the radiological images as listed and agreed with the findings in the report. No results found.    No orders of the defined types were placed in this encounter.  All questions were answered. The patient knows to call the clinic with any problems, questions or concerns. No barriers to learning was detected. The total time spent in the appointment was 15 minutes.  Malachy Mood, MD 04/22/2023   Carolin Coy, CMA, am acting as scribe for Malachy Mood, MD.   I have reviewed the above documentation for accuracy and completeness, and I agree with the above.

## 2023-04-23 ENCOUNTER — Other Ambulatory Visit: Payer: Self-pay | Admitting: Family Medicine

## 2023-04-23 DIAGNOSIS — F5104 Psychophysiologic insomnia: Secondary | ICD-10-CM

## 2023-04-25 ENCOUNTER — Other Ambulatory Visit: Payer: 59

## 2023-04-25 NOTE — Telephone Encounter (Signed)
Refill request for Belsomra 10 mg LR 01/17/23, #30, 2 rf LOV 04/05/23 FOV  none scheduled.    Please review and advise.   Thanks. Dm/cma

## 2023-04-26 NOTE — Progress Notes (Signed)
Cardiology Office Note:  .   Date:  05/10/2023  ID:  Troy [name], DOB [birth year], MRN [id number] PCP: Loyola Mast, MD  [facility] Providers Cardiologist:  Christell Constant, MD    History of Present Illness: .   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  Prior history of embolic strokes.Patient is a Camera operator, former marathon runner. Asthma was worse on TRT. Has had issues with DOE. 2024: Seen by Dr. 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  Now followed by Dr. Izora Ribas and Had 04/22/23 CT Chest- spotty LAD calcification on CT incidentally he had offered him PCSK9 inhibitors and bempedoic acid and he was doing his own research.  He was very worried about continuity of care (see all of my chart messages).   Patient called in 04/27/23 with persistent chest(sharp/stabbing with deep breath) and left arm pain at night and also severe cough with asthma flare.If not sounds ischemic in person, CCTA vs LHC would be reasonable.   Patient comes in for f/u. He hasn't noticed chest pain in awhile since his lung infection/cough have resolved. He is agreeable to repatha and has an appt with pharmacy. Occasional ankle edema when he stands all day. Drinks 500 mg sodium on weight lifting days. Not really taking lasix.  ROS:    Studies Reviewed: Marland Kitchen         Prior CV Studies: ECHO COMPLETE WO IMAGING ENHANCING AGENT 11/10/2022  Narrative ECHOCARDIOGRAM REPORT    Patient Name:   Troy [name] Biglow Date of Exam: 11/10/2022 Medical Rec #:  [id number]       Height:       72.0 in Accession #:    [id number]      Weight:       215.0 lb Date of Birth:  Nov 06, 1975        BSA:          2.197 m Patient Age:    46 years        BP:           136/88 mmHg Patient Gender: M               HR:            108 bpm. Exam Location:  [address]  Procedure: 2D Echo, Cardiac Doppler, Color Doppler, 3D Echo and Strain Analysis  Indications:    R00.2 Palpitations  History:        Patient has prior history of Echocardiogram examinations, most recent 07/17/2015. Palpitations, Stroke; Risk Factors:Hypertension, Dyslipidemia and Former Smoker.  Sonographer:    Samule Ohm RDCS Referring Phys: [id number] PAULA V ROSS  IMPRESSIONS   1. Left ventricular ejection fraction by 3D volume is 45 %. The left ventricle has mildly decreased function. The left ventricle demonstrates global hypokinesis. The left ventricular internal cavity size was mildly dilated. There is mild left ventricular hypertrophy. Left ventricular diastolic parameters are consistent with Grade I diastolic dysfunction (impaired relaxation). The average left ventricular global longitudinal strain is -13.6 %. The global longitudinal strain is abnormal. 2. Right ventricular systolic function is normal. The right ventricular size is normal. There is normal pulmonary artery systolic pressure. The estimated right ventricular systolic pressure is 28.0 mmHg. 3. The mitral valve is normal in structure. No evidence  of mitral valve regurgitation. No evidence of mitral stenosis. 4. The aortic valve is normal in structure. Aortic valve regurgitation is mild. No aortic stenosis is present. 5. The inferior vena cava is normal in size with greater than 50% respiratory variability, suggesting right atrial pressure of 3 mmHg.  FINDINGS Left Ventricle: Left ventricular ejection fraction by 3D volume is 45 %. The left ventricle has mildly decreased function. The left ventricle demonstrates global hypokinesis. The average left ventricular global longitudinal strain is -13.6 %. The global longitudinal strain is abnormal. The left ventricular internal cavity size was mildly dilated. There is mild left ventricular hypertrophy. Left ventricular diastolic parameters  are consistent with Grade I diastolic dysfunction (impaired relaxation).  Right Ventricle: The right ventricular size is normal. No increase in right ventricular wall thickness. Right ventricular systolic function is normal. There is normal pulmonary artery systolic pressure. The tricuspid regurgitant velocity is 2.50 m/s, and with an assumed right atrial pressure of 3 mmHg, the estimated right ventricular systolic pressure is 28.0 mmHg.  Left Atrium: Left atrial size was normal in size.  Right Atrium: Right atrial size was normal in size.  Pericardium: There is no evidence of pericardial effusion.  Mitral Valve: The mitral valve is normal in structure. No evidence of mitral valve regurgitation. No evidence of mitral valve stenosis.  Tricuspid Valve: The tricuspid valve is normal in structure. Tricuspid valve regurgitation is mild . No evidence of tricuspid stenosis.  Aortic Valve: The aortic valve is normal in structure. Aortic valve regurgitation is mild. No aortic stenosis is present.  Pulmonic Valve: The pulmonic valve was normal in structure. Pulmonic valve regurgitation is not visualized. No evidence of pulmonic stenosis.  Aorta: The aortic root is normal in size and structure.  Venous: The inferior vena cava is normal in size with greater than 50% respiratory variability, suggesting right atrial pressure of 3 mmHg.  IAS/Shunts: No atrial level shunt detected by color flow Doppler.   LEFT VENTRICLE PLAX 2D LVIDd:         5.40 cm         Diastology LVIDs:         4.10 cm         LV e' medial:    9.14 cm/s LV PW:         1.30 cm         LV E/e' medial:  10.0 LV IVS:        1.00 cm         LV e' lateral:   14.80 cm/s LVOT diam:     2.40 cm         LV E/e' lateral: 6.2 LVOT Area:     4.52 cm 2D Longitudinal Strain 2D Strain GLS  -13.6 % Avg:  3D Volume EF LV 3D EF:    Left ventricul ar ejection fraction by 3D volume is 45 %.  3D Volume EF: 3D EF:        45 % LV  EDV:       238 ml LV ESV:       130 ml LV SV:        108 ml  RIGHT VENTRICLE             IVC RV S prime:     14.60 cm/s  IVC diam: 1.50 cm TAPSE (M-mode): 1.4 cm RVSP:           28.0 mmHg  LEFT ATRIUM  Index        RIGHT ATRIUM           Index LA diam:        3.80 cm 1.73 cm/m   RA Pressure: 3.00 mmHg LA Vol (A2C):   48.0 ml 21.85 ml/m  RA Area:     14.80 cm LA Vol (A4C):   60.1 ml 27.35 ml/m  RA Volume:   35.50 ml  16.16 ml/m LA Biplane Vol: 55.1 ml 25.08 ml/m  AORTA Ao Root diam: 3.00 cm Ao Asc diam:  3.20 cm  MITRAL VALVE                TRICUSPID VALVE MV Area (PHT): 2.47 cm     TR Peak grad:   25.0 mmHg MV Decel Time: 307 msec     TR Vmax:        250.00 cm/s MV E velocity: 91.20 cm/s   Estimated RAP:  3.00 mmHg MV A velocity: 128.00 cm/s  RVSP:           28.0 mmHg MV E/A ratio:  0.71 SHUNTS Systemic Diam: 2.40 cm  Donato Schultz MD Electronically signed by Donato Schultz MD Signature Date/Time: 11/11/2022/7:12:41 AM    Final   LONG TERM MONITOR (8-14 DAYS) HOOK UP AND INTERPRETATION 11/02/2022  Narrative Patch Wear Time:  9 days and 4 hours (2024-01-08T15:23:01-499 to 2024-01-17T20:17:28-0500)  Patient had a min HR of 59 bpm, max HR of 172 bpm, and avg HR of 95 bpm. Predominant underlying rhythm was Sinus Rhythm. Isolated SVEs were rare (<1.0%), SVE Couplets were rare (<1.0%), and SVE Triplets were rare (<1.0%). Isolated VEs were rare (<1.0%), and no VE Couplets or VE Triplets were present.  No triggered events       Risk Assessment/Calculations:             Physical Exam:   VS:  BP 126/74   Pulse (!) 101   Ht 6' (1.829 m)   Wt 219 lb 6.4 oz (99.5 kg)   SpO2 95%   BMI 29.76 kg/m    Wt Readings from Last 3 Encounters:  05/10/23 219 lb 6.4 oz (99.5 kg)  04/07/23 215 lb 11.2 oz (97.8 kg)  04/05/23 214 lb 9.6 oz (97.3 kg)    GEN: Well nourished, well developed in no acute distress NECK: No JVD; No carotid bruits CARDIAC:  RRR, no murmurs,  rubs, gallops RESPIRATORY:  Clear to auscultation without rales, wheezing or rhonchi  ABDOMEN: Soft, non-tender, non-distended EXTREMITIES:  No edema; No deformity   ASSESSMENT AND PLAN: .    History of chest pain and LAD coronary calcification noted on CT. PCSK9 inhibitors recommended and referral to lipid clinic placed, chest pain resolved with treatment of lung infection. No further w/u indicated.  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 Izora Ribas: - based on the TRANSVERSE Trial from NEJM 2023 - Testosterone  therapy in middle-aged and older men with hypogonadism compared to placebo does not increase major cardiovascular events. - The incidence of pulmonary embolism, nonfatal arrhythmia, atrial fibrillation, and acute injury was higher in subjects who received testosterone replacement therapy. - we will set an LDL goal of < 55 and BP goal < 130/80          Dispo: F/u Dr. Salena Saner 6 months  Signed, Jacolyn Reedy, PA-C

## 2023-04-27 ENCOUNTER — Encounter: Payer: Self-pay | Admitting: Internal Medicine

## 2023-04-27 NOTE — Telephone Encounter (Signed)
Spoke with the patient about his chest pain. He states that he does not think it is a musculoskeletal issue. He cannot reproduce the pain. He states he has had this pain in the past. He states that he does eat soon before lying down before bed however it does not feel like reflux. He has a history of GERD and is currently taking omeprazole. He states that he does not need to be seen prior to his scheduled appointment on 7/30. He just wanted to make [place] aware so it could be discussed at his visit. Patient aware of ER precautions.

## 2023-05-10 ENCOUNTER — Telehealth: Payer: Self-pay | Admitting: Hematology

## 2023-05-10 ENCOUNTER — Ambulatory Visit: Payer: 59 | Attending: Physician Assistant | Admitting: Physician Assistant

## 2023-05-10 ENCOUNTER — Other Ambulatory Visit: Payer: Self-pay | Admitting: Allergy & Immunology

## 2023-05-10 ENCOUNTER — Encounter: Payer: Self-pay | Admitting: Physician Assistant

## 2023-05-10 VITALS — BP 126/74 | HR 101 | Ht 72.0 in | Wt 219.4 lb

## 2023-05-10 DIAGNOSIS — Z8571 Personal history of Hodgkin lymphoma: Secondary | ICD-10-CM

## 2023-05-10 DIAGNOSIS — I2510 Atherosclerotic heart disease of native coronary artery without angina pectoris: Secondary | ICD-10-CM | POA: Diagnosis not present

## 2023-05-10 DIAGNOSIS — R6889 Other general symptoms and signs: Secondary | ICD-10-CM | POA: Diagnosis not present

## 2023-05-10 DIAGNOSIS — R079 Chest pain, unspecified: Secondary | ICD-10-CM

## 2023-05-10 DIAGNOSIS — I471 Supraventricular tachycardia, unspecified: Secondary | ICD-10-CM

## 2023-05-10 DIAGNOSIS — I5020 Unspecified systolic (congestive) heart failure: Secondary | ICD-10-CM | POA: Diagnosis not present

## 2023-05-10 NOTE — Patient Instructions (Signed)
Medication Instructions:  Your physician recommends that you continue on your current medications as directed. Please refer to the Current Medication list given to you today.  *If you need a refill on your cardiac medications before your next appointment, please call your pharmacy*   Lab Work: None ordered   If you have labs (blood work) drawn today and your tests are completely normal, you will receive your results only by: MyChart Message (if you have MyChart) OR A paper copy in the mail If you have any lab test that is abnormal or we need to change your treatment, we will call you to review the results.   Testing/Procedures: None ordered    Follow-Up: Follow up with the pharmacist as scheduled   Follow up with Dr. Izora Ribas in 6 months   Other Instructions

## 2023-05-10 NOTE — Telephone Encounter (Signed)
Patient is aware of upcoming appointment times/dates.  

## 2023-05-13 ENCOUNTER — Encounter: Payer: Self-pay | Admitting: Allergy & Immunology

## 2023-05-13 ENCOUNTER — Other Ambulatory Visit: Payer: Self-pay

## 2023-05-13 MED ORDER — ALBUTEROL SULFATE (2.5 MG/3ML) 0.083% IN NEBU: 2.5000 mg | INHALATION_SOLUTION | RESPIRATORY_TRACT | 1 refills | Status: DC | PRN

## 2023-05-19 NOTE — Progress Notes (Signed)
Patient ID: [name]                 DOB: 17-Jan-1976                    MRN: [id number]     HPI: Troy [name] is a 47 y.o. male patient of Troy [name] referred to lipid clinic by Troy Reedy, PA-C. PMH is significant for HFmrEF, CVA, Hodgkin's Lymphoma s/p high dose radiation, prior pulmonary fibrosis and asthma, tobacco abuse, current marijuana use, and HTN. Most recent echo 11/10/22 with LVEF 45%. 04/22/23 CT chest incidentally showed aortic atherosclerosis. Already on high intensity statin and ezetimibe so he was offered Repatha. Pt preferred to do his own research prior to initiating. Expressed concerns as he has been on other mabs for asthma which did not improve his symptoms and had side effects, but at appt on 7/30 with PA pt reported he was agreeable to Repatha. Pt has expressed concerns around continuity of care amongst his specialists and polypharmacy.   Pt presents to clinic today doing well. Presented with multiple medication related questions. Expressed concerns about polypharmacy and continuity of care amongst his providers. Reports adherence to rosuvastatin, ezetimibe, bisoprolol, and lisinopril. Notes that his HR does not increase as much when exercising since starting bisoprolol. Reports resting HR usually low 100s and BP 130s/80s. Has had a cough for many years and expressed concerns about lisinopril contributing. Expressed concerns about Repatha as a monoclonal antibody, but is ready to try it after doing his own research. Discussed HF GDMT options and he expressed concerns about genital infections with Jardiance/Farxiga, not ready to try one at this time, but plans to think about it. Only takes furosemide about twice a month when swelling worsens as he does not like the increase in urination. Of note, has Medicare and Medicaid.  Current Medications: rosuvastatin 40 mg daily, ezetimibe 10 mg daily Risk Factors: aortic atherosclerosis on chest CT, hx of premature ASCVD  (stroke) LDL goal: <55 mg/dl  Current CHF meds: bisoprolol 2.5 mg daily, lisinopril 40 mg daily, furosemide 20 mg three times a week (taking ~2x/month) Previously tried: metoprolol (fatigue) BP goal: <130/80  Diet: pt reports healthy diet  Exercise: power lifter, former marathon runner  Family History:  Mother- breast cancer, colon cancer Father- multiple myeloma, parkinsonism Maternal grandmother- stroke  Social History: former smoker (quit 2013), hx of alcohol abuse but currently no use  Labs: 04/01/23- TC 138, TG 86, HDL 30, LDL 91 - on rosuvastatin 40 mg, ezetimibe 10 mg daily 05/05/21- TC 121, TG 86, HDL 42, LDL 61 - on rosuvastatin 40 mg  Home BP Readings: 130s/80s  Past Medical History:  Diagnosis Date   Bipolar 1 disorder (HCC)    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   Chronic fatigue    Drug abuse (HCC)    Family history of breast cancer 05/19/2021   Family history of colon cancer in mother    GERD (gastroesophageal reflux disease)    +Barretts   Hematochezia    hemorrhoids   Hepatic steatosis    +hepatomegaly   History of CVA (cerebrovascular accident)    radiation-induced carotid damage/changes   History of hepatitis C    +treated   History of Hodgkin's lymphoma    age 26->mantle RT + splenectomy   History of splenectomy    Hyperlipidemia    Hypertension    Insomnia    Male hypogonadism    Moderate persistent asthma  Obesity, Class I, BMI 30-34.9    Papillary thyroid carcinoma (HCC) 2010   total thyroidectomy + neck dissection   Postsurgical hypothyroidism    Prediabetes    Pulmonary fibrosis (HCC)    ?rad induced?    Current Outpatient Medications on File Prior to Visit  Medication Sig Dispense Refill   albuterol (PROVENTIL) (2.5 MG/3ML) 0.083% nebulizer solution Take 3 mLs (2.5 mg total) by nebulization every 4 (four) hours as needed for wheezing or shortness of breath. 75 mL 1   albuterol (PROVENTIL) (5 MG/ML) 0.5% nebulizer solution       Albuterol-Budesonide 90-80 MCG/ACT AERO Inhale 2 Inhalations into the lungs every 4 (four) hours as needed. Maximum 12 inhalations per day. 10.7 g 2   ammonium lactate (LAC-HYDRIN) 12 % lotion Apply 1 application topically as needed for dry skin.     aspirin 325 MG tablet Take 1 tablet (325 mg total) by mouth daily. 30 tablet 0   azelastine (ASTELIN) 0.1 % nasal spray Place 1 spray into both nostrils daily. Use in each nostril as directed 30 mL 0   baclofen (LIORESAL) 10 MG tablet Take 1 tablet by mouth three times daily as needed 30 tablet 5   BELSOMRA 10 MG TABS TAKE 1 TABLET BY MOUTH AT BEDTIME 30 tablet 0   bisoprolol (ZEBETA) 5 MG tablet Take 0.5 tablets (2.5 mg total) by mouth at bedtime. 45 tablet 3   budesonide (PULMICORT) 0.5 MG/2ML nebulizer solution Mix with Xopenex nebulizer solution every 6 hours as needed. 120 mL 2   clindamycin (CLEOCIN T) 1 % lotion Apply topically as needed (rash).     EPINEPHrine 0.3 mg/0.3 mL IJ SOAJ injection Inject 0.3 mg into the muscle as needed for anaphylaxis. 2 each 2   esomeprazole (NEXIUM) 40 MG capsule TAKE 1 CAPSULE BY MOUTH TWICE DAILY BEFORE A MEAL 180 capsule 3   ezetimibe (ZETIA) 10 MG tablet Take 1 tablet (10 mg total) by mouth daily. 90 tablet 3   fluticasone (FLONASE) 50 MCG/ACT nasal spray Use 1 spray(s) in each nostril once daily 16 g 11   furosemide (LASIX) 20 MG tablet Take 1 tablet (20 mg total) by mouth 3 (three) times a week. 36 tablet 3   gabapentin (NEURONTIN) 300 MG capsule TAKE 4 CAPSULES BY MOUTH IN THE MORNING AND 2 CAPSULES AT NOON AND 2 CAPSULES AT BEDTIME (Patient taking differently: TAKE 2 Casp am, 2 caps noon, 4 caps bedtime) 240 capsule 5   hydrOXYzine (VISTARIL) 50 MG capsule Take 1 capsule (50 mg total) by mouth at bedtime. 90 capsule 3   levalbuterol (XOPENEX) 1.25 MG/3ML nebulizer solution Take 1.25 mg by nebulization every 4 (four) hours as needed for wheezing. 120 mL 1   levothyroxine (SYNTHROID) 300 MCG tablet Take  300 mcg by mouth daily before breakfast.     lisinopril (ZESTRIL) 40 MG tablet Take 1 tablet by mouth once daily 90 tablet 3   LUER LOCK SAFETY SYRINGES 22G X 1-1/2" 3 ML MISC USE FOR SELF ADMINISTER OF TESTOSTERONE 2CC ONCE EVERY 2 WEEKS     Multiple Vitamin (MULTI-VITAMIN) tablet Take 1 tablet by mouth daily.     rosuvastatin (CRESTOR) 40 MG tablet Take 1 tablet by mouth once daily 90 tablet 3   tamsulosin (FLOMAX) 0.4 MG CAPS capsule Take 1 capsule by mouth once daily 30 capsule 11   testosterone cypionate (DEPOTESTOSTERONE CYPIONATE) 200 MG/ML injection Inject 0.6 mLs (120 mg total) into the muscle once a week. 10 mL 0  traZODone (DESYREL) 150 MG tablet TAKE 2 TABLETS BY MOUTH AT BEDTIME 180 tablet 3   TRELEGY ELLIPTA 200-62.5-25 MCG/ACT AEPB INHALE 1 PUFF INTO LUNGS ONCE DAILY 60 each 0   VENTOLIN HFA 108 (90 Base) MCG/ACT inhaler INHALE 2 PUFFS BY MOUTH EVERY 6 HOURS AS NEEDED FOR SHORTNESS OF BREATH 18 g 1   No current facility-administered medications on file prior to visit.    Allergies  Allergen Reactions   Ciprofloxacin Other (See Comments)    Spontaneous rupture of tendon   Earnestine Leys Officinalis] Anaphylaxis and Hives   Fluocinolone Other (See Comments)    "spontaneous rupture of tendons" per pt Troy.    Creola Corn:  1. Hyperlipidemia - Uncontrolled based on last LDL 91 (04/01/23), above goal < 55 mg/dl due to premature ASCVD. He is already on high intensity rosuvastatin 40mg  daily and ezetimibe so plan to initiate Repatha for additional LDL lowering. Per pt preference to minimize pill burden, will discontinue ezetimibe once he starts Repatha as LDL should be at goal on high intensity statin and PCSK9. Continue rosuvastatin 40 mg. Prior authorization for Repatha is approved through 11/20/23. Will call pt to update him and send prescription to pharmacy. Follow up lipid panel due in ~8 weeks.  2. HFmrEF with EF 45% - BP elevated today with room to further optimize HF GDMT.  Will increase bisoprolol to 5 mg today. Given pt concern of cough with lisinopril and need for GDMT optimization, will stop lisinopril and start Entresto 49-51mg  twice daily after 36 hour ACEi washout. Will call pt to update him and provide instructions for switching from ACE to [facility]. Follow up in 2 weeks. Plan to discuss adding on SGLT2i at that time.  3. Polypharmacy - Pt concerned with polypharmacy. He does take multiple medications that cause CNS depression, still dealing with insomnia. Will defer to PCP for adjustments of these medications.  Troy [name], PharmD PGY-1 Pharmacy Resident  Troy [name], PharmD, BCACP, CPP [facility] [address] [place], [place] [address] Phone: [phone]; Fax: [phone] 05/20/2023 4:49 PM

## 2023-05-20 ENCOUNTER — Ambulatory Visit: Payer: 59 | Attending: Cardiovascular Disease | Admitting: Pharmacist

## 2023-05-20 VITALS — BP 148/84 | HR 104 | Wt 220.4 lb

## 2023-05-20 DIAGNOSIS — I10 Essential (primary) hypertension: Secondary | ICD-10-CM

## 2023-05-20 DIAGNOSIS — I700 Atherosclerosis of aorta: Secondary | ICD-10-CM | POA: Diagnosis not present

## 2023-05-20 DIAGNOSIS — I5020 Unspecified systolic (congestive) heart failure: Secondary | ICD-10-CM

## 2023-05-20 MED ORDER — REPATHA SURECLICK 140 MG/ML ~~LOC~~ SOAJ: 140.0000 mg | SUBCUTANEOUS | 3 refills | Status: DC

## 2023-05-20 MED ORDER — ENTRESTO 49-51 MG PO TABS: 1.0000 | ORAL_TABLET | Freq: Two times a day (BID) | ORAL | 3 refills | Status: DC

## 2023-05-20 MED ORDER — BISOPROLOL FUMARATE 5 MG PO TABS: 5.0000 mg | ORAL_TABLET | Freq: Every day | ORAL | 5 refills | Status: DC

## 2023-05-20 NOTE — Patient Instructions (Addendum)
Your most recent LDL cholesterol was 91 and your goal LDL is <55.  Repatha can lower LDL 50-60% and help you reach your goal of <55. We will complete the insurance authorization for this medication and reach out you when it is approved. Then you can start taking one injection every 2 weeks.   Once you start Repatha, stop taking ezetimibe. Continue taking rosuvastatin.  Increase bisoprolol to 5 mg daily. This will help lower your HR and improve the pumping function of your heart.  Sherryll Burger is another medication that will help improve the pumping function of your heart and control your blood pressure. This medication will replace your lisinopril. We will complete the insurance authorization for this medication and reach out to you when it is approved.    Jardiance or Marcelline Deist are also beneficial for your heart. We can consider starting this medication at a future visit if you are open to it.

## 2023-05-22 ENCOUNTER — Other Ambulatory Visit: Payer: Self-pay | Admitting: Family Medicine

## 2023-05-22 DIAGNOSIS — F5104 Psychophysiologic insomnia: Secondary | ICD-10-CM

## 2023-05-23 ENCOUNTER — Encounter: Payer: Self-pay | Admitting: Family Medicine

## 2023-05-24 ENCOUNTER — Encounter: Payer: Self-pay | Admitting: Dermatology

## 2023-05-24 ENCOUNTER — Ambulatory Visit (INDEPENDENT_AMBULATORY_CARE_PROVIDER_SITE_OTHER): Payer: 59 | Admitting: Dermatology

## 2023-05-24 VITALS — BP 129/82 | HR 84

## 2023-05-24 DIAGNOSIS — L739 Follicular disorder, unspecified: Secondary | ICD-10-CM

## 2023-05-24 DIAGNOSIS — Z85828 Personal history of other malignant neoplasm of skin: Secondary | ICD-10-CM | POA: Diagnosis not present

## 2023-05-24 DIAGNOSIS — C4441 Basal cell carcinoma of skin of scalp and neck: Secondary | ICD-10-CM

## 2023-05-24 DIAGNOSIS — Q828 Other specified congenital malformations of skin: Secondary | ICD-10-CM

## 2023-05-24 DIAGNOSIS — Z808 Family history of malignant neoplasm of other organs or systems: Secondary | ICD-10-CM

## 2023-05-24 DIAGNOSIS — C44519 Basal cell carcinoma of skin of other part of trunk: Secondary | ICD-10-CM | POA: Diagnosis not present

## 2023-05-24 DIAGNOSIS — B079 Viral wart, unspecified: Secondary | ICD-10-CM | POA: Diagnosis not present

## 2023-05-24 DIAGNOSIS — D485 Neoplasm of uncertain behavior of skin: Secondary | ICD-10-CM

## 2023-05-24 DIAGNOSIS — L709 Acne, unspecified: Secondary | ICD-10-CM

## 2023-05-24 MED ORDER — CIMETIDINE 200 MG PO TABS: ORAL_TABLET | ORAL | 2 refills | Status: DC

## 2023-05-24 MED ORDER — DOXYCYCLINE HYCLATE 100 MG PO CAPS: ORAL_CAPSULE | ORAL | 1 refills | Status: DC

## 2023-05-24 NOTE — Progress Notes (Unsigned)
New Patient Visit   Subjective  Troy [name] is a 47 y.o. male who presents for the following: spots of concern  Patient states he has spots located at the left axilla and right upper chest that he would like to have examined. Patient reports the area under his arm has been there for around 5 months and will get itchy. He reports the areas are worrisome to him. Patient reports the area under his arm has not  previously been treated.  He states that spot on his upper chest has changed in appearance but it was previously evaluated by a dermatologist who said it was benign.  Pt has a growth on his left forearm for several months that seems to be getting larger. It has not been evaluated before. Pt has growths on his right hand that seems to be spreading. It is not symptomatic. It has not been evaluated.  Patient has Hx of BCC x5, his last was on his penis last year. Patient has family history of skin cancer(s).    The patient has spots, moles and lesions to be evaluated, some may be new or changing and the patient may have concern these could be cancer.   The following portions of the chart were reviewed this encounter and updated as appropriate: medications, allergies, medical history  Review of Systems:  No other skin or systemic complaints except as noted in HPI or Assessment and Plan.  Objective  Well appearing patient in no apparent distress; mood and affect are within normal limits.   A focused examination was performed of the following areas: Left axilla , left forearm, right hand, and right upper chest  Relevant exam findings are noted in the Assessment and Plan.  Exam: Perifollicular erythematous papules and pustules on arms and shoulders  Exam: Smooth, pink/flesh dome-shaped papules with central umbilication - Discussed viral etiology and contagion on bilateral hands  Neck - Posterior 4mm pearly papule with vessels and speckles       Right Breast 6mm pink pearly  papule          Assessment & Plan   FOLLICULITIS  Folliculitis occurs due to inflammation of the superficial hair follicle (pore), resulting in acne-like lesions (pus bumps). It can be infectious (bacterial, fungal) or noninfectious (shaving, tight clothing, heat/sweat, medications).  Folliculitis can be acute or chronic and recommended treatment depends on the underlying cause of folliculitis.  Treatment Plan: -Doxycycline 100mg  take 1 tablet daily with food for 1 month to manage skin condition -Benzoyl peroxide/clindamycin/retinoid combo cream (sample given) to spot treat areas under the arm -OTC Hydrocortisone cream apply to affected areas prn for itchiness -Panoxyl or Cerave Benzoyl peroxide wash use daily to reduce bacteria and prevent acne    MOLLUSCUM CONTAGIOSUM  Molluscum are small wart-like bumps caused by a viral infection in the skin and is easily spread.  It may be more common and more easily spread in children who have eczema, because of dry inflamed skin and frequent scratching.  Use your prescription topical eczema medication as directed if prescribed.  Recommend routine use of mild soap and moisturizing cream to prevent spread.  Do not share towels.  Multiple treatments may be required to clear molluscum.  New spots may occur, even when treated ones clear.  Treatment Plan: -Cimetidine 200mg  Take 1 table daily for 3 months   Neoplasm of uncertain behavior of skin (2) Neck - Posterior  Skin / nail biopsy Type of biopsy: tangential   Informed consent: discussed  and consent obtained   Timeout: patient name, date of birth, surgical site, and procedure verified   Procedure prep:  Patient was prepped and draped in usual sterile fashion Prep type:  Isopropyl alcohol Anesthesia: the lesion was anesthetized in a standard fashion   Anesthetic:  1% lidocaine w/ epinephrine 1-100,000 buffered w/ 8.4% NaHCO3 Instrument used: DermaBlade   Hemostasis achieved with: aluminum  chloride   Outcome: patient tolerated procedure well   Post-procedure details: sterile dressing applied and wound care instructions given   Dressing type: petrolatum gauze and bandage    Specimen 1 - Surgical pathology Differential Diagnosis: R/O pigmented BCC  Check Margins: No  Right Breast  Skin / nail biopsy  Skin / nail biopsy Type of biopsy: tangential   Informed consent: discussed and consent obtained   Timeout: patient name, date of birth, surgical site, and procedure verified   Procedure prep:  Patient was prepped and draped in usual sterile fashion Prep type:  Isopropyl alcohol Anesthesia: the lesion was anesthetized in a standard fashion   Anesthetic:  1% lidocaine w/ epinephrine 1-100,000 buffered w/ 8.4% NaHCO3 Instrument used: DermaBlade   Hemostasis achieved with: aluminum chloride   Outcome: patient tolerated procedure well   Post-procedure details: sterile dressing applied and wound care instructions given   Dressing type: petrolatum gauze and bandage    Specimen 2 - Surgical pathology Differential Diagnosis: R/O BCC  Check Margins: No    Return in about 6 weeks (around 07/05/2023) for folliculitis.  Owens Shark, CMA, am acting as scribe for [employer], DO.   Documentation: I have reviewed the above documentation for accuracy and completeness, and I agree with the above.  Langston Reusing, DO

## 2023-05-24 NOTE — Patient Instructions (Addendum)
Hello Troy [name],  Thank you for visiting our clinic today. I appreciate your commitment to addressing your dermatological health concerns. Here is a summary of our discussion and the treatment plan we have established:  - Medications Prescribed:   - Doxycycline 100 mg: Take one tablet daily with food for one month to manage skin conditions.   - Cimetidine 200 mg: Take one tablet daily for three months to treat molluscum contagiosum.   - Topical Treatment:     - Benzoyl Peroxide, Clindamycin, and Retinoid Combo Cream (Sample tube provided) : Use as directed to spot treat areas under your arm.     - Hydrocortisone Cream: Apply as needed for itchiness.  - Skin Care Recommendations:   - PanOxyl or CeraVe Wash Benzoyl Peroxide: Use daily, especially after weightlifting, to reduce bacteria and prevent acne.  - Follow-Up and Monitoring:   - Biopsy of Skin Lesions: We will perform biopsies to determine the nature of the skin lesions. You will be informed via MyChart if they are benign. If they are skin cancers, our surgery team will contact you.   - Follow-Up Appointment: Scheduled for six weeks from today to evaluate the treatment's progress and discuss further steps.   Please ensure to follow the treatment plan as discussed and reach out if you have any questions or concerns in the meantime. Looking forward to seeing you in six weeks for your follow-up.  Best regards,  Dr. Langston Reusing [facility]      Patient Handout: Wound Care for Skin Biopsy Site  Taking Care of Your Skin Biopsy Site  Proper care of the biopsy site is essential for promoting healing and minimizing scarring. This handout provides instructions on how to care for your biopsy site to ensure optimal recovery.  1. Cleaning the Wound:  Clean the biopsy site daily with gentle soap and water. Gently pat the area dry with a clean, soft towel. Avoid harsh scrubbing or rubbing the area, as this can irritate the skin and  delay healing.  2. Applying Aquaphor and Bandage:  After cleaning the wound, apply a thin layer of Aquaphor ointment to the biopsy site. Cover the area with a sterile bandage to protect it from dirt, bacteria, and friction. Change the bandage daily or as needed if it becomes soiled or wet.  3. Continued Care for One Week:  Repeat the cleaning, Aquaphor application, and bandaging process daily for one week following the biopsy procedure. Keeping the wound clean and moist during this initial healing period will help prevent infection and promote optimal healing.  4. Massaging Aquaphor into the Area:  ---After one week, discontinue the use of bandages but continue to apply Aquaphor to the biopsy site. ----Gently massage the Aquaphor into the area using circular motions. ---Massaging the skin helps to promote circulation and prevent the formation of scar tissue.   Additional Tips:  Avoid exposing the biopsy site to direct sunlight during the healing process, as this can cause hyperpigmentation or worsen scarring. If you experience any signs of infection, such as increased redness, swelling, warmth, or drainage from the wound, contact your healthcare provider immediately. Follow any additional instructions provided by your healthcare provider for caring for the biopsy site and managing any discomfort. Conclusion:  Taking proper care of your skin biopsy site is crucial for ensuring optimal healing and minimizing scarring. By following these instructions for cleaning, applying Aquaphor, and massaging the area, you can promote a smooth and successful recovery. If you have any questions or  concerns about caring for your biopsy site, don't hesitate to contact your healthcare provider for guidance.    Due to recent changes in healthcare laws, you may see results of your pathology and/or laboratory studies on MyChart before the doctors have had a chance to review them. We understand that in some  cases there may be results that are confusing or concerning to you. Please understand that not all results are received at the same time and often the doctors may need to interpret multiple results in order to provide you with the best plan of care or course of treatment. Therefore, we ask that you please give [place] 2 business days to thoroughly review all your results before contacting the office for clarification. Should we see a critical lab result, you will be contacted sooner.   If You Need Anything After Your Visit  If you have any questions or concerns for your doctor, please call our main line at [phone] If no one answers, please leave a voicemail as directed and we will return your call as soon as possible. Messages left after 4 pm will be answered the following business day.   You may also send [place] a message via MyChart. We typically respond to MyChart messages within 1-2 business days.  For prescription refills, please ask your pharmacy to contact our office. Our fax number is [phone].  If you have an urgent issue when the clinic is closed that cannot wait until the next business day, you can page your doctor at the number below.    Please note that while we do our best to be available for urgent issues outside of office hours, we are not available 24/7.   If you have an urgent issue and are unable to reach [place], you may choose to seek medical care at your doctor's office, retail clinic, urgent care center, or emergency room.  If you have a medical emergency, please immediately call 911 or go to the emergency department. In the event of inclement weather, please call our main line at [phone] for an update on the status of any delays or closures.  Dermatology Medication Tips: Please keep the boxes that topical medications come in in order to help keep track of the instructions about where and how to use these. Pharmacies typically print the medication instructions only on the  boxes and not directly on the medication tubes.   If your medication is too expensive, please contact our office at [phone] or send [place] a message through MyChart.   We are unable to tell what your co-pay for medications will be in advance as this is different depending on your insurance coverage. However, we may be able to find a substitute medication at lower cost or fill out paperwork to get insurance to cover a needed medication.   If a prior authorization is required to get your medication covered by your insurance company, please allow [place] 1-2 business days to complete this process.  Drug prices often vary depending on where the prescription is filled and some pharmacies may offer cheaper prices.  The website www.goodrx.com contains coupons for medications through different pharmacies. The prices here do not account for what the cost may be with help from insurance (it may be cheaper with your insurance), but the website can give you the price if you did not use any insurance.  - You can print the associated coupon and take it with your prescription to the pharmacy.  - You may also stop  by our office during regular business hours and pick up a GoodRx coupon card.  - If you need your prescription sent electronically to a different pharmacy, notify our office through [facility] or by phone at [phone]

## 2023-05-26 ENCOUNTER — Telehealth: Payer: Self-pay

## 2023-05-26 ENCOUNTER — Other Ambulatory Visit (HOSPITAL_COMMUNITY): Payer: Self-pay

## 2023-05-26 ENCOUNTER — Encounter: Payer: Self-pay | Admitting: Hematology

## 2023-05-26 NOTE — Telephone Encounter (Signed)
Pharmacy Patient Advocate Encounter   Received notification from CoverMyMeds that prior authorization for ENTRESTO 49-51MG  is required/requested.   Insurance verification completed.   The patient is insured through  [facility] MEDICARE/[employer]  .   Per test claim: Refill too soon. PA is not needed at this time. Medication was filled currently. Next eligible fill date is 06/12/23.

## 2023-05-30 NOTE — Progress Notes (Signed)
Please call patient and notify that one the the bx results were positive for a skin cancer that needs to be treated with Mohs Surgery.  The other spot was benign  We will refer to Dr. Caralyn Guile  Diagnosis 1. Skin , neck-posterior BASAL CELL CARCINOMA, NODULAR PATTERN, ULCERATED --> Mohs w/ Dr Caralyn Guile 2. Skin , right breast POROKERATOSIS --> Benign

## 2023-05-31 ENCOUNTER — Ambulatory Visit: Payer: 59 | Admitting: Allergy & Immunology

## 2023-05-31 ENCOUNTER — Telehealth: Payer: Self-pay

## 2023-05-31 ENCOUNTER — Other Ambulatory Visit: Payer: Self-pay

## 2023-05-31 ENCOUNTER — Encounter: Payer: Self-pay | Admitting: Allergy & Immunology

## 2023-05-31 VITALS — BP 132/80 | HR 101 | Temp 98.4°F | Resp 17

## 2023-05-31 DIAGNOSIS — J3089 Other allergic rhinitis: Secondary | ICD-10-CM | POA: Diagnosis not present

## 2023-05-31 DIAGNOSIS — Y9389 Activity, other specified: Secondary | ICD-10-CM

## 2023-05-31 DIAGNOSIS — J4550 Severe persistent asthma, uncomplicated: Secondary | ICD-10-CM

## 2023-05-31 DIAGNOSIS — T782XXD Anaphylactic shock, unspecified, subsequent encounter: Secondary | ICD-10-CM | POA: Diagnosis not present

## 2023-05-31 DIAGNOSIS — J302 Other seasonal allergic rhinitis: Secondary | ICD-10-CM | POA: Diagnosis not present

## 2023-05-31 DIAGNOSIS — R058 Other specified cough: Secondary | ICD-10-CM

## 2023-05-31 MED ORDER — LEVALBUTEROL TARTRATE 45 MCG/ACT IN AERO: 2.0000 | INHALATION_SPRAY | RESPIRATORY_TRACT | 4 refills | Status: DC | PRN

## 2023-05-31 NOTE — Patient Instructions (Addendum)
1. Moderate persistent asthma, uncomplicated - Lung testing looks much better today.   - We will hold off on the biologics at this point.  - I think that it is ok to use the neb treatment twice daily. - Daily controller medication(s): Trelegy 200/62.5/25 one puff once daily + Pulmicort (budesonide)/levalbuterol nebulizer twice daily  - Prior to physical activity: Xopenex 2 puffs 10-15 minutes before physical activity. - Rescue medications: Xopenex (levalbuterol) 4 puffs every 4-6 hours as needed  - Asthma control goals:  * Full participation in all desired activities (may need albuterol before activity) * Albuterol use two time or less a week on average (not counting use with activity) * Cough interfering with sleep two time or less a month * Oral steroids no more than once a year * No hospitalizations  2. Chronic rhinitis (dust mites, cat, dog, grasses, molds, trees, and ragweed) - Continue with the Claritin (loratadine) 10mg  daily.  - Continue with Allegra (fexofenadine) 10mg  daily.  - Continue with Allercort one spray per nostril daily. - Continue with Astelin one spray per nostril daily.  3. 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  - We will continue to monitor.    4. Return in about 4 months (around 09/30/2023).    Please inform us of any Emergency Department visits, hospitalizations, or changes in symptoms. Call us before going to the ED for breathing or allergy symptoms since we might be able to fit you in for a sick visit. Feel free to contact us anytime with any questions, problems, or concerns.  It was a pleasure to see you again today!   Websites that have reliable patient information: 1. American Academy of Asthma, Allergy, and Immunology: www.aaaai.org 2. Food Allergy Research and Education (FARE): foodallergy.org 3. Mothers of Asthmatics: http://www.asthmacommunitynetwork.org 4. American College of Allergy, Asthma, and Immunology:  www.acaai.org   COVID-19 Vaccine Information can be found at: [email or web address] For questions related to vaccine distribution or appointments, please email [email or web address] or call [phone].   We realize that you might be concerned about having an allergic reaction to the COVID19 vaccines. To help with that concern, WE ARE OFFERING THE COVID19 VACCINES IN OUR OFFICE! Ask the front desk for dates!     "Like" [place] on Facebook and Instagram for our latest updates!      A healthy democracy works best when [employer] participate! Make sure you are registered to vote! If you have moved or changed any of your contact information, you will need to get this updated before voting!  In some cases, you MAY be able to register to vote online: [email or web address]

## 2023-05-31 NOTE — Telephone Encounter (Signed)
-----   Message from Langston Reusing sent at 05/30/2023 12:34 PM EDT ----- Please call patient and notify that one the the bx results were positive for a skin cancer that needs to be treated with Mohs Surgery.  The other spot was benign  We will refer to Dr. Caralyn Guile  Diagnosis 1. Skin , neck-posterior BASAL CELL CARCINOMA, NODULAR PATTERN, ULCERATED --> Mohs w/ Dr Caralyn Guile 2. Skin , right breast POROKERATOSIS --> Benign

## 2023-05-31 NOTE — Progress Notes (Signed)
FOLLOW UP  Date of Service/Encounter:  05/31/23   Assessment:   Moderate persistent asthma - much better on Trelegy in combination with budeonisde + albuterol BID   Heart failure with reduced ejection fraction - on Lasix for two antihypertensives    Elevated AEC in the past (600 in March 2020) - previously on Nucala, although he did not feel that this provided much relief, although prednisone use seems to have increased since stopping the Nucala   Perennial and seasonal allergic rhinitis (dust mites, cat, dog, grasses, molds, trees, and ragweed)   Hypercholesterolemia - planning to start Repatha this week   Complicated past medical history, including Hodgkin's lymphoma at age 76 and subsequent malignancies since that time    Plan/Recommendations:   1. Moderate persistent asthma, uncomplicated - Lung testing looks much better today.   - We will hold off on the biologics at this point.  - I think that it is ok to use the neb treatment twice daily. - Daily controller medication(s): Trelegy 200/62.5/25 one puff once daily + Pulmicort (budesonide)/levalbuterol nebulizer twice daily  - Prior to physical activity: Xopenex 2 puffs 10-15 minutes before physical activity. - Rescue medications: Xopenex (levalbuterol) 4 puffs every 4-6 hours as needed  - Asthma control goals:  * Full participation in all desired activities (may need albuterol before activity) * Albuterol use two time or less a week on average (not counting use with activity) * Cough interfering with sleep two time or less a month * Oral steroids no more than once a year * No hospitalizations  2. Chronic rhinitis (dust mites, cat, dog, grasses, molds, trees, and ragweed) - Continue with the Claritin (loratadine) 10mg  daily.  - Continue with Allegra (fexofenadine) 10mg  daily.  - Continue with Allercort one spray per nostril daily. - Continue with Astelin one spray per nostril daily.  3. Possible exercise-induced  anaphylaxis - EpiPen is up to date.  - We will continue to monitor.    4. Return in about 4 months (around 09/30/2023).   Subjective:   Troy [name] is a 47 y.o. male presenting today for follow up of  Chief Complaint  Patient presents with   Asthma   Cough    Troy [name] has a history of the following: Patient Active Problem List   Diagnosis Date Noted   Polycythemia, secondary 04/22/2023   Moderate persistent asthma with exacerbation 03/29/2023   SVT (supraventricular tachycardia) 12/30/2022   Aortic atherosclerosis (HCC) 12/30/2022   HFrEF (heart failure with reduced ejection fraction) (HCC) 12/30/2022   Blood pressure alteration 12/30/2022   Erythrocytosis 11/13/2022   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   Lower urinary tract symptoms (LUTS) 06/02/2022   Rotator cuff tear, right 03/03/2022   History of squamous cell carcinoma in situ (SCCIS) of skin 02/22/2022   Osteoarthritis of acromioclavicular joint 10/15/2021   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   History of splenectomy 02/02/2021   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   Erectile dysfunction 02/02/2021   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   Pulmonary fibrosis (HCC) 02/02/2021   Peripheral neuropathy 02/02/2021   History of hepatitis C 02/02/2021   Back pain 02/02/2021   Hyperlipidemia 02/02/2021   Moderate persistent asthma, uncomplicated 08/13/2020   History of Hodgkin's lymphoma 08/13/2020   Chronic rhinitis 08/13/2020   Not currently working due to disabled status 08/13/2020   Pain in joint of left knee 07/25/2020   History of thyroid cancer 06/09/2020   Arthralgia of left elbow 05/01/2020  Autonomic dysfunction 04/23/2020   Iron deficiency anemia due to chronic blood loss 01/18/2020   Microalbuminuria 01/18/2020   Chronic insomnia 01/17/2020   Large liver 01/17/2020   Prediabetes 01/11/2019   Daytime somnolence 07/22/2017   History of basal cell carcinoma  (BCC) 11/30/2016   GERD (gastroesophageal reflux disease) 08/25/2016   Essential hypertension 08/25/2016   Substance induced mood disorder (HCC)    History of stroke     History obtained from: chart review and patient.  Troy [name] is a 47 y.o. male presenting for a follow up visit. He did go see Dr. Onalee Hua with dermatology 1 week ago.  He had a biopsy that was consistent with a basal cell carcinoma.  She recommended Mohs surgery.  He also had another biopsy done on his right breast that showed porokeratosis which is benign.  He is diagnosed with folliculitis and started on doxycycline 100 mg once a day for 1 month to be followed up with the use of benzyl peroxide, topical clindamycin, and retinoid cream.  Since the last visit, he has done well.   Asthma/Respiratory Symptom History: He is still coughing up a lot of stuff. Overall he  is still having issues with his breathing. He is using the nebulizer BID. He still still using his Trelegy one puff once.  He is actually mixing the budesonide and leave albuterol once in the morning and using half of it in the morning and half of it at night, so is not really using it twice a day per se.  However, this last him to have a normal day and tolerate exercise.  He is not really interested in starting a biologic again for his asthma since he is starting Repatha soon.  He does smoke marijuana routinely, and he knows that this likely contributes to his respiratory distress.  However, it helps to calm him down and has been able to allow him to decrease his anxiety medications.  Regardless, he is doing much better than when we saw him last time.  His chest CT from the middle of July was largely normal.  He did have biapical pleuroparenchymal scarring right greater than left with some scattered basilar pulmonary nodules measuring up to 4 mm in the left lower lobe which have been unchanged for the past 7 years.  Official read shown below:  IMPRESSION: 1. No evidence  of interstitial lung disease. Air trapping is indicative of small airways disease. 2.  Aortic atherosclerosis (ICD10-I70.0).  Allergic Rhinitis Symptom History: He remains on the Claritin, Allegra, Ala-Cort, and Astelin daily.  This combination seems to be doing the trick.  He has not been interested in allergy shots in the past.  He has not been on antibiotics aside from the doxycycline that he remains on for folliculitis.  Skin Symptom History: He did like the Dermatologist as well.  He has had Mohs surgery in the past, but he did not like it.  He is not really happy to hear that he needs Mohs surgery on his basal cell carcinoma.   He is starting Repatha for his heart. He is starting this on Friday; he would prefer to be on only one biologic.   He had some lesions in his mouth. There was concern for possible cancer. He was referred to see an oral pathologist at [facility], but never was able to get into see him.  He is still having lesions, but they are not concerning to him.  Otherwise, there have been no changes to his past  medical history, surgical history, family history, or social history.    Review of systems otherwise negative other than that mentioned in the HPI.    Objective:   Blood pressure 132/80, pulse (!) 101, temperature 98.4 F (36.9 C), temperature source Temporal, resp. rate 17, SpO2 95%. There is no height or weight on file to calculate BMI.    Physical Exam Vitals reviewed.  Constitutional:      Appearance: He is well-developed.     Comments: Pleasant very talkative male.  HENT:     Head: Normocephalic and atraumatic.     Right Ear: Tympanic membrane, ear canal and external ear normal.     Left Ear: Tympanic membrane, ear canal and external ear normal.     Nose: No nasal deformity, septal deviation, mucosal edema or rhinorrhea.     Right Turbinates: Enlarged, swollen and pale.     Left Turbinates: Enlarged, swollen and pale.     Right Sinus: No maxillary sinus  tenderness or frontal sinus tenderness.     Left Sinus: No maxillary sinus tenderness or frontal sinus tenderness.     Comments: No nasal polyps.    Mouth/Throat:     Lips: Pink.     Mouth: Mucous membranes are moist. Mucous membranes are not pale and not dry. Oral lesions present.     Pharynx: Uvula midline.     Comments: He does have some white linear lesions on the bilateral sides of his tongue. Eyes:     General: Lids are normal. No allergic shiner.       Right eye: No discharge.        Left eye: No discharge.     Conjunctiva/sclera: Conjunctivae normal.     Right eye: Right conjunctiva is not injected. No chemosis.    Left eye: Left conjunctiva is not injected. No chemosis.    Pupils: Pupils are equal, round, and reactive to light.  Cardiovascular:     Rate and Rhythm: Normal rate and regular rhythm.     Heart sounds: Normal heart sounds.  Pulmonary:     Effort: Pulmonary effort is normal. No tachypnea, accessory muscle usage or respiratory distress.     Breath sounds: Examination of the right-upper field reveals rhonchi. Examination of the left-upper field reveals rhonchi. Examination of the right-middle field reveals rhonchi. Examination of the left-middle field reveals rhonchi. Rhonchi present. No wheezing or rales.     Comments: Coarse upper airway sounds throughout.  Air movement much better than it was in the past.  No wheezing. Chest:     Chest wall: No tenderness.  Lymphadenopathy:     Cervical: No cervical adenopathy.  Skin:    Coloration: Skin is not pale.     Findings: No abrasion, erythema, petechiae or rash. Rash is not papular, urticarial or vesicular.  Neurological:     Mental Status: He is alert.  Psychiatric:        Behavior: Behavior is cooperative.      Diagnostic studies:    Spirometry: results normal (FEV1: 3.06/75%, FVC: 4.28/84%, FEV1/FVC: 71%).    Spirometry consistent with normal pattern.    Allergy Studies: none        Malachi Bonds,  MD  [facility]

## 2023-05-31 NOTE — Telephone Encounter (Signed)
LVMTCB regarding Bx results. Requested pt to cb.

## 2023-06-01 NOTE — Telephone Encounter (Signed)
LVMTCB Regarding Bx results.

## 2023-06-02 NOTE — Telephone Encounter (Signed)
-----   Message from Langston Reusing sent at 05/30/2023 12:34 PM EDT ----- Please call patient and notify that one the the bx results were positive for a skin cancer that needs to be treated with Mohs Surgery.  The other spot was benign  We will refer to Dr. Caralyn Guile  Diagnosis 1. Skin , neck-posterior BASAL CELL CARCINOMA, NODULAR PATTERN, ULCERATED --> Mohs w/ Dr Caralyn Guile 2. Skin , right breast POROKERATOSIS --> Benign

## 2023-06-02 NOTE — Telephone Encounter (Signed)
Spoke with pt and advised the BX that was taken from the [place] breast show Porokeratosis and is Benign. I advised the Bx obtained from the Posterior neck shows [facility] which will require MOHS Surgery by Dr. Caralyn Guile. Patient states he will need to be scheduled on or after November 30th due to travel plans. Referral sent.

## 2023-06-02 NOTE — Progress Notes (Signed)
Patient ID: Troy [name]                 DOB: 1975-11-26                    MRN: [id number]     HPI: Troy [name] is a 47 y.o. male patient of Dr Troy [name] referred to lipid clinic by Troy Reedy, PA-C. PMH is significant for HFmrEF, CVA, Hodgkin's Lymphoma s/p high dose radiation, prior pulmonary fibrosis and asthma, tobacco abuse, current marijuana use, and HTN. Most recent echo 11/10/22 with LVEF 45%. 04/22/23 CT chest incidentally showed aortic atherosclerosis. Already on high intensity statin and ezetimibe so he was offered Repatha. Pt preferred to do his own research prior to initiating. Expressed concerns as he has been on other mabs for asthma which did not improve his symptoms and had side effects, but at appt on 7/30 with PA pt reported he was agreeable to Repatha. Pt has expressed concerns around continuity of care amongst his specialists and polypharmacy.   Last pharmacist visit 05/23/23 where bisoprolol was increased to 5 mg daily, lisinopril changed to Entresto 49-51mg  BID, ezetimibe replaced with Repatha, and SGLT2i discussed which pt preferred to research first.   Today pt reports to clinic for HF and lipid follow up. Has picked up Repatha and brings first injection to clinic with him today for administration instructions. Reports feeling well since last visit. Notes some mild lightheadedness at the gym and increased urination for a few days after starting Entresto, but this has since resolved. Tolerating the increase to bisoprolol 5 mg well. Has noticed resting HR will go down to 87 now and previously was usually >100. Reports BP has been ~130/80. Denies dizziness and SOB. Occasionally notices LE edema when he lifts weights. He wears tight shoes and knee braces and notices edema in between that area, but this quickly resolves after he is done lifting. He is ready to start an SLGT2 today after doing his own research. Reports he is checking his weight at home, yesterday 212 lbs. Has an  upcoming competition and needs weight to be <220 lbs so he is monitoring closely.   Current Medications: rosuvastatin 40 mg daily, Repatha 140 mg every 2 weeks Risk Factors: aortic atherosclerosis on chest CT, hx of premature ASCVD (stroke) LDL goal: <55 mg/dl  Current CHF meds: bisoprolol 5 mg daily, Entresto 49/51 mg BID, furosemide 20 mg three times a week (taking ~2x/month) Previously tried: metoprolol (fatigue) BP goal: <130/80  Diet: pt reports healthy diet  Exercise: power lifter, former marathon runner  Family History:  Mother- breast cancer, colon cancer Father- multiple myeloma, parkinsonism Maternal grandmother- stroke  Social History: former smoker (quit 2013), hx of alcohol abuse but currently no use  Labs: 04/01/23- TC 138, TG 86, HDL 30, LDL 91 - on rosuvastatin 40 mg, ezetimibe 10 mg daily 05/05/21- TC 121, TG 86, HDL 42, LDL 61 - on rosuvastatin 40 mg  Home BP Readings: 130s/80s  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   Chronic fatigue    Drug abuse (HCC)    Family history of breast cancer 05/19/2021   Family history of colon cancer in mother    GERD (gastroesophageal reflux disease)    +Barretts   Hematochezia    hemorrhoids   Hepatic steatosis    +hepatomegaly   History of CVA (  cerebrovascular accident)    radiation-induced carotid damage/changes   History of hepatitis C    +treated   History of Hodgkin's lymphoma    age 80->mantle RT + splenectomy   History of splenectomy    Hyperlipidemia    Hypertension    Insomnia    Male hypogonadism    Moderate persistent asthma    Obesity, Class I, BMI 30-34.9    Papillary thyroid carcinoma (HCC) 2010   total thyroidectomy + neck dissection   Postsurgical hypothyroidism    Prediabetes    Pulmonary fibrosis (HCC)    ?rad induced?    Current Outpatient Medications on File Prior to Visit  Medication Sig  Dispense Refill   albuterol (PROVENTIL) (2.5 MG/3ML) 0.083% nebulizer solution Take 3 mLs (2.5 mg total) by nebulization every 4 (four) hours as needed for wheezing or shortness of breath. 75 mL 1   Albuterol-Budesonide 90-80 MCG/ACT AERO Inhale 2 Inhalations into the lungs every 4 (four) hours as needed. Maximum 12 inhalations per day. 10.7 g 2   ammonium lactate (LAC-HYDRIN) 12 % lotion Apply 1 application topically as needed for dry skin.     aspirin 325 MG tablet Take 1 tablet (325 mg total) by mouth daily. 30 tablet 0   azelastine (ASTELIN) 0.1 % nasal spray Place 1 spray into both nostrils daily. Use in each nostril as directed 30 mL 0   baclofen (LIORESAL) 10 MG tablet Take 1 tablet by mouth three times daily as needed 30 tablet 5   bisoprolol (ZEBETA) 5 MG tablet Take 1 tablet (5 mg total) by mouth at bedtime. 30 tablet 5   budesonide (PULMICORT) 0.5 MG/2ML nebulizer solution Mix with Xopenex nebulizer solution every 6 hours as needed. 120 mL 2   cimetidine (TAGAMET) 200 MG tablet Take 1 tablet daily (Patient not taking: Reported on 05/31/2023) 30 tablet 2   clindamycin (CLEOCIN T) 1 % lotion Apply topically as needed (rash).     doxycycline (VIBRAMYCIN) 100 MG capsule Take 1 with food once a day for 1 month 30 capsule 1   EPINEPHrine 0.3 mg/0.3 mL IJ SOAJ injection Inject 0.3 mg into the muscle as needed for anaphylaxis. 2 each 2   esomeprazole (NEXIUM) 40 MG capsule TAKE 1 CAPSULE BY MOUTH TWICE DAILY BEFORE A MEAL 180 capsule 3   Evolocumab (REPATHA SURECLICK) 140 MG/ML SOAJ Inject 140 mg into the skin every 14 (fourteen) days. (Patient not taking: Reported on 05/31/2023) 6 mL 3   fluticasone (FLONASE) 50 MCG/ACT nasal spray Use 1 spray(s) in each nostril once daily (Patient not taking: Reported on 05/31/2023) 16 g 11   furosemide (LASIX) 20 MG tablet Take 1 tablet (20 mg total) by mouth 3 (three) times a week. 36 tablet 3   gabapentin (NEURONTIN) 300 MG capsule TAKE 4 CAPSULES BY MOUTH IN  THE MORNING AND 2 CAPSULES AT NOON AND 2 CAPSULES AT BEDTIME (Patient taking differently: TAKE 2 Casp am, 2 caps noon, 4 caps bedtime) 240 capsule 5   hydrOXYzine (VISTARIL) 50 MG capsule Take 1 capsule (50 mg total) by mouth at bedtime. 90 capsule 3   levalbuterol (XOPENEX HFA) 45 MCG/ACT inhaler Inhale 2 puffs into the lungs every 4 (four) hours as needed for wheezing. 2 each 4   levalbuterol (XOPENEX) 1.25 MG/3ML nebulizer solution Take 1.25 mg by nebulization every 4 (four) hours as needed for wheezing. 120 mL 1   levothyroxine (SYNTHROID) 300 MCG tablet Take 300 mcg by mouth daily before breakfast.  LUER LOCK SAFETY SYRINGES 22G X 1-1/2" 3 ML MISC USE FOR SELF ADMINISTER OF TESTOSTERONE 2CC ONCE EVERY 2 WEEKS     Multiple Vitamin (MULTI-VITAMIN) tablet Take 1 tablet by mouth daily.     rosuvastatin (CRESTOR) 40 MG tablet Take 1 tablet by mouth once daily 90 tablet 3   sacubitril-valsartan (ENTRESTO) 49-51 MG Take 1 tablet by mouth 2 (two) times daily. 60 tablet 3   Suvorexant (BELSOMRA) 10 MG TABS TAKE 1 TABLET BY MOUTH AT BEDTIME 30 tablet 2   tamsulosin (FLOMAX) 0.4 MG CAPS capsule Take 1 capsule by mouth once daily 30 capsule 11   testosterone cypionate (DEPOTESTOSTERONE CYPIONATE) 200 MG/ML injection Inject 0.6 mLs (120 mg total) into the muscle once a week. 10 mL 0   traZODone (DESYREL) 150 MG tablet TAKE 2 TABLETS BY MOUTH AT BEDTIME 180 tablet 3   TRELEGY ELLIPTA 200-62.5-25 MCG/ACT AEPB INHALE 1 PUFF INTO LUNGS ONCE DAILY 60 each 0   VENTOLIN HFA 108 (90 Base) MCG/ACT inhaler INHALE 2 PUFFS BY MOUTH EVERY 6 HOURS AS NEEDED FOR SHORTNESS OF BREATH 18 g 1   No current facility-administered medications on file prior to visit.    Allergies  Allergen Reactions   Ciprofloxacin Other (See Comments)    Spontaneous rupture of tendon   Earnestine Leys Officinalis] Anaphylaxis and Hives   Fluocinolone Other (See Comments)    "spontaneous rupture of tendons" per pt dr.     Creola Corn:  1. HFmrEF with EF 45% - BP right at goal today with room to further optimize HF GDMT. Will obtain updated BMET today since switching lisinopril to Entresto 49-51 mg BID with plan to increase dose if labs stable. Patient was ready to start SGLT2 after doing his own research so will start Farxiga 10 mg daily. Discussed increasing bisoprolol to 10 mg daily, however he reports a bad experience with beta blockers in the past causing extreme fatigue and has had many medication changes recently. Will hold off on increasing for now due to patient preference. Can revisit in the future given HR remains elevated. Will follow up on Monday with lab results and provide instructions for medication titration.   2. Hyperlipidemia - Last LDL was 91 on rosuvastatin 40mg  daily and ezetimibe 10mg  daily, above goal < 55 due to premature ASCVD. At last PharmD visit, changed his ezetimibe to Repatha. He successfully self administered first injection in clinic today. Follow up lipid panel due in ~8 weeks.  Adam Phenix, PharmD PGY-1 Pharmacy Resident  Megan E. Supple, PharmD, BCACP, CPP [facility] [address], [place], [place] [address] Phone: [phone]; Fax: [phone] 06/03/2023 1:11 PM

## 2023-06-03 ENCOUNTER — Other Ambulatory Visit (HOSPITAL_COMMUNITY): Payer: Self-pay

## 2023-06-03 ENCOUNTER — Ambulatory Visit: Payer: 59 | Attending: Interventional Cardiology | Admitting: Pharmacist

## 2023-06-03 VITALS — BP 130/80 | HR 90 | Wt 219.0 lb

## 2023-06-03 DIAGNOSIS — I10 Essential (primary) hypertension: Secondary | ICD-10-CM

## 2023-06-03 DIAGNOSIS — I5020 Unspecified systolic (congestive) heart failure: Secondary | ICD-10-CM | POA: Diagnosis not present

## 2023-06-03 MED ORDER — DAPAGLIFLOZIN PROPANEDIOL 10 MG PO TABS: 10.0000 mg | ORAL_TABLET | Freq: Every day | ORAL | 3 refills | Status: DC

## 2023-06-03 NOTE — Patient Instructions (Addendum)
Start taking Farxiga 10mg  - 1 tablet once daily  We'll check your labs today, if they're stable we'd like to increase the dose of your Entresto  Continue taking your other medications  We'll call you next week with your lab results to finalize your medication plan and schedule follow up if needed

## 2023-06-04 LAB — BASIC METABOLIC PANEL
BUN/Creatinine Ratio: 23 — ABNORMAL HIGH (ref 9–20)
BUN: 23 mg/dL (ref 6–24)
CO2: 21 mmol/L (ref 20–29)
Calcium: 9.4 mg/dL (ref 8.7–10.2)
Chloride: 100 mmol/L (ref 96–106)
Creatinine, Ser: 1.01 mg/dL (ref 0.76–1.27)
Glucose: 102 mg/dL — ABNORMAL HIGH (ref 70–99)
Potassium: 5.4 mmol/L — ABNORMAL HIGH (ref 3.5–5.2)
Sodium: 134 mmol/L (ref 134–144)
eGFR: 93 mL/min/{1.73_m2} (ref >59)

## 2023-06-04 LAB — CBC
Hematocrit: 54.5 % — ABNORMAL HIGH (ref 37.5–51.0)
Hemoglobin: 17.5 g/dL (ref 13.0–17.7)
MCH: 32.9 pg (ref 26.6–33.0)
MCHC: 32.1 g/dL (ref 31.5–35.7)
MCV: 102 fL — ABNORMAL HIGH (ref 79–97)
Platelets: 223 10*3/uL (ref 150–450)
RBC: 5.32 x10E6/uL (ref 4.14–5.80)
RDW: 15.1 % (ref 11.6–15.4)
WBC: 8.5 10*3/uL (ref 3.4–10.8)

## 2023-06-06 ENCOUNTER — Telehealth: Payer: Self-pay | Admitting: Pharmacist

## 2023-06-06 DIAGNOSIS — I5020 Unspecified systolic (congestive) heart failure: Secondary | ICD-10-CM

## 2023-06-06 NOTE — Telephone Encounter (Signed)
Spoke with pt, not using K supplementation or Mrs Troy [name]. Drinks a preworkout drink called Gorilla Mode, pt states it contains 40mg  of K. His multivitamin contains 80mg . He'll stop the MVI and will recheck labs on Thursday.

## 2023-06-06 NOTE — Telephone Encounter (Signed)
K unfortunately increased to 5.4 after changing from lisinopril 40mg  daily to Entresto 49-51mg  BID despite this conversion being to lower than equivalent dose of ARB. Cannot increase Entresto as hoped, will likely need to decrease dose instead as long as confirm pt isn't taking any OTC K supplementation or using Mrs Sharilyn Sites, liquid IV, OTC supplementation etc. that would have increased his K.  Called pt to discuss, left message.

## 2023-06-07 ENCOUNTER — Other Ambulatory Visit: Payer: Self-pay | Admitting: Allergy & Immunology

## 2023-06-09 ENCOUNTER — Other Ambulatory Visit: Payer: Self-pay | Admitting: Allergy & Immunology

## 2023-06-09 ENCOUNTER — Ambulatory Visit: Payer: 59 | Attending: Family Medicine

## 2023-06-09 DIAGNOSIS — I5020 Unspecified systolic (congestive) heart failure: Secondary | ICD-10-CM

## 2023-06-10 ENCOUNTER — Telehealth: Payer: Self-pay | Admitting: Pharmacist

## 2023-06-10 DIAGNOSIS — I2510 Atherosclerotic heart disease of native coronary artery without angina pectoris: Secondary | ICD-10-CM

## 2023-06-10 DIAGNOSIS — I5020 Unspecified systolic (congestive) heart failure: Secondary | ICD-10-CM

## 2023-06-10 LAB — BASIC METABOLIC PANEL
BUN/Creatinine Ratio: 21 — ABNORMAL HIGH (ref 9–20)
BUN: 25 mg/dL — ABNORMAL HIGH (ref 6–24)
CO2: 19 mmol/L — ABNORMAL LOW (ref 20–29)
Calcium: 9.4 mg/dL (ref 8.7–10.2)
Chloride: 104 mmol/L (ref 96–106)
Creatinine, Ser: 1.19 mg/dL (ref 0.76–1.27)
Glucose: 91 mg/dL (ref 70–99)
Potassium: 5 mmol/L (ref 3.5–5.2)
Sodium: 139 mmol/L (ref 134–144)
eGFR: 77 mL/min/{1.73_m2} (ref >59)

## 2023-06-10 MED ORDER — BISOPROLOL FUMARATE 10 MG PO TABS: 10.0000 mg | ORAL_TABLET | Freq: Every day | ORAL | 3 refills | Status: DC

## 2023-06-10 NOTE — Telephone Encounter (Signed)
Spoke with pt. He stopped taking his MVI, was eating a lot of watermelon recently too which is high in K and has stopped this. He is ok with increasing his bisoprolol. Updated rx sent in for 10mg  dose. He will keep an eye on HR and BP. Due for 2nd Repatha dose next Friday. Scheduled f/u labs at the end of Sept for his cholesterol, will recheck renal function and electrolytes then to make sure they remain stable as well. Pt appreciative for the follow up.

## 2023-06-10 NOTE — Telephone Encounter (Signed)
Called pt to follow up with lab results and left message.  Repeat BMET imrpoved with K down to 5. Increase in SCr from 1.01 to 1.19, although previously has been in the 1.2-1.3 range historically. He started on SGLT2i recently which likely explains the bump, this is anticipated and fine to continue on med. Will continue Farxiga 10mg  daily, keep his Entresto at current dose as K will prevent further dose titration, cannot initiate spironolactone due to this either. Previously discussed increasing his bisoprolol dose due to tachycardia and HF however he did not want to do this due to previously feeling poor on beta blocker therapy. Can revisit this again to see if he is willing, otherwise his CHF meds are optimized and BP is at goal.

## 2023-06-27 ENCOUNTER — Encounter: Payer: Self-pay | Admitting: Allergy & Immunology

## 2023-06-28 MED ORDER — PREDNISONE 10 MG PO TABS: ORAL_TABLET | ORAL | 0 refills | Status: DC

## 2023-07-05 ENCOUNTER — Ambulatory Visit: Payer: 59

## 2023-07-06 ENCOUNTER — Ambulatory Visit: Payer: 59

## 2023-07-06 ENCOUNTER — Ambulatory Visit: Payer: 59 | Admitting: Dermatology

## 2023-07-08 ENCOUNTER — Ambulatory Visit: Payer: 59 | Attending: Internal Medicine

## 2023-07-08 DIAGNOSIS — I5020 Unspecified systolic (congestive) heart failure: Secondary | ICD-10-CM

## 2023-07-08 DIAGNOSIS — I2510 Atherosclerotic heart disease of native coronary artery without angina pectoris: Secondary | ICD-10-CM

## 2023-07-08 LAB — COMPREHENSIVE METABOLIC PANEL
ALT: 186 [IU]/L — ABNORMAL HIGH (ref 0–44)
AST: 140 [IU]/L — ABNORMAL HIGH (ref 0–40)
Albumin: 4.2 g/dL (ref 4.1–5.1)
Alkaline Phosphatase: 99 [IU]/L (ref 44–121)
BUN/Creatinine Ratio: 23 — ABNORMAL HIGH (ref 9–20)
BUN: 25 mg/dL — ABNORMAL HIGH (ref 6–24)
Bilirubin Total: 1 mg/dL (ref 0.0–1.2)
CO2: 24 mmol/L (ref 20–29)
Calcium: 9.3 mg/dL (ref 8.7–10.2)
Chloride: 96 mmol/L (ref 96–106)
Creatinine, Ser: 1.07 mg/dL (ref 0.76–1.27)
Globulin, Total: 2.6 g/dL (ref 1.5–4.5)
Glucose: 65 mg/dL — ABNORMAL LOW (ref 70–99)
Potassium: 5.1 mmol/L (ref 3.5–5.2)
Sodium: 136 mmol/L (ref 134–144)
Total Protein: 6.8 g/dL (ref 6.0–8.5)
eGFR: 87 mL/min/{1.73_m2} (ref >59)

## 2023-07-08 LAB — LIPID PANEL
Chol/HDL Ratio: 9.1 {ratio} — ABNORMAL HIGH (ref 0.0–5.0)
Cholesterol, Total: 192 mg/dL (ref 100–199)
HDL: 21 mg/dL — ABNORMAL LOW (ref >39)
LDL Chol Calc (NIH): 141 mg/dL — ABNORMAL HIGH (ref 0–99)
Triglycerides: 163 mg/dL — ABNORMAL HIGH (ref 0–149)
VLDL Cholesterol Cal: 30 mg/dL (ref 5–40)

## 2023-07-11 NOTE — Addendum Note (Signed)
Addended by: Allyn Bertoni E on: 07/11/2023 10:27 AM   Modules accepted: Orders

## 2023-07-14 ENCOUNTER — Encounter: Payer: Self-pay | Admitting: Dermatology

## 2023-07-15 ENCOUNTER — Encounter: Payer: Self-pay | Admitting: Family Medicine

## 2023-07-15 DIAGNOSIS — Z9189 Other specified personal risk factors, not elsewhere classified: Secondary | ICD-10-CM

## 2023-07-19 NOTE — Telephone Encounter (Signed)
Yes, we can send a refill for doxycycline. 100mg  daily for 1 month.  Thanks!

## 2023-07-20 ENCOUNTER — Other Ambulatory Visit: Payer: Self-pay

## 2023-07-20 DIAGNOSIS — L739 Follicular disorder, unspecified: Secondary | ICD-10-CM

## 2023-07-20 MED ORDER — DOXYCYCLINE HYCLATE 100 MG PO CAPS: ORAL_CAPSULE | ORAL | 1 refills | Status: DC

## 2023-07-21 ENCOUNTER — Telehealth: Payer: Self-pay | Admitting: Internal Medicine

## 2023-07-21 NOTE — Telephone Encounter (Signed)
.  SYNCOPECHMG   Pt c/o Syncope: STAT if syncope occurred within 24 hours and pt complains of lightheadedness.   High Priority if episode of passing out, completely, today or in last 24 hours   1. Did you pass out today? Yes   2. When is the last time you passed out? Today   3. Has this occurred multiple times? No  4. Did you have any symptoms prior to passing out? Light headed  5. Did you fall? If so, are you on a blood thinner? Yes ( Aspirin)

## 2023-07-21 NOTE — Telephone Encounter (Signed)
Pt calling in to report syncopal episode about an hour ago while dead lifting at the gym.  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 He did not hit his head. Calling b/c he thinks it may be related to new medication/s. He has been light headed on lifts since starting new medications in the last several months.  But he is not sure which might be the culprit. Has been taking sudafed for a cold this past week. Unsure of what his BP has been, but normal for him is "135/80". His Garmin watch showed HR 75 this morning, and he reports that he "typically runs upper 90s to 100s".  Forwarding to MD for review/advisement.

## 2023-07-22 MED ORDER — BISOPROLOL FUMARATE 10 MG PO TABS: 5.0000 mg | ORAL_TABLET | Freq: Every day | ORAL | Status: DC

## 2023-07-22 NOTE — Telephone Encounter (Signed)
Attempted phone call to pt. OK per Epic to leave detailed message.  Pt advised per Dr Izora Ribas, recently had increase in bisoprolol for HF up titration.  Decrease back to 5 mg.  Caution with Valsalva maneuvers is warranted.  If further episodes needs DOD visit or ED visit based on severity.

## 2023-07-24 ENCOUNTER — Other Ambulatory Visit: Payer: Self-pay | Admitting: Family Medicine

## 2023-07-24 DIAGNOSIS — G63 Polyneuropathy in diseases classified elsewhere: Secondary | ICD-10-CM

## 2023-07-29 ENCOUNTER — Encounter (HOSPITAL_COMMUNITY): Payer: Self-pay

## 2023-07-29 ENCOUNTER — Encounter (HOSPITAL_BASED_OUTPATIENT_CLINIC_OR_DEPARTMENT_OTHER): Payer: Self-pay | Admitting: Emergency Medicine

## 2023-07-29 ENCOUNTER — Emergency Department (HOSPITAL_BASED_OUTPATIENT_CLINIC_OR_DEPARTMENT_OTHER)
Admission: EM | Admit: 2023-07-29 | Discharge: 2023-07-30 | Disposition: A | Discharge status: Home / Self Care | Payer: 59 | Attending: Emergency Medicine | Admitting: Emergency Medicine

## 2023-07-29 ENCOUNTER — Emergency Department (HOSPITAL_COMMUNITY)
Admission: EM | Admit: 2023-07-29 | Discharge: 2023-07-29 | Discharge status: Against Medical Advice | Payer: 59 | Attending: Emergency Medicine | Admitting: Emergency Medicine

## 2023-07-29 ENCOUNTER — Other Ambulatory Visit: Payer: Self-pay

## 2023-07-29 DIAGNOSIS — Z7982 Long term (current) use of aspirin: Secondary | ICD-10-CM | POA: Insufficient documentation

## 2023-07-29 DIAGNOSIS — I10 Essential (primary) hypertension: Secondary | ICD-10-CM | POA: Insufficient documentation

## 2023-07-29 DIAGNOSIS — R040 Epistaxis: Secondary | ICD-10-CM | POA: Insufficient documentation

## 2023-07-29 DIAGNOSIS — Z79899 Other long term (current) drug therapy: Secondary | ICD-10-CM | POA: Insufficient documentation

## 2023-07-29 DIAGNOSIS — Z5321 Procedure and treatment not carried out due to patient leaving prior to being seen by health care provider: Secondary | ICD-10-CM | POA: Insufficient documentation

## 2023-07-29 DIAGNOSIS — Z8571 Personal history of Hodgkin lymphoma: Secondary | ICD-10-CM | POA: Insufficient documentation

## 2023-07-29 DIAGNOSIS — K219 Gastro-esophageal reflux disease without esophagitis: Secondary | ICD-10-CM | POA: Diagnosis not present

## 2023-07-29 MED ORDER — OXYMETAZOLINE HCL 0.05 % NA SOLN
1.0000 | Freq: Once | NASAL | Status: AC
  Administered 2023-07-29: 1 via NASAL
  Filled 2023-07-29: qty 30

## 2023-07-29 NOTE — ED Triage Notes (Signed)
  Pt came in via POV d/t a nose bleed that started around 1645 after he scratched his nose (Rt nare) once he returned home from the gym. Pt report it has stopped bleeding is why he came in for eval. Denies lightheaded or dizziness. A/Ox4, denies much pain other than the area where is bleeding     Lbws from [facility], stopped bleeding went home, then started bleeding again.  Packed by patient. Takes daily asa

## 2023-07-29 NOTE — ED Notes (Signed)
Pt left d/t wait time

## 2023-07-29 NOTE — ED Triage Notes (Signed)
Pt came in via POV d/t a nose bleed that started around 1645 after he scratched his nose (Rt nare) once he returned home from the gym. Pt report it has stopped bleeding is why he came in for eval. Denies lightheaded or dizziness. A/Ox4, denies much pain other than the area where is bleeding.

## 2023-07-30 DIAGNOSIS — S46012A Strain of muscle(s) and tendon(s) of the rotator cuff of left shoulder, initial encounter: Secondary | ICD-10-CM | POA: Diagnosis not present

## 2023-07-30 LAB — CBC
HCT: 46 % (ref 39.0–52.0)
Hemoglobin: 16.4 g/dL (ref 13.0–17.0)
MCH: 32.4 pg (ref 26.0–34.0)
MCHC: 35.7 g/dL (ref 30.0–36.0)
MCV: 90.9 fL (ref 80.0–100.0)
Platelets: 302 10*3/uL (ref 150–400)
RBC: 5.06 MIL/uL (ref 4.22–5.81)
RDW: 23.8 % — ABNORMAL HIGH (ref 11.5–15.5)
WBC: 10.2 10*3/uL (ref 4.0–10.5)
nRBC: 0.2 % (ref 0.0–0.2)

## 2023-07-30 NOTE — ED Notes (Signed)
Pt. States his nose( right nare only) bled from 1630 to 2330 tonight. Packed with gauze in triage.

## 2023-07-30 NOTE — ED Provider Notes (Signed)
[place] EMERGENCY DEPARTMENT AT [facility] Provider Note   CSN: [id number] Arrival date & time: 07/29/23  2216     History  Chief Complaint  Patient presents with   Epistaxis    Troy [name] is a 47 y.o. male.  Patient is a 47 year old male with past medical history of Hodgkin's lymphoma as a child, hypertension, GERD, pulmonary fibrosis.  Patient presenting today for evaluation of nosebleed.  He reports coming home from the gym this afternoon.  He then scratched his nose and it began bleeding.  He had difficulty getting it to stop and packed it at home.  He then went to [facility] where he sat in the waiting room for several hours, then came here.  He was given Afrin in triage and the bleeding has since stopped.  Patient does take a full-strength aspirin daily, but is not on stronger blood thinners.  The history is provided by the patient.       Home Medications Prior to Admission medications   Medication Sig Start Date End Date Taking? Authorizing Provider  albuterol (PROVENTIL) (2.5 MG/3ML) 0.083% nebulizer solution Take 3 mLs (2.5 mg total) by nebulization every 4 (four) hours as needed for wheezing or shortness of breath. 05/13/23   Alfonse Spruce, MD  Albuterol-Budesonide 90-80 MCG/ACT AERO Inhale 2 Inhalations into the lungs every 4 (four) hours as needed. Maximum 12 inhalations per day. 03/29/23   Loyola Mast, MD  ammonium lactate (LAC-HYDRIN) 12 % lotion Apply 1 application topically as needed for dry skin.    [provider]  aspirin 325 MG tablet Take 1 tablet (325 mg total) by mouth daily. 07/18/15   Ghimire, Werner Lean, MD  Azelastine HCl 137 MCG/SPRAY SOLN USE 1 SPRAY(S) IN EACH NOSTRIL ONCE DAILY AS DIRECTED 06/09/23   Alfonse Spruce, MD  baclofen (LIORESAL) 10 MG tablet Take 1 tablet by mouth three times daily as needed 05/10/22   Loyola Mast, MD  bisoprolol (ZEBETA) 10 MG tablet Take 0.5 tablets (5 mg total) by mouth at  bedtime. 07/22/23   Chandrasekhar, Mahesh A, MD  budesonide (PULMICORT) 0.5 MG/2ML nebulizer solution Mix with Xopenex nebulizer solution every 6 hours as needed. 04/08/23   Alfonse Spruce, MD  cimetidine (TAGAMET) 200 MG tablet Take 1 tablet daily Patient not taking: Reported on 05/31/2023 05/24/23   Terri Piedra, DO  clindamycin (CLEOCIN T) 1 % lotion Apply topically as needed (rash). 05/25/22   [provider]  dapagliflozin propanediol (FARXIGA) 10 MG TABS tablet Take 1 tablet (10 mg total) by mouth daily before breakfast. 06/03/23   Riley Lam A, MD  doxycycline (VIBRAMYCIN) 100 MG capsule Take 1 with food once a day for 1 month 07/20/23   Terri Piedra, DO  EPINEPHrine 0.3 mg/0.3 mL IJ SOAJ injection Inject 0.3 mg into the muscle as needed for anaphylaxis. 04/07/23   Alfonse Spruce, MD  esomeprazole (NEXIUM) 40 MG capsule TAKE 1 CAPSULE BY MOUTH TWICE DAILY BEFORE A MEAL 01/17/23   Loyola Mast, MD  Evolocumab (REPATHA SURECLICK) 140 MG/ML SOAJ Inject 140 mg into the skin every 14 (fourteen) days. Patient not taking: Reported on 05/31/2023 05/20/23   Christell Constant, MD  fluticasone Aleda Grana) 50 MCG/ACT nasal spray Use 1 spray(s) in each nostril once daily Patient not taking: Reported on 05/31/2023 03/08/22   Loyola Mast, MD  Fluticasone-Umeclidin-Vilant (TRELEGY ELLIPTA) 200-62.5-25 MCG/ACT AEPB INHALE 1 PUFF INTO LUNGS ONCE DAILY 06/07/23  Alfonse Spruce, MD  furosemide (LASIX) 20 MG tablet Take 1 tablet (20 mg total) by mouth 3 (three) times a week. 12/31/22   Christell Constant, MD  gabapentin (NEURONTIN) 300 MG capsule TAKE 2 Casp am, 2 caps noon, 4 caps bedtime 07/25/23   Loyola Mast, MD  hydrOXYzine (VISTARIL) 50 MG capsule Take 1 capsule (50 mg total) by mouth at bedtime. 02/02/21   Loyola Mast, MD  levalbuterol [facility]) 45 MCG/ACT inhaler Inhale 2 puffs into the lungs every 4 (four) hours as needed for wheezing.  05/31/23   Alfonse Spruce, MD  levalbuterol Pauline Aus) 1.25 MG/3ML nebulizer solution Take 1.25 mg by nebulization every 4 (four) hours as needed for wheezing. 04/08/23   Alfonse Spruce, MD  levothyroxine (SYNTHROID) 300 MCG tablet Take 300 mcg by mouth daily before breakfast.    [provider]  LUER LOCK SAFETY SYRINGES 22G X 1-1/2" 3 ML MISC USE FOR SELF ADMINISTER OF TESTOSTERONE 2CC ONCE EVERY 2 WEEKS 09/30/20   [provider]  Multiple Vitamin (MULTI-VITAMIN) tablet Take 1 tablet by mouth daily. 07/24/09   [provider]  predniSONE (DELTASONE) 10 MG tablet Take 3 tabs (30mg ) twice daily for 3 days, then 2 tabs (20mg ) twice daily for 3 days, then 1 tab (10mg ) twice daily for 3 days, then STOP. 06/28/23   Alfonse Spruce, MD  rosuvastatin (CRESTOR) 40 MG tablet Take 1 tablet by mouth once daily 03/20/23   Loyola Mast, MD  sacubitril-valsartan (ENTRESTO) 49-51 MG Take 1 tablet by mouth 2 (two) times daily. 05/20/23   Christell Constant, MD  Suvorexant (BELSOMRA) 10 MG TABS TAKE 1 TABLET BY MOUTH AT BEDTIME 05/22/23   Loyola Mast, MD  tamsulosin [facility]) 0.4 MG CAPS capsule Take 1 capsule by mouth once daily 09/18/22   Loyola Mast, MD  testosterone cypionate (DEPOTESTOSTERONE CYPIONATE) 200 MG/ML injection Inject 0.6 mLs (120 mg total) into the muscle once a week. 06/02/22   Loyola Mast, MD  traZODone (DESYREL) 150 MG tablet TAKE 2 TABLETS BY MOUTH AT BEDTIME 03/21/23   Loyola Mast, MD  VENTOLIN HFA 108 [phone] Base) MCG/ACT inhaler INHALE 2 PUFFS BY MOUTH EVERY 6 HOURS AS NEEDED FOR SHORTNESS OF BREATH 04/08/23   Alfonse Spruce, MD      Allergies    Ciprofloxacin, Earnestine Leys officinalis], Fluocinolone, and Prednisone    Review of Systems   Review of Systems  All other systems reviewed and are negative.   Physical Exam Updated Vital Signs BP (!) 146/99 (BP Location: Right Arm)   Pulse 96   Temp 98.2 F (36.8 C)   Resp  18   SpO2 98%  Physical Exam Vitals and nursing note reviewed.  Constitutional:      Appearance: Normal appearance.  HENT:     Head: Normocephalic.     Nose:     Comments: There is no active bleeding noted.  I am unable to visualize the site of the bleeding. Pulmonary:     Effort: Pulmonary effort is normal.  Skin:    General: Skin is warm and dry.  Neurological:     Mental Status: He is alert and oriented to person, place, and time.     ED Results / Procedures / Treatments   Labs (all labs ordered are listed, but only abnormal results are displayed) Labs Reviewed  CBC - Abnormal; Notable for the following components:      Result Value  RDW 23.8 (*)    All other components within normal limits    EKG None  Radiology No results found.  Procedures Procedures    Medications Ordered in ED Medications  oxymetazoline (AFRIN) 0.05 % nasal spray 1 spray (1 spray Each Nare Given 07/29/23 2237)    ED Course/ Medical Decision Making/ A&P  Patient presenting with nosebleed as described in the HPI.  Bleeding has resolved after receiving Afrin in triage.  Patient did have a CBC obtained which revealed no significant abnormalities.  Platelet count is normal.  Patient requesting to go home.  I see nothing to cauterize and no indication at this time for a packing.  Patient will be given Afrin to take home and advised to apply direct pressure if bleeding resumes.  Final Clinical Impression(s) / ED Diagnoses Final diagnoses:  None    Rx / DC Orders ED Discharge Orders     None         Geoffery Lyons, MD 07/30/23 0030

## 2023-07-30 NOTE — Discharge Instructions (Signed)
If bleeding resumes, administer 2 sprays of the Afrin followed by direct pressure.  If unable to get the bleeding under control, return to the ER for reevaluation.

## 2023-08-02 ENCOUNTER — Telehealth: Payer: Self-pay

## 2023-08-02 NOTE — Transitions of Care (Post Inpatient/ED Visit) (Signed)
08/02/2023  Name: Troy [name] MRN: [id number] DOB: June 20, 1976  Today's TOC FU Call Status: Today's TOC FU Call Status:: Unsuccessful Call (1st Attempt) Unsuccessful Call (1st Attempt) Date: 08/02/23  Attempted to reach the patient regarding the most recent Inpatient/ED visit.  Follow Up Plan: Additional outreach attempts will be made to reach the patient to complete the Transitions of Care (Post Inpatient/ED visit) call.   Signature  Veva Grimley D

## 2023-08-03 ENCOUNTER — Telehealth: Payer: Self-pay

## 2023-08-03 NOTE — Transitions of Care (Post Inpatient/ED Visit) (Signed)
08/03/2023  Name: KYDON LINGERFELT MRN: [id number] DOB: Feb 18, 1976  Today's TOC FU Call Status: Today's TOC FU Call Status:: Unsuccessful Call (2nd Attempt) Unsuccessful Call (2nd Attempt) Date: 08/03/23  Attempted to reach the patient regarding the most recent Inpatient/ED visit.  Follow Up Plan: Additional outreach attempts will be made to reach the patient to complete the Transitions of Care (Post Inpatient/ED visit) call.   Signature  Early Ord D, cma

## 2023-08-05 ENCOUNTER — Encounter: Payer: Self-pay | Admitting: Family Medicine

## 2023-08-05 ENCOUNTER — Ambulatory Visit (INDEPENDENT_AMBULATORY_CARE_PROVIDER_SITE_OTHER): Payer: 59 | Admitting: Family Medicine

## 2023-08-05 ENCOUNTER — Telehealth: Payer: Self-pay | Admitting: Family Medicine

## 2023-08-05 VITALS — BP 142/76 | HR 73 | Temp 97.5°F | Wt 215.8 lb

## 2023-08-05 DIAGNOSIS — N492 Inflammatory disorders of scrotum: Secondary | ICD-10-CM

## 2023-08-05 MED ORDER — CLINDAMYCIN HCL 300 MG PO CAPS: 300.0000 mg | ORAL_CAPSULE | Freq: Four times a day (QID) | ORAL | 0 refills | Status: AC

## 2023-08-05 MED ORDER — TRAMADOL HCL 50 MG PO TABS
50.0000 mg | ORAL_TABLET | Freq: Three times a day (TID) | ORAL | 0 refills | Status: AC | PRN
Start: 2023-08-05 — End: 2023-08-10

## 2023-08-05 NOTE — Progress Notes (Unsigned)
Assessment/Plan:   Problem List Items Addressed This Visit   None   There are no discontinued medications.  No follow-ups on file.    Subjective:   Encounter date: 08/05/2023  Troy [name] is a 47 y.o. male who has Substance induced mood disorder (HCC); History of stroke; Moderate persistent asthma, uncomplicated; History of Hodgkin's lymphoma; Chronic rhinitis; Not currently working due to disabled status; Pain in joint of left knee; Chronic insomnia; Arthralgia of left elbow; Autonomic dysfunction; History of basal cell carcinoma (BCC); Daytime somnolence; GERD (gastroesophageal reflux disease); Essential hypertension; History of thyroid cancer; Iron deficiency anemia due to chronic blood loss; Large liver; Microalbuminuria; Prediabetes; History of splenectomy; Postoperative hypothyroidism; Erectile dysfunction; Male hypogonadism; Pulmonary fibrosis (HCC); Peripheral neuropathy; History of hepatitis C; Back pain; Hyperlipidemia; 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  (07/16/2021), Chronic fatigue, Drug abuse (HCC), Family history of breast cancer (05/19/2021), Family history of colon cancer in mother, GERD (gastroesophageal reflux disease), Hematochezia, Hepatic steatosis, History of CVA (cerebrovascular accident), History of hepatitis C, History of Hodgkin's lymphoma, History of splenectomy,  Hyperlipidemia, Hypertension, Insomnia, Male hypogonadism, Moderate persistent asthma, Obesity, Class I, BMI 30-34.9, Papillary thyroid carcinoma (HCC) (2010), Postsurgical hypothyroidism, Prediabetes, and Pulmonary fibrosis (HCC).Marland Kitchen   He presents with chief complaint of Medical Management of Chronic Issues (Cyst on scrotum. ) .   HPI:   ROS  Past Surgical History:  Procedure Laterality Date  . COLONOSCOPY  07/04/2019   done for hematochezia and FH col ca-mother: Mod int hem w/inflamm.  No adenomatous polyps.  Troy [name] HERNIA REPAIR  06/2019   RUQ (prev lap append site)  . LAPAROSCOPIC APPENDECTOMY N/A 03/25/2017   Procedure: APPENDECTOMY LAPAROSCOPIC; EXTENSIVE LYSIS OF ADHESIONS FROM PRIOR RADIATION AND SURGERY;  Surgeon: Troy Murphy, MD;  Location: [facility];  Service: General;  Laterality: N/A;  . PARATHYROIDECTOMY    . SPLENECTOMY    . TEE WITHOUT CARDIOVERSION N/A 07/18/2015   Procedure: TRANSESOPHAGEAL ECHOCARDIOGRAM (TEE);  Surgeon: Troy Mixer, MD;  Location: [facility];  Service: Cardiovascular;  Laterality: N/A;  . THYROIDECTOMY      Outpatient Medications Prior to Visit  Medication Sig Dispense Refill  . albuterol (PROVENTIL) (2.5 MG/3ML) 0.083% nebulizer solution Take 3 mLs (2.5 mg total) by nebulization every 4 (four) hours as needed for wheezing or shortness of breath. 75 mL 1  . Albuterol-Budesonide 90-80 MCG/ACT AERO Inhale 2 Inhalations into the lungs every 4 (four) hours as needed. Maximum 12 inhalations per day. 10.7 g 2  . ammonium lactate (LAC-HYDRIN) 12 % lotion Apply 1 application topically as needed for dry skin.    Marland Kitchen aspirin 325 MG tablet Take 1 tablet (325 mg total) by mouth daily. 30 tablet 0  . Azelastine HCl 137 MCG/SPRAY SOLN USE 1 SPRAY(S) IN EACH NOSTRIL ONCE DAILY AS DIRECTED 30 mL 5  . baclofen (LIORESAL) 10 MG tablet Take 1 tablet by mouth three times daily as needed 30 tablet 5  . bisoprolol (ZEBETA) 10 MG tablet Take 0.5 tablets (5 mg  total) by  mouth at bedtime.    . budesonide (PULMICORT) 0.5 MG/2ML nebulizer solution Mix with Xopenex nebulizer solution every 6 hours as needed. 120 mL 2  . clindamycin (CLEOCIN T) 1 % lotion Apply topically as needed (rash).    . dapagliflozin propanediol (FARXIGA) 10 MG TABS tablet Take 1 tablet (10 mg total) by mouth daily before breakfast. 90 tablet 3  . doxycycline (VIBRAMYCIN) 100 MG capsule Take 1 with food once a day for 1 month 30 capsule 1  . EPINEPHrine 0.3 mg/0.3 mL IJ SOAJ injection Inject 0.3 mg into the muscle as needed for anaphylaxis. 2 each 2  . esomeprazole (NEXIUM) 40 MG capsule TAKE 1 CAPSULE BY MOUTH TWICE DAILY BEFORE A MEAL 180 capsule 3  . Evolocumab (REPATHA SURECLICK) 140 MG/ML SOAJ Inject 140 mg into the skin every 14 (fourteen) days. 6 mL 3  . Fluticasone-Umeclidin-Vilant (TRELEGY ELLIPTA) 200-62.5-25 MCG/ACT AEPB INHALE 1 PUFF INTO LUNGS ONCE DAILY 60 each 5  . furosemide (LASIX) 20 MG tablet Take 1 tablet (20 mg total) by mouth 3 (three) times a week. 36 tablet 3  . gabapentin (NEURONTIN) 300 MG capsule TAKE 2 Casp am, 2 caps noon, 4 caps bedtime 240 capsule 3  . hydrOXYzine (VISTARIL) 50 MG capsule Take 1 capsule (50 mg total) by mouth at bedtime. 90 capsule 3  . levalbuterol (XOPENEX HFA) 45 MCG/ACT inhaler Inhale 2 puffs into the lungs every 4 (four) hours as needed for wheezing. 2 each 4  . levalbuterol (XOPENEX) 1.25 MG/3ML nebulizer solution Take 1.25 mg by nebulization every 4 (four) hours as needed for wheezing. 120 mL 1  . levothyroxine (SYNTHROID) 300 MCG tablet Take 300 mcg by mouth daily before breakfast.    . LUER LOCK SAFETY SYRINGES 22G X 1-1/2" 3 ML MISC USE FOR SELF ADMINISTER OF TESTOSTERONE 2CC ONCE EVERY 2 WEEKS    . Multiple Vitamin (MULTI-VITAMIN) tablet Take 1 tablet by mouth daily.    . rosuvastatin (CRESTOR) 40 MG tablet Take 1 tablet by mouth once daily 90 tablet 3  . sacubitril-valsartan (ENTRESTO) 49-51 MG Take 1 tablet by mouth 2 (two)  times daily. 60 tablet 3  . Suvorexant (BELSOMRA) 10 MG TABS TAKE 1 TABLET BY MOUTH AT BEDTIME 30 tablet 2  . tamsulosin (FLOMAX) 0.4 MG CAPS capsule Take 1 capsule by mouth once daily 30 capsule 11  . testosterone cypionate (DEPOTESTOSTERONE CYPIONATE) 200 MG/ML injection Inject 0.6 mLs (120 mg total) into the muscle once a week. 10 mL 0  . traZODone (DESYREL) 150 MG tablet TAKE 2 TABLETS BY MOUTH AT BEDTIME 180 tablet 3  . VENTOLIN HFA 108 (90 Base) MCG/ACT inhaler INHALE 2 PUFFS BY MOUTH EVERY 6 HOURS AS NEEDED FOR SHORTNESS OF BREATH 18 g 1  . cimetidine (TAGAMET) 200 MG tablet Take 1 tablet daily (Patient not taking: Reported on 05/31/2023) 30 tablet 2  . fluticasone (FLONASE) 50 MCG/ACT nasal spray Use 1 spray(s) in each nostril once daily (Patient not taking: Reported on 05/31/2023) 16 g 11  . predniSONE (DELTASONE) 10 MG tablet Take 3 tabs (30mg ) twice daily for 3 days, then 2 tabs (20mg ) twice daily for 3 days, then 1 tab (10mg ) twice daily for 3 days, then STOP. (Patient not taking: Reported on 08/05/2023) 36 tablet 0   No facility-administered medications prior to visit.    Family History  Problem Relation Age of Onset  . Breast cancer Mother        dx early 20s  . Colon cancer Mother  dx early 46s  . Other Mother        NTHL1 homozygous  . Parkinsonism Father   . Multiple myeloma Father        dx 33s  . Breast cancer Maternal Aunt   . Stroke Maternal Grandmother   . Leukemia Other        dx <20; two paternal first cousins once removed    Social History   Socioeconomic History  . Marital status: Single    Spouse name: Not on file  . Number of children: 0  . Years of education: Not on file  . Highest education level: Not on file  Occupational History  . Not on file  Tobacco Use  . Smoking status: Former    Current packs/day: 0.00    Types: Cigarettes    Quit date: 2013    Years since quitting: 11.8  . Smokeless tobacco: Never  Vaping Use  . Vaping status:  Never Used  Substance and Sexual Activity  . Alcohol use: Not Currently    Comment: occasionally  . Drug use: Yes    Types: Marijuana  . Sexual activity: Yes  Other Topics Concern  . Not on file  Social History Narrative   Are you right handed or left handed? Right   Are you currently employed ? No   What is your current occupation? None   Do you live at home alone? yes   Who lives with you? no   What type of home do you live in: 1 story or 2 story? Third floor apratment      Social Determinants of Health   Financial Resource Strain: Not on file  Food Insecurity: Low Risk  (12/17/2022)   Received from [facility]   Hunger Vital Sign   . Worried About Programme researcher, broadcasting/film/video in the Last Year: Never true   . Ran Out of Food in the Last Year: Never true  Transportation Needs: No Transportation Needs (12/17/2022)   Received from [facility]   Transportation   . In the past 12 months, has lack of reliable transportation kept you from medical appointments, meetings, work or from getting things needed for daily living? : No  Physical Activity: Not on file  Stress: Not on file  Social Connections: Not on file  Intimate Partner Violence: Not on file                                                                                                  Objective:  Physical Exam: BP (!) 142/76 (BP Location: Left Arm, Patient Position: Sitting, Cuff Size: Large)   Pulse 73   Temp (!) 97.5 F (36.4 C) (Temporal)   Wt 215 lb 12.8 oz (97.9 kg)   SpO2 97%   BMI 29.27 kg/m     Physical Exam  No results found.  Recent Results (from the past 2160 hour(s))  Basic metabolic panel     Status: Abnormal   Collection Time: 06/03/23 12:25 PM  Result Value Ref Range   Glucose 102 (H) 70 - 99  mg/dL   BUN 23 6 - 24 mg/dL   Creatinine, Ser 2.95 0.76 - 1.27 mg/dL   eGFR 93 >28 UX/LKG/4.01   BUN/Creatinine Ratio 23 (H) 9 - 20   Sodium 134 134 - 144 mmol/L    Potassium 5.4 (H) 3.5 - 5.2 mmol/L   Chloride 100 96 - 106 mmol/L   CO2 21 20 - 29 mmol/L   Calcium 9.4 8.7 - 10.2 mg/dL  CBC     Status: Abnormal   Collection Time: 06/03/23 12:25 PM  Result Value Ref Range   WBC 8.5 3.4 - 10.8 x10E3/uL   RBC 5.32 4.14 - 5.80 x10E6/uL   Hemoglobin 17.5 13.0 - 17.7 g/dL   Hematocrit 02.7 (H) 25.3 - 51.0 %   MCV 102 (H) 79 - 97 fL   MCH 32.9 26.6 - 33.0 pg   MCHC 32.1 31.5 - 35.7 g/dL   RDW 66.4 40.3 - 47.4 %   Platelets 223 150 - 450 x10E3/uL  Basic metabolic panel     Status: Abnormal   Collection Time: 06/09/23 11:42 AM  Result Value Ref Range   Glucose 91 70 - 99 mg/dL   BUN 25 (H) 6 - 24 mg/dL   Creatinine, Ser 2.59 0.76 - 1.27 mg/dL   eGFR 77 >56 LO/VFI/4.33   BUN/Creatinine Ratio 21 (H) 9 - 20   Sodium 139 134 - 144 mmol/L   Potassium 5.0 3.5 - 5.2 mmol/L   Chloride 104 96 - 106 mmol/L   CO2 19 (L) 20 - 29 mmol/L   Calcium 9.4 8.7 - 10.2 mg/dL  Lipid panel     Status: Abnormal   Collection Time: 07/08/23 10:02 AM  Result Value Ref Range   Cholesterol, Total 192 100 - 199 mg/dL   Triglycerides 295 (H) 0 - 149 mg/dL   HDL 21 (L) >18 mg/dL   VLDL Cholesterol Cal 30 5 - 40 mg/dL   LDL Chol Calc (NIH) 841 (H) 0 - 99 mg/dL   Chol/HDL Ratio 9.1 (H) 0.0 - 5.0 ratio    Comment:                                   T. Chol/HDL Ratio                                             Men  Women                               1/2 Avg.Risk  3.4    3.3                                   Avg.Risk  5.0    4.4                                2X Avg.Risk  9.6    7.1                                3X Avg.Risk 23.4   11.0   Comp Met (CMET)  Status: Abnormal   Collection Time: 07/08/23 10:02 AM  Result Value Ref Range   Glucose 65 (L) 70 - 99 mg/dL   BUN 25 (H) 6 - 24 mg/dL   Creatinine, Ser 1.61 0.76 - 1.27 mg/dL   eGFR 87 >09 UE/AVW/0.98   BUN/Creatinine Ratio 23 (H) 9 - 20   Sodium 136 134 - 144 mmol/L   Potassium 5.1 3.5 - 5.2 mmol/L   Chloride 96  96 - 106 mmol/L   CO2 24 20 - 29 mmol/L   Calcium 9.3 8.7 - 10.2 mg/dL   Total Protein 6.8 6.0 - 8.5 g/dL   Albumin 4.2 4.1 - 5.1 g/dL   Globulin, Total 2.6 1.5 - 4.5 g/dL   Bilirubin Total 1.0 0.0 - 1.2 mg/dL   Alkaline Phosphatase 99 44 - 121 IU/L   AST 140 (H) 0 - 40 IU/L   ALT 186 (H) 0 - 44 IU/L  CBC     Status: Abnormal   Collection Time: 07/29/23 10:31 PM  Result Value Ref Range   WBC 10.2 4.0 - 10.5 K/uL   RBC 5.06 4.22 - 5.81 MIL/uL   Hemoglobin 16.4 13.0 - 17.0 g/dL   HCT 11.9 14.7 - 82.9 %   MCV 90.9 80.0 - 100.0 fL   MCH 32.4 26.0 - 34.0 pg   MCHC 35.7 30.0 - 36.0 g/dL   RDW 56.2 (H) 13.0 - 86.5 %   Platelets 302 150 - 400 K/uL   nRBC 0.2 0.0 - 0.2 %    Comment: Performed at [facility], [address] [place], [place] [address]        Garner Nash, MD, MS

## 2023-08-06 ENCOUNTER — Emergency Department (HOSPITAL_BASED_OUTPATIENT_CLINIC_OR_DEPARTMENT_OTHER): Payer: 59

## 2023-08-06 ENCOUNTER — Other Ambulatory Visit: Payer: Self-pay

## 2023-08-06 ENCOUNTER — Encounter (HOSPITAL_BASED_OUTPATIENT_CLINIC_OR_DEPARTMENT_OTHER): Payer: Self-pay

## 2023-08-06 ENCOUNTER — Emergency Department (HOSPITAL_BASED_OUTPATIENT_CLINIC_OR_DEPARTMENT_OTHER)
Admission: EM | Admit: 2023-08-06 | Discharge: 2023-08-06 | Disposition: A | Discharge status: Home / Self Care | Payer: 59 | Attending: Emergency Medicine | Admitting: Emergency Medicine

## 2023-08-06 DIAGNOSIS — N492 Inflammatory disorders of scrotum: Secondary | ICD-10-CM | POA: Diagnosis present

## 2023-08-06 DIAGNOSIS — Z7984 Long term (current) use of oral hypoglycemic drugs: Secondary | ICD-10-CM | POA: Insufficient documentation

## 2023-08-06 DIAGNOSIS — R160 Hepatomegaly, not elsewhere classified: Secondary | ICD-10-CM | POA: Diagnosis not present

## 2023-08-06 DIAGNOSIS — I700 Atherosclerosis of aorta: Secondary | ICD-10-CM | POA: Insufficient documentation

## 2023-08-06 DIAGNOSIS — E119 Type 2 diabetes mellitus without complications: Secondary | ICD-10-CM | POA: Diagnosis not present

## 2023-08-06 DIAGNOSIS — Z23 Encounter for immunization: Secondary | ICD-10-CM | POA: Insufficient documentation

## 2023-08-06 DIAGNOSIS — Z7982 Long term (current) use of aspirin: Secondary | ICD-10-CM | POA: Diagnosis not present

## 2023-08-06 DIAGNOSIS — D72829 Elevated white blood cell count, unspecified: Secondary | ICD-10-CM | POA: Insufficient documentation

## 2023-08-06 LAB — CBC WITH DIFFERENTIAL/PLATELET
Abs Immature Granulocytes: 0.18 10*3/uL — ABNORMAL HIGH (ref 0.00–0.07)
Basophils Absolute: 0.1 10*3/uL (ref 0.0–0.1)
Basophils Relative: 0 %
Eosinophils Absolute: 0.1 10*3/uL (ref 0.0–0.5)
Eosinophils Relative: 0 %
HCT: 44.5 % (ref 39.0–52.0)
Hemoglobin: 16 g/dL (ref 13.0–17.0)
Immature Granulocytes: 1 %
Lymphocytes Relative: 12 %
Lymphs Abs: 1.7 10*3/uL (ref 0.7–4.0)
MCH: 32.8 pg (ref 26.0–34.0)
MCHC: 36 g/dL (ref 30.0–36.0)
MCV: 91.2 fL (ref 80.0–100.0)
Monocytes Absolute: 1.7 10*3/uL — ABNORMAL HIGH (ref 0.1–1.0)
Monocytes Relative: 13 %
Neutro Abs: 10 10*3/uL — ABNORMAL HIGH (ref 1.7–7.7)
Neutrophils Relative %: 74 %
Platelets: 246 10*3/uL (ref 150–400)
RBC: 4.88 MIL/uL (ref 4.22–5.81)
RDW: 23.5 % — ABNORMAL HIGH (ref 11.5–15.5)
WBC: 13.8 10*3/uL — ABNORMAL HIGH (ref 4.0–10.5)
nRBC: 0.1 % (ref 0.0–0.2)

## 2023-08-06 LAB — BASIC METABOLIC PANEL
Anion gap: 7 (ref 5–15)
BUN: 22 mg/dL — ABNORMAL HIGH (ref 6–20)
CO2: 24 mmol/L (ref 22–32)
Calcium: 9 mg/dL (ref 8.9–10.3)
Chloride: 102 mmol/L (ref 98–111)
Creatinine, Ser: 0.91 mg/dL (ref 0.61–1.24)
GFR, Estimated: >60 mL/min (ref >60)
Glucose, Bld: 100 mg/dL — ABNORMAL HIGH (ref 70–99)
Potassium: 4.6 mmol/L (ref 3.5–5.1)
Sodium: 133 mmol/L — ABNORMAL LOW (ref 135–145)

## 2023-08-06 MED ORDER — MORPHINE SULFATE (PF) 4 MG/ML IV SOLN
4.0000 mg | Freq: Once | INTRAVENOUS | Status: AC
  Administered 2023-08-06: 4 mg via INTRAVENOUS
  Filled 2023-08-06: qty 1

## 2023-08-06 MED ORDER — OXYCODONE-ACETAMINOPHEN 5-325 MG PO TABS
1.0000 | ORAL_TABLET | Freq: Four times a day (QID) | ORAL | 0 refills | Status: DC | PRN
Start: 2023-08-06 — End: 2023-08-06

## 2023-08-06 MED ORDER — IOHEXOL 300 MG/ML  SOLN
100.0000 mL | Freq: Once | INTRAMUSCULAR | Status: AC | PRN
  Administered 2023-08-06: 100 mL via INTRAVENOUS

## 2023-08-06 MED ORDER — LIDOCAINE-EPINEPHRINE-TETRACAINE (LET) TOPICAL GEL
3.0000 mL | Freq: Once | TOPICAL | Status: AC
  Administered 2023-08-06: 3 mL via TOPICAL
  Filled 2023-08-06: qty 3

## 2023-08-06 MED ORDER — LIDOCAINE HCL (PF) 1 % IJ SOLN
10.0000 mL | Freq: Once | INTRAMUSCULAR | Status: AC
  Administered 2023-08-06: 10 mL
  Filled 2023-08-06: qty 10

## 2023-08-06 MED ORDER — TETANUS-DIPHTH-ACELL PERTUSSIS 5-2.5-18.5 LF-MCG/0.5 IM SUSY
0.5000 mL | PREFILLED_SYRINGE | Freq: Once | INTRAMUSCULAR | Status: AC
  Administered 2023-08-06: 0.5 mL via INTRAMUSCULAR
  Filled 2023-08-06: qty 0.5

## 2023-08-06 MED ORDER — OXYCODONE-ACETAMINOPHEN 5-325 MG PO TABS
1.0000 | ORAL_TABLET | Freq: Four times a day (QID) | ORAL | 0 refills | Status: DC | PRN
Start: 2023-08-06 — End: 2023-08-15

## 2023-08-06 NOTE — ED Triage Notes (Signed)
He reports having a perineal abscess x 2-3 days. Saw pcp yesterday, who prescribed Clindamycin. He tells me that he, among other meds, takes [place]. He is in no distress.

## 2023-08-06 NOTE — ED Provider Notes (Signed)
[place] EMERGENCY DEPARTMENT AT [facility] Provider Note   CSN: [id number] Arrival date & time: 08/06/23  1138    History  Chief Complaint  Patient presents with   Abscess    Perineal    Troy [name] is a 47 y.o. male hx of diabetes, pulmonary fibrosis, hepatitis C (treated) here for evaluation of swelling to perineal region.  Patient states this started 1 week ago.  He recently started [place].  He was seen by his PCP yesterday who was concerned for a perineal versus a scrotal abscess.  He was started on clindamycin.  He has done 4 doses.  He was told to come to the emergency department if his symptoms worsen.  Patient states he woke up this morning and he had increased pain, worse with walking subsequently leading him here to the emergency department.  No fever, nausea, vomiting, abdominal pain.  States pain is located at the base of his posterior scrotum.  No drainage.  No pain with bowel movements, dysuria, hematuria.  No history of similar.  HPI     Home Medications Prior to Admission medications   Medication Sig Start Date End Date Taking? Authorizing Provider  albuterol (PROVENTIL) (2.5 MG/3ML) 0.083% nebulizer solution Take 3 mLs (2.5 mg total) by nebulization every 4 (four) hours as needed for wheezing or shortness of breath. 05/13/23   Alfonse Spruce, MD  Albuterol-Budesonide 90-80 MCG/ACT AERO Inhale 2 Inhalations into the lungs every 4 (four) hours as needed. Maximum 12 inhalations per day. 03/29/23   Loyola Mast, MD  ammonium lactate (LAC-HYDRIN) 12 % lotion Apply 1 application topically as needed for dry skin.    [provider]  aspirin 325 MG tablet Take 1 tablet (325 mg total) by mouth daily. 07/18/15   Ghimire, Werner Lean, MD  Azelastine HCl 137 MCG/SPRAY SOLN USE 1 SPRAY(S) IN EACH NOSTRIL ONCE DAILY AS DIRECTED 06/09/23   Alfonse Spruce, MD  baclofen (LIORESAL) 10 MG tablet Take 1 tablet by mouth three times daily as needed  05/10/22   Loyola Mast, MD  bisoprolol (ZEBETA) 10 MG tablet Take 0.5 tablets (5 mg total) by mouth at bedtime. 07/22/23   Chandrasekhar, Mahesh A, MD  budesonide (PULMICORT) 0.5 MG/2ML nebulizer solution Mix with Xopenex nebulizer solution every 6 hours as needed. 04/08/23   Alfonse Spruce, MD  cimetidine (TAGAMET) 200 MG tablet Take 1 tablet daily Patient not taking: Reported on 05/31/2023 05/24/23   Terri Piedra, DO  clindamycin (CLEOCIN T) 1 % lotion Apply topically as needed (rash). 05/25/22   [provider]  clindamycin (CLEOCIN) 300 MG capsule Take 1 capsule (300 mg total) by mouth 4 (four) times daily for 7 days. 08/05/23 08/12/23  Garnette Gunner, MD  dapagliflozin propanediol (FARXIGA) 10 MG TABS tablet Take 1 tablet (10 mg total) by mouth daily before breakfast. 06/03/23   Riley Lam A, MD  doxycycline (VIBRAMYCIN) 100 MG capsule Take 1 with food once a day for 1 month 07/20/23   Terri Piedra, DO  EPINEPHrine 0.3 mg/0.3 mL IJ SOAJ injection Inject 0.3 mg into the muscle as needed for anaphylaxis. 04/07/23   Alfonse Spruce, MD  esomeprazole (NEXIUM) 40 MG capsule TAKE 1 CAPSULE BY MOUTH TWICE DAILY BEFORE A MEAL 01/17/23   Loyola Mast, MD  Evolocumab (REPATHA SURECLICK) 140 MG/ML SOAJ Inject 140 mg into the skin every 14 (fourteen) days. 05/20/23   Christell Constant, MD  fluticasone (FLONASE) 50 MCG/ACT  nasal spray Use 1 spray(s) in each nostril once daily Patient not taking: Reported on 05/31/2023 03/08/22   Loyola Mast, MD  Fluticasone-Umeclidin-[facility] ELLIPTA) 200-62.5-25 MCG/ACT AEPB INHALE 1 PUFF INTO LUNGS ONCE DAILY 06/07/23   Alfonse Spruce, MD  furosemide (LASIX) 20 MG tablet Take 1 tablet (20 mg total) by mouth 3 (three) times a week. 12/31/22   Christell Constant, MD  gabapentin (NEURONTIN) 300 MG capsule TAKE 2 Casp am, 2 caps noon, 4 caps bedtime 07/25/23   Loyola Mast, MD  hydrOXYzine (VISTARIL) 50 MG  capsule Take 1 capsule (50 mg total) by mouth at bedtime. 02/02/21   Loyola Mast, MD  levalbuterol [facility] HFA) 45 MCG/ACT inhaler Inhale 2 puffs into the lungs every 4 (four) hours as needed for wheezing. 05/31/23   Alfonse Spruce, MD  levalbuterol Pauline Aus) 1.25 MG/3ML nebulizer solution Take 1.25 mg by nebulization every 4 (four) hours as needed for wheezing. 04/08/23   Alfonse Spruce, MD  levothyroxine (SYNTHROID) 300 MCG tablet Take 300 mcg by mouth daily before breakfast.    [provider]  LUER LOCK SAFETY SYRINGES 22G X 1-1/2" 3 ML MISC USE FOR SELF ADMINISTER OF TESTOSTERONE 2CC ONCE EVERY 2 WEEKS 09/30/20   [provider]  Multiple Vitamin (MULTI-VITAMIN) tablet Take 1 tablet by mouth daily. 07/24/09   [provider]  predniSONE (DELTASONE) 10 MG tablet Take 3 tabs (30mg ) twice daily for 3 days, then 2 tabs (20mg ) twice daily for 3 days, then 1 tab (10mg ) twice daily for 3 days, then STOP. Patient not taking: Reported on 08/05/2023 06/28/23   Alfonse Spruce, MD  rosuvastatin (CRESTOR) 40 MG tablet Take 1 tablet by mouth once daily 03/20/23   Loyola Mast, MD  sacubitril-valsartan (ENTRESTO) 49-51 MG Take 1 tablet by mouth 2 (two) times daily. 05/20/23   Christell Constant, MD  Suvorexant (BELSOMRA) 10 MG TABS TAKE 1 TABLET BY MOUTH AT BEDTIME 05/22/23   Loyola Mast, MD  tamsulosin [facility]) 0.4 MG CAPS capsule Take 1 capsule by mouth once daily 09/18/22   Loyola Mast, MD  testosterone cypionate (DEPOTESTOSTERONE CYPIONATE) 200 MG/ML injection Inject 0.6 mLs (120 mg total) into the muscle once a week. 06/02/22   Loyola Mast, MD  traMADol (ULTRAM) 50 MG tablet Take 1 tablet (50 mg total) by mouth every 8 (eight) hours as needed for up to 5 days. 08/05/23 08/10/23  Garnette Gunner, MD  traZODone (DESYREL) 150 MG tablet TAKE 2 TABLETS BY MOUTH AT BEDTIME 03/21/23   Loyola Mast, MD  VENTOLIN HFA 108 [phone] Base) MCG/ACT inhaler  INHALE 2 PUFFS BY MOUTH EVERY 6 HOURS AS NEEDED FOR SHORTNESS OF BREATH 04/08/23   Alfonse Spruce, MD      Allergies    Ciprofloxacin, Earnestine Leys officinalis], Fluocinolone, and Prednisone    Review of Systems   Review of Systems  Constitutional: Negative.   HENT: Negative.    Respiratory: Negative.    Cardiovascular: Negative.   Gastrointestinal: Negative.   Genitourinary: Negative.        Perineal swelling  Musculoskeletal: Negative.   Skin: Negative.   Neurological: Negative.   All other systems reviewed and are negative.  Physical Exam Updated Vital Signs BP (!) 148/85   Pulse 82   Temp 97.9 F (36.6 C) (Oral)   Resp 16   Ht 6' (1.829 m)   Wt 95.3 kg   SpO2 97%   BMI  28.48 kg/m  Physical Exam Vitals and nursing note reviewed.  Constitutional:      General: He is not in acute distress.    Appearance: He is well-developed. He is not ill-appearing, toxic-appearing or diaphoretic.  HENT:     Head: Normocephalic and atraumatic.     Nose: Nose normal.     Mouth/Throat:     Mouth: Mucous membranes are moist.  Eyes:     Pupils: Pupils are equal, round, and reactive to light.  Cardiovascular:     Rate and Rhythm: Normal rate and regular rhythm.     Pulses: Normal pulses.     Heart sounds: Normal heart sounds.  Pulmonary:     Effort: Pulmonary effort is normal. No respiratory distress.  Abdominal:     General: Bowel sounds are normal. There is no distension.     Palpations: Abdomen is soft.     Tenderness: There is no abdominal tenderness.  Genitourinary:    Comments: GU exam with tech in room.  Approximately 2 cm area of induration to posterior scrotum/perineal region.  No overlying erythema or warmth.  Does not extend into rectum.  No scrotal erythema, warmth.  No crepitus. Musculoskeletal:        General: Normal range of motion.     Cervical back: Normal range of motion and neck supple.  Skin:    General: Skin is warm and dry.     Capillary Refill:  Capillary refill takes less than 2 seconds.  Neurological:     General: No focal deficit present.     Mental Status: He is alert and oriented to person, place, and time.     ED Results / Procedures / Treatments   Labs (all labs ordered are listed, but only abnormal results are displayed) Labs Reviewed  CBC WITH DIFFERENTIAL/PLATELET - Abnormal; Notable for the following components:      Result Value   WBC 13.8 (*)    RDW 23.5 (*)    Neutro Abs 10.0 (*)    Monocytes Absolute 1.7 (*)    Abs Immature Granulocytes 0.18 (*)    All other components within normal limits  BASIC METABOLIC PANEL - Abnormal; Notable for the following components:   Sodium 133 (*)    Glucose, Bld 100 (*)    BUN 22 (*)    All other components within normal limits    EKG None  Radiology CT PELVIS W CONTRAST  Result Date: 08/06/2023 CLINICAL DATA:  Scrotal mass or lump with perineal swelling. EXAM: CT PELVIS WITH CONTRAST TECHNIQUE: Multidetector CT imaging of the pelvis was performed using the standard protocol following the bolus administration of intravenous contrast. RADIATION DOSE REDUCTION: This exam was performed according to the departmental dose-optimization program which includes automated exposure control, adjustment of the mA and/or kV according to patient size and/or use of iterative reconstruction technique. CONTRAST:  OMNIPAQUE IOHEXOL 300 MG/ML  SOLN COMPARISON:  Scrotal ultrasound 08/06/2023. FINDINGS: Urinary Tract:  No abnormality visualized. Bowel: Unremarkable visualized pelvic bowel loops. Patient is status post appendectomy. Vascular/Lymphatic: No pathologically enlarged lymph nodes. No significant vascular abnormality seen. There is mild calcified atherosclerotic disease of the aorta. Reproductive: Prostate gland is within normal limits. The scrotum is incompletely imaged. There is no inguinal inflammatory stranding or fluid collection. No enlarged lymph nodes are seen. Other: There is  no ascites or free air. There is no focal abdominal wall hernia. The liver appears enlarged. Musculoskeletal: No suspicious bone lesions identified. IMPRESSION: 1. No acute process  in the pelvis. 2. The scrotum is incompletely imaged. 3. Hepatomegaly. Aortic Atherosclerosis (ICD10-I70.0). Electronically Signed   By: Darliss Cheney M.D.   On: 08/06/2023 17:56   US Scrotum  Result Date: 08/06/2023 CLINICAL DATA:  Mass versus abscess in the posterior scrotum. EXAM: ULTRASOUND OF SCROTUM TECHNIQUE: Complete ultrasound examination of the testicles, epididymis, and other scrotal structures was performed. COMPARISON:  CT abdomen and pelvis dated 03/25/2017. FINDINGS: Right testicle Measurements: 3.8 x 1.9 x 2.5 cm. No mass or microlithiasis visualized. Left testicle Measurements: 3.8 x 1.7 x 2.2 cm. No mass or microlithiasis visualized. Right epididymis:  Nonvisualized. Left epididymis:  Normal in size and appearance. Hydrocele: Small bilateral hydroceles. Varicocele:  Bilateral varicoceles are noted. Other: In the midline scrotum inferior to the testicles an irregular hypoechoic mass measures 4.1 x 2.2 x 3.6 cm. There is surrounding hyperemia and overlying skin thickening. A central portion of the mass is more hypoechoic, suggestive of an abscess. No definite soft tissue gas. IMPRESSION: 1. Irregular hypoechoic mass in the midline scrotum inferior to the testicles is suggestive of a scrotal wall abscess. 2. Bilateral varicoceles. 3. Small bilateral hydroceles. Electronically Signed   By: Romona Curls M.D.   On: 08/06/2023 15:36    Procedures .Marland KitchenIncision and Drainage  Date/Time: 08/06/2023 7:25 PM  Performed by: Ralph Leyden A, PA-C Authorized by: Linwood Dibbles, PA-C   Consent:    Consent obtained:  Verbal   Consent given by:  Patient   Risks, benefits, and alternatives were discussed: yes     Risks discussed:  Bleeding, incomplete drainage, pain, infection and damage to other organs    Alternatives discussed:  No treatment, alternative treatment, delayed treatment, observation and referral Universal protocol:    Procedure explained and questions answered to patient or proxy's satisfaction: yes     Relevant documents present and verified: yes     Test results available : yes     Imaging studies available: yes     Required blood products, implants, devices, and special equipment available: yes     Site/side marked: yes     Immediately prior to procedure, a time out was called: yes     Patient identity confirmed:  Verbally with patient Location:    Type:  Abscess   Size:  3cm   Location:  Anogenital   Anogenital location:  Scrotal wall Pre-procedure details:    Skin preparation:  Povidone-iodine Sedation:    Sedation type:  None Anesthesia:    Anesthesia method:  Topical application   Topical anesthetic:  LET Procedure type:    Complexity:  Complex Procedure details:    Ultrasound guidance: yes     Needle aspiration: no     Incision types:  Single straight   Incision depth:  Subcutaneous   Wound management:  Probed and deloculated   Drainage:  Purulent   Drainage amount:  Copious   Wound treatment:  Wound left open   Packing materials:  None Post-procedure details:    Procedure completion:  Tolerated well, no immediate complications     Medications Ordered in ED Medications  morphine (PF) 4 MG/ML injection 4 mg (4 mg Intravenous Given 08/06/23 1626)  iohexol (OMNIPAQUE) 300 MG/ML solution 100 mL (100 mLs Intravenous Contrast Given 08/06/23 1637)  Tdap (BOOSTRIX) injection 0.5 mL (0.5 mLs Intramuscular Given 08/06/23 1839)  lidocaine (PF) (XYLOCAINE) 1 % injection 10 mL (10 mLs Infiltration Given 08/06/23 1838)  lidocaine-EPINEPHrine-tetracaine (LET) topical gel (3 mLs Topical Given 08/06/23 1838)  ED Course/ Medical Decision Making/ A&P   47 year old here for evaluation of possible GU abscess.  Seen by PCP yesterday who placed referral to urology  and started on clindamycin.  Patient returns today due to worsening pain and swelling.  He is on [place].  He is afebrile, not swelling, non-ill-appearing.  On exam he does have tender mass to his perineal region at the base of his scrotum.  Does not extend into rectal area.  No pain with bowel movements, urinary symptoms.  Will plan on labs, imaging, reassess.  Labs and imaging personally viewed and interpreted:  CBC leukocytosis at 13.8 BMP sodium 133 US scrotum with possible abscess CT pelvis incomplete imaging  Patient reassessed. Requesting pain meds. Discussed labs, imaging. Pending CT imaging  Patient reassessed.  We discussed labs and imaging.  Will attempt drainage.  See procedure note. Tech in room as chaperone. Patient with copious purulent drainage expressed.  Drain not placed.  Will have warm compress, continue on antibiotics, keep appointment with urology.  At this time I have low suspicion for deep space infection including necrotizing fasciitis, Fournier's gangrene, myositis.  The patient has been appropriately medically screened and/or stabilized in the ED. I have low suspicion for any other emergent medical condition which would require further screening, evaluation or treatment in the ED or require inpatient management.  Patient is hemodynamically stable and in no acute distress.  Patient able to ambulate in department prior to ED.  Evaluation does not show acute pathology that would require ongoing or additional emergent interventions while in the emergency department or further inpatient treatment.  I have discussed the diagnosis with the patient and answered all questions.  Pain is been managed while in the emergency department and patient has no further complaints prior to discharge.  Patient is comfortable with plan discussed in room and is stable for discharge at this time.  I have discussed strict return precautions for returning to the emergency department.  Patient was  encouraged to follow-up with PCP/specialist refer to at discharge.                                 Medical Decision Making Amount and/or Complexity of Data Reviewed External Data Reviewed: labs, radiology and notes. Labs: ordered. Decision-making details documented in ED Course. Radiology: ordered and independent interpretation performed. Decision-making details documented in ED Course.  Risk OTC drugs. Prescription drug management. Decision regarding hospitalization. Diagnosis or treatment significantly limited by social determinants of health.          Final Clinical Impression(s) / ED Diagnoses Final diagnoses:  Scrotal abscess    Rx / DC Orders ED Discharge Orders     None         Graves Nipp A, PA-C 08/06/23 1928    Rozelle Logan, DO 08/07/23 [id number]

## 2023-08-06 NOTE — Assessment & Plan Note (Signed)
The patient presents with a painful scrotal lump that has progressively enlarged.  Concern for possible abscess.  Patient wishes to avoid going to the emergency department.  Differential Diagnosis: Abscess, however has been present over several months Epididymitis Scrotal mass/tumor Inguinal hernia  Plan: Initiate clindamycin 300 mg every 6 hours for 7 days due to the lack of response to doxycycline, covering for possible MRSA. Urgent referral to urology for further assessment. Educate the patient on signs of worsening infection and instruct them to go to the emergency department if symptoms worsen.

## 2023-08-06 NOTE — Discharge Instructions (Signed)
You had your abscess drained. Use warm compress to area 2-3 times daily. Continue taking the antibiotics as previously prescribed. Follow up with Urology. Return for new or worsening symptoms

## 2023-08-07 ENCOUNTER — Encounter: Payer: Self-pay | Admitting: Pharmacist

## 2023-08-08 ENCOUNTER — Telehealth: Payer: Self-pay | Admitting: Family Medicine

## 2023-08-08 ENCOUNTER — Ambulatory Visit: Payer: 59 | Admitting: Family Medicine

## 2023-08-08 DIAGNOSIS — L0291 Cutaneous abscess, unspecified: Secondary | ICD-10-CM | POA: Diagnosis not present

## 2023-08-08 NOTE — Telephone Encounter (Signed)
error 

## 2023-08-08 NOTE — Telephone Encounter (Signed)
NS pt got an appt to have an abcess drained. He will call back to reschedule letter printed.

## 2023-08-08 NOTE — Telephone Encounter (Signed)
Did not count as no show. Pt was able to be seen today by Urology to drain abscess.

## 2023-08-09 ENCOUNTER — Ambulatory Visit: Payer: 59 | Admitting: Urology

## 2023-08-10 DIAGNOSIS — L0291 Cutaneous abscess, unspecified: Secondary | ICD-10-CM | POA: Diagnosis not present

## 2023-08-15 ENCOUNTER — Emergency Department (HOSPITAL_COMMUNITY)
Admission: EM | Admit: 2023-08-15 | Discharge: 2023-08-15 | Disposition: A | Discharge status: Home / Self Care | Payer: 59 | Attending: Emergency Medicine | Admitting: Emergency Medicine

## 2023-08-15 ENCOUNTER — Encounter (HOSPITAL_COMMUNITY): Payer: Self-pay | Admitting: Emergency Medicine

## 2023-08-15 ENCOUNTER — Emergency Department (HOSPITAL_COMMUNITY): Payer: 59

## 2023-08-15 ENCOUNTER — Other Ambulatory Visit: Payer: Self-pay

## 2023-08-15 DIAGNOSIS — I10 Essential (primary) hypertension: Secondary | ICD-10-CM | POA: Diagnosis not present

## 2023-08-15 DIAGNOSIS — M25561 Pain in right knee: Secondary | ICD-10-CM | POA: Insufficient documentation

## 2023-08-15 DIAGNOSIS — J45909 Unspecified asthma, uncomplicated: Secondary | ICD-10-CM | POA: Diagnosis not present

## 2023-08-15 DIAGNOSIS — M79604 Pain in right leg: Secondary | ICD-10-CM | POA: Diagnosis not present

## 2023-08-15 DIAGNOSIS — Z7982 Long term (current) use of aspirin: Secondary | ICD-10-CM | POA: Insufficient documentation

## 2023-08-15 DIAGNOSIS — S838X1A Sprain of other specified parts of right knee, initial encounter: Secondary | ICD-10-CM | POA: Diagnosis not present

## 2023-08-15 DIAGNOSIS — M25562 Pain in left knee: Secondary | ICD-10-CM | POA: Insufficient documentation

## 2023-08-15 DIAGNOSIS — Z85850 Personal history of malignant neoplasm of thyroid: Secondary | ICD-10-CM | POA: Insufficient documentation

## 2023-08-15 DIAGNOSIS — M79605 Pain in left leg: Secondary | ICD-10-CM | POA: Diagnosis not present

## 2023-08-15 MED ORDER — OXYCODONE HCL 5 MG PO TABS: 5.0000 mg | ORAL_TABLET | Freq: Four times a day (QID) | ORAL | 0 refills | Status: DC | PRN

## 2023-08-15 MED ORDER — OXYCODONE-ACETAMINOPHEN 5-325 MG PO TABS
2.0000 | ORAL_TABLET | Freq: Once | ORAL | Status: AC
  Administered 2023-08-15: 2 via ORAL
  Filled 2023-08-15: qty 2

## 2023-08-15 MED ORDER — OXYCODONE HCL 5 MG PO TABS
5.0000 mg | ORAL_TABLET | Freq: Four times a day (QID) | ORAL | 0 refills | Status: DC | PRN
Start: 2023-08-15 — End: 2023-08-15

## 2023-08-15 NOTE — ED Triage Notes (Signed)
Per [facility] pt coming from gym- states he buckled while squatting. No fall. C/o bilateral knee pain- states right knee is worse. 18 G LAC. Given 200 mcg fentanyl en route.

## 2023-08-15 NOTE — Discharge Instructions (Addendum)
Thank you for allowing [place] to be a part of your care today.  You were evaluated in the ED for knee injury.  Your x-rays did not show dislocation or fracture.   You were given knee braces to wear to help with your injuries.  I recommend wearing these as much as possible, but you may remove them when you shower or bathe.  You were also provided with crutches to help with weight bearing.   Take 1000 mg of Tylenol every 6 hours to help with pain and swelling.  DO NOT exceed 4000 mg of Tylenol in a 24-hour period.  I have also sent in a prescription for oxycodone to take for severe or breakthrough pain.   Schedule an appointment with [facility] for follow-up.   Return to the ED if you develop sudden worsening of your symptoms or if you have new concerns.

## 2023-08-15 NOTE — ED Provider Notes (Signed)
[place] EMERGENCY DEPARTMENT AT [facility] Provider Note   CSN: [id number] Arrival date & time: 08/15/23  1534     History  Chief Complaint  Patient presents with   Knee Injury    Troy [name] is a 47 y.o. male with past medical history significant for bipolar 1 disorder, hypertension, pulmonary fibrosis, hyperlipidemia, asthma, CVA, thyroid cancer presents to the ED via EMS for bilateral knee pain.  Patient states he was squatting around 700 lbs when both of his knees "buckled" and gave out on him.  He states he felt a "pop" in both knees.  EMS gave patient 200 mcg of fentanyl en route.  He states he has arthritis in both knees, but has not had previous knee surgeries or serious injuries.        Home Medications Prior to Admission medications   Medication Sig Start Date End Date Taking? Authorizing Provider  oxyCODONE (ROXICODONE) 5 MG immediate release tablet Take 1-2 tablets (5-10 mg total) by mouth every 6 (six) hours as needed for severe pain (pain score 7-10). 08/15/23  Yes Thedford Bunton R, PA-C  albuterol (PROVENTIL) (2.5 MG/3ML) 0.083% nebulizer solution Take 3 mLs (2.5 mg total) by nebulization every 4 (four) hours as needed for wheezing or shortness of breath. 05/13/23   Alfonse Spruce, MD  Albuterol-Budesonide 90-80 MCG/ACT AERO Inhale 2 Inhalations into the lungs every 4 (four) hours as needed. Maximum 12 inhalations per day. 03/29/23   Loyola Mast, MD  ammonium lactate (LAC-HYDRIN) 12 % lotion Apply 1 application topically as needed for dry skin.    [provider]  aspirin 325 MG tablet Take 1 tablet (325 mg total) by mouth daily. 07/18/15   Ghimire, Werner Lean, MD  Azelastine HCl 137 MCG/SPRAY SOLN USE 1 SPRAY(S) IN EACH NOSTRIL ONCE DAILY AS DIRECTED 06/09/23   Alfonse Spruce, MD  baclofen (LIORESAL) 10 MG tablet Take 1 tablet by mouth three times daily as needed 05/10/22   Loyola Mast, MD  bisoprolol (ZEBETA) 10 MG tablet Take  0.5 tablets (5 mg total) by mouth at bedtime. 07/22/23   Chandrasekhar, Mahesh A, MD  budesonide (PULMICORT) 0.5 MG/2ML nebulizer solution Mix with Xopenex nebulizer solution every 6 hours as needed. 04/08/23   Alfonse Spruce, MD  cimetidine (TAGAMET) 200 MG tablet Take 1 tablet daily Patient not taking: Reported on 05/31/2023 05/24/23   Terri Piedra, DO  clindamycin (CLEOCIN T) 1 % lotion Apply topically as needed (rash). 05/25/22   [provider]  doxycycline (VIBRAMYCIN) 100 MG capsule Take 1 with food once a day for 1 month 07/20/23   Terri Piedra, DO  EPINEPHrine 0.3 mg/0.3 mL IJ SOAJ injection Inject 0.3 mg into the muscle as needed for anaphylaxis. 04/07/23   Alfonse Spruce, MD  esomeprazole (NEXIUM) 40 MG capsule TAKE 1 CAPSULE BY MOUTH TWICE DAILY BEFORE A MEAL 01/17/23   Loyola Mast, MD  Evolocumab (REPATHA SURECLICK) 140 MG/ML SOAJ Inject 140 mg into the skin every 14 (fourteen) days. 05/20/23   Christell Constant, MD  fluticasone (FLONASE) 50 MCG/ACT nasal spray Use 1 spray(s) in each nostril once daily Patient not taking: Reported on 05/31/2023 03/08/22   Loyola Mast, MD  Fluticasone-Umeclidin-[facility] ELLIPTA) 200-62.5-25 MCG/ACT AEPB INHALE 1 PUFF INTO LUNGS ONCE DAILY 06/07/23   Alfonse Spruce, MD  furosemide (LASIX) 20 MG tablet Take 1 tablet (20 mg total) by mouth 3 (three) times a week. 12/31/22  Chandrasekhar, Mahesh A, MD  gabapentin (NEURONTIN) 300 MG capsule TAKE 2 Casp am, 2 caps noon, 4 caps bedtime 07/25/23   Loyola Mast, MD  hydrOXYzine (VISTARIL) 50 MG capsule Take 1 capsule (50 mg total) by mouth at bedtime. 02/02/21   Loyola Mast, MD  levalbuterol [facility] HFA) 45 MCG/ACT inhaler Inhale 2 puffs into the lungs every 4 (four) hours as needed for wheezing. 05/31/23   Alfonse Spruce, MD  levalbuterol Pauline Aus) 1.25 MG/3ML nebulizer solution Take 1.25 mg by nebulization every 4 (four) hours as needed for wheezing.  04/08/23   Alfonse Spruce, MD  levothyroxine (SYNTHROID) 300 MCG tablet Take 300 mcg by mouth daily before breakfast.    [provider]  LUER LOCK SAFETY SYRINGES 22G X 1-1/2" 3 ML MISC USE FOR SELF ADMINISTER OF TESTOSTERONE 2CC ONCE EVERY 2 WEEKS 09/30/20   [provider]  Multiple Vitamin (MULTI-VITAMIN) tablet Take 1 tablet by mouth daily. 07/24/09   [provider]  predniSONE (DELTASONE) 10 MG tablet Take 3 tabs (30mg ) twice daily for 3 days, then 2 tabs (20mg ) twice daily for 3 days, then 1 tab (10mg ) twice daily for 3 days, then STOP. Patient not taking: Reported on 08/05/2023 06/28/23   Alfonse Spruce, MD  rosuvastatin (CRESTOR) 40 MG tablet Take 1 tablet by mouth once daily 03/20/23   Loyola Mast, MD  sacubitril-valsartan (ENTRESTO) 49-51 MG Take 1 tablet by mouth 2 (two) times daily. 05/20/23   Christell Constant, MD  Suvorexant (BELSOMRA) 10 MG TABS TAKE 1 TABLET BY MOUTH AT BEDTIME 05/22/23   Loyola Mast, MD  tamsulosin [facility]) 0.4 MG CAPS capsule Take 1 capsule by mouth once daily 09/18/22   Loyola Mast, MD  testosterone cypionate (DEPOTESTOSTERONE CYPIONATE) 200 MG/ML injection Inject 0.6 mLs (120 mg total) into the muscle once a week. 06/02/22   Loyola Mast, MD  traZODone (DESYREL) 150 MG tablet TAKE 2 TABLETS BY MOUTH AT BEDTIME 03/21/23   Loyola Mast, MD  VENTOLIN HFA 108 [phone] Base) MCG/ACT inhaler INHALE 2 PUFFS BY MOUTH EVERY 6 HOURS AS NEEDED FOR SHORTNESS OF BREATH 04/08/23   Alfonse Spruce, MD      Allergies    Ciprofloxacin, Earnestine Leys officinalis], Fluocinolone, Marcelline Deist [dapagliflozin], and Prednisone    Review of Systems   Review of Systems  Musculoskeletal:  Positive for arthralgias (bilateral knee pain, R worse than L). Negative for joint swelling.    Physical Exam Updated Vital Signs BP (!) 140/85 (BP Location: Right Arm)   Pulse 68   Temp 98.2 F (36.8 C) (Oral)   Resp 18   Ht 6' (1.829 m)    Wt 95.3 kg   SpO2 96%   BMI 28.48 kg/m  Physical Exam Vitals and nursing note reviewed.  Constitutional:      General: He is not in acute distress.    Appearance: Normal appearance. He is not ill-appearing or diaphoretic.  Cardiovascular:     Rate and Rhythm: Normal rate and regular rhythm.  Pulmonary:     Effort: Pulmonary effort is normal.  Musculoskeletal:     Right knee: No swelling, deformity, effusion, ecchymosis, bony tenderness or crepitus. Decreased range of motion. No tenderness. No LCL laxity, MCL laxity, ACL laxity or PCL laxity. Normal alignment and normal meniscus. Normal pulse.     Instability Tests: Anterior drawer test negative.     Left knee: No swelling, deformity, effusion, ecchymosis, bony tenderness or crepitus. Decreased  range of motion. No tenderness. No LCL laxity, MCL laxity, ACL laxity or PCL laxity.Normal alignment and normal meniscus. Normal pulse.     Instability Tests: Anterior drawer test negative.  Skin:    General: Skin is warm and dry.     Capillary Refill: Capillary refill takes less than 2 seconds.  Neurological:     Mental Status: He is alert. Mental status is at baseline.  Psychiatric:        Mood and Affect: Mood normal.        Behavior: Behavior normal.     ED Results / Procedures / Treatments   Labs (all labs ordered are listed, but only abnormal results are displayed) Labs Reviewed - No data to display  EKG None  Radiology No results found.  Procedures Procedures    Medications Ordered in ED Medications  oxyCODONE-acetaminophen (PERCOCET/ROXICET) 5-325 MG per tablet 2 tablet (2 tablets Oral Given 08/15/23 1701)    ED Course/ Medical Decision Making/ A&P                                 Medical Decision Making Amount and/or Complexity of Data Reviewed Radiology: ordered.   This patient presents to the ED with chief complaint(s) of bilateral knee pain. The complaint involves an extensive differential diagnosis and also  carries with it a high risk of complications and morbidity.    The differential diagnosis includes acute knee dislocation or fracture, ligamentous injury, tendon injury   Initial Assessment:   Exam significant for increased pain with flexion of both knees.  Extension normal bilaterally.  Decreased knee flexion, right worse than left due to pain.  No reproducible tenderness with palpation.  No obvious deformities, erythema, ecchymosis, or joint swelling.  No laxity appreciated during exam.  Treatment and Reassessment: Patient given 10 mg of oxycodone for pain with improvement in symptoms.    Independent interpretation of imaging: Bilateral knee x-rays were obtained and personally reviewed by me.  No obvious deformities, joint effusions, or fractures.  Patient states he believes he has a soft tissue injury and was informed by EMS that he would receive the appropriate imaging while in the ED.  Discussed with patient that more advanced imaging for soft tissue injuries, such as an MRI, is done on an outpatient basis during follow up.  Discussed with patient that his x-rays are reassuring for no bony injury or dislocation.  Advised patient that we can provide him with knee sleeves/braces and crutches to assist with weight bearing, as well as medication for severe pain control.  Discussed with patient he will need to schedule an appointment with orthopedics.  Upon record review, patient appears to be established with [facility].   Disposition:   Suspect patient has a soft tissue injury to both knees.  Advised patient to follow up outpatient with [facility], who he has seen in the past.  Patient provided with knee braces and crutches to help with weight bearing.  Advised patient to take Tylenol every 6 hours and only take oxycodone as needed for severe or breakthrough pain.   The patient has been appropriately medically screened and/or stabilized in the ED. I have low suspicion for any other emergent  medical condition which would require further screening, evaluation or treatment in the ED or require inpatient management. At time of discharge the patient is hemodynamically stable and in no acute distress. I have discussed work-up results and diagnosis with patient  and answered all questions. Patient is agreeable with discharge plan. We discussed strict return precautions for returning to the emergency department and they verbalized understanding.           Final Clinical Impression(s) / ED Diagnoses Final diagnoses:  Acute bilateral knee pain    Rx / DC Orders ED Discharge Orders          Ordered    oxyCODONE (ROXICODONE) 5 MG immediate release tablet  Every 6 hours PRN        08/15/23 1717              Lenard Simmer, PA-C 08/15/23 1728    Gloris Manchester, MD 08/17/23 [id number]

## 2023-08-15 NOTE — ED Notes (Signed)
Pt refused bilateral knee brace/ sleeve. PA aware.

## 2023-08-15 NOTE — Progress Notes (Signed)
Orthopedic Tech Progress Note Patient Details:  Troy [name] 28-Dec-1975 [id number]  Ortho Devices Type of Ortho Device: Knee Immobilizer, Ace wrap, Crutches Ortho Device/Splint Location: BLE Ortho Device/Splint Interventions: Ordered, Application, Adjustment   Post Interventions Patient Tolerated: Poor Instructions Provided: Care of device, Poper ambulation with device, Adjustment of device  [name] 08/15/2023, 6:04 PM

## 2023-08-16 ENCOUNTER — Other Ambulatory Visit (HOSPITAL_COMMUNITY): Payer: Self-pay | Admitting: Medical

## 2023-08-16 DIAGNOSIS — M25561 Pain in right knee: Secondary | ICD-10-CM

## 2023-08-16 DIAGNOSIS — M25562 Pain in left knee: Secondary | ICD-10-CM | POA: Diagnosis not present

## 2023-08-17 ENCOUNTER — Ambulatory Visit (HOSPITAL_COMMUNITY)
Admission: RE | Admit: 2023-08-17 | Discharge: 2023-08-17 | Disposition: A | Discharge status: Home / Self Care | Payer: 59 | Source: Ambulatory Visit | Attending: Medical | Admitting: Medical

## 2023-08-17 DIAGNOSIS — S83282A Other tear of lateral meniscus, current injury, left knee, initial encounter: Secondary | ICD-10-CM | POA: Diagnosis not present

## 2023-08-17 DIAGNOSIS — S8001XA Contusion of right knee, initial encounter: Secondary | ICD-10-CM | POA: Diagnosis not present

## 2023-08-17 DIAGNOSIS — M25562 Pain in left knee: Secondary | ICD-10-CM | POA: Diagnosis not present

## 2023-08-17 DIAGNOSIS — S8002XA Contusion of left knee, initial encounter: Secondary | ICD-10-CM | POA: Diagnosis not present

## 2023-08-17 DIAGNOSIS — S83511A Sprain of anterior cruciate ligament of right knee, initial encounter: Secondary | ICD-10-CM | POA: Diagnosis not present

## 2023-08-17 DIAGNOSIS — M25561 Pain in right knee: Secondary | ICD-10-CM

## 2023-08-17 DIAGNOSIS — S83512A Sprain of anterior cruciate ligament of left knee, initial encounter: Secondary | ICD-10-CM | POA: Diagnosis not present

## 2023-08-17 DIAGNOSIS — S76311A Strain of muscle, fascia and tendon of the posterior muscle group at thigh level, right thigh, initial encounter: Secondary | ICD-10-CM | POA: Diagnosis not present

## 2023-08-17 DIAGNOSIS — S83271A Complex tear of lateral meniscus, current injury, right knee, initial encounter: Secondary | ICD-10-CM | POA: Diagnosis not present

## 2023-08-17 DIAGNOSIS — S86912A Strain of unspecified muscle(s) and tendon(s) at lower leg level, left leg, initial encounter: Secondary | ICD-10-CM | POA: Diagnosis not present

## 2023-08-18 ENCOUNTER — Ambulatory Visit: Payer: 59 | Admitting: Internal Medicine

## 2023-08-18 ENCOUNTER — Encounter: Payer: Self-pay | Admitting: Internal Medicine

## 2023-08-18 VITALS — BP 106/66 | HR 91 | Temp 98.9°F | Ht 72.0 in | Wt 205.0 lb

## 2023-08-18 DIAGNOSIS — S83511A Sprain of anterior cruciate ligament of right knee, initial encounter: Secondary | ICD-10-CM

## 2023-08-18 DIAGNOSIS — S83512A Sprain of anterior cruciate ligament of left knee, initial encounter: Secondary | ICD-10-CM | POA: Diagnosis not present

## 2023-08-18 MED ORDER — OXYCODONE HCL 5 MG PO TABS: 5.0000 mg | ORAL_TABLET | Freq: Four times a day (QID) | ORAL | 0 refills | Status: AC | PRN

## 2023-08-18 NOTE — Progress Notes (Signed)
[facility] [address] [place] [address] Dept: [phone] Dept Fax: [phone]  Acute Care Office Visit  Subjective:   Troy [name] Dec 10, 1975 08/18/2023  Chief Complaint  Patient presents with   Pain Management    HPI: Discussed the use of AI scribe software for clinical note transcription with the patient, who gave verbal consent to proceed.  History of Present Illness   The patient, a Control and instrumentation engineer, presents with bilateral knee injuries sustained during a weightlifting session on Monday, 11/4 (see MRI report below). The patient was lifting a weight of 705 pounds when he lost balance and felt pops in both knees, leading to an uncontrollable fall. The patient immediately recognized the severity of the injuries and requested emergency medical assistance. Patient was taken to [facility] ER, then sought outpatient care at [facility] walk-in clinic. The patient has been experiencing significant pain since the incident and has been managing it with oxycodone that was prescribed from ER. The patient has been diligent in rationing the medication, taking two pills in the morning and one a few times during the rest of the day. The patient has been experiencing difficulty sleeping and getting out of bed due to the pain. The patient has a history of heart failure and liver issues, which limit the types of pain medication he can take. The patient is seeking the best possible surgical treatment to repair the injuries and return to competitive weightlifting. He has upcoming ortho appts on 11/8 at [facility] and 11/13 at [facility].     EXAM: MRI OF THE RIGHT KNEE WITHOUT CONTRAST   TECHNIQUE: Multiplanar, multisequence MR imaging of the knee was performed. No intravenous contrast was administered.   COMPARISON:  None Available.   FINDINGS: MENISCI   Medial: Intact.   Lateral: Complex tear of the posterior  horn of the lateral meniscus with a radial component.   LIGAMENTS   Cruciates: Complete ACL tear.  Intact PCL.   Collaterals: Medial collateral ligament is intact. Lateral collateral ligament complex is intact.   CARTILAGE   Patellofemoral: Mild cartilage fissuring of the medial patellar facet. Focal high-grade partial-thickness cartilage loss of the inferomedial trochlea with subchondral marrow edema.   Medial:  No chondral defect.   Lateral: Partial thickness cartilage loss of the posterior most aspect of the lateral tibial plateau with mild subchondral marrow edema. Mild partial-thickness cartilage loss of the weight-bearing lateral femoral condyle.   JOINT: Large joint effusion. Normal Hoffa's fat-pad. No plical thickening.   POPLITEAL FOSSA: Popliteus tendon is intact. Small Baker's cyst.   EXTENSOR MECHANISM: Intact quadriceps tendon. Intact patellar tendon. Intact lateral patellar retinaculum. Intact medial patellar retinaculum. Intact MPFL.   BONES: No aggressive osseous lesion. No fracture or dislocation. Mild osseous contusion of the posterolateral tibial plateau, anterolateral femoral condyle and posteromedial tibial plateau.   Other: No fluid collection or hematoma. Muscle strain and small partial-thickness tear of the distal semimembranosus muscle. Soft tissue edema circumferentially around the knee.   IMPRESSION: 1. Complete ACL tear. 2. Complex tear of the posterior horn of the lateral meniscus with a radial component. 3. Mild osseous contusion of the posterolateral tibial plateau, anterolateral femoral condyle and posteromedial tibial plateau. 4. Mild cartilage fissuring of the medial patellar facet. Focal high-grade partial-thickness cartilage loss of the inferomedial trochlea with subchondral marrow edema. 5. Partial thickness cartilage loss of the posterior most aspect of the lateral tibial plateau with mild subchondral marrow edema.  Mild partial-thickness cartilage loss  of the weight-bearing lateral femoral condyle. 6. Large joint effusion. 7. Muscle strain and small partial-thickness tear of the distal semimembranosus muscle.   EXAM: MRI OF THE LEFT KNEE WITHOUT CONTRAST   TECHNIQUE: Multiplanar, multisequence MR imaging of the knee was performed. No intravenous contrast was administered.   COMPARISON:  None Available.   FINDINGS: MENISCI   Medial: Intact.   Lateral: Radial tear of the posterior horn of the lateral meniscus and peripheral meniscal extrusion.   LIGAMENTS   Cruciates: Complete ACL tear.  Intact PCL.   Collaterals: Medial collateral ligament is intact. Lateral collateral ligament complex is intact.   CARTILAGE   Patellofemoral:  No chondral defect.   Medial:  No chondral defect.   Lateral: Mild partial-thickness cartilage loss of the weight-bearing lateral femoral condyle.   JOINT: Large joint effusion. Normal Hoffa's fat-pad. No plical thickening.   POPLITEAL FOSSA: Popliteus tendon is intact. No Baker's cyst.   EXTENSOR MECHANISM: Intact quadriceps tendon. Intact patellar tendon. Intact lateral patellar retinaculum. Intact medial patellar retinaculum. Intact MPFL.   BONES: No aggressive osseous lesion. No fracture or dislocation. Osseous contusion of the posterolateral tibial plateau, anterolateral femoral condyle and posteromedial tibial plateau.   Other: No fluid collection or hematoma. Mild muscle strain of the popliteus muscle and proximal soleus muscle. Mild soft tissue edema around the knee.   IMPRESSION: 1. Complete ACL tear. 2. Radial tear of the posterior horn of the lateral meniscus and peripheral meniscal extrusion. 3. Osseous contusion of the posterolateral tibial plateau, anterolateral femoral condyle and posteromedial tibial plateau. 4. Mild muscle strain of the popliteus muscle and proximal soleus muscle. 5. Large joint effusion.    PDMP  reviewed, last fill of oxycodone was 08/15/23 for 20 tablets (2 day supply based on prescription instructions).   The following portions of the patient's history were reviewed and updated as appropriate: past medical history, past surgical history, family history, social history, allergies, medications, and problem list.   Patient Active Problem List   Diagnosis Date Noted   Scrotal abscess 08/06/2023   Polycythemia, secondary 04/22/2023   Moderate persistent asthma with exacerbation 03/29/2023   SVT (supraventricular tachycardia) (HCC) 12/30/2022   Aortic atherosclerosis (HCC) 12/30/2022   HFrEF (heart failure with reduced ejection fraction) (HCC) 12/30/2022   Blood pressure alteration 12/30/2022   Erythrocytosis 11/13/2022   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   Lower urinary tract symptoms (LUTS) 06/02/2022   Rotator cuff tear, right 03/03/2022   History of squamous cell carcinoma in situ (SCCIS) of skin 02/22/2022   Osteoarthritis of acromioclavicular joint 10/15/2021   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   History of splenectomy 02/02/2021   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   Erectile dysfunction 02/02/2021   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   Pulmonary fibrosis (HCC) 02/02/2021   Peripheral neuropathy 02/02/2021   History of hepatitis C 02/02/2021   Back pain 02/02/2021   Hyperlipidemia 02/02/2021   Moderate persistent asthma, uncomplicated 08/13/2020   History of Hodgkin's lymphoma 08/13/2020   Chronic rhinitis 08/13/2020   Not currently working due to disabled status 08/13/2020   Pain in joint of left knee 07/25/2020   History of thyroid cancer 06/09/2020   Arthralgia of left elbow 05/01/2020   Autonomic dysfunction 04/23/2020   Iron deficiency anemia due to chronic blood loss 01/18/2020   Microalbuminuria 01/18/2020   Chronic insomnia 01/17/2020   Large liver 01/17/2020   Prediabetes 01/11/2019   Daytime somnolence  07/22/2017   History of basal cell carcinoma (BCC)  11/30/2016   GERD (gastroesophageal reflux disease) 08/25/2016   Essential hypertension 08/25/2016   Substance induced mood disorder (HCC)    History of stroke    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   Chronic fatigue    Drug abuse (HCC)    Family history of breast cancer 05/19/2021   Family history of colon cancer in mother    GERD (gastroesophageal reflux disease)    +Barretts   Hematochezia    hemorrhoids   Hepatic steatosis    +hepatomegaly   History of CVA (cerebrovascular accident)    radiation-induced carotid damage/changes   History of hepatitis C    +treated   History of Hodgkin's lymphoma    age 50->mantle RT + splenectomy   History of splenectomy    Hyperlipidemia    Hypertension    Insomnia    Male hypogonadism    Moderate persistent asthma    Obesity, Class I, BMI 30-34.9    Papillary thyroid carcinoma (HCC) 2010   total thyroidectomy + neck dissection   Postsurgical hypothyroidism    Prediabetes    Pulmonary fibrosis (HCC)    ?rad induced?   Past Surgical History:  Procedure Laterality Date   COLONOSCOPY  07/04/2019   done for hematochezia and FH col ca-mother: Mod int hem w/inflamm.  No adenomatous polyps.   INCISIONAL HERNIA REPAIR  06/2019   RUQ (prev lap append site)   LAPAROSCOPIC APPENDECTOMY N/A 03/25/2017   Procedure: APPENDECTOMY LAPAROSCOPIC; EXTENSIVE LYSIS OF ADHESIONS FROM PRIOR RADIATION AND SURGERY;  Surgeon: Luretha Murphy, MD;  Location: [facility] ORS;  Service: General;  Laterality: N/A;   PARATHYROIDECTOMY     SPLENECTOMY     TEE WITHOUT CARDIOVERSION N/A 07/18/2015   Procedure: TRANSESOPHAGEAL ECHOCARDIOGRAM (TEE);  Surgeon: [name], MD;  Location: [facility] ENDOSCOPY;  Service: Cardiovascular;  Laterality: N/A;   THYROIDECTOMY     Family History  Problem Relation Age of Onset   Breast  cancer Mother        dx early 39s   Colon cancer Mother        dx early 30s   Other Mother        NTHL1 homozygous   Parkinsonism Father    Multiple myeloma Father        dx 51s   Breast cancer Maternal Aunt    Stroke Maternal Grandmother    Leukemia Other        dx <20; two paternal first cousins once removed    Current Outpatient Medications:    albuterol (PROVENTIL) (2.5 MG/3ML) 0.083% nebulizer solution, Take 3 mLs (2.5 mg total) by nebulization every 4 (four) hours as needed for wheezing or shortness of breath., Disp: 75 mL, Rfl: 1   Albuterol-Budesonide 90-80 MCG/ACT AERO, Inhale 2 Inhalations into the lungs every 4 (four) hours as needed. Maximum 12 inhalations per day., Disp: 10.7 g, Rfl: 2   ammonium lactate (LAC-HYDRIN) 12 % lotion, Apply 1 application topically as needed for dry skin., Disp: , Rfl:    aspirin 325 MG tablet, Take 1 tablet (325 mg total) by mouth daily., Disp: 30 tablet, Rfl: 0   Azelastine HCl 137 MCG/SPRAY SOLN, USE 1 SPRAY(S) IN EACH NOSTRIL ONCE DAILY AS DIRECTED, Disp: 30 mL, Rfl: 5   baclofen (LIORESAL) 10 MG tablet, Take 1 tablet by mouth three times daily as needed, Disp: 30 tablet, Rfl:  5   bisoprolol (ZEBETA) 10 MG tablet, Take 0.5 tablets (5 mg total) by mouth at bedtime. (Patient taking differently: Take 5 mg by mouth at bedtime. 5 mg), Disp: , Rfl:    budesonide (PULMICORT) 0.5 MG/2ML nebulizer solution, Mix with Xopenex nebulizer solution every 6 hours as needed., Disp: 120 mL, Rfl: 2   cimetidine (TAGAMET) 200 MG tablet, Take 1 tablet daily, Disp: 30 tablet, Rfl: 2   clindamycin (CLEOCIN T) 1 % lotion, Apply topically as needed (rash)., Disp: , Rfl:    EPINEPHrine 0.3 mg/0.3 mL IJ SOAJ injection, Inject 0.3 mg into the muscle as needed for anaphylaxis., Disp: 2 each, Rfl: 2   esomeprazole (NEXIUM) 40 MG capsule, TAKE 1 CAPSULE BY MOUTH TWICE DAILY BEFORE A MEAL, Disp: 180 capsule, Rfl: 3   Evolocumab (REPATHA SURECLICK) 140 MG/ML SOAJ, Inject 140  mg into the skin every 14 (fourteen) days., Disp: 6 mL, Rfl: 3   fluticasone (FLONASE) 50 MCG/ACT nasal spray, Use 1 spray(s) in each nostril once daily, Disp: 16 g, Rfl: 11   Fluticasone-Umeclidin-Vilant (TRELEGY ELLIPTA) 200-62.5-25 MCG/ACT AEPB, INHALE 1 PUFF INTO LUNGS ONCE DAILY, Disp: 60 each, Rfl: 5   furosemide (LASIX) 20 MG tablet, Take 1 tablet (20 mg total) by mouth 3 (three) times a week., Disp: 36 tablet, Rfl: 3   gabapentin (NEURONTIN) 300 MG capsule, TAKE 2 Casp am, 2 caps noon, 4 caps bedtime, Disp: 240 capsule, Rfl: 3   hydrOXYzine (VISTARIL) 50 MG capsule, Take 1 capsule (50 mg total) by mouth at bedtime., Disp: 90 capsule, Rfl: 3   levalbuterol (XOPENEX HFA) 45 MCG/ACT inhaler, Inhale 2 puffs into the lungs every 4 (four) hours as needed for wheezing., Disp: 2 each, Rfl: 4   levalbuterol (XOPENEX) 1.25 MG/3ML nebulizer solution, Take 1.25 mg by nebulization every 4 (four) hours as needed for wheezing., Disp: 120 mL, Rfl: 1   levothyroxine (SYNTHROID) 300 MCG tablet, Take 300 mcg by mouth daily before breakfast., Disp: , Rfl:    LUER LOCK SAFETY SYRINGES 22G X 1-1/2" 3 ML MISC, USE FOR SELF ADMINISTER OF TESTOSTERONE 2CC ONCE EVERY 2 WEEKS, Disp: , Rfl:    Multiple Vitamin (MULTI-VITAMIN) tablet, Take 1 tablet by mouth daily., Disp: , Rfl:    predniSONE (DELTASONE) 10 MG tablet, Take 3 tabs (30mg ) twice daily for 3 days, then 2 tabs (20mg ) twice daily for 3 days, then 1 tab (10mg ) twice daily for 3 days, then STOP., Disp: 36 tablet, Rfl: 0   rosuvastatin (CRESTOR) 40 MG tablet, Take 1 tablet by mouth once daily, Disp: 90 tablet, Rfl: 3   sacubitril-valsartan (ENTRESTO) 49-51 MG, Take 1 tablet by mouth 2 (two) times daily., Disp: 60 tablet, Rfl: 3   Suvorexant (BELSOMRA) 10 MG TABS, TAKE 1 TABLET BY MOUTH AT BEDTIME, Disp: 30 tablet, Rfl: 2   tamsulosin (FLOMAX) 0.4 MG CAPS capsule, Take 1 capsule by mouth once daily, Disp: 30 capsule, Rfl: 11   testosterone cypionate  (DEPOTESTOSTERONE CYPIONATE) 200 MG/ML injection, Inject 0.6 mLs (120 mg total) into the muscle once a week., Disp: 10 mL, Rfl: 0   traZODone (DESYREL) 150 MG tablet, TAKE 2 TABLETS BY MOUTH AT BEDTIME, Disp: 180 tablet, Rfl: 3   VENTOLIN HFA 108 (90 Base) MCG/ACT inhaler, INHALE 2 PUFFS BY MOUTH EVERY 6 HOURS AS NEEDED FOR SHORTNESS OF BREATH, Disp: 18 g, Rfl: 1   doxycycline (VIBRAMYCIN) 100 MG capsule, Take 1 with food once a day for 1 month (Patient not taking: Reported on  08/18/2023), Disp: 30 capsule, Rfl: 1   oxyCODONE (ROXICODONE) 5 MG immediate release tablet, Take 1 tablet (5 mg total) by mouth every 6 (six) hours as needed for up to 5 days for severe pain (pain score 7-10)., Disp: 20 tablet, Rfl: 0 Allergies  Allergen Reactions   Ciprofloxacin Other (See Comments)    Spontaneous rupture of tendon   Earnestine Leys Officinalis] Anaphylaxis and Hives   Fluocinolone Other (See Comments)    "spontaneous rupture of tendons" per pt dr.   Marcelline Deist [Dapagliflozin]     Scrotal abscess    Prednisone Rash     ROS: A complete ROS was performed with pertinent positives/negatives noted in the HPI. The remainder of the ROS are negative.    Objective:   Today's Vitals   08/18/23 1429  BP: 106/66  Pulse: 91  Temp: 98.9 F (37.2 C)  TempSrc: Temporal  SpO2: 96%  Weight: 205 lb (93 kg)  Height: 6' (1.829 m)    GENERAL: Well-appearing, in NAD. Well nourished.  SKIN: Pink, warm and dry. No rash. RESPIRATORY: Chest wall symmetrical. Respirations even and non-labored.  CARDIAC:  Peripheral pulses 2+ bilaterally.  MSK: Muscle tone and strength appropriate for age. Unsteady ambulation secondary to knee injuries. Using crutches for assistance EXTREMITIES: Without edema.  PSYCH/MENTAL STATUS: Alert, oriented x 3. Cooperative, appropriate mood and affect.    No results found for any visits on 08/18/23.    Assessment & Plan:  Assessment and Plan    Bilateral Complete ACL tears - Prescribe  Oxycodone for acute pain control - Patient has appointments with two different orthopedic surgeons for second opinions. - Advise patient to follow up with orthopedic surgeon for further pain management.     Meds ordered this encounter  Medications   oxyCODONE (ROXICODONE) 5 MG immediate release tablet    Sig: Take 1 tablet (5 mg total) by mouth every 6 (six) hours as needed for up to 5 days for severe pain (pain score 7-10).    Dispense:  20 tablet    Refill:  0    Order Specific Question:   Supervising Provider    Answer:   Garnette Gunner [[id number]]   No orders of the defined types were placed in this encounter.  Lab Orders  No laboratory test(s) ordered today   No images are attached to the encounter or orders placed in the encounter.  Return if symptoms worsen or fail to improve.   Salvatore Decent, FNP

## 2023-08-19 DIAGNOSIS — S83512A Sprain of anterior cruciate ligament of left knee, initial encounter: Secondary | ICD-10-CM | POA: Diagnosis not present

## 2023-08-19 DIAGNOSIS — S83511A Sprain of anterior cruciate ligament of right knee, initial encounter: Secondary | ICD-10-CM | POA: Diagnosis not present

## 2023-08-19 DIAGNOSIS — S83281A Other tear of lateral meniscus, current injury, right knee, initial encounter: Secondary | ICD-10-CM | POA: Diagnosis not present

## 2023-08-19 DIAGNOSIS — S83282A Other tear of lateral meniscus, current injury, left knee, initial encounter: Secondary | ICD-10-CM | POA: Diagnosis not present

## 2023-08-21 ENCOUNTER — Other Ambulatory Visit: Payer: Self-pay | Admitting: Family Medicine

## 2023-08-21 DIAGNOSIS — F5104 Psychophysiologic insomnia: Secondary | ICD-10-CM

## 2023-08-22 ENCOUNTER — Telehealth (HOSPITAL_COMMUNITY): Payer: Self-pay | Admitting: Emergency Medicine

## 2023-08-22 NOTE — Telephone Encounter (Signed)
Patient to call from the pharmacy.  Patient was seen here about a week ago and had 2 different prescription sent electronically.  It looks according to the PDMP that he had 1 filled, has been seen by his surgeon and just had another narcotic prescription filled.  He then tried to fill the other prescription that was originally sent today.  After discussion with the pharmacist I do not think this is appropriate.

## 2023-08-23 ENCOUNTER — Telehealth: Payer: Self-pay

## 2023-08-23 ENCOUNTER — Telehealth: Payer: Self-pay | Admitting: Neurology

## 2023-08-23 ENCOUNTER — Institutional Professional Consult (permissible substitution): Payer: 59 | Admitting: Neurology

## 2023-08-23 DIAGNOSIS — M25562 Pain in left knee: Secondary | ICD-10-CM | POA: Diagnosis not present

## 2023-08-23 DIAGNOSIS — M23611 Other spontaneous disruption of anterior cruciate ligament of right knee: Secondary | ICD-10-CM | POA: Diagnosis not present

## 2023-08-23 DIAGNOSIS — S83512S Sprain of anterior cruciate ligament of left knee, sequela: Secondary | ICD-10-CM | POA: Diagnosis not present

## 2023-08-23 MED ORDER — ENTRESTO 97-103 MG PO TABS: 1.0000 | ORAL_TABLET | Freq: Two times a day (BID) | ORAL | 11 refills | Status: DC

## 2023-08-23 NOTE — Telephone Encounter (Signed)
LVM and sent mychart msg informing pt of need to reschedule 08/23/23 appt - MD out

## 2023-08-23 NOTE — Addendum Note (Signed)
Addended by: Malena Peer D on: 08/23/2023 10:00 AM   Modules accepted: Orders

## 2023-08-23 NOTE — Telephone Encounter (Signed)
Patient was called today at 6:48 to be advised that his appointment with Dr Frances Furbish at 11:15 today needed to be cancelled and rescheduled due to the provider being sick. A voicemail was left for the patient. Patient called in today at 8:45 to reschedule this appointment. I apologized for the inconvenience and offered the soonest available date of 12/3 with Dr Frances Furbish. Dr Dohmeier did not have any sooner appointments. The patient got irate that he had to wait "months" for this appointment (patient was scheduled 10/22 so waited 3 weeks for appointment) and now he was back to the beginning waiting months again. He expressed frustration with the healthcare system and stated that [facility] is no longer patient oriented. I apologized again and advised that I could place him on a cancellation list but that unfortunately, our provider getting sick was out of our control and that December 3 is the first available I had with either of our sleep specialists. He again ranted about the state of the healthcare system and our offices subpar treatment of patients, rescheduling them with little to no notice. I asked him if we would like to speak with management regarding the subpar service he received today and he said he did not want to waste his time.  I was able to get him rescheduled for an appointment on 12/4 and added to the wait list, while I was trying to advise him that he was placed on the wait list as high priority, he hung up the phone on me.  Just an [facility], if you see anything for this week or next with one of the sleep providers, can you call this patient to move him up.

## 2023-08-24 ENCOUNTER — Ambulatory Visit (INDEPENDENT_AMBULATORY_CARE_PROVIDER_SITE_OTHER): Payer: 59 | Admitting: Neurology

## 2023-08-24 ENCOUNTER — Telehealth: Payer: Self-pay | Admitting: *Deleted

## 2023-08-24 ENCOUNTER — Encounter: Payer: Self-pay | Admitting: Neurology

## 2023-08-24 ENCOUNTER — Telehealth: Payer: Self-pay

## 2023-08-24 VITALS — BP 113/67 | HR 93 | Ht 72.0 in | Wt 215.0 lb

## 2023-08-24 DIAGNOSIS — E663 Overweight: Secondary | ICD-10-CM

## 2023-08-24 DIAGNOSIS — Z9189 Other specified personal risk factors, not elsewhere classified: Secondary | ICD-10-CM

## 2023-08-24 DIAGNOSIS — R0681 Apnea, not elsewhere classified: Secondary | ICD-10-CM

## 2023-08-24 DIAGNOSIS — R0683 Snoring: Secondary | ICD-10-CM | POA: Diagnosis not present

## 2023-08-24 DIAGNOSIS — G4700 Insomnia, unspecified: Secondary | ICD-10-CM

## 2023-08-24 DIAGNOSIS — Z820 Family history of epilepsy and other diseases of the nervous system: Secondary | ICD-10-CM

## 2023-08-24 NOTE — Telephone Encounter (Signed)
   Pre-operative Risk Assessment    Patient Name: Troy [name]  DOB: 02-Jan-1976 MRN: [id number]  Last office visit : 06/03/2023 Upcoming appointment :  N/A     Request for Surgical Clearance    Procedure:   Left knee scope ACL reconstruction.possible  Date of Surgery:  Clearance TBD                                 Surgeon:  Dr. Duwayne Heck Surgeon's Group or Practice Name:  [facility] Phone number:  [phone] Fax number:  [phone]   Type of Clearance Requested:   - Medical  - Pharmacy:  Hold Aspirin 325mg   : not indicated   Type of Anesthesia:  Not Indicated   Additional requests/questions:    Elyse Jarvis   08/24/2023, 1:56 PM

## 2023-08-24 NOTE — Patient Instructions (Signed)

## 2023-08-24 NOTE — Telephone Encounter (Signed)
   Name: Troy [name]  DOB: [birth year]  MRN: [id number]  Primary Cardiologist: Christell Constant, MD   Preoperative team, please contact this patient and set up a phone call appointment for further preoperative risk assessment. Please obtain consent and complete medication review. Thank you for your help.  Seen last by Wynell Balloon, PA, 05/10/2023  I confirm that guidance regarding antiplatelet and oral anticoagulation therapy has been completed and, if necessary, noted below  Per office protocol, if patient is without any new symptoms or concerns at the time of their virtual visit, he/she may hold ASA for 7 days prior to procedure. Please resume ASA as soon as possible postprocedure, at the discretion of the surgeon.    I also confirmed the patient resides in the state of [place]. As per [facility] Medical Board telemedicine laws, the patient must reside in the state in which the provider is licensed.   Joni Reining, NP 08/24/2023, 2:56 PM [facility]

## 2023-08-24 NOTE — Telephone Encounter (Signed)
I s/w the pt and he is agreeable to tele pre op appt. Pt tells me that he is deciding between 2 surgeon's office either [facility] or [facility]. The request we have right now is for [facility]. I advised the pt that we at least schedule tele at this point since he tells me one of the surgeon's office can do his surgery 09/15/23. I stated my 1st tele appt is not until 09/13/23. Pt then tells me well he has to go now. I informed the pt that if he goes with [facility] doctor then we need request from [facility]. Pt said ok.   Med rec and consent are done.      Patient Consent for Virtual Visit        Troy [name] has provided verbal consent on 08/24/2023 for a virtual visit (video or telephone).   CONSENT FOR VIRTUAL VISIT FOR:  [name]  By participating in this virtual visit I agree to the following:  I hereby voluntarily request, consent and authorize [facility] and its employed or contracted physicians, physician assistants, nurse practitioners or other licensed health care professionals (the Practitioner), to provide me with telemedicine health care services (the "Services") as deemed necessary by the treating Practitioner. I acknowledge and consent to receive the Services by the Practitioner via telemedicine. I understand that the telemedicine visit will involve communicating with the Practitioner through live audiovisual communication technology and the disclosure of certain medical information by electronic transmission. I acknowledge that I have been given the opportunity to request an in-person assessment or other available alternative prior to the telemedicine visit and am voluntarily participating in the telemedicine visit.  I understand that I have the right to withhold or withdraw my consent to the use of telemedicine in the course of my care at any time, without affecting my right to future care or treatment, and that the Practitioner or I may terminate  the telemedicine visit at any time. I understand that I have the right to inspect all information obtained and/or recorded in the course of the telemedicine visit and may receive copies of available information for a reasonable fee.  I understand that some of the potential risks of receiving the Services via telemedicine include:  Delay or interruption in medical evaluation due to technological equipment failure or disruption; Information transmitted may not be sufficient (e.g. poor resolution of images) to allow for appropriate medical decision making by the Practitioner; and/or  In rare instances, security protocols could fail, causing a breach of personal health information.  Furthermore, I acknowledge that it is my responsibility to provide information about my medical history, conditions and care that is complete and accurate to the best of my ability. I acknowledge that Practitioner's advice, recommendations, and/or decision may be based on factors not within their control, such as incomplete or inaccurate data provided by me or distortions of diagnostic images or specimens that may result from electronic transmissions. I understand that the practice of medicine is not an exact science and that Practitioner makes no warranties or guarantees regarding treatment outcomes. I acknowledge that a copy of this consent can be made available to me via my patient portal [facility] MyChart), or I can request a printed copy by calling the office of [facility].    I understand that my insurance will be billed for this visit.   I have read or had this consent read to me. I understand the contents of this  consent, which adequately explains the benefits and risks of the Services being provided via telemedicine.  I have been provided ample opportunity to ask questions regarding this consent and the Services and have had my questions answered to my satisfaction. I give my informed consent for the  services to be provided through the use of telemedicine in my medical care

## 2023-08-24 NOTE — Telephone Encounter (Signed)
I s/w the pt and he is agreeable to tele pre op appt. Pt tells me that he is deciding between 2 surgeon's office either [facility] or [facility]. The request we have right now is for [facility]. I advised the pt that we at least schedule tele at this point since he tells me one of the surgeon's office can do his surgery 09/15/23. I stated my 1st tele appt is not until 09/13/23. Pt then tells me well he has to go now. I informed the pt that if he goes with [facility] doctor then we need request from [facility]. Pt said ok.   Med rec and consent are done.

## 2023-08-24 NOTE — Progress Notes (Signed)
Subjective:    Patient ID: Troy [name] is a 47 y.o. male.  HPI    Huston Foley, MD, PhD [facility] [address] [address] [place], [place] [address]  Dear Dr. Veto Kemps,  I saw your patient, Troy [name], upon your kind request in my sleep clinic today for initial consultation of his sleep disorder, in particular, concern for underlying obstructive sleep apnea.  The patient is unaccompanied today.  As you know, Troy [name] is a 47 year old male with an underlying complex medical history of asthma, allergies, aortic atherosclerosis, hypertension, skin cancer, thyroid cancer with status post total thyroidectomy and neck dissection, hypothyroidism, hyperlipidemia, Hodgkin's lymphoma with s/p XRT at age [age] yo, hepatitis C, history of CVA, mood disorder and overweight state, who reports snoring and witnessed apneas, per girlfriend.  His Epworth sleepiness score is 0 out of 24, fatigue severity score is 39 out of 63.Marland Kitchen He saw Dr. Stanton Kidney neurology last year for transient neurological symptoms.  He takes several sedating medications, reports chronic difficulty initiating and maintaining sleep.  He takes gabapentin 1800 mg at night and 600 mg in the morning.  He takes trazodone 300 mg at bedtime, doxylamine at night, melatonin at night, Benadryl about 50 to 75 mg each night.  He drinks caffeine in the form of coffee, 50 to 100 mg daily, preworkout also includes caffeine so altogether up to 300 mg of caffeine per day he estimates.  He lives alone, he has no children, no pets in the house.  He drinks alcohol rarely.  He smokes marijuana daily.  He has a remote history of drug use including heroin, in remission for about 10 years.  He is on disability.  He reports having had 2 strokes in 2016.  He recently injured both knees, he has seen orthopedics.  He may need surgery on both knees.  Both parents have sleep apnea and uses PAP machines.  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   Chronic fatigue    Drug abuse (HCC)    Family history of breast cancer 05/19/2021   Family history of colon cancer in mother    GERD (gastroesophageal reflux disease)    +Barretts   Hematochezia    hemorrhoids   Hepatic steatosis    +hepatomegaly   History of CVA (cerebrovascular accident)    radiation-induced carotid damage/changes   History of hepatitis C    +treated   History of Hodgkin's lymphoma    age 67->mantle RT + splenectomy   History of splenectomy    Hyperlipidemia    Hypertension    Insomnia    Male hypogonadism    Moderate persistent asthma    Obesity, Class I, BMI 30-34.9    Papillary thyroid carcinoma (HCC) 2010   total thyroidectomy + neck dissection   Postsurgical hypothyroidism    Prediabetes    Pulmonary fibrosis (HCC)    ?rad induced?    His Past Surgical History Is Significant For: Past Surgical History:  Procedure Laterality Date   COLONOSCOPY  07/04/2019   done for hematochezia and FH col ca-mother: Mod int hem w/inflamm.  No adenomatous polyps.   INCISIONAL HERNIA REPAIR  06/2019   RUQ (prev lap append site)   LAPAROSCOPIC APPENDECTOMY N/A 03/25/2017   Procedure: APPENDECTOMY LAPAROSCOPIC; EXTENSIVE LYSIS OF ADHESIONS FROM PRIOR  RADIATION AND SURGERY;  Surgeon: Luretha Murphy, MD;  Location: [facility];  Service: General;  Laterality: N/A;   PARATHYROIDECTOMY     SPLENECTOMY     TEE WITHOUT CARDIOVERSION N/A 07/18/2015   Procedure: TRANSESOPHAGEAL ECHOCARDIOGRAM (TEE);  Surgeon: [name], MD;  Location[facility] ENDOSCOPY;  Service: Cardiovascular;  Laterality: N/A;   THYROIDECTOMY      His Family History Is Significant For: Family History  Problem Relation Age of Onset   Breast cancer Mother        dx early 71s   Colon cancer Mother        dx early 26s   Other Mother        NTHL1  homozygous   Sleep apnea Mother    Parkinsonism Father    Multiple myeloma Father        dx 58s   Sleep apnea Father    Breast cancer Maternal Aunt    Stroke Maternal Grandmother    Leukemia Other        dx <20; two paternal first cousins once removed    His Social History Is Significant For: Social History   Socioeconomic History   Marital status: Single    Spouse name: Not on file   Number of children: 0   Years of education: Not on file   Highest education level: Not on file  Occupational History   Not on file  Tobacco Use   Smoking status: Former    Current packs/day: 0.00    Types: Cigarettes    Quit date: 2013    Years since quitting: 11.8   Smokeless tobacco: Never  Vaping Use   Vaping status: Never Used  Substance and Sexual Activity   Alcohol use: Not Currently    Comment: occasionally   Drug use: Yes    Types: Marijuana   Sexual activity: Yes  Other Topics Concern   Not on file  Social History Narrative   Are you right handed or left handed? Right   Are you currently employed ? No   What is your current occupation? None   Do you live at home alone? yes   Who lives with you? no   What type of home do you live in: 1 story or 2 story? Third floor apratment      Social Determinants of Health   Financial Resource Strain: Not on file  Food Insecurity: Low Risk  (12/17/2022)   [facility]   Hunger Vital Sign    Worried About Running Out of Food in the Last Year: Never true    Ran Out of Food in the Last Year: Never true  Transportation Needs: No Transportation Needs (12/17/2022)   [facility]   Transportation    In the past 12 months, has lack of reliable transportation kept you from medical appointments, meetings, work or from getting things needed for daily living? : No  Physical Activity: Not on file  Stress: Not on file  Social Connections: Not on file    His Allergies Are:  Allergies   Allergen Reactions   Ciprofloxacin Other (See Comments)    Spontaneous rupture of tendon   Earnestine Leys Officinalis] Anaphylaxis and Hives   Fluocinolone Other (See Comments)    "spontaneous rupture of tendons" per pt dr.   Marcelline Deist [Dapagliflozin]     Scrotal abscess    Prednisone Rash  :   His Current Medications Are:  Outpatient Encounter Medications  as of 08/24/2023  Medication Sig   albuterol (PROVENTIL) (2.5 MG/3ML) 0.083% nebulizer solution Take 3 mLs (2.5 mg total) by nebulization every 4 (four) hours as needed for wheezing or shortness of breath.   Albuterol-Budesonide 90-80 MCG/ACT AERO Inhale 2 Inhalations into the lungs every 4 (four) hours as needed. Maximum 12 inhalations per day.   ammonium lactate (LAC-HYDRIN) 12 % lotion Apply 1 application topically as needed for dry skin.   aspirin 325 MG tablet Take 1 tablet (325 mg total) by mouth daily.   Azelastine HCl 137 MCG/SPRAY SOLN USE 1 SPRAY(S) IN EACH NOSTRIL ONCE DAILY AS DIRECTED   baclofen (LIORESAL) 10 MG tablet Take 1 tablet by mouth three times daily as needed   bisoprolol (ZEBETA) 10 MG tablet Take 0.5 tablets (5 mg total) by mouth at bedtime. (Patient taking differently: Take 5 mg by mouth at bedtime. 5 mg)   budesonide (PULMICORT) 0.5 MG/2ML nebulizer solution Mix with Xopenex nebulizer solution every 6 hours as needed.   cimetidine (TAGAMET) 200 MG tablet Take 1 tablet daily   clindamycin (CLEOCIN T) 1 % lotion Apply topically as needed (rash).   EPINEPHrine 0.3 mg/0.3 mL IJ SOAJ injection Inject 0.3 mg into the muscle as needed for anaphylaxis.   esomeprazole (NEXIUM) 40 MG capsule TAKE 1 CAPSULE BY MOUTH TWICE DAILY BEFORE A MEAL   Evolocumab (REPATHA SURECLICK) 140 MG/ML SOAJ Inject 140 mg into the skin every 14 (fourteen) days.   fluticasone (FLONASE) 50 MCG/ACT nasal spray Use 1 spray(s) in each nostril once daily   Fluticasone-Umeclidin-Vilant (TRELEGY ELLIPTA) 200-62.5-25 MCG/ACT AEPB INHALE 1 PUFF INTO  LUNGS ONCE DAILY   furosemide (LASIX) 20 MG tablet Take 1 tablet (20 mg total) by mouth 3 (three) times a week.   gabapentin (NEURONTIN) 300 MG capsule TAKE 2 Casp am, 2 caps noon, 4 caps bedtime   hydrOXYzine (VISTARIL) 50 MG capsule Take 1 capsule (50 mg total) by mouth at bedtime. (Patient taking differently: Take 50 mg by mouth as needed.)   levalbuterol (XOPENEX HFA) 45 MCG/ACT inhaler Inhale 2 puffs into the lungs every 4 (four) hours as needed for wheezing.   levalbuterol (XOPENEX) 1.25 MG/3ML nebulizer solution Take 1.25 mg by nebulization every 4 (four) hours as needed for wheezing.   levothyroxine (SYNTHROID) 300 MCG tablet Take 300 mcg by mouth daily before breakfast.   LUER LOCK SAFETY SYRINGES 22G X 1-1/2" 3 ML MISC USE FOR SELF ADMINISTER OF TESTOSTERONE 2CC ONCE EVERY 2 WEEKS   Multiple Vitamin (MULTI-VITAMIN) tablet Take 1 tablet by mouth daily.   rosuvastatin (CRESTOR) 40 MG tablet Take 1 tablet by mouth once daily   sacubitril-valsartan (ENTRESTO) 97-103 MG Take 1 tablet by mouth 2 (two) times daily.   Suvorexant (BELSOMRA) 10 MG TABS TAKE 1 TABLET BY MOUTH AT BEDTIME   tamsulosin (FLOMAX) 0.4 MG CAPS capsule Take 1 capsule by mouth once daily   testosterone cypionate (DEPOTESTOSTERONE CYPIONATE) 200 MG/ML injection Inject 0.6 mLs (120 mg total) into the muscle once a week.   traZODone (DESYREL) 150 MG tablet TAKE 2 TABLETS BY MOUTH AT BEDTIME   VENTOLIN HFA 108 (90 Base) MCG/ACT inhaler INHALE 2 PUFFS BY MOUTH EVERY 6 HOURS AS NEEDED FOR SHORTNESS OF BREATH   doxycycline (VIBRAMYCIN) 100 MG capsule Take 1 with food once a day for 1 month (Patient not taking: Reported on 08/18/2023)   predniSONE (DELTASONE) 10 MG tablet Take 3 tabs (30mg ) twice daily for 3 days, then 2 tabs (20mg ) twice daily for  3 days, then 1 tab (10mg ) twice daily for 3 days, then STOP.   No facility-administered encounter medications on file as of 08/24/2023.  :   Review of Systems:  Out of a complete 14  point review of systems, all are reviewed and negative with the exception of these symptoms as listed below: Review of Systems  Neurological:        Pt here for sleep consult Pt snores,headaches,hypertension,fatigue ,2 strokes  Pt states sleep study 7 years ago Pt denies cpap machine    ESS FSS    Objective:  Neurological Exam  Physical Exam Physical Examination:   Vitals:   08/24/23 0901  BP: 113/67  Pulse: 93    General Examination: The patient is a very pleasant 47 y.o. male in no acute distress. He appears well-developed and well-nourished and well groomed.   HEENT: Normocephalic, atraumatic, pupils are equal, round and reactive to light, extraocular tracking is good without limitation to gaze excursion or nystagmus noted. Hearing is grossly intact. Face is symmetric with normal facial animation. Speech is clear with no dysarthria noted. There is no hypophonia. There is no lip, neck/head, jaw or voice tremor. Neck is supple with full range of passive and active motion. There are no carotid bruits on auscultation. Oropharynx exam reveals: severe mouth dryness, adequate dental hygiene, moderate airway crowding secondary to small airway entry, Mallampati class III, larger uvula noted.  Tongue protrudes centrally and palate elevates symmetrically, tonsillar size of about 1+ bilaterally.  Neck circumference 15 inches.  Minimal overbite noted.   Chest: Clear to auscultation without wheezing, rhonchi or crackles noted.  Heart: S1+S2+0, regular and normal without murmurs, rubs or gallops noted.   Abdomen: Soft, non-tender and non-distended.  Extremities: There is trace pitting edema in the distal lower extremities bilaterally.   Skin: Warm and dry without trophic changes noted.   Musculoskeletal: exam reveals limited range of motion both knees, soft knee braces in place bilaterally.    Neurologically:  Mental status: The patient is awake, alert and oriented in all 4 spheres. His  immediate and remote memory, attention, language skills and fund of knowledge are appropriate. There is no evidence of aphasia, agnosia, apraxia or anomia. Speech is clear with normal prosody and enunciation. Thought process is linear. Mood is normal and affect is normal.  Cranial nerves II - XII are as described above under HEENT exam.  Motor exam: Normal bulk, strength and tone is noted. There is no obvious action or resting tremor.  Fine motor skills and coordination: grossly intact.  Cerebellar testing: No dysmetria or intention tremor. There is no truncal or gait ataxia.  Sensory exam: intact to light touch in the upper and lower extremities.  Gait, station and balance: Stands without difficulty, he walks with 1 crutch.   Assessment and Plan:  In summary, Troy [name] is a very pleasant 47 y.o.-year old male with an underlying complex medical history of asthma, allergies, aortic atherosclerosis, hypertension, skin cancer, thyroid cancer with status post total thyroidectomy and neck dissection, hypothyroidism, hyperlipidemia, Hodgkin's lymphoma, hepatitis C, history of CVA, mood disorder and overweight state, whose history and physical exam are concerning for sleep disordered breathing, particularly obstructive sleep apnea (OSA).  While a laboratory attended sleep study is typically considered "gold standard" for evaluation of sleep disordered breathing, we mutually agreed to proceed with a home sleep test at this time.   I had a long chat with the patient about my findings and the diagnosis of sleep  apnea, particularly OSA, its prognosis and treatment options. We talked about medical/conservative treatments, surgical interventions and non-pharmacological approaches for symptom control. I explained, in particular, the risks and ramifications of untreated moderate to severe OSA, especially with respect to developing cardiovascular disease down the road, including congestive heart failure (CHF),  difficult to treat hypertension, cardiac arrhythmias (particularly A-fib), neurovascular complications including TIA, stroke and dementia. Even type 2 diabetes has, in part, been linked to untreated OSA. Symptoms of untreated OSA may include (but may not be limited to) daytime sleepiness, nocturia (i.e. frequent nighttime urination), memory problems, mood irritability and suboptimally controlled or worsening mood disorder such as depression and/or anxiety, lack of energy, lack of motivation, physical discomfort, as well as recurrent headaches, especially morning or nocturnal headaches. We talked about the importance of maintaining a healthy lifestyle and striving for healthy weight.  He was cautioned regarding the use of high-dose sedating medications patient regarding smoking marijuana daily as it can affect sleep adversely.  He has noticed that he has had difficulty falling asleep after smoking marijuana.  He avoids smoking marijuana in the evening close to bedtime. I recommended a sleep study at this time. I outlined the differences between a laboratory attended sleep study which is considered more comprehensive and accurate over the option of a home sleep test (HST); the latter may lead to underestimation of sleep disordered breathing in some instances and does not help with diagnosing upper airway resistance syndrome and is not accurate enough to diagnose primary central sleep apnea typically. I outlined possible surgical and non-surgical treatment options of OSA, including the use of a positive airway pressure (PAP) device (i.e. CPAP, AutoPAP/APAP or BiPAP in certain circumstances), a custom-made dental device (aka oral appliance, which would require a referral to a specialist dentist or orthodontist typically, and is generally speaking not considered for patients with full dentures or edentulous state), upper airway surgical options, such as traditional UPPP (which is not considered a first-line treatment)  or the Inspire device (hypoglossal nerve stimulator, which would involve a referral for consultation with an ENT surgeon, after careful selection, following inclusion criteria - also not first-line treatment). I explained the PAP treatment option to the patient in detail, as this is generally considered first-line treatment.  The patient indicated that he would be willing to try PAP therapy, if the need arises. I explained the importance of being compliant with PAP treatment, not only for insurance purposes but primarily to improve patient's symptoms symptoms, and for the patient's long term health benefit, including to reduce His cardiovascular risks longer-term.    We will pick up our discussion about the next steps and treatment options after testing.  We will keep him posted as to the test results by phone call and/or MyChart messaging where possible.  We will plan to follow-up in sleep clinic accordingly as well.  I answered all his questions today and the patient was in agreement.   I encouraged him to call with any interim questions, concerns, problems or updates or email [place] through MyChart.  Generally speaking, sleep test authorizations may take up to 2 weeks, sometimes less, sometimes longer, the patient is encouraged to get in touch with [place] if they do not hear back from the sleep lab staff directly within the next 2 weeks.  Thank you very much for allowing me to participate in the care of this nice patient. If I can be of any further assistance to you please do not hesitate to call me at  [phone].  Sincerely,   Huston Foley, MD, PhD

## 2023-08-26 ENCOUNTER — Other Ambulatory Visit: Payer: 59

## 2023-08-27 ENCOUNTER — Other Ambulatory Visit: Payer: Self-pay | Admitting: Orthopedic Surgery

## 2023-08-29 ENCOUNTER — Telehealth: Payer: Self-pay

## 2023-08-29 NOTE — Telephone Encounter (Signed)
Patient calling to get his telephone appt reschedule. Please advise

## 2023-08-29 NOTE — Telephone Encounter (Signed)
..     Pre-operative Risk Assessment    Patient Name: Troy [name]  DOB: 1975/11/20 MRN: [id number]      Request for Surgical Clearance    Procedure:   left acl reconstruction  Date of Surgery:  Clearance 09/15/23                                 Surgeon:  dr Thornell Mule Surgeon's Group or Practice Name:  [facility] Phone number:  [phone] Fax number:  [phone]   Type of Clearance Requested:   - Medical  - Pharmacy:  Hold Aspirin     Type of Anesthesia:   choice   Additional requests/questions:   last o/v 05/10/23, no return appt  Signed, Renee Ramus   08/29/2023, 11:34 AM

## 2023-08-29 NOTE — Telephone Encounter (Signed)
   Name: LAQUON SRIDHAR  DOB: [birth year]  MRN: [id number]  Primary Cardiologist: Christell Constant, MD  Chart reviewed as part of pre-operative protocol coverage. The patient has an upcoming visit scheduled with preoperative team on 09/13/2023 at which time clearance can be addressed in case there are any issues that would impact surgical recommendations.   I added preop FYI to appointment note so that provider is aware to address at time of outpatient visit.  Per office protocol the cardiology provider should forward their finalized clearance decision and recommendations regarding antiplatelet therapy to the requesting party below.    I will route this message as FYI to requesting party and remove this message from the preop box as separate preop APP input not needed at this time.   Please call with any questions.  Napoleon Form, Leodis Rains, NP  08/29/2023, 11:43 AM

## 2023-08-30 ENCOUNTER — Telehealth: Payer: Self-pay

## 2023-08-30 ENCOUNTER — Telehealth: Payer: Self-pay | Admitting: *Deleted

## 2023-08-30 NOTE — Telephone Encounter (Signed)
Ok per Bernadene Person, NP to add pt to 09/05/23 tele pre op appt due to procedure date of 09/16/23 and med hold. Med rec and consent are done.

## 2023-08-30 NOTE — Telephone Encounter (Signed)
Ok per Bernadene Person, NP to add pt to 09/05/23 tele pre op appt due to procedure date of 09/16/23 and med hold. Med rec and consent are done.     Patient Consent for Virtual Visit        Troy [name] has provided verbal consent on 08/30/2023 for a virtual visit (video or telephone).   CONSENT FOR VIRTUAL VISIT FOR:  Troy [name]  By participating in this virtual visit I agree to the following:  I hereby voluntarily request, consent and authorize [facility] and its employed or contracted physicians, physician assistants, nurse practitioners or other licensed health care professionals (the Practitioner), to provide me with telemedicine health care services (the "Services") as deemed necessary by the treating Practitioner. I acknowledge and consent to receive the Services by the Practitioner via telemedicine. I understand that the telemedicine visit will involve communicating with the Practitioner through live audiovisual communication technology and the disclosure of certain medical information by electronic transmission. I acknowledge that I have been given the opportunity to request an in-person assessment or other available alternative prior to the telemedicine visit and am voluntarily participating in the telemedicine visit.  I understand that I have the right to withhold or withdraw my consent to the use of telemedicine in the course of my care at any time, without affecting my right to future care or treatment, and that the Practitioner or I may terminate the telemedicine visit at any time. I understand that I have the right to inspect all information obtained and/or recorded in the course of the telemedicine visit and may receive copies of available information for a reasonable fee.  I understand that some of the potential risks of receiving the Services via telemedicine include:  Delay or interruption in medical evaluation due to technological equipment failure or  disruption; Information transmitted may not be sufficient (e.g. poor resolution of images) to allow for appropriate medical decision making by the Practitioner; and/or  In rare instances, security protocols could fail, causing a breach of personal health information.  Furthermore, I acknowledge that it is my responsibility to provide information about my medical history, conditions and care that is complete and accurate to the best of my ability. I acknowledge that Practitioner's advice, recommendations, and/or decision may be based on factors not within their control, such as incomplete or inaccurate data provided by me or distortions of diagnostic images or specimens that may result from electronic transmissions. I understand that the practice of medicine is not an exact science and that Practitioner makes no warranties or guarantees regarding treatment outcomes. I acknowledge that a copy of this consent can be made available to me via my patient portal [facility] MyChart), or I can request a printed copy by calling the office of [facility].    I understand that my insurance will be billed for this visit.   I have read or had this consent read to me. I understand the contents of this consent, which adequately explains the benefits and risks of the Services being provided via telemedicine.  I have been provided ample opportunity to ask questions regarding this consent and the Services and have had my questions answered to my satisfaction. I give my informed consent for the services to be provided through the use of telemedicine in my medical care

## 2023-08-30 NOTE — Telephone Encounter (Signed)
Transition Care Management Follow-up Telephone Call Date of discharge and from where: [facility] 11/4 How have you been since you were released from the hospital? Patient felt like his care was neglected a dismissed Patient is following up wit providers and scheduled for surgery Any questions or concerns? Yes  Items Reviewed: Did the pt receive and understand the discharge instructions provided? Yes  Medications obtained and verified? Yes Other? No  Any new allergies since your discharge? No  Dietary orders reviewed? No Do you have support at home? Yes     Follow up appointments reviewed:  PCP Hospital f/u appt confirmed? Yes  Scheduled to see  on  @ . Specialist Hospital f/u appt confirmed? Yes  Scheduled to see  on  @ . Are transportation arrangements needed? No  If their condition worsens, is the pt aware to call PCP or go to the Emergency Dept.? Yes Was the patient provided with contact information for the PCP's office or ED? Yes Was to pt encouraged to call back with questions or concerns? Yes

## 2023-08-31 ENCOUNTER — Other Ambulatory Visit: Payer: Self-pay | Admitting: Pharmacist

## 2023-08-31 ENCOUNTER — Encounter: Payer: Self-pay | Admitting: Hematology

## 2023-08-31 DIAGNOSIS — M170 Bilateral primary osteoarthritis of knee: Secondary | ICD-10-CM | POA: Diagnosis not present

## 2023-08-31 DIAGNOSIS — E785 Hyperlipidemia, unspecified: Secondary | ICD-10-CM

## 2023-08-31 NOTE — Telephone Encounter (Signed)
Called and spoke with patient to reschedule appointments for 11/26 to another day. Patient agree to 11/21 @1230  before surgery. Date and time confirmed for this week.

## 2023-08-31 NOTE — Telephone Encounter (Signed)
Per secure chat on 08/31/2023 we reached out to patient tin regards to rescheduling appointment Patient declined on rescheduling appointment

## 2023-09-01 ENCOUNTER — Other Ambulatory Visit: Payer: 59

## 2023-09-02 ENCOUNTER — Other Ambulatory Visit: Payer: Self-pay | Admitting: Internal Medicine

## 2023-09-04 NOTE — Progress Notes (Unsigned)
Patient ID: Troy [name]                 DOB: January 09, 1976                      MRN: [id number]     HPI: Troy [name] is a 47 y.o. male patient of Dr. Izora Ribas referred to pharmacy clinic for HF medication management, lipids, and hypertension. 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 Most recent LVEF 45% on 11/10/22. 04/22/23 CT chest incidentally showed aortic atherosclerosis. Pt has expressed concerns around continuity of care amongst his specialists and polypharmacy.   At initial pharmacist visit on 05/20/23, PCSK9i Repatha was initiated, as LDL-C was uncontrolled at 91 mg/dL on high intensity statin and ezetimibe. Ezetimibe was discontinued to minimize pill burden. Additionally, HF GDMT was adjusted - bisoprolol increased and transitioned lisinopril to [facility] with concern for ACEi induced cough and elevated BP. At follow-up on 06/03/23, SGLT2i Marcelline Deist was initiated and pt administered first dose of Repatha. Repeat BMP after transitioning to [facility] demonstrated K increase to 5.4 mEq/L - pt stopped taking MVI with 80 mg of K and decreased watermelon intake and K decreased to 5.0. He also drinks preworkout drink with 40 mg of K. Agreed to increase bisoprolol to 10 mg daily as Entresto could not be titrated. Repeat lipid panel 9/27 demonstrated LDL-C increased to 141 mg/dL on Repatha - along with increased LFTs- patient endorsed taking oral steroids for power lifting. Planned to repeat lipid panel and LFTs in December.   On 07/22/23 pt reported syncopal episode while lifting at the gym - bisoprolol reduced back to 5 mg daily. At the end of October, pt unfortunately developed a scrotal abscess which required ED visit and drainage. While abscess was not suspected to be Fournier's gangrene, SGLT2i was discontinued due to  location and severity of infection. Pt subsequently noted elevated BP to ~165/90 mmHg nightly after stopping [place]. Pt was instructed to increase Entresto to 97/103 mg BID and return for follow-up.   Today patient reports doing ok. He injured both of his knees power lifting (ACL and meniscus tears) - has upcoming surgery on 12/6. He reports that he did feel episode of "vertigo" on the elevator coming up to clinic today. Starts feeling lightheaded, sounds fade away, feels frozen - took a little while to start feeling normal again. He states that his happens occasionally - usually he starts to think that he is having another stroke. TIA workup in early 2024 negative. He reports he has some foggy memory 2/2 previous strokes. He just took higher dose of Entresto for the first time this morning (had not increased to 2 pills BID as instructed in previous MyChart message). Takes AM meds with breakfast, between 8-11am. He does have preworkout with ~200 mg of caffeine per scoop (typically uses most of one scoop). 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 When his BP is high he endorses weird headaches, feeling on edge. Denies ShOB, LE swelling (besides swelling related to knee injury). Checks weight daily. Has not needed furosemide recently   Current lipid meds: rosuvastatin 40 mg PO daily, Repatha (evolocumab) 140 mg subcutaneous every 14 days Previously tried: ezetimibe (discontinued  to minimize pill burden) Risk factors: aortic atherosclerosis on chest CT, hx of premature ASCVD (stroke) LDL-C goal: <55 mg/dL   Current CHF meds: bisoprolol 5 mg PO daily, Entresto (sacubitril-valsartan) 97-103 mg/dL PO BID, furosemide 20 mg PO PRN Previously tried: [place] (dapagliflozin) - developed scrotal abscess, lisinopril up to 40 mg (cough), bisoprolol 10 mg (decreased back to 5 mg after syncopal event), amlodipine 5 and 10 mg, verapmil  180 mg daily (stopped 2/2 HFmrEF), lisinopril-HCTZ  Adherence Assessment  Do you ever forget to take your medication? [] Yes [x] No  Do you ever skip doses due to side effects? [] Yes [x] No  Do you have trouble affording your medicines? [] Yes [x] No  Are you ever unable to pick up your medication due to transportation difficulties? [] Yes [x] No  Do you ever stop taking your medications because you don't believe they are helping? [] Yes [x] No  Do you check your weight daily? [x] Yes [] No   Adherence strategy: Pill box -AM/PM, fills up every Sunday  Insurance: UHC Dual Complete - Medicare/Medicaid  BP goal: <130/80  Family History:  Mother- breast cancer, colon cancer Father- multiple myeloma, parkinsonism Maternal grandmother- stroke  Social History: Former smoker (quit 213), hx of alcohol abuse but currently no use  Diet: endorses healthy diet - does take many supplements. Gorilla preworkout (200 mg caffeine per serving).   Exercise: competitive power lifter, former marathon runner  Home BP readings:   Highest - 185/98 ~a week and a half ago.  Rarely under 135/80 Lowest 106-112/62  Message about readings of 165/90 at night.   Wt Readings from Last 3 Encounters:  08/24/23 215 lb (97.5 kg)  08/18/23 205 lb (93 kg)  08/15/23 210 lb (95.3 kg)   BP Readings from Last 3 Encounters:  09/05/23 116/74  08/24/23 113/67  08/18/23 106/66   Pulse Readings from Last 3 Encounters:  09/05/23 89  08/24/23 93  08/18/23 91   Labs:      Latest Ref Rng & Units 08/06/2023    3:14 PM 07/08/2023   10:02 AM 06/09/2023   11:42 AM  BMP  Glucose 70 - 99 mg/dL 409  65  91   BUN 6 - 20 mg/dL 22  25  25    Creatinine 0.61 - 1.24 mg/dL 8.11  9.14  7.82   BUN/Creat Ratio 9 - 20  23  21    Sodium 135 - 145 mmol/L 133  136  139   Potassium 3.5 - 5.1 mmol/L 4.6  5.1  5.0   Chloride 98 - 111 mmol/L 102  96  104   CO2 22 - 32 mmol/L 24  24  19    Calcium 8.9 - 10.3 mg/dL 9.0  9.3  9.4         Latest Ref Rng & Units 07/08/2023   10:02 AM 08/05/2022    5:21 PM 03/25/2017    9:09 AM  Hepatic Function  Total Protein 6.0 - 8.5 g/dL 6.8  7.7  7.0   Albumin 4.1 - 5.1 g/dL 4.2  4.5  4.0   AST 0 - 40 IU/L 140  62  19   ALT 0 - 44 IU/L 186  80  14   Alk Phosphatase 44 - 121 IU/L 99  73  54   Total Bilirubin 0.0 - 1.2 mg/dL 1.0  0.3  0.5      Lipid Panel     Component Value Date/Time   CHOL 192 07/08/2023 1002   TRIG 163 (H) 07/08/2023 1002   HDL  21 (L) 07/08/2023 1002   CHOLHDL 9.1 (H) 07/08/2023 1002   CHOLHDL 3 05/05/2021 1137   VLDL 17.2 05/05/2021 1137   LDLCALC 141 (H) 07/08/2023 1002   LABVLDL 30 07/08/2023 1002    06/09/55- TC 138, TG 86, HDL 30, LDL 91 - on rosuvastatin 40 mg, ezetimibe 10 mg daily 05/05/21- TC 121, TG 86, HDL 42, LDL 61 - on rosuvastatin 40 mg  Renal function: CrCl cannot be calculated (Patient's most recent lab result is older than the maximum 21 days allowed.).  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   Chronic fatigue    Drug abuse (HCC)    Family history of breast cancer 05/19/2021   Family history of colon cancer in mother    GERD (gastroesophageal reflux disease)    +Barretts   Hematochezia    hemorrhoids   Hepatic steatosis    +hepatomegaly   History of CVA (cerebrovascular accident)    radiation-induced carotid damage/changes   History of hepatitis C    +treated   History of Hodgkin's lymphoma    age 89->mantle RT + splenectomy   History of splenectomy    Hyperlipidemia    Hypertension    Insomnia    Male hypogonadism    Moderate persistent asthma    Obesity, Class I, BMI 30-34.9    Papillary thyroid carcinoma (HCC) 2010   total thyroidectomy + neck dissection   Postsurgical hypothyroidism    Prediabetes    Pulmonary fibrosis (HCC)    ?rad induced?    Current Outpatient Medications on File Prior to Visit  Medication Sig  Dispense Refill   albuterol (PROVENTIL) (2.5 MG/3ML) 0.083% nebulizer solution Take 3 mLs (2.5 mg total) by nebulization every 4 (four) hours as needed for wheezing or shortness of breath. 75 mL 1   Albuterol-Budesonide 90-80 MCG/ACT AERO Inhale 2 Inhalations into the lungs every 4 (four) hours as needed. Maximum 12 inhalations per day. 10.7 g 2   ammonium lactate (LAC-HYDRIN) 12 % lotion Apply 1 application topically as needed for dry skin.     aspirin 325 MG tablet Take 1 tablet (325 mg total) by mouth daily. 30 tablet 0   Azelastine HCl 137 MCG/SPRAY SOLN USE 1 SPRAY(S) IN EACH NOSTRIL ONCE DAILY AS DIRECTED 30 mL 5   baclofen (LIORESAL) 10 MG tablet Take 1 tablet by mouth three times daily as needed 30 tablet 5   bisoprolol (ZEBETA) 10 MG tablet Take 0.5 tablets (5 mg total) by mouth at bedtime. (Patient taking differently: Take 5 mg by mouth at bedtime. 5 mg)     budesonide (PULMICORT) 0.5 MG/2ML nebulizer solution Mix with Xopenex nebulizer solution every 6 hours as needed. 120 mL 2   cimetidine (TAGAMET) 200 MG tablet Take 1 tablet daily 30 tablet 2   clindamycin (CLEOCIN T) 1 % lotion Apply topically as needed (rash).     diphenhydrAMINE HCl (BENADRYL PO) Take by mouth.     doxycycline (VIBRAMYCIN) 100 MG capsule Take 1 with food once a day for 1 month (Patient not taking: Reported on 09/05/2023) 30 capsule 1   EPINEPHrine 0.3 mg/0.3 mL IJ SOAJ injection Inject 0.3 mg into the muscle as needed for anaphylaxis. 2 each 2   esomeprazole (NEXIUM) 40 MG capsule TAKE 1 CAPSULE BY MOUTH TWICE DAILY BEFORE A MEAL 180 capsule 3   Evolocumab (REPATHA SURECLICK) 140 MG/ML  SOAJ Inject 140 mg into the skin every 14 (fourteen) days. 6 mL 3   fluticasone (FLONASE) 50 MCG/ACT nasal spray Use 1 spray(s) in each nostril once daily 16 g 11   Fluticasone-Umeclidin-Vilant (TRELEGY ELLIPTA) 200-62.5-25 MCG/ACT AEPB INHALE 1 PUFF INTO LUNGS ONCE DAILY 60 each 5   furosemide (LASIX) 20 MG tablet Take 1 tablet (20  mg total) by mouth 3 (three) times a week. 36 tablet 3   gabapentin (NEURONTIN) 300 MG capsule TAKE 2 Casp am, 2 caps noon, 4 caps bedtime 240 capsule 3   hydrOXYzine (VISTARIL) 50 MG capsule Take 1 capsule (50 mg total) by mouth at bedtime. (Patient taking differently: Take 50 mg by mouth as needed.) 90 capsule 3   levalbuterol (XOPENEX HFA) 45 MCG/ACT inhaler Inhale 2 puffs into the lungs every 4 (four) hours as needed for wheezing. 2 each 4   levalbuterol (XOPENEX) 1.25 MG/3ML nebulizer solution Take 1.25 mg by nebulization every 4 (four) hours as needed for wheezing. 120 mL 1   levothyroxine (SYNTHROID) 300 MCG tablet Take 300 mcg by mouth daily before breakfast.     LUER LOCK SAFETY SYRINGES 22G X 1-1/2" 3 ML MISC USE FOR SELF ADMINISTER OF TESTOSTERONE 2CC ONCE EVERY 2 WEEKS     Melatonin 10 MG TABS Take by mouth at bedtime.     Multiple Vitamin (MULTI-VITAMIN) tablet Take 1 tablet by mouth daily.     rosuvastatin (CRESTOR) 40 MG tablet Take 1 tablet by mouth once daily 90 tablet 3   sacubitril-valsartan (ENTRESTO) 97-103 MG Take 1 tablet by mouth 2 (two) times daily. 60 tablet 11   Suvorexant (BELSOMRA) 10 MG TABS TAKE 1 TABLET BY MOUTH AT BEDTIME 30 tablet 5   tamsulosin (FLOMAX) 0.4 MG CAPS capsule Take 1 capsule by mouth once daily 30 capsule 11   testosterone cypionate (DEPOTESTOSTERONE CYPIONATE) 200 MG/ML injection Inject 0.6 mLs (120 mg total) into the muscle once a week. 10 mL 0   traZODone (DESYREL) 150 MG tablet TAKE 2 TABLETS BY MOUTH AT BEDTIME 180 tablet 3   VENTOLIN HFA 108 (90 Base) MCG/ACT inhaler INHALE 2 PUFFS BY MOUTH EVERY 6 HOURS AS NEEDED FOR SHORTNESS OF BREATH 18 g 1   No current facility-administered medications on file prior to visit.    Allergies  Allergen Reactions   Ciprofloxacin Other (See Comments)    Spontaneous rupture of tendon   Earnestine Leys Officinalis] Anaphylaxis and Hives   Fluocinolone Other (See Comments)    "spontaneous rupture of tendons"  per pt dr.   Marcelline Deist [Dapagliflozin]     abscess    Prednisone Rash     Assessment/Plan:  HFrEF (heart failure with reduced ejection fraction) (HCC) Assessment - NYHA Class I-II Stage C HFmrEF (EF 40-45% on 11/10/22) on maximally tolerated GDMT. - BP controlled in clinic today below goal <130/80 mmHg, despite pt reporting elevated BP at home up to 185/98 - though unclear whether this was a resting BP.  - Noted BP differential between arms, but both readings controlled after 1 dose of high-dose ARNI this AM. - Pt appears euvolemic with no s/sx of fluid overload - not needing PRN loop diuretic.  - Pt has history of hyperkalemia on Entresto 49-51 mg BID, which resolved after he stopped his multivitamin and decreased watermelon intake. Last K 4.6 meq/L, Scr stable at 0.91. Will closely monitor one week after increasing to Entresto 97-103 mg BID.  - Symptoms of vertigo somewhat concerning for hypotension. Will reevaluate home BP  readings in ~1 week to determine whether pt should continue higher dose of Entresto. If hyperkalemia prevents Entresto use, can consider hydrochlorothiazide for additional BP control. Will be difficult to initiate spironolactone with hx of hyperkalemia.   Plan - Continue Entresto (sacubitril-valsartan) 97-103 mg PO BID - Continue bisoprolol 5 mg PO daily - Continue furosemide 20 mg PO PRN - Continue to monitor weights and BP daily. Educated on proper technique for home BP monitoring.  - Obtain repeat BMP in 1 weeks. Will call patient to discuss results, request for him to bring home BP monitor to clinic.    Hyperlipidemia Assessment - LDL-C of 141 mg/dL uncontrolled above goal <55 mg/dL given incidental finding of premature aortic atherosclerosis on chest CT.  - LDL-C worsened from 91 to 956 after starting Repatha - though pt admitted to taking oral steroids for powerlifting which likely negatively cholesterol and contributed to worsened LFTs. Pt denies current steroid  use - will plan to recheck lipid panel and CMET in ~1 week.  - Pt is tolerating high intensity statin, rosuvastatin 40 mg daily. Discontinued ezetimibe when starting mAb PCSK9i to minimize pill burden.  Plan - Continue rosuvastatin 40 mg PO daily and Repatha (evolocumab) 140 mg subcutaneous every 14 days - Obtain repeat lipid panel and CMET in ~1 week - Will communicate results to patient via telephone   Patient requested to draw CBC today as he had to cancel his hematology appointment for Q71mo blood draw for polycythemia. Lab order placed.   Return visit in 6 week.  Thank you   Troy [name], PharmD PGY1 Pharmacy Resident  Olene Floss, Pharm.D, BCACP, BCPS, CPP [facility] [address], [place], [place] [address]  Phone: [phone]; Fax: [phone]

## 2023-09-05 ENCOUNTER — Ambulatory Visit: Payer: 59 | Attending: Physician Assistant | Admitting: Pharmacist

## 2023-09-05 ENCOUNTER — Ambulatory Visit (INDEPENDENT_AMBULATORY_CARE_PROVIDER_SITE_OTHER): Payer: 59

## 2023-09-05 VITALS — BP 116/74 | HR 89

## 2023-09-05 DIAGNOSIS — I5020 Unspecified systolic (congestive) heart failure: Secondary | ICD-10-CM | POA: Diagnosis not present

## 2023-09-05 DIAGNOSIS — D751 Secondary polycythemia: Secondary | ICD-10-CM

## 2023-09-05 DIAGNOSIS — Z01810 Encounter for preprocedural cardiovascular examination: Secondary | ICD-10-CM | POA: Diagnosis not present

## 2023-09-05 DIAGNOSIS — D500 Iron deficiency anemia secondary to blood loss (chronic): Secondary | ICD-10-CM | POA: Diagnosis not present

## 2023-09-05 DIAGNOSIS — E785 Hyperlipidemia, unspecified: Secondary | ICD-10-CM

## 2023-09-05 LAB — CBC
Hematocrit: 49.9 % (ref 37.5–51.0)
Hemoglobin: 17.3 g/dL (ref 13.0–17.7)
MCH: 33.2 pg — ABNORMAL HIGH (ref 26.6–33.0)
MCHC: 34.7 g/dL (ref 31.5–35.7)
MCV: 96 fL (ref 79–97)
Platelets: 227 10*3/uL (ref 150–450)
RBC: 5.21 x10E6/uL (ref 4.14–5.80)
RDW: 17.2 % — ABNORMAL HIGH (ref 11.6–15.4)
WBC: 11.6 10*3/uL — ABNORMAL HIGH (ref 3.4–10.8)

## 2023-09-05 NOTE — Assessment & Plan Note (Signed)
Assessment - LDL-C of 141 mg/dL uncontrolled above goal <55 mg/dL given incidental finding of premature aortic atherosclerosis on chest CT.  - LDL-C worsened from 91 to 161 after starting Repatha - though pt admitted to taking oral steroids for powerlifting which likely negatively cholesterol and contributed to worsened LFTs. Pt denies current steroid use - will plan to recheck lipid panel and CMET in ~1 week.  - Pt is tolerating high intensity statin, rosuvastatin 40 mg daily. Discontinued ezetimibe when starting mAb PCSK9i to minimize pill burden.  Plan - Continue rosuvastatin 40 mg PO daily and Repatha (evolocumab) 140 mg subcutaneous every 14 days - Obtain repeat lipid panel and CMET in ~1 week - Will communicate results to patient via telephone

## 2023-09-05 NOTE — Progress Notes (Signed)
Virtual Visit via Telephone Note   Because of Troy [name]'s co-morbid illnesses, he is at least at moderate risk for complications without adequate follow up.  This format is felt to be most appropriate for this patient at this time.  The patient did not have access to video technology/had technical difficulties with video requiring transitioning to audio format only (telephone).  All issues noted in this document were discussed and addressed.  No physical exam could be performed with this format.  Please refer to the patient's chart for his consent to telehealth for [facility].  Evaluation Performed:  Preoperative cardiovascular risk assessment _____________   Date:  09/05/2023   Patient ID:  Troy [name], DOB 1976/03/31, MRN [id number] Patient Location:  Home Provider location:   Office  Primary Care Provider:  Loyola Mast, MD Primary Cardiologist:  Christell Constant, MD  Chief Complaint / Patient Profile   47 y.o. y/o male with a h/o Hodgkin's lymphoma status post high-dose radiation, prior pulmonary fibrosis and asthma with a history of tobacco abuse, current marijuana use, asthma, and hypertension with prior history of embolic strokes who is pending total ACL reconstruction on the left knee and presents today for telephonic preoperative cardiovascular risk assessment.  History of Present Illness    Troy [name] is a 47 y.o. male who presents via audio/video conferencing for a telehealth visit today.  Pt was last seen in cardiology clinic on 05/10/23 by Jacolyn Reedy, PA-C.  At that time Troy [name] was doing well.  The patient is now pending procedure as outlined above. Since his last visit, he has not had any heart related issues.  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  Unfortunately, needs ACL reconstruction.  He does meet minimal METS on the DASI.  He already knows to hold his fish oil and aspirin 81 mg x 5 to  7 days prior to procedure and resume when medically safe to do so.  Reports no shortness of breath nor dyspnea on exertion. Reports no chest pain, pressure, or tightness. No edema, orthopnea, PND. Reports no palpitations.    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   Chronic fatigue    Drug abuse (HCC)    Family history of breast cancer 05/19/2021   Family history of colon cancer in mother    GERD (gastroesophageal reflux disease)    +Barretts   Hematochezia    hemorrhoids   Hepatic steatosis    +hepatomegaly   History of CVA (cerebrovascular accident)    radiation-induced carotid damage/changes   History of hepatitis C    +treated   History of Hodgkin's lymphoma    age 47->mantle RT + splenectomy   History of splenectomy    Hyperlipidemia    Hypertension    Insomnia    Male hypogonadism    Moderate persistent asthma    Obesity, Class I, BMI 30-34.9    Papillary thyroid carcinoma (HCC) 2010   total thyroidectomy + neck dissection   Postsurgical hypothyroidism    Prediabetes    Pulmonary fibrosis (HCC)    ?rad induced?   Past Surgical History:  Procedure Laterality Date   COLONOSCOPY  07/04/2019   done for hematochezia and FH col ca-mother: Mod int hem w/inflamm.  No adenomatous polyps.  INCISIONAL HERNIA REPAIR  06/2019   RUQ (prev lap append site)   LAPAROSCOPIC APPENDECTOMY N/A 03/25/2017   Procedure: APPENDECTOMY LAPAROSCOPIC; EXTENSIVE LYSIS OF ADHESIONS FROM PRIOR RADIATION AND SURGERY;  Surgeon: Luretha Murphy, MD;  Location: [facility] ORS;  Service: General;  Laterality: N/A;   PARATHYROIDECTOMY     SPLENECTOMY     TEE WITHOUT CARDIOVERSION N/A 07/18/2015   Procedure: TRANSESOPHAGEAL ECHOCARDIOGRAM (TEE);  Surgeon: [name], MD;  Location: [facility] ENDOSCOPY;  Service: Cardiovascular;  Laterality: N/A;   THYROIDECTOMY       Allergies  Allergies  Allergen Reactions   Ciprofloxacin Other (See Comments)    Spontaneous rupture of tendon   Earnestine Leys Officinalis] Anaphylaxis and Hives   Fluocinolone Other (See Comments)    "spontaneous rupture of tendons" per pt dr.   Marcelline Deist [Dapagliflozin]     abscess    Prednisone Rash    Home Medications    Prior to Admission medications   Medication Sig Start Date End Date Taking? Authorizing Provider  albuterol (PROVENTIL) (2.5 MG/3ML) 0.083% nebulizer solution Take 3 mLs (2.5 mg total) by nebulization every 4 (four) hours as needed for wheezing or shortness of breath. 05/13/23   Alfonse Spruce, MD  Albuterol-Budesonide 90-80 MCG/ACT AERO Inhale 2 Inhalations into the lungs every 4 (four) hours as needed. Maximum 12 inhalations per day. 03/29/23   Loyola Mast, MD  ammonium lactate (LAC-HYDRIN) 12 % lotion Apply 1 application topically as needed for dry skin.    [provider]  aspirin 325 MG tablet Take 1 tablet (325 mg total) by mouth daily. 07/18/15   Ghimire, Werner Lean, MD  Azelastine HCl 137 MCG/SPRAY SOLN USE 1 SPRAY(S) IN EACH NOSTRIL ONCE DAILY AS DIRECTED 06/09/23   Alfonse Spruce, MD  baclofen (LIORESAL) 10 MG tablet Take 1 tablet by mouth three times daily as needed 05/10/22   Loyola Mast, MD  bisoprolol (ZEBETA) 10 MG tablet Take 0.5 tablets (5 mg total) by mouth at bedtime. Patient taking differently: Take 5 mg by mouth at bedtime. 5 mg 07/22/23   Chandrasekhar, Mahesh A, MD  budesonide (PULMICORT) 0.5 MG/2ML nebulizer solution Mix with Xopenex nebulizer solution every 6 hours as needed. 04/08/23   Alfonse Spruce, MD  cimetidine (TAGAMET) 200 MG tablet Take 1 tablet daily 05/24/23   Terri Piedra, DO  clindamycin (CLEOCIN T) 1 % lotion Apply topically as needed (rash). 05/25/22   [provider]  diphenhydrAMINE HCl (BENADRYL PO) Take by mouth.    [provider]  doxycycline (VIBRAMYCIN) 100 MG  capsule Take 1 with food once a day for 1 month Patient not taking: Reported on 09/05/2023 07/20/23   Terri Piedra, DO  EPINEPHrine 0.3 mg/0.3 mL IJ SOAJ injection Inject 0.3 mg into the muscle as needed for anaphylaxis. 04/07/23   Alfonse Spruce, MD  esomeprazole (NEXIUM) 40 MG capsule TAKE 1 CAPSULE BY MOUTH TWICE DAILY BEFORE A MEAL 01/17/23   Loyola Mast, MD  Evolocumab (REPATHA SURECLICK) 140 MG/ML SOAJ Inject 140 mg into the skin every 14 (fourteen) days. 05/20/23   Christell Constant, MD  fluticasone Aleda Grana) 50 MCG/ACT nasal spray Use 1 spray(s) in each nostril once daily 03/08/22   Loyola Mast, MD  Fluticasone-Umeclidin-Vilant (TRELEGY ELLIPTA) 200-62.5-25 MCG/ACT AEPB INHALE 1 PUFF INTO LUNGS ONCE DAILY 06/07/23   Alfonse Spruce, MD  furosemide (LASIX) 20 MG tablet Take 1 tablet (20 mg total) by mouth 3 (three) times a week.  12/31/22   Christell Constant, MD  gabapentin (NEURONTIN) 300 MG capsule TAKE 2 Casp am, 2 caps noon, 4 caps bedtime 07/25/23   Loyola Mast, MD  hydrOXYzine (VISTARIL) 50 MG capsule Take 1 capsule (50 mg total) by mouth at bedtime. Patient taking differently: Take 50 mg by mouth as needed. 02/02/21   Loyola Mast, MD  levalbuterol [facility] HFA) 45 MCG/ACT inhaler Inhale 2 puffs into the lungs every 4 (four) hours as needed for wheezing. 05/31/23   Alfonse Spruce, MD  levalbuterol Pauline Aus) 1.25 MG/3ML nebulizer solution Take 1.25 mg by nebulization every 4 (four) hours as needed for wheezing. 04/08/23   Alfonse Spruce, MD  levothyroxine (SYNTHROID) 300 MCG tablet Take 300 mcg by mouth daily before breakfast.    [provider]  LUER LOCK SAFETY SYRINGES 22G X 1-1/2" 3 ML MISC USE FOR SELF ADMINISTER OF TESTOSTERONE 2CC ONCE EVERY 2 WEEKS 09/30/20   [provider]  Melatonin 10 MG TABS Take by mouth at bedtime.    [provider]  Multiple Vitamin (MULTI-VITAMIN) tablet Take 1 tablet by mouth  daily. 07/24/09   [provider]  rosuvastatin (CRESTOR) 40 MG tablet Take 1 tablet by mouth once daily 03/20/23   Loyola Mast, MD  sacubitril-valsartan (ENTRESTO) 97-103 MG Take 1 tablet by mouth 2 (two) times daily. 08/23/23   Christell Constant, MD  Suvorexant (BELSOMRA) 10 MG TABS TAKE 1 TABLET BY MOUTH AT BEDTIME 08/22/23   Loyola Mast, MD  tamsulosin [facility]) 0.4 MG CAPS capsule Take 1 capsule by mouth once daily 09/18/22   Loyola Mast, MD  testosterone cypionate (DEPOTESTOSTERONE CYPIONATE) 200 MG/ML injection Inject 0.6 mLs (120 mg total) into the muscle once a week. 06/02/22   Loyola Mast, MD  traZODone (DESYREL) 150 MG tablet TAKE 2 TABLETS BY MOUTH AT BEDTIME 03/21/23   Loyola Mast, MD  VENTOLIN HFA [phone] Base) MCG/ACT inhaler INHALE 2 PUFFS BY MOUTH EVERY 6 HOURS AS NEEDED FOR SHORTNESS OF BREATH 04/08/23   Alfonse Spruce, MD    Physical Exam    Vital Signs:  Troy [name] does not have vital signs available for review today.  Given telephonic nature of communication, physical exam is limited. AAOx3. NAD. Normal affect.  Speech and respirations are unlabored.  Accessory Clinical Findings    None  Assessment & Plan    1.  Preoperative Cardiovascular Risk Assessment:  Troy [name] perioperative risk of a major cardiac event is 6.6% according to the Revised Cardiac Risk Index (RCRI).  Therefore, he is at high risk for perioperative complications.   His functional capacity is good at 6.36 METs according to the [place] Activity Status Index (DASI). Recommendations: According to ACC/AHA guidelines, no further cardiovascular testing needed.  The patient may proceed to surgery at acceptable risk.   Antiplatelet and/or Anticoagulation Recommendations: Aspirin can be held for 5-7 days prior to his surgery.  Please resume Aspirin post operatively when it is felt to be safe from a bleeding standpoint.    The patient was advised that if he develops  new symptoms prior to surgery to contact our office to arrange for a follow-up visit, and he verbalized understanding.   A copy of this note will be routed to requesting surgeon.  Time:   Today, I have spent 5 minutes with the patient with telehealth technology discussing medical history, symptoms, and management plan.     Sharlene Dory, PA-C  09/05/2023, 10:42 AM

## 2023-09-05 NOTE — Patient Instructions (Signed)
It was nice to meet you today!  Continue Entresto 97-103 mg by mouth twice daily, bisoprolol 5 mg daily for your heart and rosuvastatin 40 mg daily and Repatha 140 mg every 14 days for cholesterol.   Come back on December 2nd for repeat (fasting) labs.   Let us know if you continue to notice elevated blood pressures. Your goal is less than 130/80.

## 2023-09-05 NOTE — Assessment & Plan Note (Addendum)
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 - BP controlled in clinic today below goal <130/80 mmHg, despite pt reporting elevated BP at home up to 185/98 - though unclear whether this was a resting BP.  - Noted BP differential between arms, but both readings controlled after 1 dose of high-dose ARNI this AM. - Pt appears euvolemic with no s/sx of fluid overload - not needing PRN loop diuretic.  - Pt has history of hyperkalemia on Entresto 49-51 mg BID, which resolved after he stopped his multivitamin and decreased watermelon intake. Last K 4.6 meq/L, Scr stable at 0.91. Will closely monitor one week after increasing to Entresto 97-103 mg BID.  - Symptoms of vertigo somewhat concerning for hypotension. Will reevaluate home BP readings in ~1 week to determine whether pt should continue higher dose of Entresto. If hyperkalemia prevents Entresto use, can consider hydrochlorothiazide for additional BP control. Will be difficult to initiate spironolactone with hx of hyperkalemia.   Plan - Continue Entresto (sacubitril-valsartan) 97-103 mg PO BID - Continue bisoprolol 5 mg PO daily - Continue furosemide 20 mg PO PRN - Continue to monitor weights and BP daily. Educated on proper technique for home BP monitoring.  - Obtain repeat BMP in 1 weeks. Will call patient to discuss results, request for him to bring home BP monitor to clinic.

## 2023-09-06 ENCOUNTER — Encounter: Payer: Self-pay | Admitting: Dermatology

## 2023-09-06 ENCOUNTER — Other Ambulatory Visit: Payer: 59

## 2023-09-06 ENCOUNTER — Ambulatory Visit: Payer: 59 | Admitting: Dermatology

## 2023-09-06 ENCOUNTER — Encounter: Payer: Self-pay | Admitting: Family Medicine

## 2023-09-06 VITALS — BP 106/70 | HR 90 | Temp 98.7°F

## 2023-09-06 DIAGNOSIS — C4491 Basal cell carcinoma of skin, unspecified: Secondary | ICD-10-CM

## 2023-09-06 DIAGNOSIS — C4441 Basal cell carcinoma of skin of scalp and neck: Secondary | ICD-10-CM

## 2023-09-06 DIAGNOSIS — L821 Other seborrheic keratosis: Secondary | ICD-10-CM | POA: Diagnosis not present

## 2023-09-06 DIAGNOSIS — L814 Other melanin hyperpigmentation: Secondary | ICD-10-CM

## 2023-09-06 DIAGNOSIS — D492 Neoplasm of unspecified behavior of bone, soft tissue, and skin: Secondary | ICD-10-CM | POA: Diagnosis not present

## 2023-09-06 DIAGNOSIS — D485 Neoplasm of uncertain behavior of skin: Secondary | ICD-10-CM

## 2023-09-06 DIAGNOSIS — L579 Skin changes due to chronic exposure to nonionizing radiation, unspecified: Secondary | ICD-10-CM

## 2023-09-06 DIAGNOSIS — W908XXA Exposure to other nonionizing radiation, initial encounter: Secondary | ICD-10-CM

## 2023-09-06 HISTORY — DX: Basal cell carcinoma of skin, unspecified: C44.91

## 2023-09-06 MED ORDER — MUPIROCIN 2 % EX OINT: 1.0000 | TOPICAL_OINTMENT | Freq: Every day | CUTANEOUS | 0 refills | Status: DC

## 2023-09-06 NOTE — Patient Instructions (Signed)

## 2023-09-06 NOTE — Progress Notes (Signed)
Follow-Up Visit   Subjective  Troy [name] is a 47 y.o. male who presents for the following: Mohs of Nodular BCC of the right posterior neck/shoulder, referred by Dr. Onalee Hua.  The following portions of the chart were reviewed this encounter and updated as appropriate: medications, allergies, medical history  Review of Systems:  No other skin or systemic complaints except as noted in HPI or Assessment and Plan.  Objective  Well appearing patient in no apparent distress; mood and affect are within normal limits.  A focused examination was performed of the following areas: Posterior neck Relevant physical exam findings are noted in the Assessment and Plan.   posterior neck Pink pearly papule or plaque with arborizing vessels.          Left Lower Leg Crusted papule         Assessment & Plan   Basal cell carcinoma (BCC), unspecified site posterior neck  Mohs surgery  Consent obtained: written  Anticoagulation: Is the patient taking prescription anticoagulant and/or aspirin prescribed/recommended by a physician? No   Was the anticoagulation regimen changed prior to Mohs? No    Procedure Details: Timeout: pre-procedure verification complete Procedure Prep: patient was prepped and draped in usual sterile fashion Prep type: chlorhexidine Surgical site (from skin exam): posterior neck Pre-operative length (cm): 1.6 Pre-operative width (cm): 1.5  Micrographic Surgery Details: Post-operative length (cm): 2.2 Post-operative width (cm): 2 Number of Mohs stages: 1  Related Medications mupirocin ointment (BACTROBAN) 2 % Apply 1 Application topically daily. Covered under a bandage  Neoplasm of uncertain behavior of skin Left Lower Leg  Skin / nail biopsy Type of biopsy: tangential   Informed consent: discussed and consent obtained   Timeout: patient name, date of birth, surgical site, and procedure verified   Procedure prep:  Patient was prepped and  draped in usual sterile fashion Prep type:  Isopropyl alcohol Anesthesia: the lesion was anesthetized in a standard fashion   Anesthetic:  1% lidocaine w/ epinephrine 1-100,000 local infiltration Instrument used: DermaBlade   Hemostasis achieved with: pressure and electrodesiccation   Outcome: patient tolerated procedure well   Post-procedure details: wound care instructions given    Specimen 1 - Surgical pathology Differential Diagnosis: R/O NMSC vs other  Check Margins: No   Return in about 4 weeks (around 10/04/2023) for wound check.  Owens Shark, CMA, am acting as scribe for Gwenith Daily, MD.    09/06/2023  HISTORY OF PRESENT ILLNESS  Troy [name] is seen in consultation at the request of Dr. Onalee Hua for biopsy-proven Nodular Basal Cell Carcinoma of the right posterior neck/shoulder. They note that the area has been present for about 4-6 months increasing in size with time.  There is no history of previous treatment.  Reports no other new or changing lesions and has no other complaints today.  Medications and allergies: see patient chart.  Review of systems: Reviewed 8 systems and notable for the above skin cancer.  All other systems reviewed are unremarkable/negative, unless noted in the HPI. Past medical history, surgical history, family history, social history were also reviewed and are noted in the chart/questionnaire.    PHYSICAL EXAMINATION  General: Well-appearing, in no acute distress, alert and oriented x 4. Vitals reviewed in chart (if available).   Skin: Exam reveals a 1.6 x 1.5 cm erythematous papule and biopsy scar on the right posterior neck/shoulder. There are rhytids, telangiectasias, and lentigines, consistent with photodamage.  Biopsy report(s) reviewed, confirming the diagnosis.   ASSESSMENT  1) Nodular Basal Cell Carcinoma of the right posterior neck/shoulder 2) photodamage 3) solar lentigines   Neoplasm of skin on left ankle  PLAN   1.  Due to location, size, histology, or recurrence and the likelihood of subclinical extension as well as the need to conserve normal surrounding tissue, the patient was deemed acceptable for Mohs micrographic surgery (MMS).  The nature and purpose of the procedure, associated benefits and risks including recurrence and scarring, possible complications such as pain, infection, and bleeding, and alternative methods of treatment if appropriate were discussed with the patient during consent. The lesion location was verified by the patient, by reviewing previous notes, pathology reports, and by photographs as well as angulation measurements if available.  Informed consent was reviewed and signed by the patient, and timeout was performed at 8:45 AM. See op note below.  2. For the photodamage and solar lentigines, sun protection discussed/information given on OTC sunscreens, and we recommend continued regular follow-up with primary dermatologist every 6 months or sooner for any growing, bleeding, or changing lesions. 3. Prognosis and future surveillance discussed. 4. Letter with treatment outcome sent to referring provider. 5. Pain acetaminophen/ibuprofen  Shave biopsy of lesion on left ankle  MOHS MICROGRAPHIC SURGERY AND RECONSTRUCTION  Initial size:   1.6 x 1.5 cm Surgical defect/wound size: 2.2 x 2.0 cm Anesthesia:    0.33% lidocaine with 1:200,000 epinephrine EBL:    <5 mL Complications:  None Repair type:   Secondary Intention  Stages: 1  STAGE I: Anesthesia achieved with 0.5% lidocaine with 1:200,000 epinephrine. ChloraPrep applied. 1 section(s) excised using Mohs technique (this includes total peripheral and deep tissue margin excision and evaluation with frozen sections, excised and interpreted by the same physician). The tumor was first debulked and then excised with an approx. 2mm margin.  Hemostasis was achieved with electrocautery as needed.  The specimen was then oriented, subdivided/relaxed,  inked, and processed using Mohs technique.    Frozen section analysis revealed a clear deep and peripheral margin.  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 After reviewing the advantages and disadvantages of each, we agreed on second intention healing as appropriate.   The surgical site was then lightly scrubbed with sterile, saline-soaked gauze.  The area was bandaged using Vaseline ointment, non-adherent gauze, gauze pads, and tape to provide an adequate pressure dressing.   The patient tolerated the procedure well, was given detailed written and verbal wound care instructions, and was discharged in good condition.  The patient will follow-up in 4 weeks and as scheduled with primary dermatologist.  - mupirocin ointment (BACTROBAN) 2 %; Apply 1 Application topically daily. Covered under a bandage  Dispense: 22 g; Refill: 0   Documentation: I have reviewed the above documentation for accuracy and completeness, and I agree with the above.  Gwenith Daily, MD

## 2023-09-07 ENCOUNTER — Encounter (HOSPITAL_BASED_OUTPATIENT_CLINIC_OR_DEPARTMENT_OTHER): Payer: Self-pay | Admitting: Orthopedic Surgery

## 2023-09-07 ENCOUNTER — Other Ambulatory Visit: Payer: Self-pay

## 2023-09-07 ENCOUNTER — Telehealth: Payer: 59 | Admitting: Nurse Practitioner

## 2023-09-07 DIAGNOSIS — F339 Major depressive disorder, recurrent, unspecified: Secondary | ICD-10-CM | POA: Diagnosis not present

## 2023-09-07 NOTE — Progress Notes (Signed)
   09/07/23 1229  Pre-op Phone Call  Surgery Date Verified 09/16/23  Arrival Time Verified 0900  Surgery Location Verified [facility] [place]  Medical History Reviewed Yes  Is the patient taking a GLP-1 receptor agonist? No  Does the patient have diabetes? No diagnosis of diabetes  Do you have a history of heart problems? Yes  Cardiologist Name Dr. Izora Ribas  Have you ever had tests on your heart? Yes  What cardiac tests were performed? Echo;EKG  Results viewable: [facility] Media Tab  Does patient have other implanted devices? No  Patient Teaching Enhanced Recovery;Pre / Post Procedure  Patient educated about smoking cessation 24 hours prior to surgery. (S)  Yes (THC)  Patient verbalizes understanding of bowel prep? N/A  THA/TKA patients only:  By your surgery date, will you have been taking narcotics for 90 days or greater? No  Med Rec Completed Yes  Take the Following Meds the Morning of Surgery (S)  Take asthma meds, Gabapentin, synthroid, nexium, cimetidine  Recent  Lab Work, EKG, CXR? No  NPO (Including gum & candy) After midnight  Allowed clear liquids Water;Black Coffee Only (no creamer, milk or cream including half and half);Gatorade  (diabetics please choose diet or no sugar options)  Patient instructed to stop clear liquids including Carb loading drink at: 0800  Stop Solids, Milk, Candy, and Gum STARTING AT MIDNIGHT  Did patient view EMMI videos? No  Responsible adult to drive and be with you for 24 hours? Yes  Name & Phone Number for Ride/Caregiver mother  No Jewelry, money, nail polish or make-up.  No lotions, powders, perfumes. No shaving  48 hrs. prior to surgery. Yes  Contacts, Dentures & Glasses Will Have to be Removed Before OR. Yes  Please bring your ID and Insurance Card the morning of your surgery. (Surgery Centers Only) Yes  Bring any papers or x-rays with you that your surgeon gave you. Yes  Instructed to contact the location of procedure/ provider if they or anyone in their  household develops symptoms or tests positive for COVID-19, has close contact with someone who tests positive for COVID, or has known exposure to any contagious illness. Yes  Call this number the morning of surgery  with any problems that may cancel your surgery. [phone]  Covid-19 Assessment  Have you had a positive COVID-19 test within the previous 90 days? No  COVID Testing Guidance Proceed with the additional questions.   Reviewed with Dr. Hyacinth Meeker okay for surgery center
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
## 2023-09-07 NOTE — Patient Instructions (Addendum)
[email or web address]  Call [phone] for assistance with any of our services Call 911 if you are experiencing a medical emergency   [facility]  [phone]  Address:  [address]  [place], [place] [address]  Hours:  Open 24/7, No appointment required.

## 2023-09-07 NOTE — Telephone Encounter (Signed)
Patient was informed PCP is out of the office and that there are no available appts and to use Mychart to schedule a virtual visit

## 2023-09-09 LAB — SURGICAL PATHOLOGY

## 2023-09-12 ENCOUNTER — Ambulatory Visit: Payer: 59 | Admitting: Neurology

## 2023-09-12 ENCOUNTER — Ambulatory Visit: Payer: 59 | Attending: Cardiology

## 2023-09-12 DIAGNOSIS — G4734 Idiopathic sleep related nonobstructive alveolar hypoventilation: Secondary | ICD-10-CM

## 2023-09-12 DIAGNOSIS — R0683 Snoring: Secondary | ICD-10-CM

## 2023-09-12 DIAGNOSIS — E663 Overweight: Secondary | ICD-10-CM

## 2023-09-12 DIAGNOSIS — I10 Essential (primary) hypertension: Secondary | ICD-10-CM | POA: Diagnosis not present

## 2023-09-12 DIAGNOSIS — R0681 Apnea, not elsewhere classified: Secondary | ICD-10-CM

## 2023-09-12 DIAGNOSIS — G4700 Insomnia, unspecified: Secondary | ICD-10-CM

## 2023-09-12 DIAGNOSIS — Z820 Family history of epilepsy and other diseases of the nervous system: Secondary | ICD-10-CM

## 2023-09-12 DIAGNOSIS — E785 Hyperlipidemia, unspecified: Secondary | ICD-10-CM | POA: Diagnosis not present

## 2023-09-12 DIAGNOSIS — G4733 Obstructive sleep apnea (adult) (pediatric): Secondary | ICD-10-CM

## 2023-09-12 DIAGNOSIS — I5020 Unspecified systolic (congestive) heart failure: Secondary | ICD-10-CM | POA: Diagnosis not present

## 2023-09-12 DIAGNOSIS — Z9189 Other specified personal risk factors, not elsewhere classified: Secondary | ICD-10-CM

## 2023-09-12 DIAGNOSIS — I2510 Atherosclerotic heart disease of native coronary artery without angina pectoris: Secondary | ICD-10-CM | POA: Diagnosis not present

## 2023-09-13 ENCOUNTER — Ambulatory Visit: Payer: 59 | Admitting: Nurse Practitioner

## 2023-09-13 ENCOUNTER — Encounter: Payer: Self-pay | Admitting: Dermatology

## 2023-09-13 ENCOUNTER — Ambulatory Visit: Payer: 59 | Admitting: Dermatology

## 2023-09-13 VITALS — BP 97/63

## 2023-09-13 DIAGNOSIS — B078 Other viral warts: Secondary | ICD-10-CM | POA: Diagnosis not present

## 2023-09-13 DIAGNOSIS — Z85828 Personal history of other malignant neoplasm of skin: Secondary | ICD-10-CM | POA: Diagnosis not present

## 2023-09-13 DIAGNOSIS — L709 Acne, unspecified: Secondary | ICD-10-CM | POA: Diagnosis not present

## 2023-09-13 DIAGNOSIS — L739 Follicular disorder, unspecified: Secondary | ICD-10-CM

## 2023-09-13 DIAGNOSIS — L700 Acne vulgaris: Secondary | ICD-10-CM

## 2023-09-13 LAB — LIPID PANEL
Chol/HDL Ratio: 3.6 {ratio} (ref 0.0–5.0)
Cholesterol, Total: 139 mg/dL (ref 100–199)
HDL: 39 mg/dL — ABNORMAL LOW (ref >39)
LDL Chol Calc (NIH): 83 mg/dL (ref 0–99)
Triglycerides: 87 mg/dL (ref 0–149)
VLDL Cholesterol Cal: 17 mg/dL (ref 5–40)

## 2023-09-13 LAB — HEPATIC FUNCTION PANEL
ALT: 107 [IU]/L — ABNORMAL HIGH (ref 0–44)
AST: 67 [IU]/L — ABNORMAL HIGH (ref 0–40)
Albumin: 4.3 g/dL (ref 4.1–5.1)
Alkaline Phosphatase: 169 [IU]/L — ABNORMAL HIGH (ref 44–121)
Bilirubin Total: 0.5 mg/dL (ref 0.0–1.2)
Bilirubin, Direct: 0.25 mg/dL (ref 0.00–0.40)
Total Protein: 7 g/dL (ref 6.0–8.5)

## 2023-09-13 MED ORDER — IMIQUIMOD 5 % EX CREA: TOPICAL_CREAM | Freq: Every day | CUTANEOUS | 1 refills | Status: DC

## 2023-09-13 NOTE — Patient Instructions (Addendum)
Hello Troy [name],  Thank you for visiting [place] today. We appreciate your commitment to improving your health and effectively managing your dermatological concerns.  Here is a summary of the key instructions from today's consultation:  - Mupirocin Ointment: Continue applying to the surgical site on your back until fully healed, approximately 6-8 weeks. This is to prevent infection and promote healing.   - Moisturization: Avoid letting the area get dry; keep it moisturized.  - Flat Warts Treatment: Continue taking Cimetidine 200 mg daily.  - Imiquimod Cream: Start using nightly for the warts once the surgical sites are sufficiently healed (about 1.5-2 weeks). Apply a thin layer at night.  - Acne Management: Use Clindamycin solution on any acne flare-ups as needed.  - Post-Gym Care: Follow up with washing using benzoyl peroxide after gym sessions to prevent acne.  - Follow-Up: We will schedule a follow-up appointment in 6-8 weeks to monitor progress and possibly continue with cryotherapy for wart removal.  Please ensure to follow these instructions carefully to aid in your recovery and management of your conditions. If you have any questions or concerns before our next meeting, please do not hesitate to contact our office.  Wishing you a smooth recovery and a healthy season ahead.  Warm regards,  Dr. Langston Reusing,  Dermatology     Cryotherapy Aftercare  Wash gently with soap and water everyday.   Apply Vaseline and Band-Aid daily until healed.     Important Information  Due to recent changes in healthcare laws, you may see results of your pathology and/or laboratory studies on MyChart before the doctors have had a chance to review them. We understand that in some cases there may be results that are confusing or concerning to you. Please understand that not all results are received at the same time and often the doctors may need to interpret multiple results in order to provide you  with the best plan of care or course of treatment. Therefore, we ask that you please give [place] 2 business days to thoroughly review all your results before contacting the office for clarification. Should we see a critical lab result, you will be contacted sooner.   If You Need Anything After Your Visit  If you have any questions or concerns for your doctor, please call our main line at [phone] If no one answers, please leave a voicemail as directed and we will return your call as soon as possible. Messages left after 4 pm will be answered the following business day.   You may also send [place] a message via MyChart. We typically respond to MyChart messages within 1-2 business days.  For prescription refills, please ask your pharmacy to contact our office. Our fax number is [phone].  If you have an urgent issue when the clinic is closed that cannot wait until the next business day, you can page your doctor at the number below.    Please note that while we do our best to be available for urgent issues outside of office hours, we are not available 24/7.   If you have an urgent issue and are unable to reach [place], you may choose to seek medical care at your doctor's office, retail clinic, urgent care center, or emergency room.  If you have a medical emergency, please immediately call 911 or go to the emergency department. In the event of inclement weather, please call our main line at [phone] for an update on the status of any delays or closures.  Dermatology  Medication Tips: Please keep the boxes that topical medications come in in order to help keep track of the instructions about where and how to use these. Pharmacies typically print the medication instructions only on the boxes and not directly on the medication tubes.   If your medication is too expensive, please contact our office at [phone] or send [place] a message through MyChart.   We are unable to tell what your co-pay for  medications will be in advance as this is different depending on your insurance coverage. However, we may be able to find a substitute medication at lower cost or fill out paperwork to get insurance to cover a needed medication.   If a prior authorization is required to get your medication covered by your insurance company, please allow [place] 1-2 business days to complete this process.  Drug prices often vary depending on where the prescription is filled and some pharmacies may offer cheaper prices.  The website www.goodrx.com contains coupons for medications through different pharmacies. The prices here do not account for what the cost may be with help from insurance (it may be cheaper with your insurance), but the website can give you the price if you did not use any insurance.  - You can print the associated coupon and take it with your prescription to the pharmacy.  - You may also stop by our office during regular business hours and pick up a GoodRx coupon card.  - If you need your prescription sent electronically to a different pharmacy, notify our office through [facility] or by phone at [phone]

## 2023-09-13 NOTE — Progress Notes (Unsigned)
{Choose 1 Note Type (Telehealth Visit or Telephone Visit):[phone]}  Evaluation Performed:  Preoperative cardiovascular risk assessment _____________   Date:  09/13/2023   Patient ID:  Troy [name], DOB 02-Jul-1976, MRN [id number] Patient Location:  Home Provider location:   Office  Primary Care Provider:  Loyola Mast, MD Primary Cardiologist:  Christell Constant, MD  Chief Complaint / Patient Profile   47 y.o. 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 Hulda Humphrey on 09/15/23 and presents today for telephonic preoperative cardiovascular risk assessment.  History of Present Illness    Troy [name] is a 47 y.o. male who presents via audio/video conferencing for a telehealth visit today.  Pt was last seen in cardiology clinic on 09/05/23 by Malena Peer, RPH.  At that time KAP MILTIMORE was doing well.  The patient is now pending procedure as outlined above. Since his last visit, he *** denies chest pain, shortness of breath, lower extremity edema, fatigue, palpitations, melena, hematuria, hemoptysis, diaphoresis, weakness, presyncope, syncope, orthopnea, and PND.   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   Chronic fatigue    Drug abuse (HCC)    Family history of breast cancer 05/19/2021   Family history of colon cancer in mother    GERD (gastroesophageal reflux disease)    +Barretts   Hematochezia    hemorrhoids   Hepatic steatosis    +hepatomegaly   History of CVA (cerebrovascular accident)    radiation-induced carotid damage/changes   History of hepatitis C    +treated   History of  Hodgkin's lymphoma    age 40->mantle RT + splenectomy   History of splenectomy    Hyperlipidemia    Hypertension    Insomnia    Male hypogonadism    Moderate persistent asthma    Obesity, Class I, BMI 30-34.9    Papillary thyroid carcinoma (HCC) 2010   total thyroidectomy + neck dissection   Postsurgical hypothyroidism    Prediabetes    Pulmonary fibrosis (HCC)    ?rad induced?   Past Surgical History:  Procedure Laterality Date   COLONOSCOPY  07/04/2019   done for hematochezia and FH col ca-mother: Mod int hem w/inflamm.  No adenomatous polyps.   INCISIONAL HERNIA REPAIR  06/2019   RUQ (prev lap append site)   LAPAROSCOPIC APPENDECTOMY N/A 03/25/2017   Procedure: APPENDECTOMY LAPAROSCOPIC; EXTENSIVE LYSIS OF ADHESIONS FROM PRIOR RADIATION AND SURGERY;  Surgeon: Luretha Murphy, MD;  Location: [facility];  Service: General;  Laterality: N/A;   PARATHYROIDECTOMY     SPLENECTOMY     TEE WITHOUT CARDIOVERSION N/A 07/18/2015   Procedure: TRANSESOPHAGEAL ECHOCARDIOGRAM (TEE);  Surgeon: [name], MD;  Location: [facility] ENDOSCOPY;  Service: Cardiovascular;  Laterality: N/A;   THYROIDECTOMY      Allergies  Allergies  Allergen Reactions   Ciprofloxacin Other (See Comments)    Spontaneous rupture of tendon   Earnestine Leys Officinalis] Anaphylaxis and Hives   Fluocinolone Other (See Comments)    "spontaneous rupture of tendons" per pt dr.   Marcelline Deist [Dapagliflozin]     abscess    Prednisone Rash  Home Medications    Prior to Admission medications   Medication Sig Start Date End Date Taking? Authorizing Provider  albuterol (PROVENTIL) (2.5 MG/3ML) 0.083% nebulizer solution Take 3 mLs (2.5 mg total) by nebulization every 4 (four) hours as needed for wheezing or shortness of breath. 05/13/23   Alfonse Spruce, MD  Albuterol-Budesonide 90-80 MCG/ACT AERO Inhale 2 Inhalations into the lungs every 4 (four) hours as needed. Maximum 12 inhalations per day. 03/29/23   Loyola Mast, MD   ammonium lactate (LAC-HYDRIN) 12 % lotion Apply 1 application topically as needed for dry skin.    [provider]  aspirin 325 MG tablet Take 1 tablet (325 mg total) by mouth daily. 07/18/15   Ghimire, Werner Lean, MD  Azelastine HCl 137 MCG/SPRAY SOLN USE 1 SPRAY(S) IN EACH NOSTRIL ONCE DAILY AS DIRECTED 06/09/23   Alfonse Spruce, MD  baclofen (LIORESAL) 10 MG tablet Take 1 tablet by mouth three times daily as needed 05/10/22   Loyola Mast, MD  bisoprolol (ZEBETA) 10 MG tablet Take 0.5 tablets (5 mg total) by mouth at bedtime. Patient taking differently: Take 5 mg by mouth at bedtime. 5 mg 07/22/23   Chandrasekhar, Mahesh A, MD  budesonide (PULMICORT) 0.5 MG/2ML nebulizer solution Mix with Xopenex nebulizer solution every 6 hours as needed. 04/08/23   Alfonse Spruce, MD  cimetidine (TAGAMET) 200 MG tablet Take 1 tablet daily 05/24/23   Terri Piedra, DO  clindamycin (CLEOCIN T) 1 % lotion Apply topically as needed (rash). 05/25/22   [provider]  diphenhydrAMINE HCl (BENADRYL PO) Take by mouth.    [provider]  doxycycline (VIBRAMYCIN) 100 MG capsule Take 1 with food once a day for 1 month Patient not taking: Reported on 09/05/2023 07/20/23   Terri Piedra, DO  EPINEPHrine 0.3 mg/0.3 mL IJ SOAJ injection Inject 0.3 mg into the muscle as needed for anaphylaxis. 04/07/23   Alfonse Spruce, MD  esomeprazole (NEXIUM) 40 MG capsule TAKE 1 CAPSULE BY MOUTH TWICE DAILY BEFORE A MEAL 01/17/23   Loyola Mast, MD  Evolocumab (REPATHA SURECLICK) 140 MG/ML SOAJ Inject 140 mg into the skin every 14 (fourteen) days. 05/20/23   Christell Constant, MD  fluticasone Aleda Grana) 50 MCG/ACT nasal spray Use 1 spray(s) in each nostril once daily 03/08/22   Loyola Mast, MD  Fluticasone-Umeclidin-Vilant (TRELEGY ELLIPTA) 200-62.5-25 MCG/ACT AEPB INHALE 1 PUFF INTO LUNGS ONCE DAILY 06/07/23   Alfonse Spruce, MD  furosemide (LASIX) 20 MG tablet Take 1  tablet (20 mg total) by mouth 3 (three) times a week. 12/31/22   Christell Constant, MD  gabapentin (NEURONTIN) 300 MG capsule TAKE 2 Casp am, 2 caps noon, 4 caps bedtime 07/25/23   Loyola Mast, MD  hydrOXYzine (VISTARIL) 50 MG capsule Take 1 capsule (50 mg total) by mouth at bedtime. Patient taking differently: Take 50 mg by mouth as needed. 02/02/21   Loyola Mast, MD  levalbuterol [facility] HFA) 45 MCG/ACT inhaler Inhale 2 puffs into the lungs every 4 (four) hours as needed for wheezing. 05/31/23   Alfonse Spruce, MD  levalbuterol Pauline Aus) 1.25 MG/3ML nebulizer solution Take 1.25 mg by nebulization every 4 (four) hours as needed for wheezing. 04/08/23   Alfonse Spruce, MD  levothyroxine (SYNTHROID) 300 MCG tablet Take 300 mcg by mouth daily before breakfast.    [provider]  LUER LOCK SAFETY SYRINGES 22G X 1-1/2" 3 ML MISC USE FOR SELF ADMINISTER OF  TESTOSTERONE 2CC ONCE EVERY 2 WEEKS 09/30/20   [provider]  Melatonin 10 MG TABS Take by mouth at bedtime.    [provider]  Multiple Vitamin (MULTI-VITAMIN) tablet Take 1 tablet by mouth daily. 07/24/09   [provider]  mupirocin ointment (BACTROBAN) 2 % Apply 1 Application topically daily. Covered under a bandage 09/06/23   Gwenith Daily, MD  rosuvastatin (CRESTOR) 40 MG tablet Take 1 tablet by mouth once daily 03/20/23   Loyola Mast, MD  sacubitril-valsartan (ENTRESTO) 97-103 MG Take 1 tablet by mouth 2 (two) times daily. 08/23/23   Christell Constant, MD  Suvorexant (BELSOMRA) 10 MG TABS TAKE 1 TABLET BY MOUTH AT BEDTIME 08/22/23   Loyola Mast, MD  tamsulosin [facility]) 0.4 MG CAPS capsule Take 1 capsule by mouth once daily 09/18/22   Loyola Mast, MD  testosterone cypionate (DEPOTESTOSTERONE CYPIONATE) 200 MG/ML injection Inject 0.6 mLs (120 mg total) into the muscle once a week. 06/02/22   Loyola Mast, MD  traZODone (DESYREL) 150 MG tablet TAKE 2 TABLETS BY MOUTH  AT BEDTIME 03/21/23   Loyola Mast, MD  VENTOLIN HFA 108 [phone] Base) MCG/ACT inhaler INHALE 2 PUFFS BY MOUTH EVERY 6 HOURS AS NEEDED FOR SHORTNESS OF BREATH 04/08/23   Alfonse Spruce, MD    Physical Exam    Vital Signs:  RADU MISIASZEK does not have vital signs available for review today.***  Given telephonic nature of communication, physical exam is limited. AAOx3. NAD. Normal affect.  Speech and respirations are unlabored.  Accessory Clinical Findings    None  Assessment & Plan    1.  Preoperative Cardiovascular Risk Assessment:  The patient was advised that if he develops new symptoms prior to surgery to contact our office to arrange for a follow-up visit, and he verbalized understanding.  Aspirin is managed by neurology due to history of stroke.   A copy of this note will be routed to requesting surgeon.  Time:   Today, I have spent *** minutes with the patient with telehealth technology discussing medical history, symptoms, and management plan.     Levi Aland, NP  09/13/2023, 5:56 AM

## 2023-09-13 NOTE — Progress Notes (Signed)
   Follow-Up Visit   Subjective  Troy [name] is a 47 y.o. male who presents for the following: Folliculitis/Acne follow up of arms and shoulders. He took doxycycline x 1 month and it is much better - mostly clear.  Flat warts of hands follow up - He is taking cimetidine daily but warts are not gone.    The following portions of the chart were reviewed this encounter and updated as appropriate: medications, allergies, medical history  Review of Systems:  No other skin or systemic complaints except as noted in HPI or Assessment and Plan.  Objective  Well appearing patient in no apparent distress; mood and affect are within normal limits.   A focused examination was performed of the following areas: arms, hands  Relevant exam findings are noted in the Assessment and Plan.  Hands, arms (20) Flat verrucous papules    Assessment & Plan   HISTORY OF BASAL CELL CARCINOMA OF POST NECK - S/P Mohs - healing by secondary intention. No evidence of recurrence today. Keep follow up appointment with Dr Caralyn Guile 10/07/2023 - Recommend regular full body skin exams - Recommend daily broad spectrum sunscreen SPF 30+ to sun-exposed areas, reapply every 2 hours as needed.  - Call if any new or changing lesions are noted between office visits  FOLLICULITIS/ACNE Exam: Clear today  Treatment Plan: May restart Doxycycline with flares. Continue Clindamycin lotion    Other viral warts (20) Hands, arms  Continue Cimetidine 200 mg daily.  Start Imiquimod 5% cream at bedtime   Destruction of lesion - Hands, arms (20) Complexity: simple   Destruction method: cryotherapy   Informed consent: discussed and consent obtained   Timeout:  patient name, date of birth, surgical site, and procedure verified Lesion destroyed using liquid nitrogen: Yes   Region frozen until ice ball extended beyond lesion: Yes   Outcome: patient tolerated procedure well with no complications   Post-procedure details:  wound care instructions given      Return for 6-8 weeks , Wart Follow up.  I, Joanie Coddington, CMA, am acting as scribe for [employer], DO .   Documentation: I have reviewed the above documentation for accuracy and completeness, and I agree with the above.  Langston Reusing, DO

## 2023-09-14 ENCOUNTER — Encounter (HOSPITAL_BASED_OUTPATIENT_CLINIC_OR_DEPARTMENT_OTHER)
Admission: RE | Admit: 2023-09-14 | Discharge: 2023-09-14 | Disposition: A | Discharge status: Home / Self Care | Payer: 59 | Source: Ambulatory Visit | Attending: Orthopedic Surgery | Admitting: Orthopedic Surgery

## 2023-09-14 ENCOUNTER — Telehealth: Payer: Self-pay | Admitting: Pharmacist

## 2023-09-14 ENCOUNTER — Institutional Professional Consult (permissible substitution): Payer: 59 | Admitting: Neurology

## 2023-09-14 DIAGNOSIS — Z01818 Encounter for other preprocedural examination: Secondary | ICD-10-CM | POA: Diagnosis not present

## 2023-09-14 LAB — BASIC METABOLIC PANEL
Anion gap: 7 (ref 5–15)
BUN: 20 mg/dL (ref 6–20)
CO2: 24 mmol/L (ref 22–32)
Calcium: 8.6 mg/dL — ABNORMAL LOW (ref 8.9–10.3)
Chloride: 104 mmol/L (ref 98–111)
Creatinine, Ser: 1.3 mg/dL — ABNORMAL HIGH (ref 0.61–1.24)
GFR, Estimated: >60 mL/min (ref >60)
Glucose, Bld: 100 mg/dL — ABNORMAL HIGH (ref 70–99)
Potassium: 5.3 mmol/L — ABNORMAL HIGH (ref 3.5–5.1)
Sodium: 135 mmol/L (ref 135–145)

## 2023-09-14 NOTE — Telephone Encounter (Signed)
LDL-C is better. LFT improved but still elevated. BMP wasn't ordered. I called [facility] and added it on.  Called pt to review labs. He does admit to drinking alcohol. States his life has been stressful, girlfriend was admitted today to rehab (fentanyl). He will stop drinking.  Repeat LFT and LDL in 1 month at pharmd apt. He is getting BMP today as part of pre-surgery labs  BP has been 97/62- 115/75 No changes for now. Follow up BMP tomorrow.

## 2023-09-14 NOTE — Progress Notes (Signed)

## 2023-09-15 ENCOUNTER — Encounter: Payer: Self-pay | Admitting: Pharmacist

## 2023-09-15 ENCOUNTER — Ambulatory Visit (HOSPITAL_COMMUNITY): Admit: 2023-09-15 | Payer: 59 | Admitting: Orthopedic Surgery

## 2023-09-15 SURGERY — RECONSTRUCTION, KNEE, ACL
Anesthesia: Choice | Site: Knee | Laterality: Left

## 2023-09-15 MED ORDER — ENTRESTO 49-51 MG PO TABS: 1.0000 | ORAL_TABLET | Freq: Two times a day (BID) | ORAL | 3 refills | Status: DC

## 2023-09-15 NOTE — Progress Notes (Signed)
K+ 5.3, will repeat BMET day of surgery per Dr. Miguel Rota.

## 2023-09-16 ENCOUNTER — Ambulatory Visit (HOSPITAL_BASED_OUTPATIENT_CLINIC_OR_DEPARTMENT_OTHER)
Admission: RE | Admit: 2023-09-16 | Discharge: 2023-09-16 | Disposition: A | Discharge status: Home / Self Care | Payer: 59 | Source: Ambulatory Visit | Attending: Orthopedic Surgery | Admitting: Orthopedic Surgery

## 2023-09-16 ENCOUNTER — Encounter (HOSPITAL_BASED_OUTPATIENT_CLINIC_OR_DEPARTMENT_OTHER)
Admission: RE | Disposition: A | Discharge status: Home / Self Care | Payer: Self-pay | Source: Ambulatory Visit | Attending: Orthopedic Surgery

## 2023-09-16 ENCOUNTER — Other Ambulatory Visit: Payer: Self-pay

## 2023-09-16 ENCOUNTER — Ambulatory Visit (HOSPITAL_BASED_OUTPATIENT_CLINIC_OR_DEPARTMENT_OTHER): Payer: 59 | Admitting: Anesthesiology

## 2023-09-16 ENCOUNTER — Encounter (HOSPITAL_BASED_OUTPATIENT_CLINIC_OR_DEPARTMENT_OTHER): Payer: Self-pay | Admitting: Orthopedic Surgery

## 2023-09-16 ENCOUNTER — Encounter: Payer: Self-pay | Admitting: *Deleted

## 2023-09-16 DIAGNOSIS — X58XXXA Exposure to other specified factors, initial encounter: Secondary | ICD-10-CM | POA: Diagnosis not present

## 2023-09-16 DIAGNOSIS — G8918 Other acute postprocedural pain: Secondary | ICD-10-CM | POA: Diagnosis not present

## 2023-09-16 DIAGNOSIS — Z87891 Personal history of nicotine dependence: Secondary | ICD-10-CM | POA: Diagnosis not present

## 2023-09-16 DIAGNOSIS — S83282A Other tear of lateral meniscus, current injury, left knee, initial encounter: Secondary | ICD-10-CM | POA: Diagnosis not present

## 2023-09-16 DIAGNOSIS — Z85850 Personal history of malignant neoplasm of thyroid: Secondary | ICD-10-CM | POA: Insufficient documentation

## 2023-09-16 DIAGNOSIS — J4540 Moderate persistent asthma, uncomplicated: Secondary | ICD-10-CM | POA: Insufficient documentation

## 2023-09-16 DIAGNOSIS — M65962 Unspecified synovitis and tenosynovitis, left lower leg: Secondary | ICD-10-CM | POA: Insufficient documentation

## 2023-09-16 DIAGNOSIS — E890 Postprocedural hypothyroidism: Secondary | ICD-10-CM | POA: Diagnosis not present

## 2023-09-16 DIAGNOSIS — J8410 Pulmonary fibrosis, unspecified: Secondary | ICD-10-CM | POA: Diagnosis not present

## 2023-09-16 DIAGNOSIS — F319 Bipolar disorder, unspecified: Secondary | ICD-10-CM | POA: Insufficient documentation

## 2023-09-16 DIAGNOSIS — K219 Gastro-esophageal reflux disease without esophagitis: Secondary | ICD-10-CM | POA: Insufficient documentation

## 2023-09-16 DIAGNOSIS — Z823 Family history of stroke: Secondary | ICD-10-CM | POA: Insufficient documentation

## 2023-09-16 DIAGNOSIS — M1990 Unspecified osteoarthritis, unspecified site: Secondary | ICD-10-CM | POA: Insufficient documentation

## 2023-09-16 DIAGNOSIS — Z01818 Encounter for other preprocedural examination: Secondary | ICD-10-CM

## 2023-09-16 DIAGNOSIS — S83512A Sprain of anterior cruciate ligament of left knee, initial encounter: Secondary | ICD-10-CM | POA: Insufficient documentation

## 2023-09-16 DIAGNOSIS — I110 Hypertensive heart disease with heart failure: Secondary | ICD-10-CM | POA: Insufficient documentation

## 2023-09-16 DIAGNOSIS — I509 Heart failure, unspecified: Secondary | ICD-10-CM | POA: Insufficient documentation

## 2023-09-16 DIAGNOSIS — Z8673 Personal history of transient ischemic attack (TIA), and cerebral infarction without residual deficits: Secondary | ICD-10-CM | POA: Diagnosis not present

## 2023-09-16 DIAGNOSIS — N6091 Unspecified benign mammary dysplasia of right breast: Secondary | ICD-10-CM | POA: Diagnosis not present

## 2023-09-16 DIAGNOSIS — I5020 Unspecified systolic (congestive) heart failure: Secondary | ICD-10-CM | POA: Diagnosis not present

## 2023-09-16 HISTORY — PX: MENISCUS REPAIR: SHX5179

## 2023-09-16 LAB — BASIC METABOLIC PANEL
Anion gap: 9 (ref 5–15)
BUN: 23 mg/dL — ABNORMAL HIGH (ref 6–20)
CO2: 20 mmol/L — ABNORMAL LOW (ref 22–32)
Calcium: 8.9 mg/dL (ref 8.9–10.3)
Chloride: 104 mmol/L (ref 98–111)
Creatinine, Ser: 1.04 mg/dL (ref 0.61–1.24)
GFR, Estimated: >60 mL/min (ref >60)
Glucose, Bld: 159 mg/dL — ABNORMAL HIGH (ref 70–99)
Potassium: 4.1 mmol/L (ref 3.5–5.1)
Sodium: 133 mmol/L — ABNORMAL LOW (ref 135–145)

## 2023-09-16 SURGERY — KNEE ARTHROSCOPY WITH ANTERIOR CRUCIATE LIGAMENT (ACL) RECONSTRUCTION WITH HAMSTRING GRAFT
Anesthesia: General | Site: Knee | Laterality: Left

## 2023-09-16 MED ORDER — FENTANYL CITRATE (PF) 100 MCG/2ML IJ SOLN
25.0000 ug | INTRAMUSCULAR | Status: DC | PRN
  Administered 2023-09-16 (×3): 50 ug via INTRAVENOUS

## 2023-09-16 MED ORDER — HYDROMORPHONE HCL 1 MG/ML IJ SOLN
INTRAMUSCULAR | Status: AC
  Filled 2023-09-16: qty 0.5

## 2023-09-16 MED ORDER — MIDAZOLAM HCL 2 MG/2ML IJ SOLN
2.0000 mg | Freq: Once | INTRAMUSCULAR | Status: AC
  Administered 2023-09-16: 2 mg via INTRAVENOUS

## 2023-09-16 MED ORDER — MIDAZOLAM HCL 2 MG/2ML IJ SOLN
INTRAMUSCULAR | Status: AC
  Filled 2023-09-16: qty 2

## 2023-09-16 MED ORDER — BUPIVACAINE-EPINEPHRINE (PF) 0.5% -1:200000 IJ SOLN
INTRAMUSCULAR | Status: DC | PRN
  Administered 2023-09-16: 30 mL

## 2023-09-16 MED ORDER — ROCURONIUM BROMIDE 100 MG/10ML IV SOLN
INTRAVENOUS | Status: DC | PRN
  Administered 2023-09-16: 50 mg via INTRAVENOUS

## 2023-09-16 MED ORDER — DEXAMETHASONE SODIUM PHOSPHATE 10 MG/ML IJ SOLN
INTRAMUSCULAR | Status: AC
  Filled 2023-09-16: qty 1

## 2023-09-16 MED ORDER — ONDANSETRON HCL 4 MG/2ML IJ SOLN
INTRAMUSCULAR | Status: DC | PRN
  Administered 2023-09-16: 4 mg via INTRAVENOUS

## 2023-09-16 MED ORDER — SODIUM CHLORIDE 0.9 % IV SOLN: INTRAVENOUS | Status: DC | PRN

## 2023-09-16 MED ORDER — PROPOFOL 10 MG/ML IV BOLUS
INTRAVENOUS | Status: AC
  Filled 2023-09-16: qty 20

## 2023-09-16 MED ORDER — PHENYLEPHRINE HCL (PRESSORS) 10 MG/ML IV SOLN
INTRAVENOUS | Status: AC
  Filled 2023-09-16: qty 1

## 2023-09-16 MED ORDER — SODIUM CHLORIDE 0.9 % IR SOLN
Status: DC | PRN
  Administered 2023-09-16: 750 mL

## 2023-09-16 MED ORDER — OXYCODONE HCL 5 MG PO TABS
5.0000 mg | ORAL_TABLET | Freq: Once | ORAL | Status: AC | PRN
  Administered 2023-09-16: 5 mg via ORAL

## 2023-09-16 MED ORDER — ACETAMINOPHEN 500 MG PO TABS
ORAL_TABLET | ORAL | Status: AC
  Filled 2023-09-16: qty 2

## 2023-09-16 MED ORDER — LIDOCAINE 2% (20 MG/ML) 5 ML SYRINGE
INTRAMUSCULAR | Status: AC
  Filled 2023-09-16: qty 5

## 2023-09-16 MED ORDER — OXYCODONE HCL 5 MG/5ML PO SOLN: 5.0000 mg | Freq: Once | ORAL | Status: AC | PRN

## 2023-09-16 MED ORDER — HYDROMORPHONE HCL 1 MG/ML IJ SOLN
0.5000 mg | INTRAMUSCULAR | Status: AC | PRN
  Administered 2023-09-16 (×4): 0.5 mg via INTRAVENOUS

## 2023-09-16 MED ORDER — LACTATED RINGERS IV SOLN: INTRAVENOUS | Status: DC

## 2023-09-16 MED ORDER — FENTANYL CITRATE (PF) 100 MCG/2ML IJ SOLN
INTRAMUSCULAR | Status: DC | PRN
  Administered 2023-09-16 (×2): 50 ug via INTRAVENOUS

## 2023-09-16 MED ORDER — FENTANYL CITRATE (PF) 100 MCG/2ML IJ SOLN
INTRAMUSCULAR | Status: AC
  Filled 2023-09-16: qty 2

## 2023-09-16 MED ORDER — AMISULPRIDE (ANTIEMETIC) 5 MG/2ML IV SOLN: 10.0000 mg | Freq: Once | INTRAVENOUS | Status: DC | PRN

## 2023-09-16 MED ORDER — CEFAZOLIN SODIUM-DEXTROSE 2-4 GM/100ML-% IV SOLN
2.0000 g | INTRAVENOUS | Status: AC
  Administered 2023-09-16: 2 g via INTRAVENOUS

## 2023-09-16 MED ORDER — EPINEPHRINE PF 1 MG/ML IJ SOLN
INTRAMUSCULAR | Status: AC
  Filled 2023-09-16: qty 1

## 2023-09-16 MED ORDER — ONDANSETRON HCL 4 MG PO TABS: 4.0000 mg | ORAL_TABLET | Freq: Three times a day (TID) | ORAL | 0 refills | Status: DC | PRN

## 2023-09-16 MED ORDER — ONDANSETRON HCL 4 MG/2ML IJ SOLN
INTRAMUSCULAR | Status: AC
  Filled 2023-09-16: qty 2

## 2023-09-16 MED ORDER — FENTANYL CITRATE (PF) 100 MCG/2ML IJ SOLN
100.0000 ug | Freq: Once | INTRAMUSCULAR | Status: AC
  Administered 2023-09-16: 100 ug via INTRAVENOUS

## 2023-09-16 MED ORDER — HYDROMORPHONE HCL 1 MG/ML IJ SOLN
0.5000 mg | INTRAMUSCULAR | Status: AC
  Administered 2023-09-16 (×2): 0.5 mg via INTRAVENOUS

## 2023-09-16 MED ORDER — PHENYLEPHRINE HCL (PRESSORS) 10 MG/ML IV SOLN
INTRAVENOUS | Status: DC | PRN
  Administered 2023-09-16 (×4): 160 ug via INTRAVENOUS

## 2023-09-16 MED ORDER — CEFAZOLIN SODIUM-DEXTROSE 2-4 GM/100ML-% IV SOLN
INTRAVENOUS | Status: AC
  Filled 2023-09-16: qty 100

## 2023-09-16 MED ORDER — PHENYLEPHRINE HCL-NACL 20-0.9 MG/250ML-% IV SOLN
INTRAVENOUS | Status: DC | PRN
  Administered 2023-09-16: 50 ug/min via INTRAVENOUS

## 2023-09-16 MED ORDER — LIDOCAINE HCL (CARDIAC) PF 100 MG/5ML IV SOSY
PREFILLED_SYRINGE | INTRAVENOUS | Status: DC | PRN
  Administered 2023-09-16: 40 mg via INTRAVENOUS

## 2023-09-16 MED ORDER — PROPOFOL 10 MG/ML IV BOLUS
INTRAVENOUS | Status: DC | PRN
  Administered 2023-09-16: 50 mg via INTRAVENOUS
  Administered 2023-09-16: 250 mg via INTRAVENOUS
  Administered 2023-09-16: 20 mg via INTRAVENOUS

## 2023-09-16 MED ORDER — OXYCODONE HCL 5 MG PO TABS
5.0000 mg | ORAL_TABLET | ORAL | 0 refills | Status: DC | PRN
Start: 2023-09-16 — End: 2023-10-18

## 2023-09-16 MED ORDER — OXYCODONE HCL 5 MG PO TABS
ORAL_TABLET | ORAL | Status: AC
  Filled 2023-09-16: qty 1

## 2023-09-16 MED ORDER — SODIUM CHLORIDE 0.9 % IR SOLN
Status: DC | PRN
  Administered 2023-09-16: 3000 mL

## 2023-09-16 MED ORDER — DEXAMETHASONE SODIUM PHOSPHATE 10 MG/ML IJ SOLN
INTRAMUSCULAR | Status: DC | PRN
  Administered 2023-09-16: 5 mg via INTRAVENOUS

## 2023-09-16 MED ORDER — SUGAMMADEX SODIUM 200 MG/2ML IV SOLN
INTRAVENOUS | Status: DC | PRN
  Administered 2023-09-16: 200 mg via INTRAVENOUS

## 2023-09-16 MED ORDER — ACETAMINOPHEN 500 MG PO TABS
1000.0000 mg | ORAL_TABLET | Freq: Once | ORAL | Status: AC
Start: 2023-09-16 — End: 2023-09-16
  Administered 2023-09-16: 1000 mg via ORAL

## 2023-09-16 MED ORDER — SODIUM CHLORIDE 0.9 % IV SOLN: 12.5000 mg | INTRAVENOUS | Status: DC | PRN

## 2023-09-16 SURGICAL SUPPLY — 75 items
ANCHOR BUTTON TIGHTROPE 14 (Anchor) IMPLANT
BANDAGE ESMARK 6X9 LF (GAUZE/BANDAGES/DRESSINGS) IMPLANT
BLADE SHAVER TORPEDO 4X13 (MISCELLANEOUS) ×1 IMPLANT
BLADE SURG 15 STRL LF DISP TIS (BLADE) ×2 IMPLANT
BNDG ELASTIC 6INX 5YD STR LF (GAUZE/BANDAGES/DRESSINGS) ×1 IMPLANT
BNDG ESMARK 6X9 LF (GAUZE/BANDAGES/DRESSINGS)
BURR OVAL 8 FLU 4.0X13 (MISCELLANEOUS) ×1 IMPLANT
BURR OVAL 8 FLU 5.0X13 (MISCELLANEOUS) IMPLANT
CLSR STERI-STRIP ANTIMIC 1/2X4 (GAUZE/BANDAGES/DRESSINGS) ×1 IMPLANT
COOLER ICEMAN CLASSIC (MISCELLANEOUS) ×1 IMPLANT
COVER BACK TABLE 60X90IN (DRAPES) ×1 IMPLANT
CUFF TRNQT CYL 34X4.125X (TOURNIQUET CUFF) ×1 IMPLANT
CUTTER BONE 4.0MM X 13CM (MISCELLANEOUS) ×1 IMPLANT
CUTTER TENSIONER SUT 2-0 0 FBW (INSTRUMENTS) IMPLANT
DRAPE INCISE IOBAN 66X45 STRL (DRAPES) IMPLANT
DRAPE U-SHAPE 47X51 STRL (DRAPES) ×1 IMPLANT
DRAPE-T ARTHROSCOPY W/POUCH (DRAPES) ×1 IMPLANT
DRILL FLIPCUTTER III 6-12 (ORTHOPEDIC DISPOSABLE SUPPLIES) IMPLANT
DURAPREP 26ML APPLICATOR (WOUND CARE) ×1 IMPLANT
ELECT REM PT RETURN 9FT ADLT (ELECTROSURGICAL) ×1
ELECTRODE REM PT RTRN 9FT ADLT (ELECTROSURGICAL) ×1 IMPLANT
FIBERSTICK 2 (SUTURE) IMPLANT
FLIPCUTTER III 6-12 AR-1204FF (ORTHOPEDIC DISPOSABLE SUPPLIES) ×1
GAUZE PAD ABD 8X10 STRL (GAUZE/BANDAGES/DRESSINGS) ×1 IMPLANT
GAUZE SPONGE 4X4 12PLY STRL (GAUZE/BANDAGES/DRESSINGS) ×1 IMPLANT
GAUZE XEROFORM 1X8 LF (GAUZE/BANDAGES/DRESSINGS) ×1 IMPLANT
GEL BONE GRAFT DBM ALLOSYNC 5 (Bone Implant) IMPLANT
GLOVE BIO SURGEON STRL SZ7.5 (GLOVE) ×2 IMPLANT
GLOVE BIOGEL PI IND STRL 8 (GLOVE) ×2 IMPLANT
GOWN STRL REUS W/ TWL LRG LVL3 (GOWN DISPOSABLE) ×1 IMPLANT
GOWN STRL REUS W/ TWL XL LVL3 (GOWN DISPOSABLE) ×1 IMPLANT
GOWN STRL REUS W/TWL XL LVL3 (GOWN DISPOSABLE) ×2 IMPLANT
GRAFT TISS 60-80 FRZN TENDON (Tissue) IMPLANT
IMP SYS 2ND FIX PEEK 4.75X19.1 (Miscellaneous) ×1 IMPLANT
IMPL SYS 2ND FX PEEK 4.75X19.1 (Miscellaneous) IMPLANT
KIT BIOCARTILAGE LG JOINT MIX (KITS) IMPLANT
KIT SUTLOC MENISCAL ROOT REP (Anchor) IMPLANT
KNIFE GRAFT ACL 10MM 5952 (MISCELLANEOUS) IMPLANT
KNIFE GRAFT ACL 9MM (MISCELLANEOUS) IMPLANT
MANIFOLD NEPTUNE II (INSTRUMENTS) ×1 IMPLANT
NDL SUT 2-0 SCORPION KNEE (NEEDLE) IMPLANT
NEEDLE SUT 2-0 SCORPION KNEE (NEEDLE)
PACK ARTHROSCOPY DSU (CUSTOM PROCEDURE TRAY) ×1 IMPLANT
PACK BASIN DAY SURGERY FS (CUSTOM PROCEDURE TRAY) ×1 IMPLANT
PAD COLD SHLDR WRAP-ON (PAD) ×1 IMPLANT
PENCIL SMOKE EVACUATOR (MISCELLANEOUS) IMPLANT
SHEET MEDIUM DRAPE 40X70 STRL (DRAPES) ×1 IMPLANT
SLEEVE SCD COMPRESS KNEE MED (STOCKING) ×1 IMPLANT
SPONGE T-LAP 4X18 ~~LOC~~+RFID (SPONGE) ×1 IMPLANT
SUCTION TUBE FRAZIER 10FR DISP (SUCTIONS) ×1 IMPLANT
SUT 2 FIBERLOOP 20 STRT BLUE (SUTURE)
SUT ETHILON 4 0 PS 2 18 (SUTURE) ×1 IMPLANT
SUT FIBERWIRE #2 38 REV NDL BL (SUTURE)
SUT FIBERWIRE #2 38 T-5 BLUE (SUTURE)
SUT MNCRL AB 3-0 PS2 18 (SUTURE) ×1 IMPLANT
SUT MON AB 2-0 CT1 36 (SUTURE) ×1 IMPLANT
SUT PDS AB 0 CT 36 (SUTURE) IMPLANT
SUT VIC AB 0 CT1 27XBRD ANBCTR (SUTURE) IMPLANT
SUT VIC AB 1 CT1 27XBRD ANBCTR (SUTURE) IMPLANT
SUT VIC AB 2-0 CT1 TAPERPNT 27 (SUTURE) ×1 IMPLANT
SUT VICRYL 0 UR6 27IN ABS (SUTURE) IMPLANT
SUTURE 2 FIBERLOOP 20 STRT BLU (SUTURE) IMPLANT
SUTURE FIBERWR #2 38 T-5 BLUE (SUTURE) IMPLANT
SUTURE FIBERWR#2 38 REV NDL BL (SUTURE) IMPLANT
SUTURE TAPE TIGERLINK 1.3MM BL (SUTURE) IMPLANT
SUTURETAPE TIGERLINK 1.3MM BL (SUTURE)
SYS ALLOGRAFT GRAFTLINK CP2 (Anchor) ×1 IMPLANT
SYSTEM ALLOGRAFT GRAFTLINK CP2 (Anchor) IMPLANT
TAPE LABRALWHITE 1.5X36 (TAPE) IMPLANT
TISSUE GRAFTLINK FGL (Tissue) ×1 IMPLANT
TOWEL GREEN STERILE FF (TOWEL DISPOSABLE) ×2 IMPLANT
TUBE CONNECTING 20X1/4 (TUBING) IMPLANT
TUBE SUCTION HIGH CAP CLEAR NV (SUCTIONS) ×1 IMPLANT
TUBING ARTHROSCOPY IRRIG 16FT (MISCELLANEOUS) ×1 IMPLANT
WAND ABLATOR APOLLO I90 (BUR) IMPLANT

## 2023-09-16 NOTE — Anesthesia Procedure Notes (Signed)
Procedure Name: LMA Insertion Date/Time: 09/16/2023 8:53 AM  Performed by: Thornell Mule, CRNAPre-anesthesia Checklist: Patient identified, Emergency Drugs available, Suction available and Patient being monitored Patient Re-evaluated:Patient Re-evaluated prior to induction Oxygen Delivery Method: Circle system utilized Preoxygenation: Pre-oxygenation with 100% oxygen Induction Type: IV induction LMA: LMA inserted LMA Size: 4.0 Number of attempts: 1 Placement Confirmation: positive ETCO2 Tube secured with: Tape

## 2023-09-16 NOTE — Progress Notes (Signed)
Spoke with pt and informed him that we will leave his iceman at the front desk until we close at 5pm. After 5pm we will leave it with [facility] staff and he will need to call [facility] at [phone] to pick it up. Pt stated his understanding.

## 2023-09-16 NOTE — Anesthesia Preprocedure Evaluation (Addendum)
Anesthesia Evaluation  Patient identified by MRN, date of birth, ID band Patient awake    Reviewed: Allergy & Precautions, NPO status , Patient's Chart, lab work & pertinent test results, reviewed documented beta blocker date and time   History of Anesthesia Complications Negative for: history of anesthetic complications  Airway Mallampati: II  TM Distance: >3 FB Neck ROM: Full    Dental  (+) Dental Advisory Given, Caps   Pulmonary asthma , former smoker  Pulmonary fibrosis    Pulmonary exam normal        Cardiovascular hypertension, Pt. on medications and Pt. on home beta blockers +CHF  Normal cardiovascular exam   '24 TTE - EF 45 %. Global hypokinesis. The left ventricular internal cavity size was mildly dilated. There is mild left ventricular hypertrophy. Grade I diastolic dysfunction (impaired relaxation). Aortic valve regurgitation is mild.     Neuro/Psych  PSYCHIATRIC DISORDERS   Bipolar Disorder   CVA, No Residual Symptoms    GI/Hepatic ,GERD  Controlled,,(+)     substance abuse (former IVDU)  marijuana use, Hepatitis -, C  Endo/Other  Hypothyroidism   Hx thyroid cancer s/p thyroidectomy   Renal/GU Renal InsufficiencyRenal disease     Musculoskeletal  (+) Arthritis ,    Abdominal   Peds  Hematology  Hodgkin's lymphoma    Anesthesia Other Findings   Reproductive/Obstetrics                             Anesthesia Physical Anesthesia Plan  ASA: 3  Anesthesia Plan: General   Post-op Pain Management: Tylenol PO (pre-op)* and Regional block*   Induction: Intravenous  PONV Risk Score and Plan: 2 and Treatment may vary due to age or medical condition, Ondansetron, Dexamethasone and Midazolam  Airway Management Planned: LMA  Additional Equipment: None  Intra-op Plan:   Post-operative Plan: Extubation in OR  Informed Consent: I have reviewed the patients History and  Physical, chart, labs and discussed the procedure including the risks, benefits and alternatives for the proposed anesthesia with the patient or authorized representative who has indicated his/her understanding and acceptance.     Dental advisory given  Plan Discussed with: CRNA and Anesthesiologist  Anesthesia Plan Comments:         Anesthesia Quick Evaluation

## 2023-09-16 NOTE — Op Note (Signed)
Surgery Date: 09/16/2023    Surgeon(s): Yolonda Kida, MD   ASSIST: Dion Saucier, PA-C  Assistant attestation: PA Mcclung present for the entire procedure.   Implants:  Arthrex all inside cortical buttons on femur and tibia 4.75 PEEK swivel lock x 1. Suture lock x 1 for lateral meniscus root repair. Arthrex graft link allograft for ACL reconstruction.   ANESTHESIA:  general, and adductor block   IV FLUIDS AND URINE: See anesthesia.   TOURNIQUET: 72 minutes at 275 mmHg   DRAINS: none   COMPLICATIONS: None.     ESTIMATED BLOOD LOSS: minimal   PREOPERATIVE DIAGNOSES:  1.  Left knee lateral meniscus root posterior horn meniscus tear 2.  Left knee complete ACL rupture   POSTOPERATIVE DIAGNOSES:  same   PROCEDURES PERFORMED:  1.  Left knee arthroscopy anterior cruciate ligament reconstruction with hamstring allograft 2.  Left knee lateral meniscus posterior horn root repair   DESCRIPTION OF PROCEDURE:  Troy [name] is a very active 47 year old male with left knee Lateral meniscus tear, Complete ACL rupture, partial MCL tear, possible medial meniscus tear.  They sustained these injuries about 2 months prior to coming to the operating room today.  After a short period of prehabilitation to allow for return of ROM and quadriceps strenght, we discussed proceeding with arthroscopically assisted hamstring autograft ACL reconstruction and meniscus repair versus meniscectomy.  We reviewed the risks benefits and indications of this procedure including but not limited to bleeding, infection, damage to neurovascular structures, need for future surgery, developed an of arthrosis, rupture of graft, continued instability of the knee, and developement of blood clots and risk of anesthesia.  All questions answered.   The patient was identified in the preoperative holding area and the operative extremity was marked. The patient was brought to the operating room and transferred to  operating table in a supine position. Satisfactory general anesthesia was induced by anesthesiology.     Examination under anesthesia revealed a grade 2B Lachman, grade 2 pivot shift, and stable to varus and valgus stress.    The procedure was began by preparing the allograft for the ACL reconstruction.  Per the Arthrex manufactures guidelines we prepared the graft link for the all inside Endobutton fixation technique.  This was tensioned on the back table and measured 8.0 mm on the tibial tunnel and 8.5 mm on the femoral tunnel.  70 mm in length.    Standard anterolateral, anteromedial arthroscopy portals were obtained. The anteromedial portal was obtained with a spinal needle for localization under direct visualization with subsequent diagnostic findings.    Anteromedial and anterolateral chambers: mild synovitis. The synovitis was debrided with a 4.5 mm full radius shaver through both the anteromedial and lateral portals.    Suprapatellar pouch and gutters: Moderate synovitis but no loose bodies. Patella chondral surface: Grade 0 Trochlear chondral surface: Grade 0 Patellofemoral tracking: Midline, no tilt Medial meniscus: Intact, no tear.  Medial femoral condyle flexion bearing surface: Grade 0 Medial femoral condyle extension bearing surface: Grade 2 Medial tibial plateau: Grade 0 Anterior cruciate ligament:Complete proximal disruption of the femoral attachment Posterior cruciate ligament:stable Lateral meniscus: Place tear of the posterior horn with detachment of the posterior root as well as a horizontal component that had the root attached.  This was unstable however.   Lateral femoral condyle flexion bearing surface: Grade 0 Lateral femoral condyle extension bearing surface: Grade 0 Lateral tibial plateau: Grade 1    Lateral meniscus was closely inspected.  The posterior  root was attached to a short undersurface horizontal tear that was complete on the posterior horn.  This was  unstable.  This was resected.  The remainder of the posterior horn of the lateral meniscus was now absent of a posterior root attachment.  So we elected to prepare that for repair.  We utilized a transosseous tunnel in the suture lock anchor from Arthrex.  We used point-to-point guide to drill to the appropriate attachment site for the posterior root.  We then drilled with the Beath pin.  We passed the anchor which was a knotless all suture anchor.  This seated nicely under the cortex.  We then passed 2 simple sutures and deliver these into the knotless repair mechanism of the anchor.  This created good compression to bone.   Next, the ACL reconstruction was undertaken. The ACL stump was removed with thermal ablation and shaver and anatomic bony landmarks were marked for the placement of the femoral and tibial sockets.  Arthrex retroguides and Flipcutters were used to create the sockets and perform the procedure by an all-inside GraftLink technique.  The femoral socket was created at the inferior portion of the bifurcate ridge of the lateral femoral wall with a size 8.5 mm FlipCutter to a depth of  15 mm while the tibial socket was created at the center of the ACL footprint from front to back and toward the base of the medial tibial eminence from medial to lateral, to a depth of 23-25 mm with a 8.0 mm FlipCutter. Bony debris was removed and the edges of socket apertures were smoothed.  Both femoral and tibial sockets were prefilled with STEMI blasts osteo inductive bone graft to help support healing for this allograft.  Suture shuttles were used to deliver the graft into the femoral socket first and the tibial socket second. The graft was then secured within the sockets, cinching the self-locking sutures overtop of the proximal and distal cortical buttons with the knee in a reduced position maintained at 20 degrees flexion while a moderate force posterior drawer was applied.  After this preliminary tensioning, the  knee was placed through several flexion-extension cycles to eliminate any graft settling or excursion and the graft was re-tensioned in the same manner and the sutures were tied over top of the buttons proximally and distally, and the four tibial sided suture arms were secondarily secured at the proximal tibia with 1 SwiveLock anchor. Of note we did also pass a free labral tape through the ACL fixation as an separate internal brace backup fixation.   Final images of the ACL graft were obtained, revealing no lateral wall or roof impingement of the graft at the notch through range of motion.  Stability of the ACL graft was assessed and found to be normalized at grade 0 lachman and grade 0 pivot shift.    The wounds were all closed in layers per usual.  Dressings were applied and a brace placed And locked in 0 of flexion..  There were no apparent complications.  The patient was awakened and taken to recovery room in satisfactory condition.   POSTOPERATIVE PLAN:  Troy [name] will be up to 50% weightbearing for 1 month.  He can begin range of motion up to 90 degrees over the course of the first 4 weeks.  We will limit flexion to 90 degrees for the first month.  Beginning on the fifth week he can progress to full weightbearing as tolerated and range of motion as tolerated and then transition  back to a standard style ACL postoperative protocol.  He will be on 81 mg aspirin twice per day x 6 weeks.  I will see him back in the office in 2 weeks.  Yolonda Kida

## 2023-09-16 NOTE — Progress Notes (Signed)
Assisted Dr. Brock with left, adductor canal, ultrasound guided block. Side rails up, monitors on throughout procedure. See vital signs in flow sheet. Tolerated Procedure well. 

## 2023-09-16 NOTE — Brief Op Note (Signed)
09/16/2023  10:28 AM  PATIENT:  Troy [name]  47 y.o. male  PRE-OPERATIVE DIAGNOSIS:  Left knee anterior cruciate ligament tear, lateral meniscus tear  POST-OPERATIVE DIAGNOSIS:  Left knee anterior cruciate ligament tear, lateral meniscus tear  PROCEDURE:  Procedure(s) with comments: KNEE ARTHROSCOPY WITH ANTERIOR CRUCIATE LIGAMENT (ACL) RECONSTRUCTION WITH HAMSTRING ALLOGRAFT (Left) - 120 REPAIR OF LATERAL MENISCUS (Left) - 120  SURGEON:  Surgeons and Role:    * Yolonda Kida, MD - Primary  PHYSICIAN ASSISTANT: Dion Saucier, PA-C  ANESTHESIA:   regional and general  EBL:  20 mL   BLOOD ADMINISTERED:none  DRAINS: none   LOCAL MEDICATIONS USED:  NONE  SPECIMEN:  No Specimen  DISPOSITION OF SPECIMEN:  N/A  COUNTS:  YES  TOURNIQUET:   Total Tourniquet Time Documented: Thigh (Left) - 72 minutes Total: Thigh (Left) - 72 minutes   DICTATION: .Note written in EPIC  PLAN OF CARE: Discharge to home after PACU  PATIENT DISPOSITION:  PACU - hemodynamically stable.   Delay start of Pharmacological VTE agent (>24hrs) due to surgical blood loss or risk of bleeding: not applicable

## 2023-09-16 NOTE — Discharge Instructions (Addendum)
DISCHARGE INSTRUCTIONS: ________________________________________________________________________________ ACL RECONSTRUCTION HOME EXERCISE PROGRAM (0-2 WEEKS)   Elevate the leg above your heart as often as possible. Up to 50% weightbearing with crutch or walker x 1 month. You should sleep in the knee brace with it locked in full extension.  He may remove for showering.  Otherwise he may remove for exercise. Start normal showering on postoperative day #3.  Do not submerge underwater Goals for first two weeks:  minimal swelling, motion 0-90, walking with brace without crutches and positive attitude about PT. Use pain medication as needed.  You may also take Tylenol and Advil around-the-clock in alternating fashion in addition to the pain medication.  To prevent constipation use Colace 100mg . twice a day while on pain medication.  If constipated, use Miralax 17 gm once a day and drink plenty of fluids.  These medications can be obtained at the pharmacy without a prescription.   Follow up in the office in 14 days. You may remove your postoperative bandages on the third day from surgery and begin showering.  Do not remove the Steri-Strips.  Do not submerge underwater.  Replace your Ace bandage over your wounds before reapplying your knee brace You should also continue to wear the TED hose for 2 weeks postoperatively. You should also take an 81 mg aspirin twice per day x 6 weeks for the prevention of DVT.    Post Anesthesia Home Care Instructions  Activity: Get plenty of rest for the remainder of the day. A responsible individual must stay with you for 24 hours following the procedure.  For the next 24 hours, DO NOT: -Drive a car -Advertising copywriter -Drink alcoholic beverages -Take any medication unless instructed by your physician -Make any legal decisions or sign important papers.  Meals: Start with liquid foods such as gelatin or soup. Progress to regular foods as tolerated. Avoid greasy,  spicy, heavy foods. If nausea and/or vomiting occur, drink only clear liquids until the nausea and/or vomiting subsides. Call your physician if vomiting continues.  Special Instructions/Symptoms: Your throat may feel dry or sore from the anesthesia or the breathing tube placed in your throat during surgery. If this causes discomfort, gargle with warm salt water. The discomfort should disappear within 24 hours.  If you had a scopolamine patch placed behind your ear for the management of post- operative nausea and/or vomiting:  1. The medication in the patch is effective for 72 hours, after which it should be removed.  Wrap patch in a tissue and discard in the trash. Wash hands thoroughly with soap and water. 2. You may remove the patch earlier than 72 hours if you experience unpleasant side effects which may include dry mouth, dizziness or visual disturbances. 3. Avoid touching the patch. Wash your hands with soap and water after contact with the patch.     May have Tylenol today after 1:46 PM  Regional Anesthesia Blocks  1. You may not be able to move or feel the "blocked" extremity after a regional anesthetic block. This may last may last from 3-48 hours after placement, but it will go away. The length of time depends on the medication injected and your individual response to the medication. As the nerves start to wake up, you may experience tingling as the movement and feeling returns to your extremity. If the numbness and inability to move your extremity has not gone away after 48 hours, please call your surgeon.   2. The extremity that is blocked will need to be protected  until the numbness is gone and the strength has returned. Because you cannot feel it, you will need to take extra care to avoid injury. Because it may be weak, you may have difficulty moving it or using it. You may not know what position it is in without looking at it while the block is in effect.  3. For blocks in the legs  and feet, returning to weight bearing and walking needs to be done carefully. You will need to wait until the numbness is entirely gone and the strength has returned. You should be able to move your leg and foot normally before you try and bear weight or walk. You will need someone to be with you when you first try to ensure you do not fall and possibly risk injury.  4. Bruising and tenderness at the needle site are common side effects and will resolve in a few days.  5. Persistent numbness or new problems with movement should be communicated to the surgeon or the [facility] [phone] [facility] [phone]).

## 2023-09-16 NOTE — Transfer of Care (Signed)
Immediate Anesthesia Transfer of Care Note  Patient: Troy [name]  Procedure(s) Performed: KNEE ARTHROSCOPY WITH ANTERIOR CRUCIATE LIGAMENT (ACL) RECONSTRUCTION WITH HAMSTRING ALLOGRAFT (Left: Knee) REPAIR OF LATERAL MENISCUS (Left: Knee)  Patient Location: PACU  Anesthesia Type:GA combined with regional for post-op pain  Level of Consciousness: drowsy, patient cooperative, and responds to stimulation  Airway & Oxygen Therapy: Patient Spontanous Breathing and Patient connected to face mask oxygen  Post-op Assessment: Report given to RN and Post -op Vital signs reviewed and stable  Post vital signs: Reviewed and stable  Last Vitals:  Vitals Value Taken Time  BP 114/58 09/16/23 1033  Temp    Pulse 104 09/16/23 1035  Resp 23 09/16/23 1035  SpO2 97 % 09/16/23 1035  Vitals shown include unfiled device data.  Last Pain:  Vitals:   09/16/23 0743  TempSrc: Oral  PainSc: 5       Patients Stated Pain Goal: 5 (09/16/23 0743)  Complications: No notable events documented.

## 2023-09-16 NOTE — Anesthesia Postprocedure Evaluation (Signed)
Anesthesia Post Note  Patient: Troy [name]  Procedure(s) Performed: KNEE ARTHROSCOPY WITH ANTERIOR CRUCIATE LIGAMENT (ACL) RECONSTRUCTION WITH HAMSTRING ALLOGRAFT (Left: Knee) REPAIR OF LATERAL MENISCUS (Left: Knee)     Patient location during evaluation: PACU Anesthesia Type: General Level of consciousness: awake and alert Pain management: pain level controlled Vital Signs Assessment: post-procedure vital signs reviewed and stable Respiratory status: spontaneous breathing, nonlabored ventilation and respiratory function stable Cardiovascular status: stable and blood pressure returned to baseline Anesthetic complications: no   No notable events documented.  Last Vitals:  Vitals:   09/16/23 1200 09/16/23 1215  BP: (!) 129/97 (!) 123/96  Pulse: 87 85  Resp: 13 (!) 9  Temp:    SpO2: 94% 93%    Last Pain:  Vitals:   09/16/23 1200  TempSrc:   PainSc: 5                  Beryle Lathe

## 2023-09-16 NOTE — Anesthesia Procedure Notes (Signed)
Procedure Name: Intubation Date/Time: 09/16/2023 8:59 AM  Performed by: Thornell Mule, CRNAPre-anesthesia Checklist: Patient identified, Emergency Drugs available, Suction available and Patient being monitored Patient Re-evaluated:Patient Re-evaluated prior to induction Oxygen Delivery Method: Circle system utilized Preoxygenation: Pre-oxygenation with 100% oxygen Induction Type: IV induction Ventilation: Mask ventilation without difficulty Laryngoscope Size: Miller and 3 Grade View: Grade I Tube type: Oral Tube size: 7.5 mm Number of attempts: 1 Airway Equipment and Method: Stylet and Oral airway Placement Confirmation: ETT inserted through vocal cords under direct vision, positive ETCO2 and breath sounds checked- equal and bilateral Secured at: 21 cm Tube secured with: Tape Dental Injury: Teeth and Oropharynx as per pre-operative assessment

## 2023-09-16 NOTE — H&P (Signed)
ORTHOPAEDIC H and P  REQUESTING PHYSICIAN: Yolonda Kida, MD  PCP:  Loyola Mast, MD  Chief Complaint: Left knee anterior cruciate ligament tear  HPI: Troy [name] is a 47 y.o. male who complains of Left knee pain and instability.  Here today for left knee surgery.  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   Chronic fatigue    Drug abuse (HCC)    Family history of breast cancer 05/19/2021   Family history of colon cancer in mother    GERD (gastroesophageal reflux disease)    +Barretts   Hematochezia    hemorrhoids   Hepatic steatosis    +hepatomegaly   History of CVA (cerebrovascular accident)    radiation-induced carotid damage/changes   History of hepatitis C    +treated   History of Hodgkin's lymphoma    age 32->mantle RT + splenectomy   History of splenectomy    Hyperlipidemia    Hypertension    Insomnia    Male hypogonadism    Moderate persistent asthma    Obesity, Class I, BMI 30-34.9    Papillary thyroid carcinoma (HCC) 2010   total thyroidectomy + neck dissection   Postsurgical hypothyroidism    Prediabetes    Pulmonary fibrosis (HCC)    ?rad induced?   Past Surgical History:  Procedure Laterality Date   COLONOSCOPY  07/04/2019   done for hematochezia and FH col ca-mother: Mod int hem w/inflamm.  No adenomatous polyps.   INCISIONAL HERNIA REPAIR  06/2019   RUQ (prev lap append site)   LAPAROSCOPIC APPENDECTOMY N/A 03/25/2017   Procedure: APPENDECTOMY LAPAROSCOPIC; EXTENSIVE LYSIS OF ADHESIONS FROM PRIOR RADIATION AND SURGERY;  Surgeon: Luretha Murphy, MD;  Location: [facility];  Service: General;  Laterality: N/A;   PARATHYROIDECTOMY     SPLENECTOMY     TEE WITHOUT CARDIOVERSION N/A 07/18/2015   Procedure: TRANSESOPHAGEAL ECHOCARDIOGRAM (TEE);  Surgeon: [name], MD;  Location: [facility] ENDOSCOPY;  Service:  Cardiovascular;  Laterality: N/A;   THYROIDECTOMY     Social History   Socioeconomic History   Marital status: Single    Spouse name: Not on file   Number of children: 0   Years of education: Not on file   Highest education level: Not on file  Occupational History   Not on file  Tobacco Use   Smoking status: Former    Current packs/day: 0.00    Types: Cigarettes    Quit date: 2013    Years since quitting: 11.9   Smokeless tobacco: Never  Vaping Use   Vaping status: Never Used  Substance and Sexual Activity   Alcohol use: Not Currently    Comment: occasionally   Drug use: Yes    Types: Marijuana   Sexual activity: Yes  Other Topics Concern   Not on file  Social History Narrative   Are you right handed or left handed? Right   Are you currently employed ? No   What is your current occupation? None   Do you live at home alone? yes   Who lives with you? no   What type of home do you live in: 1 story or 2 story? Third floor apratment      Social Determinants of Health   Financial Resource Strain: Not on file  Food Insecurity: Low Risk  (12/17/2022)  Received from [facility]   Hunger Vital Sign    Worried About Running Out of Food in the Last Year: Never true    Ran Out of Food in the Last Year: Never true  Transportation Needs: No Transportation Needs (12/17/2022)   [facility]   Transportation    In the past 12 months, has lack of reliable transportation kept you from medical appointments, meetings, work or from getting things needed for daily living? : No  Physical Activity: Not on file  Stress: Not on file  Social Connections: Not on file   Family History  Problem Relation Age of Onset   Breast cancer Mother        dx early 35s   Colon cancer Mother        dx early 51s   Other Mother        NTHL1 homozygous   Sleep apnea Mother    Parkinsonism Father    Multiple myeloma Father        dx 58s   Sleep apnea  Father    Breast cancer Maternal Aunt    Stroke Maternal Grandmother    Leukemia Other        dx <20; two paternal first cousins once removed   Allergies  Allergen Reactions   Ciprofloxacin Other (See Comments)    Spontaneous rupture of tendon   Earnestine Leys Officinalis] Anaphylaxis and Hives   Fluocinolone Other (See Comments)    "spontaneous rupture of tendons" per pt dr.   Marcelline Deist [Dapagliflozin]     abscess    Prednisone Rash   Prior to Admission medications   Medication Sig Start Date End Date Taking? Authorizing Provider  albuterol (PROVENTIL) (2.5 MG/3ML) 0.083% nebulizer solution Take 3 mLs (2.5 mg total) by nebulization every 4 (four) hours as needed for wheezing or shortness of breath. 05/13/23  Yes Alfonse Spruce, MD  Albuterol-Budesonide 90-80 MCG/ACT AERO Inhale 2 Inhalations into the lungs every 4 (four) hours as needed. Maximum 12 inhalations per day. 03/29/23  Yes Loyola Mast, MD  aspirin 325 MG tablet Take 1 tablet (325 mg total) by mouth daily. 07/18/15  Yes Ghimire, Werner Lean, MD  Azelastine HCl 137 MCG/SPRAY SOLN USE 1 SPRAY(S) IN EACH NOSTRIL ONCE DAILY AS DIRECTED 06/09/23  Yes Alfonse Spruce, MD  baclofen (LIORESAL) 10 MG tablet Take 1 tablet by mouth three times daily as needed 05/10/22  Yes Rudd, Bertram Millard, MD  bisoprolol (ZEBETA) 10 MG tablet Take 0.5 tablets (5 mg total) by mouth at bedtime. Patient taking differently: Take 5 mg by mouth at bedtime. 5 mg 07/22/23  Yes Chandrasekhar, Mahesh A, MD  budesonide (PULMICORT) 0.5 MG/2ML nebulizer solution Mix with Xopenex nebulizer solution every 6 hours as needed. 04/08/23  Yes Alfonse Spruce, MD  cimetidine (TAGAMET) 200 MG tablet Take 1 tablet daily 05/24/23  Yes Terri Piedra, DO  diphenhydrAMINE HCl (BENADRYL PO) Take by mouth.   Yes [provider]  esomeprazole (NEXIUM) 40 MG capsule TAKE 1 CAPSULE BY MOUTH TWICE DAILY BEFORE A MEAL 01/17/23  Yes Loyola Mast, MD  fluticasone  [facility]) 50 MCG/ACT nasal spray Use 1 spray(s) in each nostril once daily 03/08/22  Yes Loyola Mast, MD  Fluticasone-Umeclidin-Vilant (TRELEGY ELLIPTA) 200-62.5-25 MCG/ACT AEPB INHALE 1 PUFF INTO LUNGS ONCE DAILY 06/07/23  Yes Alfonse Spruce, MD  furosemide (LASIX) 20 MG tablet Take 1 tablet (20 mg total) by mouth 3 (three) times  a week. 12/31/22  Yes Chandrasekhar, Mahesh A, MD  gabapentin (NEURONTIN) 300 MG capsule TAKE 2 Casp am, 2 caps noon, 4 caps bedtime 07/25/23  Yes Loyola Mast, MD  levalbuterol [facility] HFA) 45 MCG/ACT inhaler Inhale 2 puffs into the lungs every 4 (four) hours as needed for wheezing. 05/31/23  Yes Alfonse Spruce, MD  levothyroxine (SYNTHROID) 300 MCG tablet Take 300 mcg by mouth daily before breakfast.   Yes [provider]  Melatonin 10 MG TABS Take by mouth at bedtime.   Yes [provider]  Multiple Vitamin (MULTI-VITAMIN) tablet Take 1 tablet by mouth daily. 07/24/09  Yes [provider]  rosuvastatin (CRESTOR) 40 MG tablet Take 1 tablet by mouth once daily 03/20/23  Yes Loyola Mast, MD  Suvorexant (BELSOMRA) 10 MG TABS TAKE 1 TABLET BY MOUTH AT BEDTIME 08/22/23  Yes Loyola Mast, MD  tamsulosin [facility]) 0.4 MG CAPS capsule Take 1 capsule by mouth once daily 09/18/22  Yes Loyola Mast, MD  traZODone (DESYREL) 150 MG tablet TAKE 2 TABLETS BY MOUTH AT BEDTIME 03/21/23  Yes Loyola Mast, MD  VENTOLIN HFA 108 [phone] Base) MCG/ACT inhaler INHALE 2 PUFFS BY MOUTH EVERY 6 HOURS AS NEEDED FOR SHORTNESS OF BREATH 04/08/23  Yes Alfonse Spruce, MD  ammonium lactate (LAC-HYDRIN) 12 % lotion Apply 1 application topically as needed for dry skin.    [provider]  clindamycin (CLEOCIN T) 1 % lotion Apply topically as needed (rash). 05/25/22   [provider]  doxycycline (VIBRAMYCIN) 100 MG capsule Take 1 with food once a day for 1 month Patient not taking: Reported on 09/05/2023 07/20/23   Terri Piedra,  DO  EPINEPHrine 0.3 mg/0.3 mL IJ SOAJ injection Inject 0.3 mg into the muscle as needed for anaphylaxis. 04/07/23   Alfonse Spruce, MD  Evolocumab (REPATHA SURECLICK) 140 MG/ML SOAJ Inject 140 mg into the skin every 14 (fourteen) days. 05/20/23   Christell Constant, MD  hydrOXYzine (VISTARIL) 50 MG capsule Take 1 capsule (50 mg total) by mouth at bedtime. Patient taking differently: Take 50 mg by mouth as needed. 02/02/21   Loyola Mast, MD  imiquimod Mathis Dad) 5 % cream Apply topically at bedtime. Apply to affected areas of hands at bedtime 09/13/23   Terri Piedra, DO  levalbuterol Pauline Aus) 1.25 MG/3ML nebulizer solution Take 1.25 mg by nebulization every 4 (four) hours as needed for wheezing. 04/08/23   Alfonse Spruce, MD  LUER LOCK SAFETY SYRINGES 22G X 1-1/2" 3 ML MISC USE FOR SELF ADMINISTER OF TESTOSTERONE 2CC ONCE EVERY 2 WEEKS 09/30/20   [provider]  mupirocin ointment (BACTROBAN) 2 % Apply 1 Application topically daily. Covered under a bandage 09/06/23   Paci, Daisey Must, MD  sacubitril-valsartan (ENTRESTO) 49-51 MG Take 1 tablet by mouth 2 (two) times daily. 09/15/23   Christell Constant, MD  testosterone cypionate (DEPOTESTOSTERONE CYPIONATE) 200 MG/ML injection Inject 0.6 mLs (120 mg total) into the muscle once a week. 06/02/22   Loyola Mast, MD   No results found.  Positive ROS: All other systems have been reviewed and were otherwise negative with the exception of those mentioned in the HPI and as above.  Physical Exam: General: Alert, no acute distress Cardiovascular: No pedal edema Respiratory: No cyanosis, no use of accessory musculature GI: No organomegaly, abdomen is soft and non-tender Skin: No lesions in the area of chief complaint Neurologic: Sensation intact distally Psychiatric: Patient is competent  for consent with normal mood and affect Lymphatic: No axillary or cervical lymphadenopathy  MUSCULOSKELETAL: LLE- wwp,  nvi  Assessment: Left knee anterior cruciate ligament tear Left knee lateral meniscus tear  Plan: - plan to proceed today with ACL recon and possible lateral meniscus repair. - The risks, benefits, and alternatives were discussed with the patient. There are risks associated with the surgery including, but not limited to, problems with anesthesia (death), infection, , fracture of bones, loosening or failure of implants, , hematoma (blood accumulation) which may require surgical drainage, blood clots, pulmonary embolism, nerve injury (foot drop), and blood vessel injury. The patient understands these risks and elects to proceed.  - plan to dc home from PACU post o    Yolonda Kida, MD Cell [phone]    09/16/2023 7:33 AM

## 2023-09-16 NOTE — Anesthesia Procedure Notes (Signed)
Anesthesia Regional Block: Adductor canal block   Pre-Anesthetic Checklist: , timeout performed,  Correct Patient, Correct Site, Correct Laterality,  Correct Procedure, Correct Position, site marked,  Risks and benefits discussed,  Surgical consent,  Pre-op evaluation,  At surgeon's request and post-op pain management  Laterality: Left  Prep: chloraprep       Needles:  Injection technique: Single-shot  Needle Type: Echogenic Needle     Needle Length: 10cm  Needle Gauge: 21     Additional Needles:   Narrative:  Start time: 09/16/2023 8:31 AM End time: 09/16/2023 8:34 AM Injection made incrementally with aspirations every 5 mL.  Performed by: Personally  Anesthesiologist: Beryle Lathe, MD  Additional Notes: No pain on injection. No increased resistance to injection. Injection made in 5cc increments. Good needle visualization. Patient tolerated the procedure well.

## 2023-09-19 ENCOUNTER — Ambulatory Visit: Payer: 59

## 2023-09-19 ENCOUNTER — Encounter (HOSPITAL_BASED_OUTPATIENT_CLINIC_OR_DEPARTMENT_OTHER): Payer: Self-pay | Admitting: Orthopedic Surgery

## 2023-09-19 DIAGNOSIS — I2510 Atherosclerotic heart disease of native coronary artery without angina pectoris: Secondary | ICD-10-CM

## 2023-09-22 ENCOUNTER — Other Ambulatory Visit: Payer: Self-pay | Admitting: Family Medicine

## 2023-09-22 DIAGNOSIS — R399 Unspecified symptoms and signs involving the genitourinary system: Secondary | ICD-10-CM

## 2023-09-23 DIAGNOSIS — M25562 Pain in left knee: Secondary | ICD-10-CM | POA: Diagnosis not present

## 2023-09-26 ENCOUNTER — Ambulatory Visit (INDEPENDENT_AMBULATORY_CARE_PROVIDER_SITE_OTHER): Payer: 59 | Admitting: Neurology

## 2023-09-26 DIAGNOSIS — Z9189 Other specified personal risk factors, not elsewhere classified: Secondary | ICD-10-CM

## 2023-09-26 DIAGNOSIS — G4734 Idiopathic sleep related nonobstructive alveolar hypoventilation: Secondary | ICD-10-CM

## 2023-09-26 DIAGNOSIS — G4733 Obstructive sleep apnea (adult) (pediatric): Secondary | ICD-10-CM

## 2023-09-26 DIAGNOSIS — R0683 Snoring: Secondary | ICD-10-CM

## 2023-09-26 DIAGNOSIS — G4700 Insomnia, unspecified: Secondary | ICD-10-CM

## 2023-09-26 DIAGNOSIS — R0681 Apnea, not elsewhere classified: Secondary | ICD-10-CM

## 2023-09-26 DIAGNOSIS — Z820 Family history of epilepsy and other diseases of the nervous system: Secondary | ICD-10-CM

## 2023-09-26 DIAGNOSIS — E663 Overweight: Secondary | ICD-10-CM

## 2023-09-27 ENCOUNTER — Ambulatory Visit: Payer: 59 | Admitting: Allergy & Immunology

## 2023-09-28 ENCOUNTER — Encounter: Payer: Self-pay | Admitting: Family Medicine

## 2023-09-28 NOTE — Procedures (Signed)
[facility]  HOME SLEEP TEST (Watch PAT) REPORT  STUDY DATE: 09/26/2023  DOB: 1976-08-06  MRN: [id number]  ORDERING CLINICIAN: Huston Foley, MD, PhD   REFERRING CLINICIAN: Loyola Mast, MD   CLINICAL INFORMATION/HISTORY: 47 year old male with an underlying complex medical history of asthma, allergies, aortic atherosclerosis, hypertension, skin cancer, thyroid cancer with status post total thyroidectomy and neck dissection, hypothyroidism, hyperlipidemia, Hodgkin's lymphoma with s/p XRT at age [age] yo, hepatitis C, history of CVA, mood disorder and overweight state, who reports snoring and witnessed apneas, per girlfriend.    Epworth sleepiness score: 0/24.  BMI: 29.2 kg/m  FINDINGS:   Sleep Summary:   Total Recording Time (hours, min): 7 hours, 59 min  Total Sleep Time (hours, min):  6 hours, 2 min  Percent REM (%):    14.2%   Respiratory Indices:   Calculated pAHI (per hour):  9.6/hour         REM pAHI:    16.7/hour       NREM pAHI: 8.4/hour  Central pAHI: 0.7/hour  Oxygen Saturation Statistics:    Oxygen Saturation (%) Mean: 91%   Minimum oxygen saturation (%):                 85%   O2 Saturation Range (%): 85-97%    O2 Saturation (minutes) <=88%: 23.2 min  Pulse Rate Statistics:   Pulse Mean (bpm):    81/min    Pulse Range (70 - 105/min)   IMPRESSION: OSA (obstructive sleep apnea) Nocturnal Hypoxemia  RECOMMENDATION:  This home sleep test demonstrates overall mild obstructive sleep apnea with a total AHI of 9.6/hour and O2 nadir of 85%.  Time below or at 88% saturation was over 20 minutes for the night, indicating nocturnal hypoxemia.  Mild to moderate snoring was detected, at times louder.  Sedating medications should be avoided as much as possible.  THC smoking should be discouraged as well. Given the patient's medical history and sleep related complaints, therapy with a  positive airway pressure device is a reasonable  first-line choice and clinically recommended. Treatment can be achieved in the form of autoPAP trial/titration at home for now. A full night, in-lab PAP titration study may aid in improving proper treatment settings and with mask fit, if needed, down the road. Alternative treatments may include weight loss (where appropriate) along with avoidance of the supine sleep position (if possible), or an oral appliance in appropriate candidates.   Please note that untreated obstructive sleep apnea may carry additional perioperative morbidity. Patients with significant obstructive sleep apnea should receive perioperative PAP therapy and the surgeons and particularly the anesthesiologist should be informed of the diagnosis and the severity of the sleep disordered breathing. The patient should be cautioned not to drive, work at heights, or operate dangerous or heavy equipment when tired or sleepy. Review and reiteration of good sleep hygiene measures should be pursued with any patient. Other causes of the patient's symptoms, including circadian rhythm disturbances, an underlying mood disorder, medication effect and/or an underlying medical problem cannot be ruled out based on this test. Clinical correlation is recommended.  The patient and his referring provider will be notified of the test results. The patient will be seen in follow up in sleep clinic at [facility], as necessary.  I certify that I have reviewed the raw data recording prior to the issuance of this report in accordance with the standards of the American Academy of Sleep Medicine (AASM).    INTERPRETING PHYSICIAN:  Huston Foley, MD, PhD Medical Director, [facility]) Diplomat, ABPN (Neurology and Sleep)   [facility] [address] [place], [place] [address] [phone]

## 2023-09-29 ENCOUNTER — Telehealth: Payer: Self-pay | Admitting: *Deleted

## 2023-09-29 DIAGNOSIS — M25562 Pain in left knee: Secondary | ICD-10-CM | POA: Diagnosis not present

## 2023-09-29 NOTE — Telephone Encounter (Signed)
-----   Message from Huston Foley sent at 09/28/2023  1:53 PM EST ----- Patient referred by PCP, seen by me on 08/24/2023, patient had HST on 09/26/2023.    Please call and notify the patient that the recent home sleep test showed obstructive sleep apnea. OSA is overall mild, but worth treating to see if he feels better after treatment. To that end I recommend treatment for this in the form of autoPAP, which means, that we don't have to bring him in for a sleep study with CPAP, but will let him try an autoPAP machine at home, through a DME company (of his choice, or as per insurance requirement). The DME representative will educate him on how to use the machine, how to put the mask on, etc. I have placed an order in the chart. Please send referral, talk to patient, send report to referring MD. We will need a FU in sleep clinic in about 2.-3 months post-PAP set up (which is usually an insurance-mandated appointment to monitor compliance), please arrange that with me or one of our NPs. Please also go over the need for compliance with treatment (including the insurance-imposed minimum compliance percentage).  His oxygen saturations were fluctuating, he did have several drops under the saturation of 88%.  I recommend that he get his prescribing physicians to look at his medication regimen closely, some of the sedating medications may make it more likely for him to stop breathing or have shallow breathing events with oxygen drops.  I also encourage him to stop smoking marijuana completely.  Thanks,   Huston Foley, MD, PhD [facility])

## 2023-09-29 NOTE — Telephone Encounter (Signed)
Called pt and LVM (ok per DPR) advising pt of sleep study results and recommendation to treat with autopap. I left our office number and asked for call back to discuss the next steps.

## 2023-10-03 DIAGNOSIS — M25562 Pain in left knee: Secondary | ICD-10-CM | POA: Diagnosis not present

## 2023-10-06 LAB — BASIC METABOLIC PANEL
BUN/Creatinine Ratio: 15 (ref 9–20)
BUN: 19 mg/dL (ref 6–24)
CO2: 19 mmol/L — ABNORMAL LOW (ref 20–29)
Calcium: 9.6 mg/dL (ref 8.7–10.2)
Chloride: 107 mmol/L — ABNORMAL HIGH (ref 96–106)
Creatinine, Ser: 1.23 mg/dL (ref 0.76–1.27)
Glucose: 102 mg/dL — ABNORMAL HIGH (ref 70–99)
Potassium: 5.3 mmol/L — ABNORMAL HIGH (ref 3.5–5.2)
Sodium: 147 mmol/L — ABNORMAL HIGH (ref 134–144)
eGFR: 74 mL/min/{1.73_m2} (ref >59)

## 2023-10-06 LAB — SPECIMEN STATUS REPORT

## 2023-10-07 ENCOUNTER — Ambulatory Visit: Payer: 59 | Admitting: Dermatology

## 2023-10-07 ENCOUNTER — Encounter: Payer: Self-pay | Admitting: Dermatology

## 2023-10-07 VITALS — BP 107/71 | HR 98

## 2023-10-07 DIAGNOSIS — Z85828 Personal history of other malignant neoplasm of skin: Secondary | ICD-10-CM | POA: Diagnosis not present

## 2023-10-07 DIAGNOSIS — C4491 Basal cell carcinoma of skin, unspecified: Secondary | ICD-10-CM

## 2023-10-07 DIAGNOSIS — L905 Scar conditions and fibrosis of skin: Secondary | ICD-10-CM | POA: Diagnosis not present

## 2023-10-07 DIAGNOSIS — T1490XD Injury, unspecified, subsequent encounter: Secondary | ICD-10-CM

## 2023-10-07 NOTE — Progress Notes (Signed)
   Follow-Up Visit   Subjective  Troy [name] is a 47 y.o. male who presents for the following: Mohs on right posterior neck Pt here for 1 month follow up to mohs of [facility] on right posterior neck/shoulder treated on 09/06/23, left to heal by secondary intention per patient's request since he is an active weight lifter.  The following portions of the chart were reviewed this encounter and updated as appropriate: medications, allergies, medical history  Review of Systems:  No other skin or systemic complaints except as noted in HPI or Assessment and Plan.  Objective  Well appearing patient in no apparent distress; mood and affect are within normal limits.   A focused examination was performed of the following areas: Right posterior neck/shoulder  Relevant exam findings are noted in the Assessment and Plan.    Assessment & Plan   Scar s/p Mohs for [facility] on the right posterior neck/shoulder, treated on 09/06/2023, repaired with secondary intention - Reassured that wound has healed well - Discussed that scars take up to 12 months to mature from the date of surgery - Recommend SPF 30+ to scar daily to prevent purple color - OK to start scar massage at 4-6 weeks post-op - Can consider silicone based products for scar healing - Continue vaseline for another 2 weeks   No follow-ups on file.  I, Tillie Fantasia, CMA, am acting as scribe for Gwenith Daily, MD.   Documentation: I have reviewed the above documentation for accuracy and completeness, and I agree with the above.  Gwenith Daily, MD

## 2023-10-07 NOTE — Patient Instructions (Signed)

## 2023-10-17 ENCOUNTER — Other Ambulatory Visit: Payer: Self-pay

## 2023-10-17 ENCOUNTER — Emergency Department (HOSPITAL_BASED_OUTPATIENT_CLINIC_OR_DEPARTMENT_OTHER): Payer: 59

## 2023-10-17 ENCOUNTER — Emergency Department (HOSPITAL_BASED_OUTPATIENT_CLINIC_OR_DEPARTMENT_OTHER)
Admission: EM | Admit: 2023-10-17 | Discharge: 2023-10-17 | Disposition: A | Discharge status: Home / Self Care | Payer: 59 | Attending: Emergency Medicine | Admitting: Emergency Medicine

## 2023-10-17 ENCOUNTER — Encounter (HOSPITAL_BASED_OUTPATIENT_CLINIC_OR_DEPARTMENT_OTHER): Payer: Self-pay | Admitting: Emergency Medicine

## 2023-10-17 DIAGNOSIS — M79662 Pain in left lower leg: Secondary | ICD-10-CM | POA: Insufficient documentation

## 2023-10-17 DIAGNOSIS — R600 Localized edema: Secondary | ICD-10-CM | POA: Diagnosis present

## 2023-10-17 DIAGNOSIS — M7989 Other specified soft tissue disorders: Secondary | ICD-10-CM | POA: Diagnosis not present

## 2023-10-17 NOTE — Discharge Instructions (Signed)
 Make sure to ice and elevate your leg  Call [facility] tomorrow to be seen.  They recommend calling 8 AM when they first open to make an appointment for tomorrow as Dr. Aundria Rud is working in the office.  Return for new or worsening symptoms

## 2023-10-17 NOTE — ED Triage Notes (Signed)
 Left knee surgery 4 weeks ago , concerned for DVT , reports severe pain and swelling .  Denies shortness of breath or chest pain .

## 2023-10-17 NOTE — ED Provider Notes (Signed)
 [facility] Provider Note   CSN: [id number] Arrival date & time: 10/17/23  1804     History  Left leg pain   Troy [name] is a 48 y.o. male for evaluation left lower extremity swelling.  He is 4 weeks s/p arthroscopic left ACL repair with Dr. Sharl with [facility].  Was doing well up until a week and a half ago he developed pain and swelling from mid calf distally.  He denies any redness, warmth to extremity.  No chest pain, shortness of breath.  No history of PE or DVT.  He does not recall specific enticing injury however he has gone back to weightlifting however he has not been doing lower extremity workouts he has been wearing his splint provided to him from orthopedics.  He denies any numbness or weakness.  No meds PTA.  HPI     Home Medications Prior to Admission medications   Medication Sig Start Date End Date Taking? Authorizing Provider  albuterol  (PROVENTIL ) (2.5 MG/3ML) 0.083% nebulizer solution Take 3 mLs (2.5 mg total) by nebulization every 4 (four) hours as needed for wheezing or shortness of breath. 05/13/23   Iva Marty Saltness, MD  Albuterol -Budesonide  90-80 MCG/ACT AERO Inhale 2 Inhalations into the lungs every 4 (four) hours as needed. Maximum 12 inhalations per day. 03/29/23   Thedora Garnette HERO, MD  ammonium lactate (LAC-HYDRIN) 12 % lotion Apply 1 application topically as needed for dry skin.    [provider]  aspirin  325 MG tablet Take 1 tablet (325 mg total) by mouth daily. 07/18/15   Ghimire, Donalda HERO, MD  Azelastine  HCl 137 MCG/SPRAY SOLN USE 1 SPRAY(S) IN EACH NOSTRIL ONCE DAILY AS DIRECTED 06/09/23   Iva Marty Saltness, MD  baclofen  (LIORESAL ) 10 MG tablet Take 1 tablet by mouth three times daily as needed 05/10/22   Thedora Garnette HERO, MD  bisoprolol  (ZEBETA ) 10 MG tablet Take 0.5 tablets (5 mg total) by mouth at bedtime. Patient taking differently: Take 5 mg by mouth at bedtime. 5 mg 07/22/23    Chandrasekhar, Stanly A, MD  budesonide  (PULMICORT ) 0.5 MG/2ML nebulizer solution Mix with Xopenex  nebulizer solution every 6 hours as needed. 04/08/23   Iva Marty Saltness, MD  cimetidine  (TAGAMET ) 200 MG tablet Take 1 tablet daily 05/24/23   Alm Delon SAILOR, DO  clindamycin  (CLEOCIN  T) 1 % lotion Apply topically as needed (rash). 05/25/22   [provider]  diphenhydrAMINE  HCl (BENADRYL  PO) Take by mouth.    [provider]  doxycycline  (VIBRAMYCIN ) 100 MG capsule Take 1 with food once a day for 1 month Patient not taking: Reported on 09/05/2023 07/20/23   Alm Delon SAILOR, DO  EPINEPHrine  0.3 mg/0.3 mL IJ SOAJ injection Inject 0.3 mg into the muscle as needed for anaphylaxis. 04/07/23   Iva Marty Saltness, MD  esomeprazole  (NEXIUM ) 40 MG capsule TAKE 1 CAPSULE BY MOUTH TWICE DAILY BEFORE A MEAL 01/17/23   Thedora Garnette HERO, MD  Evolocumab  (REPATHA  SURECLICK) 140 MG/ML SOAJ Inject 140 mg into the skin every 14 (fourteen) days. 05/20/23   Santo Stanly LABOR, MD  fluticasone  (FLONASE ) 50 MCG/ACT nasal spray Use 1 spray(s) in each nostril once daily 03/08/22   Thedora Garnette HERO, MD  Fluticasone -Umeclidin-Vilant (TRELEGY ELLIPTA ) 200-62.5-25 MCG/ACT AEPB INHALE 1 PUFF INTO LUNGS ONCE DAILY 06/07/23   Iva Marty Saltness, MD  furosemide  (LASIX ) 20 MG tablet Take 1 tablet (20 mg total) by mouth 3 (three) times a week. 12/31/22  Santo Kelly A, MD  gabapentin  (NEURONTIN ) 300 MG capsule TAKE 2 Casp am, 2 caps noon, 4 caps bedtime 07/25/23   Thedora Garnette HERO, MD  hydrOXYzine  (VISTARIL ) 50 MG capsule Take 1 capsule (50 mg total) by mouth at bedtime. Patient taking differently: Take 50 mg by mouth as needed. 02/02/21   Thedora Garnette HERO, MD  imiquimod  (ALDARA ) 5 % cream Apply topically at bedtime. Apply to affected areas of hands at bedtime 09/13/23   Alm Delon SAILOR, DO  levalbuterol  (XOPENEX  HFA) 45 MCG/ACT inhaler Inhale 2 puffs into the lungs every 4 (four) hours as needed for  wheezing. 05/31/23   Iva Marty Saltness, MD  levalbuterol  (XOPENEX ) 1.25 MG/3ML nebulizer solution Take 1.25 mg by nebulization every 4 (four) hours as needed for wheezing. 04/08/23   Iva Marty Saltness, MD  levothyroxine  (SYNTHROID ) 300 MCG tablet Take 300 mcg by mouth daily before breakfast.    [provider]  LUER LOCK SAFETY SYRINGES 22G X 1-1/2 3 ML MISC USE FOR SELF ADMINISTER OF TESTOSTERONE  2CC ONCE EVERY 2 WEEKS 09/30/20   [provider]  Melatonin 10 MG TABS Take by mouth at bedtime.    [provider]  Multiple Vitamin (MULTI-VITAMIN) tablet Take 1 tablet by mouth daily. 07/24/09   [provider]  mupirocin  ointment (BACTROBAN ) 2 % Apply 1 Application topically daily. Covered under a bandage 09/06/23   Paci, Rufus HERO, MD  ondansetron  (ZOFRAN ) 4 MG tablet Take 1 tablet (4 mg total) by mouth every 8 (eight) hours as needed for vomiting or nausea. 09/16/23   Sharl Selinda Dover, MD  oxyCODONE  (ROXICODONE ) 5 MG immediate release tablet Take 1 tablet (5 mg total) by mouth every 4 (four) hours as needed for moderate pain (pain score 4-6) or severe pain (pain score 7-10). 09/16/23 09/15/24  Sharl Selinda Dover, MD  rosuvastatin  (CRESTOR ) 40 MG tablet Take 1 tablet by mouth once daily 03/20/23   Thedora Garnette HERO, MD  sacubitril-valsartan (ENTRESTO ) 49-51 MG Take 1 tablet by mouth 2 (two) times daily. 09/15/23   Santo Kelly LABOR, MD  Suvorexant  (BELSOMRA ) 10 MG TABS TAKE 1 TABLET BY MOUTH AT BEDTIME 08/22/23   Thedora Garnette HERO, MD  tamsulosin  (FLOMAX ) 0.4 MG CAPS capsule Take 1 capsule by mouth once daily 09/22/23   Thedora Garnette HERO, MD  testosterone  cypionate (DEPOTESTOSTERONE CYPIONATE) 200 MG/ML injection Inject 0.6 mLs (120 mg total) into the muscle once a week. 06/02/22   Thedora Garnette HERO, MD  traZODone  (DESYREL ) 150 MG tablet TAKE 2 TABLETS BY MOUTH AT BEDTIME 03/21/23   Thedora Garnette HERO, MD  VENTOLIN  HFA 108 [phone] Base) MCG/ACT inhaler INHALE 2 PUFFS BY  MOUTH EVERY 6 HOURS AS NEEDED FOR SHORTNESS OF BREATH 04/08/23   Iva Marty Saltness, MD      Allergies    Ciprofloxacin, Sage [salvia officinalis], Fluocinolone, Farxiga  [dapagliflozin ], and Prednisone     Review of Systems   Review of Systems  Constitutional: Negative.   HENT: Negative.    Respiratory: Negative.    Cardiovascular: Negative.   Gastrointestinal: Negative.   Genitourinary: Negative.   Musculoskeletal:        Left calf pain and swelling  Skin: Negative.   Neurological: Negative.   All other systems reviewed and are negative.   Physical Exam Updated Vital Signs BP 127/76   Pulse 93   Temp 97.8 F (36.6 C) (Oral)   Resp 18   Wt 88.5 kg   SpO2 97%   BMI 26.45 kg/m  Physical Exam Vitals and nursing note reviewed.  Constitutional:      General: He is not in acute distress.    Appearance: He is well-developed. He is not ill-appearing, toxic-appearing or diaphoretic.  HENT:     Head: Atraumatic.     Nose: Nose normal.     Mouth/Throat:     Mouth: Mucous membranes are moist.  Eyes:     Pupils: Pupils are equal, round, and reactive to light.  Cardiovascular:     Rate and Rhythm: Normal rate and regular rhythm.     Pulses: Normal pulses.          Dorsalis pedis pulses are 2+ on the right side and 2+ on the left side.     Heart sounds: Normal heart sounds.  Pulmonary:     Effort: Pulmonary effort is normal. No respiratory distress.     Breath sounds: Normal breath sounds.  Abdominal:     General: Bowel sounds are normal. There is no distension.     Palpations: Abdomen is soft.  Musculoskeletal:        General: Normal range of motion.     Cervical back: Normal range of motion and neck supple.     Comments: Tenderness left popliteal fossa and mid calf.  No redness or warmth.  Able to flex and extend at knee, ankle distally.  Compartments soft.  No bony tenderness.  Skin:    General: Skin is warm and dry.     Capillary Refill: Capillary refill takes less  than 2 seconds.     Comments: No obvious rashes or lesions to exposed skin  Neurological:     General: No focal deficit present.     Mental Status: He is alert and oriented to person, place, and time.     Comments: Intact sensation, ambulatory, equal strength     ED Results / Procedures / Treatments   Labs (all labs ordered are listed, but only abnormal results are displayed) Labs Reviewed - No data to display  EKG None  Radiology US  Venous Img Lower Unilateral Left Result Date: 10/17/2023 CLINICAL DATA:  Left lower leg swelling, pain EXAM: LEFT LOWER EXTREMITY VENOUS DOPPLER ULTRASOUND TECHNIQUE: Gray-scale sonography with compression, as well as color and duplex ultrasound, were performed to evaluate the deep venous system(s) from the level of the common femoral vein through the popliteal and proximal calf veins. COMPARISON:  None Available. FINDINGS: VENOUS Normal compressibility of the common femoral, superficial femoral, and popliteal veins, as well as the visualized calf veins. Visualized portions of profunda femoral vein and great saphenous vein unremarkable. No filling defects to suggest DVT on grayscale or color Doppler imaging. Doppler waveforms show normal direction of venous flow, normal respiratory plasticity and response to augmentation. Limited views of the contralateral common femoral vein are unremarkable. OTHER Complex fluid collections noted in the popliteal fossa measuring 3.5 x 2.2 x 3.9 cm and in the medial left calf measuring 11.6 x 3.3 x 1.6 cm. Question hematomas. Limitations: none IMPRESSION: No evidence of left lower extremity DVT. Complex fluid collections in the left popliteal fossa and medial left calf, question hematomas. Electronically Signed   By: Franky Crease M.D.   On: 10/17/2023 19:26    Procedures Procedures    Medications Ordered in ED Medications - No data to display  ED Course/ Medical Decision Making/ A&P Clinical Course as of 10/17/23 2051  Mon  Oct 17, 2023  2001 Dr. Kay with [facility], rec FU tomorrow in office. [  BH]    Clinical Course User Index [BH] Jahmil Macleod A, PA-C   48 year old 1 month s/p arthroscopic ACL repair here for left lower extremity pain which started about a week and a half ago.  He has pain to his left popliteal fossa and mid calf.  Diffuse swelling.  He is neurovascularly intact.  No redness, warmth or crepitus.  He denies any new specific injuries.  Imaging personally viewed and interpreted:  US  neg for DVT, does show nonspecific fluid collection left popliteal fossa and mid calf, question hematoma?  Discussed with Dr. Mariea on-call for Dr. Sharl.  He recommends follow-up in office in a.m. tomorrow morning.  Patient had gentle Ace wrap placed.  Discussed elevation, Tylenol , Motrin, ice and close follow-up outpatient  He will return for new or worsening symptoms.  At this time I have low suspicion for ischemia, compartment syndrome, infected hematoma, septic joint, gout, hemarthrosis, he has full range of motion to his lower extremity have low suspicion for Achilles tendon rupture.  US  negative for VTE.  Patient seen by attending, Dr. Franklyn who is agreeable to treatment, plan disposition  The patient has been appropriately medically screened and/or stabilized in the ED. I have low suspicion for any other emergent medical condition which would require further screening, evaluation or treatment in the ED or require inpatient management.  Patient is hemodynamically stable and in no acute distress.  Patient able to ambulate in department prior to ED.  Evaluation does not show acute pathology that would require ongoing or additional emergent interventions while in the emergency department or further inpatient treatment.  I have discussed the diagnosis with the patient and answered all questions.  Pain is been managed while in the emergency department and patient has no further complaints prior to  discharge.  Patient is comfortable with plan discussed in room and is stable for discharge at this time.  I have discussed strict return precautions for returning to the emergency department.  Patient was encouraged to follow-up with PCP/specialist refer to at discharge.                                Medical Decision Making Amount and/or Complexity of Data Reviewed External Data Reviewed: labs, radiology and notes. Radiology: ordered and independent interpretation performed. Decision-making details documented in ED Course.  Risk OTC drugs. Prescription drug management. Decision regarding hospitalization. Diagnosis or treatment significantly limited by social determinants of health.           Final Clinical Impression(s) / ED Diagnoses Final diagnoses:  Pain of left calf    Rx / DC Orders ED Discharge Orders     None         Jozef Eisenbeis A, PA-C 10/17/23 2052    Franklyn Sid SAILOR, MD 10/19/23 1554

## 2023-10-18 ENCOUNTER — Encounter: Payer: Self-pay | Admitting: Pharmacist

## 2023-10-18 ENCOUNTER — Encounter: Payer: Self-pay | Admitting: Student

## 2023-10-18 ENCOUNTER — Ambulatory Visit: Payer: 59 | Attending: Cardiology | Admitting: Student

## 2023-10-18 VITALS — BP 110/80 | HR 88

## 2023-10-18 DIAGNOSIS — E785 Hyperlipidemia, unspecified: Secondary | ICD-10-CM | POA: Diagnosis not present

## 2023-10-18 NOTE — Patient Instructions (Addendum)
 Will get updated lipid and liver function test today and will implement changes to lipid medications accordingly.

## 2023-10-18 NOTE — Progress Notes (Signed)
 Patient ID: Troy [name]                 DOB: 07/12/1976                    MRN: [id number]      HPI: Troy [name] is a 48 y.o. male patient referred to lipid clinic by Dr.Chandrasekhar. PMH is significant for HFrEF, HTN, aortic atherosclerosis, hx of stroke, hepatitis C,Hodgkin's Lymphoma s/p high dose radiation, prior pulmonary fibrosis and asthma, tobacco abuse, current marijuana use   Patient saw Troy [name], PharmD early Aug, 2024 Repatha  was added to high intensity statins and Zetia  was d/c to reduce pill burden. Last lipid lab and last LFT showed LDLc above goal and elevated liver enzymes. Patient reported that could be from him drinking excessive acohol. Patient was advised to cut down on alcohol.  Today he presented for follow up. Reports he is off of steroids and has cut down on Etoh significantly. He still drinks but only 4-6 beers 3 times per week. Girlfriend in and out of methadone clinic, and it is stressful situation.he checks his BP at  home and it  ~115/85 heart rate 80's. He thinks he is on lots of medications. We went through whole list and clear the medications that he is currently not on.  He does not want to talk about any lipid lowering medications and his LDLc goal without seeing updated labs result    Current Medications: Crestor  40 mg daily and Repatha  140 mg [place] every 14 days  Intolerances: none  Risk Factors: premature ASCVD, HLD,  LDL goal: <55 mg/dl     Exercise: none due to lower leg injuries - will be going for surgery soon   Family History:  Relation Problem Comments  Mother (Alive) Breast cancer dx early 16s  Colon cancer dx early 61s  Other NTHL1 homozygous  Sleep apnea     Father (Alive) Multiple myeloma dx 104s  Parkinsonism   Sleep apnea     Maternal Aunt Breast cancer     Maternal Grandmother (Deceased) Stroke       Labs: Lipid Panel     Component Value Date/Time   CHOL 139 09/12/2023 1026   TRIG 87 09/12/2023 1026   HDL 39 (L)  09/12/2023 1026   CHOLHDL 3.6 09/12/2023 1026   CHOLHDL 3 05/05/2021 1137   VLDL 17.2 05/05/2021 1137   LDLCALC 83 09/12/2023 1026   LABVLDL 17 09/12/2023 1026    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   Chronic fatigue    Drug abuse (HCC)    Family history of breast cancer 05/19/2021   Family history of colon cancer in mother    GERD (gastroesophageal reflux disease)    +Barretts   Hematochezia    hemorrhoids   Hepatic steatosis    +hepatomegaly   History of CVA (cerebrovascular accident)    radiation-induced carotid damage/changes   History of hepatitis C    +treated   History of Hodgkin's lymphoma    age 69->mantle RT + splenectomy   History of splenectomy    Hyperlipidemia    Hypertension    Insomnia    Male hypogonadism    Moderate persistent asthma    Obesity, Class I, BMI 30-34.9    Papillary thyroid  carcinoma (HCC) 2010  total thyroidectomy + neck dissection   Postsurgical hypothyroidism    Prediabetes    Pulmonary fibrosis (HCC)    ?rad induced?    Current Outpatient Medications on File Prior to Visit  Medication Sig Dispense Refill   albuterol  (PROVENTIL ) (2.5 MG/3ML) 0.083% nebulizer solution Take 3 mLs (2.5 mg total) by nebulization every 4 (four) hours as needed for wheezing or shortness of breath. 75 mL 1   Albuterol -Budesonide  90-80 MCG/ACT AERO Inhale 2 Inhalations into the lungs every 4 (four) hours as needed. Maximum 12 inhalations per day. 10.7 g 2   ammonium lactate (LAC-HYDRIN) 12 % lotion Apply 1 application topically as needed for dry skin.     aspirin  325 MG tablet Take 1 tablet (325 mg total) by mouth daily. 30 tablet 0   Azelastine  HCl 137 MCG/SPRAY SOLN USE 1 SPRAY(S) IN EACH NOSTRIL ONCE DAILY AS DIRECTED 30 mL 5   baclofen  (LIORESAL ) 10 MG tablet Take 1 tablet by mouth three times daily as needed 30 tablet 5    bisoprolol  (ZEBETA ) 10 MG tablet Take 0.5 tablets (5 mg total) by mouth at bedtime. (Patient taking differently: Take 5 mg by mouth at bedtime. 5 mg)     budesonide  (PULMICORT ) 0.5 MG/2ML nebulizer solution Mix with Xopenex  nebulizer solution every 6 hours as needed. 120 mL 2   cimetidine  (TAGAMET ) 200 MG tablet Take 1 tablet daily 30 tablet 2   clindamycin  (CLEOCIN  T) 1 % lotion Apply topically as needed (rash).     diphenhydrAMINE  HCl (BENADRYL  PO) Take by mouth.     doxycycline  (VIBRAMYCIN ) 100 MG capsule Take 1 with food once a day for 1 month (Patient not taking: Reported on 09/05/2023) 30 capsule 1   EPINEPHrine  0.3 mg/0.3 mL IJ SOAJ injection Inject 0.3 mg into the muscle as needed for anaphylaxis. 2 each 2   esomeprazole  (NEXIUM ) 40 MG capsule TAKE 1 CAPSULE BY MOUTH TWICE DAILY BEFORE A MEAL 180 capsule 3   Evolocumab  (REPATHA  SURECLICK) 140 MG/ML SOAJ Inject 140 mg into the skin every 14 (fourteen) days. 6 mL 3   fluticasone  (FLONASE ) 50 MCG/ACT nasal spray Use 1 spray(s) in each nostril once daily 16 g 11   Fluticasone -Umeclidin-Vilant (TRELEGY ELLIPTA ) 200-62.5-25 MCG/ACT AEPB INHALE 1 PUFF INTO LUNGS ONCE DAILY 60 each 5   furosemide  (LASIX ) 20 MG tablet Take 1 tablet (20 mg total) by mouth 3 (three) times a week. 36 tablet 3   gabapentin  (NEURONTIN ) 300 MG capsule TAKE 2 Casp am, 2 caps noon, 4 caps bedtime 240 capsule 3   hydrOXYzine  (VISTARIL ) 50 MG capsule Take 1 capsule (50 mg total) by mouth at bedtime. (Patient taking differently: Take 50 mg by mouth as needed.) 90 capsule 3   imiquimod  (ALDARA ) 5 % cream Apply topically at bedtime. Apply to affected areas of hands at bedtime 24 each 1   levalbuterol  (XOPENEX  HFA) 45 MCG/ACT inhaler Inhale 2 puffs into the lungs every 4 (four) hours as needed for wheezing. 2 each 4   levalbuterol  (XOPENEX ) 1.25 MG/3ML nebulizer solution Take 1.25 mg by nebulization every 4 (four) hours as needed for wheezing. 120 mL 1   levothyroxine  (SYNTHROID )  300 MCG tablet Take 300 mcg by mouth daily before breakfast.     LUER LOCK SAFETY SYRINGES 22G X 1-1/2 3 ML MISC USE FOR SELF ADMINISTER OF TESTOSTERONE  2CC ONCE EVERY 2 WEEKS     Melatonin 10 MG TABS Take by mouth at bedtime.     Multiple Vitamin (MULTI-VITAMIN) tablet  Take 1 tablet by mouth daily.     mupirocin  ointment (BACTROBAN ) 2 % Apply 1 Application topically daily. Covered under a bandage 22 g 0   ondansetron  (ZOFRAN ) 4 MG tablet Take 1 tablet (4 mg total) by mouth every 8 (eight) hours as needed for vomiting or nausea. 20 tablet 0   oxyCODONE  (ROXICODONE ) 5 MG immediate release tablet Take 1 tablet (5 mg total) by mouth every 4 (four) hours as needed for moderate pain (pain score 4-6) or severe pain (pain score 7-10). 25 tablet 0   rosuvastatin  (CRESTOR ) 40 MG tablet Take 1 tablet by mouth once daily 90 tablet 3   sacubitril-valsartan (ENTRESTO ) 49-51 MG Take 1 tablet by mouth 2 (two) times daily. 180 tablet 3   Suvorexant  (BELSOMRA ) 10 MG TABS TAKE 1 TABLET BY MOUTH AT BEDTIME 30 tablet 5   tamsulosin  (FLOMAX ) 0.4 MG CAPS capsule Take 1 capsule by mouth once daily 30 capsule 2   testosterone  cypionate (DEPOTESTOSTERONE CYPIONATE) 200 MG/ML injection Inject 0.6 mLs (120 mg total) into the muscle once a week. 10 mL 0   traZODone  (DESYREL ) 150 MG tablet TAKE 2 TABLETS BY MOUTH AT BEDTIME 180 tablet 3   VENTOLIN  HFA 108 (90 Base) MCG/ACT inhaler INHALE 2 PUFFS BY MOUTH EVERY 6 HOURS AS NEEDED FOR SHORTNESS OF BREATH 18 g 1   No current facility-administered medications on file prior to visit.    Allergies  Allergen Reactions   Ciprofloxacin Other (See Comments)    Spontaneous rupture of tendon   Emelia Sprawls Officinalis] Anaphylaxis and Hives   Fluocinolone Other (See Comments)    spontaneous rupture of tendons per pt dr.   Farxiga  [Dapagliflozin ]     abscess    Prednisone  Rash    Assessment/Plan:  1. Hyperlipidemia -  LDLc goal <55 mg/dl due to hx of premature ASCVD. Last  LDLc was not at goal (83 mg/dl -87/7975) takes and tolerate Crestor  40 gm and Repatha  140 mg [place] Q 14 d. LFT's was elevated mainly due to excessive alcohol intake.Patient has lowered [place] consumption will repeat LFT and lipid labs today.If LFT still remain elevated may need to hold Crestor      Thank you,  Robbi Blanch, Pharm.D [facility] A Division of [facility] [address], [place], [place] [address]  Phone: [phone]; Fax: [phone]

## 2023-10-19 DIAGNOSIS — M25562 Pain in left knee: Secondary | ICD-10-CM | POA: Diagnosis not present

## 2023-10-21 DIAGNOSIS — M25562 Pain in left knee: Secondary | ICD-10-CM | POA: Diagnosis not present

## 2023-10-24 DIAGNOSIS — E890 Postprocedural hypothyroidism: Secondary | ICD-10-CM | POA: Diagnosis not present

## 2023-10-24 DIAGNOSIS — G4733 Obstructive sleep apnea (adult) (pediatric): Secondary | ICD-10-CM | POA: Diagnosis not present

## 2023-10-24 DIAGNOSIS — D751 Secondary polycythemia: Secondary | ICD-10-CM | POA: Diagnosis not present

## 2023-10-24 DIAGNOSIS — C73 Malignant neoplasm of thyroid gland: Secondary | ICD-10-CM | POA: Diagnosis not present

## 2023-10-27 DIAGNOSIS — Z4789 Encounter for other orthopedic aftercare: Secondary | ICD-10-CM | POA: Diagnosis not present

## 2023-10-27 DIAGNOSIS — S83511D Sprain of anterior cruciate ligament of right knee, subsequent encounter: Secondary | ICD-10-CM | POA: Diagnosis not present

## 2023-10-28 DIAGNOSIS — M25562 Pain in left knee: Secondary | ICD-10-CM | POA: Diagnosis not present

## 2023-10-29 ENCOUNTER — Other Ambulatory Visit: Payer: Self-pay | Admitting: Allergy & Immunology

## 2023-11-01 DIAGNOSIS — D751 Secondary polycythemia: Secondary | ICD-10-CM | POA: Diagnosis not present

## 2023-11-01 DIAGNOSIS — C73 Malignant neoplasm of thyroid gland: Secondary | ICD-10-CM | POA: Diagnosis not present

## 2023-11-01 DIAGNOSIS — E890 Postprocedural hypothyroidism: Secondary | ICD-10-CM | POA: Diagnosis not present

## 2023-11-02 DIAGNOSIS — M25562 Pain in left knee: Secondary | ICD-10-CM | POA: Diagnosis not present

## 2023-11-04 DIAGNOSIS — M25562 Pain in left knee: Secondary | ICD-10-CM | POA: Diagnosis not present

## 2023-11-07 ENCOUNTER — Encounter: Payer: Self-pay | Admitting: Hematology

## 2023-11-07 ENCOUNTER — Other Ambulatory Visit: Payer: Self-pay | Admitting: Family Medicine

## 2023-11-07 DIAGNOSIS — G63 Polyneuropathy in diseases classified elsewhere: Secondary | ICD-10-CM

## 2023-11-09 DIAGNOSIS — M25562 Pain in left knee: Secondary | ICD-10-CM | POA: Diagnosis not present

## 2023-11-10 ENCOUNTER — Ambulatory Visit: Payer: 59 | Admitting: Dermatology

## 2023-11-11 ENCOUNTER — Encounter: Payer: Self-pay | Admitting: Adult Health

## 2023-11-11 ENCOUNTER — Ambulatory Visit (INDEPENDENT_AMBULATORY_CARE_PROVIDER_SITE_OTHER): Payer: 59 | Admitting: Adult Health

## 2023-11-11 VITALS — BP 140/85 | HR 87 | Ht 72.0 in | Wt 198.0 lb

## 2023-11-11 DIAGNOSIS — F3181 Bipolar II disorder: Secondary | ICD-10-CM | POA: Diagnosis not present

## 2023-11-11 DIAGNOSIS — F4310 Post-traumatic stress disorder, unspecified: Secondary | ICD-10-CM | POA: Diagnosis not present

## 2023-11-11 DIAGNOSIS — F603 Borderline personality disorder: Secondary | ICD-10-CM | POA: Diagnosis not present

## 2023-11-11 DIAGNOSIS — F6081 Narcissistic personality disorder: Secondary | ICD-10-CM

## 2023-11-11 DIAGNOSIS — M25562 Pain in left knee: Secondary | ICD-10-CM | POA: Diagnosis not present

## 2023-11-11 DIAGNOSIS — F602 Antisocial personality disorder: Secondary | ICD-10-CM

## 2023-11-11 MED ORDER — RISPERIDONE 0.5 MG PO TABS: 0.5000 mg | ORAL_TABLET | Freq: Every day | ORAL | 2 refills | Status: DC

## 2023-11-11 NOTE — Progress Notes (Signed)
[facility] MD/PA/NP Initial Note  11/11/2023 12:16 PM Troy [name]  MRN:  [id number]  Chief Complaint:   HPI:   Patient seen today for initial psychiatric evaluation.   Describes mood today as "not too good". Pleasant. Tearful at times. Mood symptoms - reports periods of depression. Varying interest and motivation. Reports anxiety about things he can't control. Reports irritability - "I pop off really hard" - "I get really mad for about 2 minutes and then I'm ok". Denies panic attacks. Reports being an over thinker - more so at night when trying to go to sleep. Denies worry and rumination. Reports obsessions - weight lifting. Feels like he is a person that's "all in or all out". Reports mood swings - "fluctuations throughout the day" - "flipping a switch". Stating "my mood swings are fast". Reports taking current medications as prescribed. Would like to consider other options - medication and therapy.  Energy levels lower. Active, has a regular exercise routine - power lifting. Enjoys some usual interests and activities. Single. Lives alone in an apartment. Parents local. Has 2 sibling - estranged. Spending time with family. Appetite adequate.  Weight stable. Reports sleeping difficulties. Averages 4 hours a night with a nap for 2 to 3 hours some days. Recently got a CPAP machine but has been unable to use it.  Reports focus and concentration difficulties. Completing tasks. Managing aspects of household. Reports he is permanently and totally disabled - strokes. Reports SI without a plan - ongoing issue since. Denies HI.  Denies AH or VH. Denies self harm - history of. Reports substance use smoking TCH, kratom and mushrooms. 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 Consumes alcohol 2 to 3 days a week - 5 to 6 beers.  Previous medication trials:  Trazadone, Gabapentin, Depakote, Wellbutrin, Zoloft, Sonata, Seroquel  Visit Diagnosis:    ICD-10-CM   1. Borderline personality  disorder (HCC)  F60.3 risperiDONE (RISPERDAL) 0.5 MG tablet    2. Bipolar II disorder (HCC)  F31.81 risperiDONE (RISPERDAL) 0.5 MG tablet    3. Antisocial personality disorder (HCC)  F60.2     4. PTSD (post-traumatic stress disorder)  F43.10     5. 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   Chronic fatigue    Drug abuse (HCC)    Family history of breast cancer 05/19/2021   Family history of colon cancer in mother    GERD (gastroesophageal reflux disease)    +Barretts   Hematochezia    hemorrhoids   Hepatic steatosis    +hepatomegaly   History of CVA (cerebrovascular accident)    radiation-induced carotid damage/changes   History of hepatitis C    +treated   History of Hodgkin's lymphoma    age 59->mantle RT + splenectomy   History of splenectomy    Hyperlipidemia    Hypertension    Insomnia    Male hypogonadism    Moderate persistent asthma    Obesity, Class I, BMI 30-34.9    Papillary thyroid carcinoma (HCC) 2010   total thyroidectomy + neck dissection   Postsurgical hypothyroidism    Prediabetes    Pulmonary fibrosis (HCC)    ?rad induced?  Past Surgical History:  Procedure Laterality Date   COLONOSCOPY  07/04/2019   done for hematochezia and FH col ca-mother: Mod int hem w/inflamm.  No adenomatous polyps.   INCISIONAL HERNIA REPAIR  06/2019   RUQ (prev lap append site)   LAPAROSCOPIC APPENDECTOMY N/A 03/25/2017   Procedure: APPENDECTOMY LAPAROSCOPIC; EXTENSIVE LYSIS OF ADHESIONS FROM PRIOR RADIATION AND SURGERY;  Surgeon: Luretha Murphy, MD;  Location: [facility];  Service: General;  Laterality: N/A;   MENISCUS REPAIR Left 09/16/2023   Procedure: REPAIR OF LATERAL MENISCUS;  Surgeon:  Yolonda Kida, MD;  Location: [facility];  Service: Orthopedics;  Laterality: Left;  120 block in preop   PARATHYROIDECTOMY     SPLENECTOMY     TEE WITHOUT CARDIOVERSION N/A 07/18/2015   Procedure: TRANSESOPHAGEAL ECHOCARDIOGRAM (TEE);  Surgeon: [name], MD;  Location: [facility];  Service: Cardiovascular;  Laterality: N/A;   THYROIDECTOMY      Family Psychiatric History: Reports family history of mental illness.   Family History:  Family History  Problem Relation Age of Onset   Breast cancer Mother        dx early 32s   Colon cancer Mother        dx early 46s   Other Mother        NTHL1 homozygous   Sleep apnea Mother    Parkinsonism Father    Multiple myeloma Father        dx 103s   Sleep apnea Father    Breast cancer Maternal Aunt    Stroke Maternal Grandmother    Leukemia Other        dx <20; two paternal first cousins once removed    Social History:  Social History   Socioeconomic History   Marital status: Single    Spouse name: Not on file   Number of children: 0   Years of education: Not on file   Highest education level: Not on file  Occupational History   Not on file  Tobacco Use   Smoking status: Former    Current packs/day: 0.00    Types: Cigarettes    Quit date: 2013    Years since quitting: 12.0   Smokeless tobacco: Never  Vaping Use   Vaping status: Never Used  Substance and Sexual Activity   Alcohol use: Not Currently    Comment: occasionally   Drug use: Yes    Types: Marijuana    Comment: pt states "I'm uncomfortable answering this question"   Sexual activity: Yes  Other Topics Concern   Not on file  Social History Narrative   Are you right handed or left handed? Right   Are you currently employed ? No   What is your current occupation? None   Do you live at home alone? yes   Who lives with you? no   What type of home do you live in: 1 story or 2 story? Third floor apratment      Social Drivers of  Health   Financial Resource Strain: Not on file  Food Insecurity: Low Risk  (10/24/2023)   Received from [facility]   Hunger Vital Sign    Worried About Running Out of Food in the Last Year: Never true    Ran Out of Food in the Last Year: Never true  Transportation Needs: No Transportation Needs (10/24/2023)   Received from [facility]    In the past 12 months, has lack of reliable transportation kept  you from medical appointments, meetings, work or from getting things needed for daily living? : No  Physical Activity: Not on file  Stress: Not on file  Social Connections: Not on file    Allergies:  Allergies  Allergen Reactions   Ciprofloxacin Other (See Comments)    Spontaneous rupture of tendon   Earnestine Leys Officinalis] Anaphylaxis and Hives   Fluocinolone Other (See Comments)    "spontaneous rupture of tendons" per pt dr.   Dapagliflozin Nausea And Vomiting    abscess  Patient refuses to give further information.   Fluocinolone Acetonide Other (See Comments)   Prednisone Rash    Metabolic Disorder Labs: Lab Results  Component Value Date   HGBA1C 6.1 09/30/2021   MPG 114 07/17/2015   No results found for: "PROLACTIN" Lab Results  Component Value Date   CHOL 139 09/12/2023   TRIG 87 09/12/2023   HDL 39 (L) 09/12/2023   CHOLHDL 3.6 09/12/2023   VLDL 16.1 05/05/2021   LDLCALC 83 09/12/2023   LDLCALC 141 (H) 07/08/2023   Lab Results  Component Value Date   TSH 3.84 09/15/2022   TSH 6.798 (H) 07/17/2015    Therapeutic Level Labs: No results found for: "LITHIUM" No results found for: "VALPROATE" No results found for: "CBMZ"  Current Medications: Current Outpatient Medications  Medication Sig Dispense Refill   risperiDONE (RISPERDAL) 0.5 MG tablet Take 1 tablet (0.5 mg total) by mouth at bedtime. 30 tablet 2   albuterol (PROVENTIL) (2.5 MG/3ML) 0.083% nebulizer solution Take 3 mLs (2.5 mg total) by nebulization every 4 (four) hours as  needed for wheezing or shortness of breath. 75 mL 1   ammonium lactate (LAC-HYDRIN) 12 % lotion Apply 1 application topically as needed for dry skin.     aspirin 325 MG tablet Take 1 tablet (325 mg total) by mouth daily. 30 tablet 0   Azelastine HCl 137 MCG/SPRAY SOLN USE 1 SPRAY(S) IN EACH NOSTRIL ONCE DAILY AS DIRECTED 30 mL 5   baclofen (LIORESAL) 10 MG tablet Take 1 tablet by mouth three times daily as needed 30 tablet 5   bisoprolol (ZEBETA) 10 MG tablet Take 0.5 tablets (5 mg total) by mouth at bedtime. (Patient taking differently: Take 5 mg by mouth at bedtime. 5 mg)     budesonide (PULMICORT) 0.5 MG/2ML nebulizer solution Mix with Xopenex nebulizer solution every 6 hours as needed. 120 mL 2   clindamycin (CLEOCIN T) 1 % lotion Apply topically as needed (rash).     diphenhydrAMINE HCl (BENADRYL PO) Take by mouth.     EPINEPHrine 0.3 mg/0.3 mL IJ SOAJ injection Inject 0.3 mg into the muscle as needed for anaphylaxis. 2 each 2   esomeprazole (NEXIUM) 40 MG capsule TAKE 1 CAPSULE BY MOUTH TWICE DAILY BEFORE A MEAL 180 capsule 3   Evolocumab (REPATHA SURECLICK) 140 MG/ML SOAJ Inject 140 mg into the skin every 14 (fourteen) days. 6 mL 3   fluticasone (FLONASE) 50 MCG/ACT nasal spray Use 1 spray(s) in each nostril once daily 16 g 11   Fluticasone-Umeclidin-Vilant (TRELEGY ELLIPTA) 200-62.5-25 MCG/ACT AEPB INHALE 1 PUFF INTO LUNGS ONCE DAILY 60 each 5   furosemide (LASIX) 20 MG tablet Take 1 tablet (20 mg total) by mouth 3 (three) times a week. 36 tablet 3   gabapentin (NEURONTIN) 300 MG capsule TAKE 2 CAPSULES BY MOUTH IN THE MORNING AND 2 CAPSULES AT NOON AND 4 CAPSULES AT BEDTIME 240 capsule 3   imiquimod (ALDARA) 5 % cream Apply topically at bedtime.  Apply to affected areas of hands at bedtime 24 each 1   levalbuterol (XOPENEX HFA) 45 MCG/ACT inhaler INHALE 2 PUFFS BY MOUTH EVERY 4 HOURS AS NEEDED FOR WHEEZING 30 g 0   levalbuterol (XOPENEX) 1.25 MG/3ML nebulizer solution Take 1.25 mg by  nebulization every 4 (four) hours as needed for wheezing. 120 mL 1   levothyroxine (SYNTHROID) 300 MCG tablet Take 300 mcg by mouth daily before breakfast.     LUER LOCK SAFETY SYRINGES 22G X 1-1/2" 3 ML MISC USE FOR SELF ADMINISTER OF TESTOSTERONE 2CC ONCE EVERY 2 WEEKS     Melatonin 10 MG TABS Take by mouth at bedtime.     Multiple Vitamin (MULTI-VITAMIN) tablet Take 1 tablet by mouth daily.     mupirocin ointment (BACTROBAN) 2 % Apply 1 Application topically daily. Covered under a bandage 22 g 0   rosuvastatin (CRESTOR) 40 MG tablet Take 1 tablet by mouth once daily 90 tablet 3   sacubitril-valsartan (ENTRESTO) 49-51 MG Take 1 tablet by mouth 2 (two) times daily. 180 tablet 3   Suvorexant (BELSOMRA) 10 MG TABS TAKE 1 TABLET BY MOUTH AT BEDTIME 30 tablet 5   tamsulosin (FLOMAX) 0.4 MG CAPS capsule Take 1 capsule by mouth once daily 30 capsule 2   testosterone cypionate (DEPOTESTOSTERONE CYPIONATE) 200 MG/ML injection Inject 0.6 mLs (120 mg total) into the muscle once a week. 10 mL 0   traZODone (DESYREL) 150 MG tablet TAKE 2 TABLETS BY MOUTH AT BEDTIME 180 tablet 3   No current facility-administered medications for this visit.    Medication Side Effects: none  Orders placed this visit:  No orders of the defined types were placed in this encounter.   Psychiatric Specialty Exam:  Review of Systems  Musculoskeletal:  Negative for gait problem.  Neurological:  Negative for tremors.  Psychiatric/Behavioral:         Please refer to HPI    Blood pressure (!) 140/85, pulse 87, height 6' (1.829 m), weight 198 lb (89.8 kg).Body mass index is 26.85 kg/m.  General Appearance: Casual and Neat  Eye Contact:  Good  Speech:  Clear and Coherent and Normal Rate  Volume:  Normal  Mood:  Anxious, Depressed, and Irritable  Affect:  Appropriate and Congruent  Thought Process:  Coherent and Descriptions of Associations: Intact  Orientation:  Full (Time, Place, and Person)  Thought Content: Logical    Suicidal Thoughts:  No  Homicidal Thoughts:  No  Memory:  WNL  Judgement:  Good  Insight:  Good  Psychomotor Activity:  Normal  Concentration:  Concentration: Good and Attention Span: Good  Recall:  Good  Fund of Knowledge: Good  Language: Good  Assets:  Communication Skills Desire for Improvement Financial Resources/Insurance Housing Intimacy Leisure Time Physical Health Resilience Social Support Talents/Skills Transportation Vocational/Educational  ADL's:  Intact  Cognition: WNL  Prognosis:  Good   Screenings:  Oceanographer Row Office Visit from 08/18/2023 in [facility] [employer] Visit from 04/05/2023 in [facility] [employer]  PHQ-2 Total Score 0 0      Flowsheet Row ED from 10/17/2023 in [facility] Department at [facility] Admission (Discharged) from 09/16/2023 in [facility] ED from 08/15/2023 in [facility] Emergency Department at [facility]  C-SSRS RISK CATEGORY No Risk No Risk No Risk       Receiving Psychotherapy: Yes   Treatment Plan/Recommendations:   Plan:  PDMP reviewed  Add  Risperdal 0.5mg  at bedtime  RTC 4 weeks  60 minutes spent dedicated to the care of this patient on the date of this encounter to include pre-visit review of records, ordering of medication, post visit documentation, and face-to-face time with the patient discussing BPD, BPD2, PTSD, antisocial personality disorder, narcisscitic PD. Discussed adding Risperdal 0.5mg  at bedtime.   Patient advised to contact office with any questions, adverse effects, or acute worsening in signs and symptoms.   Dorothyann Gibbs, NP

## 2023-11-15 ENCOUNTER — Encounter: Payer: Self-pay | Admitting: Allergy & Immunology

## 2023-11-15 ENCOUNTER — Ambulatory Visit (INDEPENDENT_AMBULATORY_CARE_PROVIDER_SITE_OTHER): Payer: 59 | Admitting: Allergy & Immunology

## 2023-11-15 VITALS — BP 110/72 | HR 87 | Temp 99.1°F | Resp 18

## 2023-11-15 DIAGNOSIS — Y9389 Activity, other specified: Secondary | ICD-10-CM | POA: Diagnosis not present

## 2023-11-15 DIAGNOSIS — T782XXD Anaphylactic shock, unspecified, subsequent encounter: Secondary | ICD-10-CM | POA: Diagnosis not present

## 2023-11-15 DIAGNOSIS — J302 Other seasonal allergic rhinitis: Secondary | ICD-10-CM | POA: Diagnosis not present

## 2023-11-15 DIAGNOSIS — J4550 Severe persistent asthma, uncomplicated: Secondary | ICD-10-CM | POA: Diagnosis not present

## 2023-11-15 DIAGNOSIS — J3089 Other allergic rhinitis: Secondary | ICD-10-CM | POA: Diagnosis not present

## 2023-11-15 MED ORDER — AMOXICILLIN-POT CLAVULANATE 875-125 MG PO TABS
1.0000 | ORAL_TABLET | Freq: Two times a day (BID) | ORAL | 0 refills | Status: AC
Start: 2023-11-15 — End: 2023-11-25

## 2023-11-15 NOTE — Patient Instructions (Addendum)
 1. Moderate persistent asthma, uncomplicated - Lung testing not done today since you were feeling terrible.  - You definitely sound junky today. - Call us  if you nedd steroids in the future.  - Let's try Airssupra two puffs at least twice daily and then every 4 hours as needed.  - I think that it is ok to use the neb treatment twice daily. - Daily controller medication(s): Trelegy 200/62.5/25 one puff once daily + Pulmicort  (budesonide )/levalbuterol  nebulizer twice daily  - Prior to physical activity: Xopenex  2 puffs 10-15 minutes before physical activity. - Rescue medications: Xopenex  (levalbuterol ) 4 puffs every 4-6 hours as needed  - Asthma control goals:  * Full participation in all desired activities (may need albuterol  before activity) * Albuterol  use two time or less a week on average (not counting use with activity) * Cough interfering with sleep two time or less a month * Oral steroids no more than once a year * No hospitalizations  2. Acute sinusitis - Start Augmentin  twice daily twice for ten days. - We can always do a prednisone  burst if needed.  3. Chronic rhinitis (dust mites, cat, dog, grasses, molds, trees, and ragweed) - Continue with the Claritin (loratadine) 10mg  daily.  - Continue with Allegra (fexofenadine) 10mg  daily.  - Continue with Allercort one spray per nostril daily. - Continue with Astelin  one spray per nostril daily.  4. 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  - We will continue to monitor.    5. Return in about 6 months (around 05/14/2024). You can have the follow up appointment with Dr. Iva or a Nurse Practicioner (our Nurse Practitioners are excellent and always have Physician oversight!).    Please inform us  of any Emergency Department visits, hospitalizations, or changes in symptoms. Call us  before going to the ED for breathing or allergy symptoms since we might be able to fit you in for a sick visit. Feel free to contact  us  anytime with any questions, problems, or concerns.  It was a pleasure to see you again today!  Websites that have reliable patient information: 1. American Academy of Asthma, Allergy, and Immunology: www.aaaai.org 2. Food Allergy Research and Education (FARE): foodallergy.org 3. Mothers of Asthmatics: http://www.asthmacommunitynetwork.org 4. American College of Allergy, Asthma, and Immunology: www.acaai.org      "Like" us  on Facebook and Instagram for our latest updates!      A healthy democracy works best when [employer] participate! Make sure you are registered to vote! If you have moved or changed any of your contact information, you will need to get this updated before voting! Scan the QR codes below to learn more!

## 2023-11-15 NOTE — Progress Notes (Signed)
 FOLLOW UP  Date of Service/Encounter:  11/15/23   Assessment:   Moderate persistent asthma - much better on Trelegy in combination with budeonisde + albuterol  BID  Acute sinusitis - starting Augmentin  today, but he is going to call with an update by the end of the week   Heart failure with reduced ejection fraction - on Lasix  and two antihypertensives    Elevated AEC in the past (600 in March 2020) - previously on Nucala , although he did not feel that this provided much relief, although prednisone  use seems to have increased since stopping the Nucala    Perennial and seasonal allergic rhinitis (dust mites, cat, dog, grasses, molds, trees, and ragweed)   Hypercholesterolemia - planning to start Repatha  this week   Complicated past medical history, including Hodgkin's lymphoma at age 89 and subsequent malignancies since that time    Plan/Recommendations:   1. Moderate persistent asthma, uncomplicated - Lung testing not done today since you were feeling terrible.  - You definitely sound junky today. - Call us  if you nedd steroids in the future.  - Let's try Airssupra two puffs at least twice daily and then every 4 hours as needed.  - I think that it is ok to use the neb treatment twice daily. - Daily controller medication(s): Trelegy 200/62.5/25 one puff once daily + Pulmicort  (budesonide )/levalbuterol  nebulizer twice daily  - Prior to physical activity: Xopenex  2 puffs 10-15 minutes before physical activity. - Rescue medications: Xopenex  (levalbuterol ) 4 puffs every 4-6 hours as needed  - Asthma control goals:  * Full participation in all desired activities (may need albuterol  before activity) * Albuterol  use two time or less a week on average (not counting use with activity) * Cough interfering with sleep two time or less a month * Oral steroids no more than once a year * No hospitalizations  2. Acute sinusitis - Start Augmentin  twice daily twice for ten days. - We can  always do a prednisone  burst if needed.  3. Chronic rhinitis (dust mites, cat, dog, grasses, molds, trees, and ragweed) - Continue with the Claritin (loratadine) 10mg  daily.  - Continue with Allegra (fexofenadine) 10mg  daily.  - Continue with Allercort one spray per nostril daily. - Continue with Astelin  one spray per nostril daily.  4. 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  - We will continue to monitor.    5. Return in about 6 months (around 05/14/2024). You can have the follow up appointment with Dr. Iva or a Nurse Practicioner (our Nurse Practitioners are excellent and always have Physician oversight!).   Subjective:   REICE BIENVENUE is a 48 y.o. male presenting today for follow up of  Chief Complaint  Patient presents with   Asthma    SELVIN YUN has a history of the following: Patient Active Problem List   Diagnosis Date Noted   Rupture of anterior cruciate ligament of both knees 08/18/2023   Scrotal abscess 08/06/2023   Polycythemia, secondary 04/22/2023   Moderate persistent asthma with exacerbation 03/29/2023   SVT (supraventricular tachycardia) (HCC) 12/30/2022   Aortic atherosclerosis (HCC) 12/30/2022   HFrEF (heart failure with reduced ejection fraction) (HCC) 12/30/2022   Blood pressure alteration 12/30/2022   Erythrocytosis 11/13/2022   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   Lower urinary tract symptoms (LUTS) 06/02/2022   Rotator cuff tear, right 03/03/2022   History of squamous cell carcinoma in situ (SCCIS) of skin 02/22/2022   Osteoarthritis of acromioclavicular joint 10/15/2021   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   History of splenectomy 02/02/2021   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   Erectile dysfunction 02/02/2021   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   Pulmonary fibrosis (HCC) 02/02/2021   Peripheral neuropathy 02/02/2021   History of hepatitis C 02/02/2021   Back pain  02/02/2021   Hyperlipidemia 02/02/2021   Moderate persistent asthma, uncomplicated 08/13/2020   History of Hodgkin's lymphoma 08/13/2020   Chronic rhinitis 08/13/2020   Not currently working due to disabled status 08/13/2020   Pain in joint of left knee 07/25/2020   History of thyroid  cancer 06/09/2020   Arthralgia of left elbow 05/01/2020   Autonomic dysfunction 04/23/2020   Iron deficiency anemia due to chronic blood loss 01/18/2020   Microalbuminuria 01/18/2020   Chronic insomnia 01/17/2020   Large liver 01/17/2020   Prediabetes 01/11/2019   Daytime somnolence 07/22/2017   History of basal cell carcinoma (BCC) 11/30/2016   GERD (gastroesophageal reflux disease) 08/25/2016   Essential hypertension 08/25/2016   Substance induced mood disorder (HCC)    History of stroke     History obtained from: chart review and patient. He is here with his girlfriend, Rosina.  Discussed the use of AI scribe software for clinical note transcription with the patient and/or guardian, who gave verbal consent to proceed.  Antoino is a 48 y.o. male presenting for a follow up visit. He was last seen in August 2024.  At that time, lung testing looked much better.  We continue to hold on his biologic treatment.  He remained on Trelegy 200 mcg 1 puff daily and Pulmicort  and Xopenex  mixed together twice a day.  For his rhinitis, he continue with Claritin as well as Allegra and his nasal sprays.  EpiPen  was up-to-date for his history of exercise-induced anaphylaxis.  Since the last visit, he has largely done well.   Asthma/Respiratory Symptom History: He has been experiencing increased difficulty breathing and coughing over the past few weeks, particularly after smoking cannabis. He describes producing dark phlegm and needing to perform a 'push cough' to clear it. He notes feeling warmer today, although he does not regularly monitor his temperature. He has a history of prolonged respiratory illnesses, having  been sick for weeks in the past, and was last on antibiotics in October. The current illness has lasted about three weeks, with symptoms including worsened coughing and changes in phlegm appearance.  He has been on Trelegy for years and is compliant with its use, noting that missing doses leads to noticeable breathing difficulties. He also uses a nebulizer with a budesonide  and levalbuterol  combination, which he finds helpful, although he stopped using it a month ago as he felt better. He was using it three times a day when sick but has not needed it recently.  Allergic Rhinitis Symptom History: He remains on the Claritin as well as the Allegra, Ala-Cort , and Astelin .  This seems to be working well.  Skin Symptom History: He reports a skin breakout, which he associates with being sick. He believes an antibiotic may be necessary as he has experienced similar symptoms in the past when ill.  He has a history of knee surgery following an ACL injury from a powerlifting accident. 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 He has had surgery on one knee and is scheduled for another surgery in two weeks.  This has interrupted his workout schedule. He is a equities trader and has a history of smoking cannabis. He has been less  frequent in visiting his parents due to his injury and their dog's rambunctious behavior.   Otherwise, there have been no changes to his past medical history, surgical history, family history, or social history.    Review of systems otherwise negative other than that mentioned in the HPI.    Objective:   Blood pressure 110/72, pulse 87, temperature 99.1 F (37.3 C), temperature source Temporal, resp. rate 18, SpO2 95%. There is no height or weight on file to calculate BMI.    Physical Exam Vitals reviewed.  Constitutional:      Appearance: He is well-developed.     Comments: Pleasant very talkative male.  HENT:     Head:  Normocephalic and atraumatic.     Right Ear: Tympanic membrane, ear canal and external ear normal.     Left Ear: Tympanic membrane, ear canal and external ear normal.     Nose: No nasal deformity, septal deviation, mucosal edema or rhinorrhea.     Right Turbinates: Enlarged, swollen and pale.     Left Turbinates: Enlarged, swollen and pale.     Right Sinus: No maxillary sinus tenderness or frontal sinus tenderness.     Left Sinus: No maxillary sinus tenderness or frontal sinus tenderness.     Comments: No nasal polyps.    Mouth/Throat:     Lips: Pink.     Mouth: Mucous membranes are moist. Mucous membranes are not pale and not dry. Oral lesions present.     Pharynx: Uvula midline.     Comments: He does have some white linear lesions on the bilateral sides of his tongue. Eyes:     General: Lids are normal. No allergic shiner.       Right eye: No discharge.        Left eye: No discharge.     Conjunctiva/sclera: Conjunctivae normal.     Right eye: Right conjunctiva is not injected. No chemosis.    Left eye: Left conjunctiva is not injected. No chemosis.    Pupils: Pupils are equal, round, and reactive to light.  Cardiovascular:     Rate and Rhythm: Normal rate and regular rhythm.     Heart sounds: Normal heart sounds.  Pulmonary:     Effort: Pulmonary effort is normal. No tachypnea, accessory muscle usage or respiratory distress.     Breath sounds: Decreased air movement and transmitted upper airway sounds present. Examination of the right-upper field reveals rhonchi. Examination of the left-upper field reveals rhonchi. Examination of the right-middle field reveals rhonchi. Examination of the left-middle field reveals rhonchi. Rhonchi present. No wheezing or rales.     Comments: Coarse upper airway sounds throughout.  He does have referred upper airway sounds. Chest:     Chest wall: No tenderness.  Lymphadenopathy:     Cervical: No cervical adenopathy.  Skin:    Coloration: Skin is not  pale.     Findings: No abrasion, erythema, petechiae or rash. Rash is not papular, urticarial or vesicular.  Neurological:     Mental Status: He is alert.  Psychiatric:        Behavior: Behavior is cooperative.      Diagnostic studies:  deferred since he was sick today      Marty Shaggy, MD  [facility] 

## 2023-11-16 DIAGNOSIS — G4733 Obstructive sleep apnea (adult) (pediatric): Secondary | ICD-10-CM | POA: Diagnosis not present

## 2023-11-16 DIAGNOSIS — M25562 Pain in left knee: Secondary | ICD-10-CM | POA: Diagnosis not present

## 2023-11-18 DIAGNOSIS — M25562 Pain in left knee: Secondary | ICD-10-CM | POA: Diagnosis not present

## 2023-11-23 DIAGNOSIS — M25562 Pain in left knee: Secondary | ICD-10-CM | POA: Diagnosis not present

## 2023-11-24 DIAGNOSIS — G4733 Obstructive sleep apnea (adult) (pediatric): Secondary | ICD-10-CM | POA: Diagnosis not present

## 2023-11-25 ENCOUNTER — Encounter (HOSPITAL_BASED_OUTPATIENT_CLINIC_OR_DEPARTMENT_OTHER): Payer: Self-pay | Admitting: Orthopedic Surgery

## 2023-11-26 DIAGNOSIS — G4733 Obstructive sleep apnea (adult) (pediatric): Secondary | ICD-10-CM | POA: Diagnosis not present

## 2023-11-30 ENCOUNTER — Encounter (HOSPITAL_BASED_OUTPATIENT_CLINIC_OR_DEPARTMENT_OTHER): Payer: Self-pay | Admitting: Orthopedic Surgery

## 2023-11-30 DIAGNOSIS — M25562 Pain in left knee: Secondary | ICD-10-CM | POA: Diagnosis not present

## 2023-11-30 NOTE — Progress Notes (Signed)
Reviewed chart with Dr Bradley Ferris (anesthesia) who said pt doesn't meet criteria for surgery at [facility] yet. He must be at least 2 weeks after resolution of pulmonary symptoms; or 4 weeks post onset of symptoms. Surgery will need to be postponed until criteria met. Spoke with Dr Aundria Rud' scheduler Lyla Son) and made them aware. Texted patient to tell him not to come for labs today.

## 2023-11-30 NOTE — Progress Notes (Signed)
Pt called facility asking to speak with the supervisor. I answered the phone having already been made aware of the situation at hand and that the patient was very upset about the fact that his surgery would need to be delayed until at least March 4th due to recent URI. I tried to explain to him that this is the facilities policy for ALL patients and that this was a decision made by the anesthesiologists team. His repeated response to this was "WHY ARE YOU DOING THIS TO ME?" IT'S NOT FAIR!" Pt continued to yell and cut me off while trying to explain the safety reasons for why we need to reschedule. I eventually asked him if there was anything else I could help explain for him otherwise Dr.Rogers office would be in contact to have him rescheduled and pt hung up the phone.

## 2023-11-30 NOTE — Progress Notes (Signed)
Pt was challenging on surgery preadm call. Didn't want to answer questions, "don't have time for this," he did assure me he is feeling better after recent antibiotic course and that he is 100% back to his baseline. Was unable to go through meds and other questions as thoroughly as needed. Reviewed preop instructions, time, meds to take DOS, and labs needed (pt coming today.) He also reports "I don't want anyone talking to me about dying" (being a risk with surgery), "they did that last time and it makes me anxious, it is not acceptable for anyone to talk to a patient about dying when they've come for surgery."

## 2023-11-30 NOTE — Progress Notes (Signed)
   11/30/23 1009  Pre-op Phone Call  Surgery Date Verified 12/02/23  Arrival Time Verified 0945  Surgery Location Verified [facility] [place]  Is the patient taking a GLP-1 receptor agonist? No  Do you have a history of heart problems? No  Med Rec Completed Yes  Take the Following Meds the Morning of Surgery pantaprazole, synthroid, entresto DOS with sip water; pt has already stopped ASA on his own accord  Recent  Lab Work, EKG, CXR? No  NPO (Including gum & candy) After midnight  Patient instructed to stop clear liquids including Carb loading drink at: 0800  Stop Solids, Milk, Candy, and Gum STARTING AT MIDNIGHT  Responsible adult to drive and be with you for 24 hours? Yes  Name & Phone Number for Ride/Caregiver mom will provide transportation, girlfriend will provide 24 hr care  No Jewelry, money, nail polish or make-up.  No lotions, powders, perfumes. No shaving  48 hrs. prior to surgery. Yes  Contacts, Dentures & Glasses Will Have to be Removed Before OR. Yes  Please bring your ID and Insurance Card the morning of your surgery. (Surgery Centers Only) Yes  Bring any papers or x-rays with you that your surgeon gave you. Yes  Covid-19 Assessment  Have you had a positive COVID-19 test within the previous 90 days? No  COVID Testing Guidance Proceed with the additional questions.  Patient's surgery required a COVID-19 test (cardiothoracic, complex ENT, and bronchoscopies/ EBUS) No  Have you been unmasked and in close contact with anyone with COVID-19 or COVID-19 symptoms within the past 10 days? No  Do you or anyone in your household currently have any COVID-19 symptoms? No

## 2023-12-05 ENCOUNTER — Other Ambulatory Visit: Payer: Self-pay | Admitting: Internal Medicine

## 2023-12-05 DIAGNOSIS — M25562 Pain in left knee: Secondary | ICD-10-CM | POA: Diagnosis not present

## 2023-12-09 ENCOUNTER — Encounter (HOSPITAL_COMMUNITY): Payer: Self-pay

## 2023-12-09 NOTE — Patient Instructions (Addendum)
 SURGICAL WAITING ROOM VISITATION  Patients having surgery or a procedure may have no more than 2 support people in the waiting area - these visitors may rotate.    Children under the age of 38 must have an adult with them who is not the patient.  Due to an increase in RSV and influenza rates and associated hospitalizations, children ages 54 and under may not visit patients in [facility] hospitals.  Visitors with respiratory illnesses are discouraged from visiting and should remain at home.  If the patient needs to stay at the hospital during part of their recovery, the visitor guidelines for inpatient rooms apply. Pre-op nurse will coordinate an appropriate time for 1 support person to accompany patient in pre-op.  This support person may not rotate.    Please refer to the [facility] website for the visitor guidelines for Inpatients (after your surgery is over and you are in a regular room).       Your procedure is scheduled on: 12-23-23   Report to [facility] Main Entrance    Report to admitting at       0515 AM   Call this number if you have problems the morning of surgery [phone]   Do not eat food :After Midnight.   After Midnight you may have the following liquids until _0415 _____ AM/ DAY OF SURGERY  then nothing by mouth  Water Non-Citrus Juices (without pulp, NO RED-Apple, White grape, White cranberry) Black Coffee (NO MILK/CREAM OR CREAMERS, sugar ok)  Clear Tea (NO MILK/CREAM OR CREAMERS, sugar ok) regular and decaf                             Plain Jell-O (NO RED)                                           Fruit ices (not with fruit pulp, NO RED)                                     Popsicles (NO RED)                                                               Sports drinks like Gatorade (NO RED)                    The day of surgery:  Drink ONE (1) Pre-Surgery  G2 BY 0415 AM the morning of surgery. Drink in one sitting. Do not sip.  This drink was  given to you during your hospital  pre-op appointment visit. Nothing else to drink after completing the  Pre-Surgery  G2.          If you have questions, please contact your surgeon's office.   FOLLOW ANY ADDITIONAL PRE OP INSTRUCTIONS YOU RECEIVED FROM YOUR SURGEON'S OFFICE!!!     Oral Hygiene is also important to reduce your risk of infection.  Remember - BRUSH YOUR TEETH THE MORNING OF SURGERY WITH YOUR REGULAR TOOTHPASTE  DENTURES WILL BE REMOVED PRIOR TO SURGERY PLEASE DO NOT APPLY "Poly grip" OR ADHESIVES!!!   Do NOT smoke after Midnight   Stop all vitamins and herbal supplements 7 days before surgery.   Take these medicines the morning of surgery with A SIP OF WATER: Tamsulosin,  levothyroxine, nebulizer if needed, inhaler and bring rescue inhaler with you, gabapentin,  nexium    Bring Cpap mask and tubing                              You may not have any metal on your body including hair pins, jewelry, and body piercing             Do not wear lotions, powders, /cologne, or deodorant               Men may shave face and neck.   Do not bring valuables to the hospital. [facility] IS NOT             RESPONSIBLE   FOR VALUABLES.   Contacts, glasses, dentures or bridgework may not be worn into surgery.   Bring small overnight bag day of surgery.   DO NOT BRING YOUR HOME MEDICATIONS TO THE HOSPITAL. PHARMACY WILL DISPENSE MEDICATIONS LISTED ON YOUR MEDICATION LIST TO YOU DURING YOUR ADMISSION IN THE HOSPITAL!    Patients discharged on the day of surgery will not be allowed to drive home.  Someone NEEDS to stay with you for the first 24 hours after anesthesia.   Special Instructions: Bring a copy of your healthcare power of attorney and living will documents the day of surgery if you haven't scanned them before.              Please read over the following fact sheets you were given: IF YOU HAVE QUESTIONS ABOUT YOUR PRE-OP INSTRUCTIONS  PLEASE CALL [phone]    If you test positive for Covid or have been in contact with anyone that has tested positive in the last 10 days please notify you surgeon.    [place] - Preparing for Surgery Before surgery, you can play an important role.  Because skin is not sterile, your skin needs to be as free of germs as possible.  You can reduce the number of germs on your skin by washing with CHG (chlorahexidine gluconate) soap before surgery.  CHG is an antiseptic cleaner which kills germs and bonds with the skin to continue killing germs even after washing. Please DO NOT use if you have an allergy to CHG or antibacterial soaps.  If your skin becomes reddened/irritated stop using the CHG and inform your nurse when you arrive at Short Stay. Do not shave (including legs and underarms) for at least 48 hours prior to the first CHG shower.  You may shave your face/neck. Please follow these instructions carefully:  1.  Shower with CHG Soap the night before surgery and the  morning of Surgery.  2.  If you choose to wash your hair, wash your hair first as usual with your  normal  shampoo.  3.  After you shampoo, rinse your hair and body thoroughly to remove the  shampoo.                           4.  Use CHG as you would  any other liquid soap.  You can apply chg directly  to the skin and wash                       Gently with a scrungie or clean washcloth.  5.  Apply the CHG Soap to your body ONLY FROM THE NECK DOWN.   Do not use on face/ open                           Wound or open sores. Avoid contact with eyes, ears mouth and genitals (private parts).                       Wash face,  Genitals (private parts) with your normal soap.             6.  Wash thoroughly, paying special attention to the area where your surgery  will be performed.  7.  Thoroughly rinse your body with warm water from the neck down.  8.  DO NOT shower/wash with your normal soap after using and rinsing off  the CHG Soap.                 9.  Pat yourself dry with a clean towel.            10.  Wear clean pajamas.            11.  Place clean sheets on your bed the night of your first shower and do not  sleep with pets. Day of Surgery : Do not apply any lotions/deodorants the morning of surgery.  Please wear clean clothes to the hospital/surgery center.  FAILURE TO FOLLOW THESE INSTRUCTIONS MAY RESULT IN THE CANCELLATION OF YOUR SURGERY PATIENT SIGNATURE_________________________________  NURSE SIGNATURE__________________________________  ________________________________________________________________________  Rogelia Mire  An incentive spirometer is a tool that can help keep your lungs clear and active. This tool measures how well you are filling your lungs with each breath. Taking long deep breaths may help reverse or decrease the chance of developing breathing (pulmonary) problems (especially infection) following: A long period of time when you are unable to move or be active. BEFORE THE PROCEDURE  If the spirometer includes an indicator to show your best effort, your nurse or respiratory therapist will set it to a desired goal. If possible, sit up straight or lean slightly forward. Try not to slouch. Hold the incentive spirometer in an upright position. INSTRUCTIONS FOR USE  Sit on the edge of your bed if possible, or sit up as far as you can in bed or on a chair. Hold the incentive spirometer in an upright position. Breathe out normally. Place the mouthpiece in your mouth and seal your lips tightly around it. Breathe in slowly and as deeply as possible, raising the piston or the ball toward the top of the column. Hold your breath for 3-5 seconds or for as long as possible. Allow the piston or ball to fall to the bottom of the column. Remove the mouthpiece from your mouth and breathe out normally. Rest for a few seconds and repeat Steps 1 through 7 at least 10 times every 1-2 hours when you are  awake. Take your time and take a few normal breaths between deep breaths. The spirometer may include an indicator to show your best effort. Use the indicator as a goal to work toward during each repetition. After each set of 10  deep breaths, practice coughing to be sure your lungs are clear. If you have an incision (the cut made at the time of surgery), support your incision when coughing by placing a pillow or rolled up towels firmly against it. Once you are able to get out of bed, walk around indoors and cough well. You may stop using the incentive spirometer when instructed by your caregiver.  RISKS AND COMPLICATIONS Take your time so you do not get dizzy or light-headed. If you are in pain, you may need to take or ask for pain medication before doing incentive spirometry. It is harder to take a deep breath if you are having pain. AFTER USE Rest and breathe slowly and easily. It can be helpful to keep track of a log of your progress. Your caregiver can provide you with a simple table to help with this. If you are using the spirometer at home, follow these instructions: SEEK MEDICAL CARE IF:  You are having difficultly using the spirometer. You have trouble using the spirometer as often as instructed. Your pain medication is not giving enough relief while using the spirometer. You develop fever of 100.5 F (38.1 C) or higher. SEEK IMMEDIATE MEDICAL CARE IF:  You cough up bloody sputum that had not been present before. You develop fever of 102 F (38.9 C) or greater. You develop worsening pain at or near the incision site. MAKE SURE YOU:  Understand these instructions. Will watch your condition. Will get help right away if you are not doing well or get worse. Document Released: 02/07/2007 Document Revised: 12/20/2011 Document Reviewed: 04/10/2007 [facility] Patient Information 2014 [place], [place].   ________________________________________________________________________

## 2023-12-09 NOTE — Progress Notes (Addendum)
 PCP - Herbie Drape, MD  Cardiologist -    Riley Lam, MD   Clearance 09-05-23 Jari Favre, PA-C     PPM/ICD -  Device Orders -  Rep Notified -   Chest x-ray - 04-11-23 EKG - 09-14-23 Stress Test -  ECHO - 11-11-22 Cardiac Cath -   Sleep Study -  CPAP -   Fasting Blood Sugar -  Checks Blood Sugar _____ times a day  Blood Thinner Instructions: Aspirin Instructions: 81 mg hold 5-7 days  ERAS Protcol - PRE-SURGERY- ensure   COVID vaccine -yes  Activity--Able to climb a flight of stairs without CP or SOB  Anesthesia review: HTN asthma, OSA, Hep C,  stroke, Hodgkins Lymphoma, pulmonary fibrosis, CHF  Patient denies shortness of breath, fever, cough and chest pain at PAT appointment   All instructions explained to the patient, with a verbal understanding of the material. Patient agrees to go over the instructions while at home for a better understanding. Patient also instructed to self quarantine after being tested for COVID-19. The opportunity to ask questions was provided.

## 2023-12-10 ENCOUNTER — Other Ambulatory Visit: Payer: Self-pay | Admitting: Allergy & Immunology

## 2023-12-11 ENCOUNTER — Other Ambulatory Visit: Payer: Self-pay | Admitting: Family Medicine

## 2023-12-11 DIAGNOSIS — R399 Unspecified symptoms and signs involving the genitourinary system: Secondary | ICD-10-CM

## 2023-12-14 ENCOUNTER — Encounter (HOSPITAL_COMMUNITY): Payer: Self-pay

## 2023-12-14 ENCOUNTER — Other Ambulatory Visit: Payer: Self-pay

## 2023-12-14 ENCOUNTER — Encounter (HOSPITAL_COMMUNITY)
Admission: RE | Admit: 2023-12-14 | Discharge: 2023-12-14 | Disposition: A | Discharge status: Home / Self Care | Payer: 59 | Source: Ambulatory Visit | Attending: Orthopedic Surgery | Admitting: Orthopedic Surgery

## 2023-12-14 VITALS — BP 124/73 | HR 69 | Temp 98.0°F | Resp 16 | Ht 72.0 in | Wt 210.0 lb

## 2023-12-14 DIAGNOSIS — I509 Heart failure, unspecified: Secondary | ICD-10-CM | POA: Insufficient documentation

## 2023-12-14 DIAGNOSIS — R6889 Other general symptoms and signs: Secondary | ICD-10-CM | POA: Insufficient documentation

## 2023-12-14 DIAGNOSIS — S83511A Sprain of anterior cruciate ligament of right knee, initial encounter: Secondary | ICD-10-CM | POA: Diagnosis not present

## 2023-12-14 DIAGNOSIS — F319 Bipolar disorder, unspecified: Secondary | ICD-10-CM | POA: Diagnosis not present

## 2023-12-14 DIAGNOSIS — I110 Hypertensive heart disease with heart failure: Secondary | ICD-10-CM | POA: Insufficient documentation

## 2023-12-14 DIAGNOSIS — I351 Nonrheumatic aortic (valve) insufficiency: Secondary | ICD-10-CM | POA: Diagnosis not present

## 2023-12-14 DIAGNOSIS — Z8571 Personal history of Hodgkin lymphoma: Secondary | ICD-10-CM | POA: Insufficient documentation

## 2023-12-14 DIAGNOSIS — Z419 Encounter for procedure for purposes other than remedying health state, unspecified: Secondary | ICD-10-CM | POA: Insufficient documentation

## 2023-12-14 DIAGNOSIS — Z01812 Encounter for preprocedural laboratory examination: Secondary | ICD-10-CM | POA: Insufficient documentation

## 2023-12-14 DIAGNOSIS — I10 Essential (primary) hypertension: Secondary | ICD-10-CM | POA: Insufficient documentation

## 2023-12-14 DIAGNOSIS — S83281A Other tear of lateral meniscus, current injury, right knee, initial encounter: Secondary | ICD-10-CM | POA: Insufficient documentation

## 2023-12-14 DIAGNOSIS — Z87891 Personal history of nicotine dependence: Secondary | ICD-10-CM | POA: Diagnosis not present

## 2023-12-14 DIAGNOSIS — X58XXXA Exposure to other specified factors, initial encounter: Secondary | ICD-10-CM | POA: Diagnosis not present

## 2023-12-14 DIAGNOSIS — J4540 Moderate persistent asthma, uncomplicated: Secondary | ICD-10-CM | POA: Insufficient documentation

## 2023-12-14 DIAGNOSIS — G4730 Sleep apnea, unspecified: Secondary | ICD-10-CM | POA: Insufficient documentation

## 2023-12-14 HISTORY — DX: Unspecified osteoarthritis, unspecified site: M19.90

## 2023-12-14 HISTORY — DX: Pneumonia, unspecified organism: J18.9

## 2023-12-14 HISTORY — DX: Atrial premature depolarization: I49.1

## 2023-12-14 HISTORY — DX: Anxiety disorder, unspecified: F41.9

## 2023-12-14 HISTORY — DX: Malignant neoplasm of thyroid gland: C73

## 2023-12-14 HISTORY — DX: Heart failure, unspecified: I50.9

## 2023-12-14 HISTORY — DX: Anemia, unspecified: D64.9

## 2023-12-14 HISTORY — DX: Personal history of other diseases of the digestive system: Z87.19

## 2023-12-14 HISTORY — DX: Family history of other specified conditions: Z84.89

## 2023-12-14 HISTORY — DX: Hodgkin lymphoma, unspecified, unspecified site: C81.90

## 2023-12-14 HISTORY — DX: Depression, unspecified: F32.A

## 2023-12-14 LAB — CBC
HCT: 49.2 % (ref 39.0–52.0)
Hemoglobin: 16 g/dL (ref 13.0–17.0)
MCH: 33.5 pg (ref 26.0–34.0)
MCHC: 32.5 g/dL (ref 30.0–36.0)
MCV: 103.1 fL — ABNORMAL HIGH (ref 80.0–100.0)
Platelets: 210 10*3/uL (ref 150–400)
RBC: 4.77 MIL/uL (ref 4.22–5.81)
RDW: 17.2 % — ABNORMAL HIGH (ref 11.5–15.5)
WBC: 9.7 10*3/uL (ref 4.0–10.5)
nRBC: 0 % (ref 0.0–0.2)

## 2023-12-14 LAB — BASIC METABOLIC PANEL
Anion gap: 10 (ref 5–15)
BUN: 16 mg/dL (ref 6–20)
CO2: 22 mmol/L (ref 22–32)
Calcium: 8.3 mg/dL — ABNORMAL LOW (ref 8.9–10.3)
Chloride: 104 mmol/L (ref 98–111)
Creatinine, Ser: 0.92 mg/dL (ref 0.61–1.24)
GFR, Estimated: >60 mL/min (ref >60)
Glucose, Bld: 92 mg/dL (ref 70–99)
Potassium: 4.7 mmol/L (ref 3.5–5.1)
Sodium: 136 mmol/L (ref 135–145)

## 2023-12-15 DIAGNOSIS — M25562 Pain in left knee: Secondary | ICD-10-CM | POA: Diagnosis not present

## 2023-12-15 NOTE — Progress Notes (Signed)
 Anesthesia Chart Review   Case: [id number] Date/Time: 12/23/23 0700   Procedures:      KNEE ARTHROSCOPY WITH ANTERIOR CRUCIATE LIGAMENT (ACL) RECONSTRUCTION WITH ALLOGRAFT (Right)     KNEE ARTHROSCOPY WITH POSSIBLE LATERAL  MENISCAL REPAIR (Right: Knee)   Anesthesia type: Choice   Pre-op diagnosis: Right knee anterior cruciate ligament tear, lateral meniscus tear   Location: [facility] ROOM 08 / [facility]   Surgeons: Yolonda Kida, MD       DISCUSSION:48 y.o. former smoker with h/o Bipolar 1, HTN, sleep apnea, moderate persistent asthma, CHF, treated Hep C, Hodgkin's lymphoma, right knee ACL tear, lateral meniscus tear scheduled for above procedure 12/23/2023 with Dr. Marin Comment.   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   Per cardiology preoperative evaluation, "Troy [name]'s perioperative risk of a major cardiac event is 6.6% according to the Revised Cardiac Risk Index (RCRI).  Therefore, he is at high risk for perioperative complications.   His functional capacity is good at 6.36 METs according to the Duke Activity Status Index (DASI). Recommendations: According to ACC/AHA guidelines, no further cardiovascular testing needed.  The patient may proceed to surgery at acceptable risk.   Antiplatelet and/or Anticoagulation Recommendations: Aspirin can be held for 5-7 days prior to his surgery.  Please resume Aspirin post operatively when it is felt to be safe from a bleeding standpoint. "  VS: BP 124/73   Pulse 69   Temp 36.7 C (Oral)   Resp 16   Ht 6' (1.829 m)   Wt 95.3 kg   SpO2 98%   BMI 28.48 kg/m   PROVIDERS: Loyola Mast, MD is PCP    LABS: Labs reviewed: Acceptable for surgery. (all labs ordered are listed, but only abnormal results are displayed)  Labs Reviewed  CBC - Abnormal; Notable for the following components:      Result Value   MCV 103.1 (*)    RDW 17.2 (*)    All other components within normal limits  BASIC METABOLIC PANEL - Abnormal; Notable for the  following components:   Calcium 8.3 (*)    All other components within normal limits     IMAGES:   EKG:   CV: Echo 11/10/2022 1. Left ventricular ejection fraction by 3D volume is 45 %. The left  ventricle has mildly decreased function. The left ventricle demonstrates  global hypokinesis. The left ventricular internal cavity size was mildly  dilated. There is mild left  ventricular hypertrophy. Left ventricular diastolic parameters are  consistent with Grade I diastolic dysfunction (impaired relaxation). The  average left ventricular global longitudinal strain is -13.6 %. The global  longitudinal strain is abnormal.   2. Right ventricular systolic function is normal. The right ventricular  size is normal. There is normal pulmonary artery systolic pressure. The  estimated right ventricular systolic pressure is 28.0 mmHg.   3. The mitral valve is normal in structure. No evidence of mitral valve  regurgitation. No evidence of mitral stenosis.   4. The aortic valve is normal in structure. Aortic valve regurgitation is  mild. No aortic stenosis is present.   5. The inferior vena cava is normal in size with greater than 50%  respiratory variability, suggesting right atrial pressure of 3 mmHg.  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   CHF (congestive heart failure) (HCC)    Chronic fatigue    Depression    Drug abuse (HCC)    Ectopic atrial rhythm    Family history of adverse reaction to anesthesia    Dad  has problem with lots of medications   Family history of breast cancer 05/19/2021   Family history of colon cancer in mother    GERD (gastroesophageal reflux disease)    +Barretts   Hematochezia    hemorrhoids   Hepatic steatosis    +hepatomegaly   History of CVA (cerebrovascular accident)    radiation-induced  carotid damage/changes   History of hepatitis C    +treated   History of hiatal hernia    History of Hodgkin's lymphoma    age 60->mantle RT + splenectomy   History of splenectomy    Hodgkin's lymphoma (HCC)    Hyperlipidemia    Hypertension    Insomnia    Male hypogonadism    Moderate persistent asthma    Obesity, Class I, BMI 30-34.9    Papillary thyroid carcinoma (HCC) 2010   total thyroidectomy + neck dissection   Pneumonia    Postsurgical hypothyroidism    Prediabetes    Pulmonary fibrosis (HCC)    ?rad induced?   Sleep apnea    mild  cpap   Stroke [facility])    x2  2016  october no defecits memory affected   Thyroid ca [facility])     Past Surgical History:  Procedure Laterality Date   COLONOSCOPY  07/04/2019   done for hematochezia and FH col ca-mother: Mod int hem w/inflamm.  No adenomatous polyps.   INCISIONAL HERNIA REPAIR  06/2019   RUQ (prev lap append site)   LAPAROSCOPIC APPENDECTOMY N/A 03/25/2017   Procedure: APPENDECTOMY LAPAROSCOPIC; EXTENSIVE LYSIS OF ADHESIONS FROM PRIOR RADIATION AND SURGERY;  Surgeon: Luretha Murphy, MD;  Location: [facility];  Service: General;  Laterality: N/A;   MENISCUS REPAIR Left 09/16/2023   Procedure: REPAIR OF LATERAL MENISCUS;  Surgeon: Yolonda Kida, MD;  Location: [facility];  Service: Orthopedics;  Laterality: Left;  120 block in preop   PARATHYROIDECTOMY     SPLENECTOMY     TEE WITHOUT CARDIOVERSION N/A 07/18/2015   Procedure: TRANSESOPHAGEAL ECHOCARDIOGRAM (TEE);  Surgeon: [name], MD;  Location: [facility] ENDOSCOPY;  Service: Cardiovascular;  Laterality: N/A;   THYROIDECTOMY      MEDICATIONS:  ammonium lactate (LAC-HYDRIN) 12 % lotion   aspirin 325 MG tablet   Azelastine HCl 137 MCG/SPRAY SOLN   baclofen (LIORESAL) 10 MG tablet   bisoprolol (ZEBETA) 5 MG tablet   budesonide (PULMICORT) 0.5 MG/2ML nebulizer solution   clindamycin (CLEOCIN T) 1 % lotion   diphenhydrAMINE HCl (BENADRYL PO)   doxylamine,  Sleep, (UNISOM) 25 MG tablet   EPINEPHrine 0.3 mg/0.3 mL IJ SOAJ injection   esomeprazole (NEXIUM) 40 MG capsule   Evolocumab (REPATHA SURECLICK) 140 MG/ML SOAJ   Fluticasone-Umeclidin-Vilant (TRELEGY ELLIPTA) 200-62.5-25 MCG/ACT AEPB   furosemide (LASIX) 20 MG tablet   gabapentin (NEURONTIN) 300 MG capsule   levalbuterol (XOPENEX HFA) 45 MCG/ACT inhaler   levalbuterol (XOPENEX) 1.25 MG/3ML nebulizer solution   levothyroxine (SYNTHROID) 300 MCG tablet   LUER LOCK SAFETY SYRINGES 22G X 1-1/2" 3 ML MISC   Melatonin 10 MG TABS   Multiple Vitamin (MULTI-VITAMIN) tablet   risperiDONE (RISPERDAL) 0.5 MG tablet   rosuvastatin (CRESTOR) 40 MG tablet   sacubitril-valsartan (ENTRESTO) 49-51 MG   Suvorexant (BELSOMRA)  10 MG TABS   tamsulosin (FLOMAX) 0.4 MG CAPS capsule   testosterone cypionate (DEPOTESTOSTERONE CYPIONATE) 200 MG/ML injection   traZODone (DESYREL) 150 MG tablet   triamcinolone (NASACORT ALLERGY 24HR) 55 MCG/ACT AERO nasal inhaler   No current facility-administered medications for this encounter.    Jodell Cipro Ward, PA-C [facility] [phone]

## 2023-12-20 DIAGNOSIS — M25562 Pain in left knee: Secondary | ICD-10-CM | POA: Diagnosis not present

## 2023-12-22 ENCOUNTER — Inpatient Hospital Stay: Payer: 59

## 2023-12-22 ENCOUNTER — Inpatient Hospital Stay: Payer: 59 | Admitting: Hematology

## 2023-12-22 DIAGNOSIS — G4733 Obstructive sleep apnea (adult) (pediatric): Secondary | ICD-10-CM | POA: Diagnosis not present

## 2023-12-22 NOTE — Anesthesia Preprocedure Evaluation (Signed)
 Anesthesia Evaluation  Patient identified by MRN, date of birth, ID band Patient awake    Reviewed: Allergy & Precautions, NPO status , Patient's Chart, lab work & pertinent test results  History of Anesthesia Complications Negative for: history of anesthetic complications  Airway Mallampati: III  TM Distance: >3 FB Neck ROM: Full    Dental no notable dental hx. (+) Implants, Dental Advisory Given, Teeth Intact   Pulmonary asthma , Patient abstained from smoking., former smoker   Pulmonary exam normal breath sounds clear to auscultation       Cardiovascular hypertension, Pt. on medications and Pt. on home beta blockers (-) angina +CHF (HFrEF)  (-) Past MI Normal cardiovascular exam Rhythm:Regular Rate:Normal  10/2022 Echo as mildly decreased function. The left ventricle demonstrates  global hypokinesis. The left ventricular internal cavity size was mildly  dilated. There is mild left  ventricular hypertrophy. Left ventricular diastolic parameters are  consistent with Grade I diastolic dysfunction (impaired relaxation). The  average left ventricular global longitudinal strain is -13.6 %. The global  longitudinal strain is abnormal.   2. Right ventricular systolic function is normal. The right ventricular  size is normal. There is normal pulmonary artery systolic pressure. The  estimated right ventricular systolic pressure is 28.0 mmHg.   3. The mitral valve is normal in structure. No evidence of mitral valve  regurgitation. No evidence of mitral stenosis.   4. The aortic valve is normal in structure. Aortic valve regurgitation is  mild. No aortic stenosis is present.   5. The inferior vena cava is normal in size with greater than 50%  respiratory variability, suggesting right atrial pressure of 3 mmHg.      Neuro/Psych   Anxiety     CVA    GI/Hepatic ,GERD  ,,(+) Hepatitis -, C  Endo/Other  Hypothyroidism    Renal/GU       Musculoskeletal  (+) Arthritis , Osteoarthritis,    Abdominal   Peds  Hematology Lab Results      Component                Value               Date                      WBC                      9.7                 12/14/2023                HGB                      16.0                12/14/2023                HCT                      49.2                12/14/2023                MCV                      103.1 (H)           12/14/2023  PLT                      210                 12/14/2023              Anesthesia Other Findings All: Cipro, prednisone, Dapagliflozin, fluicinolone  Reproductive/Obstetrics                             Anesthesia Physical Anesthesia Plan  ASA: 3  Anesthesia Plan: General and Regional   Post-op Pain Management: Regional block*, Minimal or no pain anticipated and Ketamine IV*   Induction: Intravenous  PONV Risk Score and Plan: 3 and Treatment may vary due to age or medical condition, Midazolam and Ondansetron  Airway Management Planned: LMA  Additional Equipment: None  Intra-op Plan:   Post-operative Plan:   Informed Consent: I have reviewed the patients History and Physical, chart, labs and discussed the procedure including the risks, benefits and alternatives for the proposed anesthesia with the patient or authorized representative who has indicated his/her understanding and acceptance.     Dental advisory given  Plan Discussed with: CRNA and Surgeon  Anesthesia Plan Comments:        Anesthesia Quick Evaluation

## 2023-12-23 ENCOUNTER — Other Ambulatory Visit: Payer: Self-pay

## 2023-12-23 ENCOUNTER — Ambulatory Visit (HOSPITAL_COMMUNITY): Admitting: Anesthesiology

## 2023-12-23 ENCOUNTER — Ambulatory Visit (HOSPITAL_COMMUNITY): Payer: Self-pay | Admitting: Physician Assistant

## 2023-12-23 ENCOUNTER — Encounter: Payer: Self-pay | Admitting: Family Medicine

## 2023-12-23 ENCOUNTER — Encounter (HOSPITAL_COMMUNITY): Payer: Self-pay | Admitting: Orthopedic Surgery

## 2023-12-23 ENCOUNTER — Encounter (HOSPITAL_COMMUNITY)
Admission: RE | Disposition: A | Discharge status: Home / Self Care | Payer: Self-pay | Source: Home / Self Care | Attending: Orthopedic Surgery

## 2023-12-23 ENCOUNTER — Ambulatory Visit (HOSPITAL_COMMUNITY)
Admission: RE | Admit: 2023-12-23 | Discharge: 2023-12-23 | Disposition: A | Discharge status: Home / Self Care | Payer: 59 | Attending: Orthopedic Surgery | Admitting: Orthopedic Surgery

## 2023-12-23 DIAGNOSIS — M2351 Chronic instability of knee, right knee: Secondary | ICD-10-CM | POA: Diagnosis not present

## 2023-12-23 DIAGNOSIS — I110 Hypertensive heart disease with heart failure: Secondary | ICD-10-CM | POA: Diagnosis not present

## 2023-12-23 DIAGNOSIS — Z87891 Personal history of nicotine dependence: Secondary | ICD-10-CM | POA: Diagnosis not present

## 2023-12-23 DIAGNOSIS — K649 Unspecified hemorrhoids: Secondary | ICD-10-CM

## 2023-12-23 DIAGNOSIS — I351 Nonrheumatic aortic (valve) insufficiency: Secondary | ICD-10-CM | POA: Insufficient documentation

## 2023-12-23 DIAGNOSIS — S83511A Sprain of anterior cruciate ligament of right knee, initial encounter: Secondary | ICD-10-CM | POA: Diagnosis not present

## 2023-12-23 DIAGNOSIS — X58XXXA Exposure to other specified factors, initial encounter: Secondary | ICD-10-CM | POA: Insufficient documentation

## 2023-12-23 DIAGNOSIS — K219 Gastro-esophageal reflux disease without esophagitis: Secondary | ICD-10-CM | POA: Insufficient documentation

## 2023-12-23 DIAGNOSIS — Z419 Encounter for procedure for purposes other than remedying health state, unspecified: Secondary | ICD-10-CM

## 2023-12-23 DIAGNOSIS — M1990 Unspecified osteoarthritis, unspecified site: Secondary | ICD-10-CM | POA: Insufficient documentation

## 2023-12-23 DIAGNOSIS — I5020 Unspecified systolic (congestive) heart failure: Secondary | ICD-10-CM | POA: Insufficient documentation

## 2023-12-23 DIAGNOSIS — S83281A Other tear of lateral meniscus, current injury, right knee, initial encounter: Secondary | ICD-10-CM | POA: Insufficient documentation

## 2023-12-23 DIAGNOSIS — J45909 Unspecified asthma, uncomplicated: Secondary | ICD-10-CM | POA: Diagnosis not present

## 2023-12-23 DIAGNOSIS — E039 Hypothyroidism, unspecified: Secondary | ICD-10-CM | POA: Diagnosis not present

## 2023-12-23 DIAGNOSIS — F419 Anxiety disorder, unspecified: Secondary | ICD-10-CM | POA: Insufficient documentation

## 2023-12-23 DIAGNOSIS — G8918 Other acute postprocedural pain: Secondary | ICD-10-CM | POA: Diagnosis not present

## 2023-12-23 HISTORY — PX: KNEE ARTHROSCOPY WITH MENISCAL REPAIR: SHX5653

## 2023-12-23 HISTORY — DX: Sleep apnea, unspecified: G47.30

## 2023-12-23 SURGERY — KNEE ARTHROSCOPY WITH ANTERIOR CRUCIATE LIGAMENT (ACL) RECONSTRUCTION WITH HAMSTRING GRAFT
Anesthesia: Regional | Site: Knee | Laterality: Right

## 2023-12-23 MED ORDER — ONDANSETRON HCL 4 MG/2ML IJ SOLN
INTRAMUSCULAR | Status: AC
  Filled 2023-12-23: qty 2

## 2023-12-23 MED ORDER — ONDANSETRON HCL 4 MG PO TABS: 4.0000 mg | ORAL_TABLET | Freq: Three times a day (TID) | ORAL | 0 refills | Status: AC | PRN

## 2023-12-23 MED ORDER — PROPOFOL 10 MG/ML IV BOLUS
INTRAVENOUS | Status: DC | PRN
  Administered 2023-12-23: 30 mg via INTRAVENOUS
  Administered 2023-12-23: 200 mg via INTRAVENOUS
  Administered 2023-12-23: 50 mg via INTRAVENOUS

## 2023-12-23 MED ORDER — PHENYLEPHRINE 80 MCG/ML (10ML) SYRINGE FOR IV PUSH (FOR BLOOD PRESSURE SUPPORT)
PREFILLED_SYRINGE | INTRAVENOUS | Status: DC | PRN
  Administered 2023-12-23 (×4): 80 ug via INTRAVENOUS

## 2023-12-23 MED ORDER — MIDAZOLAM HCL 2 MG/2ML IJ SOLN
INTRAMUSCULAR | Status: DC | PRN
  Administered 2023-12-23: 2 mg via INTRAVENOUS

## 2023-12-23 MED ORDER — OXYCODONE HCL 5 MG PO TABS
ORAL_TABLET | ORAL | Status: AC
  Filled 2023-12-23: qty 2

## 2023-12-23 MED ORDER — DEXMEDETOMIDINE HCL IN NACL 80 MCG/20ML IV SOLN
INTRAVENOUS | Status: DC | PRN
Start: 2023-12-23 — End: 2023-12-23
  Administered 2023-12-23 (×2): 4 ug via INTRAVENOUS
  Administered 2023-12-23: 8 ug via INTRAVENOUS

## 2023-12-23 MED ORDER — OXYCODONE HCL 5 MG PO TABS: 5.0000 mg | ORAL_TABLET | Freq: Once | ORAL | Status: AC | PRN

## 2023-12-23 MED ORDER — KETOROLAC TROMETHAMINE 30 MG/ML IJ SOLN
INTRAMUSCULAR | Status: AC
  Administered 2023-12-23: 30 mg via INTRAVENOUS
  Filled 2023-12-23: qty 1

## 2023-12-23 MED ORDER — DEXAMETHASONE SODIUM PHOSPHATE 10 MG/ML IJ SOLN
INTRAMUSCULAR | Status: AC
  Filled 2023-12-23: qty 1

## 2023-12-23 MED ORDER — SODIUM CHLORIDE 0.9 % IR SOLN
Status: DC | PRN
  Administered 2023-12-23: 9000 mL

## 2023-12-23 MED ORDER — LIDOCAINE HCL (PF) 2 % IJ SOLN
INTRAMUSCULAR | Status: AC
  Filled 2023-12-23: qty 5

## 2023-12-23 MED ORDER — ROPIVACAINE HCL 5 MG/ML IJ SOLN
INTRAMUSCULAR | Status: DC | PRN
  Administered 2023-12-23: 30 mL via PERINEURAL

## 2023-12-23 MED ORDER — KETAMINE HCL 50 MG/5ML IJ SOSY
PREFILLED_SYRINGE | INTRAMUSCULAR | Status: AC
  Filled 2023-12-23: qty 5

## 2023-12-23 MED ORDER — OXYCODONE HCL 5 MG PO TABS
10.0000 mg | ORAL_TABLET | Freq: Once | ORAL | Status: AC | PRN
  Administered 2023-12-23: 10 mg via ORAL

## 2023-12-23 MED ORDER — DEXAMETHASONE SODIUM PHOSPHATE 10 MG/ML IJ SOLN
INTRAMUSCULAR | Status: DC | PRN
  Administered 2023-12-23: 10 mg via INTRAVENOUS

## 2023-12-23 MED ORDER — CEFAZOLIN SODIUM-DEXTROSE 2-4 GM/100ML-% IV SOLN
2.0000 g | INTRAVENOUS | Status: AC
  Administered 2023-12-23 (×2): 2 g via INTRAVENOUS
  Filled 2023-12-23: qty 100

## 2023-12-23 MED ORDER — OXYCODONE HCL 5 MG/5ML PO SOLN: 5.0000 mg | Freq: Once | ORAL | Status: AC | PRN

## 2023-12-23 MED ORDER — KETAMINE HCL 50 MG/5ML IJ SOSY
PREFILLED_SYRINGE | INTRAMUSCULAR | Status: DC | PRN
  Administered 2023-12-23: 10 mg via INTRAVENOUS
  Administered 2023-12-23: 30 mg via INTRAVENOUS
  Administered 2023-12-23: 10 mg via INTRAVENOUS

## 2023-12-23 MED ORDER — HYDROMORPHONE HCL 1 MG/ML IJ SOLN
INTRAMUSCULAR | Status: DC | PRN
  Administered 2023-12-23 (×4): .5 mg via INTRAVENOUS

## 2023-12-23 MED ORDER — ACETAMINOPHEN 10 MG/ML IV SOLN: 1000.0000 mg | Freq: Four times a day (QID) | INTRAVENOUS | Status: DC

## 2023-12-23 MED ORDER — OXYCODONE HCL 5 MG PO TABS
5.0000 mg | ORAL_TABLET | Freq: Once | ORAL | Status: DC | PRN
Start: 2023-12-23 — End: 2023-12-23

## 2023-12-23 MED ORDER — ONDANSETRON HCL 4 MG/2ML IJ SOLN: 4.0000 mg | Freq: Once | INTRAMUSCULAR | Status: DC | PRN

## 2023-12-23 MED ORDER — HYDROMORPHONE HCL 1 MG/ML IJ SOLN
INTRAMUSCULAR | Status: AC
  Administered 2023-12-23: 0.5 mg via INTRAVENOUS
  Filled 2023-12-23: qty 1

## 2023-12-23 MED ORDER — ACETAMINOPHEN 10 MG/ML IV SOLN
1000.0000 mg | Freq: Once | INTRAVENOUS | Status: AC | PRN
  Administered 2023-12-23: 1000 mg via INTRAVENOUS

## 2023-12-23 MED ORDER — FENTANYL CITRATE (PF) 100 MCG/2ML IJ SOLN
INTRAMUSCULAR | Status: DC | PRN
  Administered 2023-12-23 (×4): 25 ug via INTRAVENOUS

## 2023-12-23 MED ORDER — ORAL CARE MOUTH RINSE: 15.0000 mL | Freq: Once | OROMUCOSAL | Status: AC

## 2023-12-23 MED ORDER — HYDROMORPHONE HCL 1 MG/ML IJ SOLN: 1.0000 mg | Freq: Once | INTRAMUSCULAR | Status: DC | PRN

## 2023-12-23 MED ORDER — KETOROLAC TROMETHAMINE 30 MG/ML IJ SOLN: 30.0000 mg | Freq: Once | INTRAMUSCULAR | Status: AC | PRN

## 2023-12-23 MED ORDER — HYDROMORPHONE HCL 2 MG PO TABS
2.0000 mg | ORAL_TABLET | ORAL | 0 refills | Status: AC | PRN
Start: 2023-12-23 — End: 2023-12-28

## 2023-12-23 MED ORDER — BUPIVACAINE HCL (PF) 0.25 % IJ SOLN
INTRAMUSCULAR | Status: AC
  Filled 2023-12-23: qty 30

## 2023-12-23 MED ORDER — HYDROMORPHONE HCL 2 MG/ML IJ SOLN
INTRAMUSCULAR | Status: AC
  Filled 2023-12-23: qty 1

## 2023-12-23 MED ORDER — MIDAZOLAM HCL 2 MG/2ML IJ SOLN
INTRAMUSCULAR | Status: AC
  Filled 2023-12-23: qty 2

## 2023-12-23 MED ORDER — PHENYLEPHRINE 80 MCG/ML (10ML) SYRINGE FOR IV PUSH (FOR BLOOD PRESSURE SUPPORT)
PREFILLED_SYRINGE | INTRAVENOUS | Status: AC
  Filled 2023-12-23: qty 10

## 2023-12-23 MED ORDER — EPINEPHRINE PF 1 MG/ML IJ SOLN
INTRAMUSCULAR | Status: AC
  Filled 2023-12-23: qty 1

## 2023-12-23 MED ORDER — EPHEDRINE SULFATE-NACL 50-0.9 MG/10ML-% IV SOSY
PREFILLED_SYRINGE | INTRAVENOUS | Status: DC | PRN
  Administered 2023-12-23 (×2): 7.5 mg via INTRAVENOUS

## 2023-12-23 MED ORDER — CLONIDINE HCL (ANALGESIA) 100 MCG/ML EP SOLN
EPIDURAL | Status: DC | PRN
  Administered 2023-12-23: 100 ug

## 2023-12-23 MED ORDER — ACETAMINOPHEN 10 MG/ML IV SOLN
INTRAVENOUS | Status: AC
Start: 2023-12-23 — End: 2023-12-23
  Filled 2023-12-23: qty 100

## 2023-12-23 MED ORDER — EPINEPHRINE (ANAPHYLAXIS) 30 MG/30ML IJ SOLN
INTRAMUSCULAR | Status: DC | PRN
  Administered 2023-12-23: 1 mg

## 2023-12-23 MED ORDER — HYDROMORPHONE HCL 1 MG/ML IJ SOLN
0.2500 mg | INTRAMUSCULAR | Status: DC | PRN
  Administered 2023-12-23 (×3): 0.5 mg via INTRAVENOUS

## 2023-12-23 MED ORDER — HYDROMORPHONE HCL 1 MG/ML IJ SOLN
INTRAMUSCULAR | Status: AC
  Filled 2023-12-23: qty 1

## 2023-12-23 MED ORDER — FENTANYL CITRATE (PF) 100 MCG/2ML IJ SOLN
INTRAMUSCULAR | Status: AC
  Filled 2023-12-23: qty 2

## 2023-12-23 MED ORDER — PROPOFOL 10 MG/ML IV BOLUS
INTRAVENOUS | Status: AC
  Filled 2023-12-23: qty 20

## 2023-12-23 MED ORDER — EPHEDRINE 5 MG/ML INJ
INTRAVENOUS | Status: AC
  Filled 2023-12-23: qty 5

## 2023-12-23 MED ORDER — HYDROMORPHONE HCL 1 MG/ML IJ SOLN
0.2500 mg | INTRAMUSCULAR | Status: DC | PRN
  Administered 2023-12-23: 0.5 mg via INTRAVENOUS

## 2023-12-23 MED ORDER — CHLORHEXIDINE GLUCONATE 0.12 % MT SOLN
15.0000 mL | Freq: Once | OROMUCOSAL | Status: AC
  Administered 2023-12-23: 15 mL via OROMUCOSAL

## 2023-12-23 MED ORDER — HYDROMORPHONE HCL 1 MG/ML IJ SOLN
INTRAMUSCULAR | Status: AC
  Administered 2023-12-23: 0.5 mg via INTRAVENOUS
  Filled 2023-12-23: qty 2

## 2023-12-23 MED ORDER — HYDROCORTISONE ACETATE 25 MG RE SUPP: 25.0000 mg | Freq: Two times a day (BID) | RECTAL | 0 refills | Status: DC

## 2023-12-23 MED ORDER — LACTATED RINGERS IV SOLN: INTRAVENOUS | Status: DC

## 2023-12-23 MED ORDER — OXYCODONE HCL 5 MG PO TABS
ORAL_TABLET | ORAL | Status: AC
  Administered 2023-12-23: 5 mg via ORAL
  Filled 2023-12-23: qty 1

## 2023-12-23 SURGICAL SUPPLY — 80 items
ANCHOR BUTTON TIGHTROPE 14 (Anchor) IMPLANT
ANCHOR PEEK 4.75X19.1 SWLK C (Anchor) IMPLANT
ANCHOR SUT FBRTK 2.6 SP1.7 TAP (Anchor) IMPLANT
BAG COUNTER SPONGE SURGICOUNT (BAG) ×2 IMPLANT
BANDAGE ESMARK 6X9 LF (GAUZE/BANDAGES/DRESSINGS) IMPLANT
BLADE SHAVER TORPEDO 4X13 (MISCELLANEOUS) IMPLANT
BLADE SURG 15 STRL LF DISP TIS (BLADE) ×2 IMPLANT
BLADE SURG SZ10 CARB STEEL (BLADE) ×2 IMPLANT
BNDG ELASTIC 6INX 5YD STR LF (GAUZE/BANDAGES/DRESSINGS) ×2 IMPLANT
BNDG ESMARK 6X9 LF (GAUZE/BANDAGES/DRESSINGS) IMPLANT
BUR OVAL CARBIDE 4.0 (BURR) IMPLANT
BURR OVAL 8 FLU 4.0X13 (MISCELLANEOUS) IMPLANT
BURR OVAL 8 FLU 5.0X13 (MISCELLANEOUS) IMPLANT
COVER BACK TABLE 60X90IN (DRAPES) ×2 IMPLANT
CUFF TRNQT CYL 34X4.125X (TOURNIQUET CUFF) ×2 IMPLANT
CUTTER BONE 4.0MM X 13CM (MISCELLANEOUS) ×2 IMPLANT
CUTTER KNOT PUSHER W/ SKID (INSTRUMENTS) IMPLANT
DRAPE ARTHROSCOPY W/POUCH 114 (DRAPES) ×2 IMPLANT
DRAPE C-ARM 42X120 X-RAY (DRAPES) IMPLANT
DRAPE INCISE IOBAN 66X45 STRL (DRAPES) IMPLANT
DRAPE SHEET LG 3/4 BI-LAMINATE (DRAPES) IMPLANT
DRAPE TOP 10253 STERILE (DRAPES) IMPLANT
DRAPE U-SHAPE 47X51 STRL (DRAPES) ×2 IMPLANT
DURAPREP 26ML APPLICATOR (WOUND CARE) ×2 IMPLANT
ELECT REM PT RETURN 15FT ADLT (MISCELLANEOUS) ×2 IMPLANT
FIBERSTICK 2 (SUTURE) IMPLANT
GAUZE 4X4 16PLY ~~LOC~~+RFID DBL (SPONGE) ×2 IMPLANT
GAUZE PAD ABD 8X10 STRL (GAUZE/BANDAGES/DRESSINGS) ×2 IMPLANT
GAUZE SPONGE 4X4 12PLY STRL (GAUZE/BANDAGES/DRESSINGS) ×2 IMPLANT
GAUZE XEROFORM 1X8 LF (GAUZE/BANDAGES/DRESSINGS) ×2 IMPLANT
GEL BONE GRAFT DBM ALLOSYNC 5 (Bone Implant) IMPLANT
GLOVE BIO SURGEON STRL SZ7.5 (GLOVE) ×4 IMPLANT
GLOVE BIOGEL PI IND STRL 8 (GLOVE) ×4 IMPLANT
GOWN STRL REUS W/ TWL XL LVL3 (GOWN DISPOSABLE) ×4 IMPLANT
GRAFT TISS 60-80 FRZN TENDON (Tissue) IMPLANT
IMMOBILIZER KNEE 20 (SOFTGOODS) ×2 IMPLANT
IMMOBILIZER KNEE 20 THIGH 36 (SOFTGOODS) IMPLANT
IMP SYS 2ND FIX PEEK 4.75X19.1 (Miscellaneous) ×2 IMPLANT
IMPL FIBERSTITCH 1.5X24 CVD (Anchor) IMPLANT
IMPL SYS 2ND FX PEEK 4.75X19.1 (Miscellaneous) IMPLANT
IMPLANT FIBERSTITCH 1.5X24 CVD (Anchor) ×2 IMPLANT
IV NS IRRIG 3000ML ARTHROMATIC (IV SOLUTION) ×8 IMPLANT
KIT BASIN OR (CUSTOM PROCEDURE TRAY) ×2 IMPLANT
KIT BIOCARTILAGE LG JOINT MIX (KITS) IMPLANT
KIT TURNOVER KIT A (KITS) ×2 IMPLANT
MANIFOLD NEPTUNE II (INSTRUMENTS) ×2 IMPLANT
NDL HYPO 22X1.5 SAFETY MO (MISCELLANEOUS) IMPLANT
NEEDLE HYPO 22X1.5 SAFETY MO (MISCELLANEOUS) IMPLANT
PACK ARTHROSCOPY DSU (CUSTOM PROCEDURE TRAY) ×2 IMPLANT
PAD ARMBOARD POSITIONER FOAM (MISCELLANEOUS) IMPLANT
PADDING CAST COTTON 6X4 STRL (CAST SUPPLIES) IMPLANT
PENCIL SMOKE EVACUATOR (MISCELLANEOUS) IMPLANT
SPONGE T-LAP 4X18 ~~LOC~~+RFID (SPONGE) ×2 IMPLANT
STRIP CLOSURE SKIN 1/2X4 (GAUZE/BANDAGES/DRESSINGS) ×2 IMPLANT
SUCTION TUBE FRAZIER 10FR DISP (SUCTIONS) ×2 IMPLANT
SUT 2 FIBERLOOP 20 STRT BLUE (SUTURE) IMPLANT
SUT FIBERWIRE #2 38 REV NDL BL (SUTURE) IMPLANT
SUT FIBERWIRE #2 38 T-5 BLUE (SUTURE) IMPLANT
SUT MNCRL AB 3-0 PS2 18 (SUTURE) ×2 IMPLANT
SUT MNCRL AB 3-0 PS2 27 (SUTURE) IMPLANT
SUT MON AB 2-0 CT1 36 (SUTURE) IMPLANT
SUT VIC AB 0 CT2 27 (SUTURE) IMPLANT
SUT VIC AB 2-0 CT2 27 (SUTURE) ×2 IMPLANT
SUT VICRYL 0 UR6 27IN ABS (SUTURE) ×2 IMPLANT
SUTURE 2 FIBERLOOP 20 STRT BLU (SUTURE) IMPLANT
SUTURE FIBERWR #2 38 T-5 BLUE (SUTURE) IMPLANT
SUTURE FIBERWR#2 38 REV NDL BL (SUTURE) IMPLANT
SUTURE TIGERSTICK 2 TIGERWIR 2 (MISCELLANEOUS) IMPLANT
SYR CONTROL 10ML LL (SYRINGE) ×2 IMPLANT
SYS ALLOGRAFT GRAFTLINK CP2 (Anchor) ×2 IMPLANT
SYSTEM ALLOGRAFT GRAFTLINK CP2 (Anchor) IMPLANT
TIGERSTICK 2 TIGERWIRE 2 (MISCELLANEOUS) IMPLANT
TISSUE GRAFTLINK FGL (Tissue) ×2 IMPLANT
TOWEL OR 17X26 10 PK STRL BLUE (TOWEL DISPOSABLE) ×2 IMPLANT
TUBE SUCTION HIGH CAP CLEAR NV (SUCTIONS) ×2 IMPLANT
TUBING ARTHROSCOPY IRRIG 16FT (MISCELLANEOUS) ×2 IMPLANT
TUBING CONNECTING 10 (TUBING) ×2 IMPLANT
WAND ABLATOR APOLLO I90 (BUR) IMPLANT
WAND APOLLORF SJ50 AR-9845 (SURGICAL WAND) ×2 IMPLANT
WATER STERILE IRR 500ML POUR (IV SOLUTION) ×2 IMPLANT

## 2023-12-23 NOTE — H&P (Signed)
 ORTHOPAEDIC H and P  REQUESTING PHYSICIAN: Yolonda Kida, MD  PCP:  Loyola Mast, MD  Chief Complaint: Right knee pain  HPI: Troy [name] is a 48 y.o. male who complains of  right knee pain and instability.  Here today for right knee surgery with acl recon and lateral meniscus work.  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   CHF (congestive heart failure) (HCC)    Chronic fatigue    Depression    Drug abuse (HCC)    Ectopic atrial rhythm    Family history of adverse reaction to anesthesia    Dad  has problem with lots of medications   Family history of breast cancer 05/19/2021   Family history of colon cancer in mother    GERD (gastroesophageal reflux disease)    +Barretts   Hematochezia    hemorrhoids   Hepatic steatosis    +hepatomegaly   History of CVA (cerebrovascular accident)    radiation-induced carotid damage/changes   History of hepatitis C    +treated   History of hiatal hernia    History of Hodgkin's lymphoma    age 74->mantle RT + splenectomy   History of splenectomy    Hodgkin's lymphoma (HCC)    Hyperlipidemia    Hypertension    Insomnia    Male hypogonadism    Moderate persistent asthma    Obesity, Class I, BMI 30-34.9    Papillary thyroid carcinoma (HCC) 2010   total thyroidectomy + neck dissection   Pneumonia    Postsurgical hypothyroidism    Prediabetes    Pulmonary fibrosis (HCC)    ?rad induced?   Sleep apnea    mild  cpap   Stroke [facility])    x2  2016  october no defecits memory affected   Thyroid ca [facility])    Past Surgical History:  Procedure Laterality Date   COLONOSCOPY  07/04/2019   done for hematochezia and FH col ca-mother: Mod int hem w/inflamm.  No adenomatous polyps.   INCISIONAL HERNIA REPAIR  06/2019   RUQ (prev lap append site)   LAPAROSCOPIC  APPENDECTOMY N/A 03/25/2017   Procedure: APPENDECTOMY LAPAROSCOPIC; EXTENSIVE LYSIS OF ADHESIONS FROM PRIOR RADIATION AND SURGERY;  Surgeon: Luretha Murphy, MD;  Location: [facility];  Service: General;  Laterality: N/A;   MENISCUS REPAIR Left 09/16/2023   Procedure: REPAIR OF LATERAL MENISCUS;  Surgeon: Yolonda Kida, MD;  [facility];  Service: Orthopedics;  Laterality: Left;  120 block in preop   PARATHYROIDECTOMY     SPLENECTOMY     TEE WITHOUT CARDIOVERSION N/A 07/18/2015   Procedure: TRANSESOPHAGEAL ECHOCARDIOGRAM (TEE);  Surgeon: [name], MD;  Location: [facility] ENDOSCOPY;  Service: Cardiovascular;  Laterality: N/A;   THYROIDECTOMY     Social History   Socioeconomic History   Marital status: Single    Spouse name: Not on file   Number of children: 0   Years of education: Not on file   Highest education level: Not on file  Occupational History   Not on file  Tobacco Use   Smoking status: Former    Current packs/day: 0.00    Types: Cigarettes    Quit date: 2013    Years since quitting: 12.2   Smokeless  tobacco: Never  Vaping Use   Vaping status: Some Days  Substance and Sexual Activity   Alcohol use: Not Currently    Comment: occasionally   Drug use: Yes    Types: Marijuana    Comment: pt states "I'm uncomfortable answering this question"   Sexual activity: Yes  Other Topics Concern   Not on file  Social History Narrative   Are you right handed or left handed? Right   Are you currently employed ? No   What is your current occupation? None   Do you live at home alone? yes   Who lives with you? no   What type of home do you live in: 1 story or 2 story? 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 : No  Physical Activity: Not on file  Stress: Not on file  Social Connections: Not on file   Family History  Problem Relation Age of Onset   Breast cancer Mother        dx early 50s   Colon cancer Mother        dx early 41s   Other Mother        NTHL1 homozygous   Sleep apnea Mother    Parkinsonism Father    Multiple myeloma Father        dx 24s   Sleep apnea Father    Breast cancer Maternal Aunt    Stroke Maternal Grandmother    Leukemia Other        dx <20; two paternal first cousins once removed   Allergies  Allergen Reactions   Ciprofloxacin Other (See Comments)    Spontaneous rupture of tendon   Earnestine Leys Officinalis] Anaphylaxis and Hives   Fluocinolone Other (See Comments)    "spontaneous rupture of tendons" per pt dr.   Dapagliflozin Nausea And Vomiting    abscess  Patient refuses to give further information.   Prednisone Rash   Prior to Admission medications   Medication Sig Start Date End Date Taking? Authorizing Provider  ammonium lactate (LAC-HYDRIN) 12 % lotion Apply 1 application topically as needed for dry skin.   Yes [provider]  aspirin 325 MG tablet Take 1 tablet (325 mg total) by mouth daily. 07/18/15  Yes Ghimire, Werner Lean, MD  Azelastine HCl 137 MCG/SPRAY SOLN USE 1 SPRAY(S) IN EACH NOSTRIL ONCE DAILY AS DIRECTED 06/09/23  Yes Alfonse Spruce, MD  baclofen (LIORESAL) 10 MG tablet Take 1 tablet by mouth three times daily as needed 05/10/22  Yes Rudd, Bertram Millard, MD  bisoprolol (ZEBETA) 5 MG tablet TAKE 1 TABLET BY MOUTH AT BEDTIME 12/06/23  Yes Chandrasekhar, Mahesh A, MD   budesonide (PULMICORT) 0.5 MG/2ML nebulizer solution Mix with Xopenex nebulizer solution every 6 hours as needed. 04/08/23  Yes Alfonse Spruce, MD  clindamycin (CLEOCIN T) 1 % lotion Apply topically as needed (rash). 05/25/22  Yes [provider]  diphenhydrAMINE HCl (BENADRYL PO) Take by mouth.   Yes [provider]  doxylamine, Sleep, (UNISOM) 25 MG tablet Take 25 mg by mouth at bedtime as needed for sleep.   Yes [provider]  EPINEPHrine 0.3 mg/0.3 mL IJ SOAJ injection Inject 0.3 mg into the muscle as needed for anaphylaxis. 04/07/23  Yes Alfonse Spruce, MD  esomeprazole (NEXIUM) 40 MG capsule TAKE 1 CAPSULE BY MOUTH TWICE DAILY BEFORE A MEAL 01/17/23  Yes Loyola Mast, MD  Evolocumab (REPATHA SURECLICK) 140 MG/ML SOAJ Inject 140 mg into the skin every 14 (fourteen) days. 05/20/23  Yes Christell Constant, MD  Fluticasone-Umeclidin-Vilant (TRELEGY ELLIPTA) 200-62.5-25 MCG/ACT AEPB INHALE 1 PUFF INTO LUNGS ONCE DAILY 12/12/23  Yes Alfonse Spruce, MD  furosemide (LASIX) 20 MG tablet Take 1 tablet (20 mg total) by mouth 3 (three) times a week. Patient taking differently: Take 20 mg by mouth 3 (three) times a week. Rotating days (days he isn't weight lifting) 12/31/22  Yes Chandrasekhar, Mahesh A, MD  gabapentin (NEURONTIN) 300 MG capsule TAKE 2 CAPSULES BY MOUTH IN THE MORNING AND 2 CAPSULES AT NOON AND 4 CAPSULES AT BEDTIME 11/07/23  Yes Loyola Mast, MD  levalbuterol [facility] HFA) 45 MCG/ACT inhaler INHALE 2 PUFFS BY MOUTH EVERY 4 HOURS AS NEEDED FOR WHEEZING 10/31/23  Yes Alfonse Spruce, MD  levalbuterol Paul[facility] Aus) 1.25 MG/3ML nebulizer solution Take 1.25 mg by nebulization every 4 (four) hours as needed for wheezing. 04/08/23  Yes Alfonse Spruce, MD  levothyroxine (SYNTHROID) 300 MCG tablet Take 300 mcg by mouth daily before breakfast.   Yes [provider]  Melatonin 10 MG TABS Take by mouth at bedtime.   Yes [provider]  Multiple Vitamin (MULTI-VITAMIN) tablet Take 1 tablet by mouth daily. 07/24/09  Yes [provider]  risperiDONE (RISPERDAL) 0.5 MG tablet Take 1 tablet (0.5 mg total) by mouth at bedtime. 11/11/23  Yes Mozingo, Thereasa Solo, NP  rosuvastatin (CRESTOR) 40 MG tablet Take 1 tablet by mouth once daily 03/20/23  Yes Rudd, Bertram Millard, MD  sacubitril-valsartan (ENTRESTO) 49-51 MG Take 1 tablet by mouth 2 (two) times daily. 09/15/23  Yes Chandrasekhar, Rondel Jumbo, MD  Suvorexant (BELSOMRA) 10 MG TABS TAKE 1 TABLET BY MOUTH AT BEDTIME 08/22/23  Yes Loyola Mast, MD  tamsulosin [facility]) 0.4 MG CAPS capsule Take 1 capsule by mouth once daily 12/12/23  Yes Rudd, Bertram Millard, MD  testosterone cypionate (DEPOTESTOSTERONE CYPIONATE) 200 MG/ML injection Inject 0.6 mLs (120 mg total) into the muscle once a week. Patient taking differently: Inject 120 mg into the muscle once a week. Fridays or Saturdays 06/02/22  Yes Loyola Mast, MD  traZODone (DESYREL) 150 MG tablet TAKE 2 TABLETS BY MOUTH AT BEDTIME 03/21/23  Yes Loyola Mast, MD  triamcinolone (NASACORT ALLERGY 24HR) 55 MCG/ACT AERO nasal inhaler Place 2 sprays into the nose daily.   Yes [provider]  LUER LOCK SAFETY SYRINGES 22G X 1-1/2" 3 ML MISC USE FOR SELF ADMINISTER OF TESTOSTERONE 2CC ONCE EVERY 2 WEEKS 09/30/20   [provider]   No results found.  Positive ROS: All other systems have been reviewed and were otherwise negative with the exception of those mentioned in the HPI and as above.  Physical Exam: General: Alert, no acute distress Cardiovascular: No pedal edema Respiratory: No  cyanosis, no use of accessory musculature GI: No organomegaly, abdomen is soft and non-tender Skin: No lesions in the area of chief complaint Neurologic: Sensation intact distally Psychiatric: Patient is competent for consent with normal mood and affect Lymphatic: No axillary or cervical  lymphadenopathy  MUSCULOSKELETAL: RLE- wwp, nvi  Assessment: Right ACL tear Right lateral meniscus tear   Plan: Moveing forward today wth right knee scope and acl recon with allograft and possible lateral meniscus repair.   The risks, benefits, and alternatives were discussed with the patient. There are risks associated with the surgery including, but not limited to, problems with anesthesia (death), infection, differences in leg length/angulation/rotation, fracture of bones, loosening or failure of implants, malunion, nonunion, hematoma (blood accumulation) which may require surgical drainage, blood clots, pulmonary embolism, nerve injury (foot drop), and blood vessel injury. The patient understands these risks and elects to proceed.  Dc home post op    Yolonda Kida, MD Cell [phone]    12/23/2023 6:18 AM

## 2023-12-23 NOTE — Progress Notes (Signed)
 Patient seen by Dr/s Richardson Landry and Aundria Rud in PACU regarding persistent pain after receiving 3 mg of IV hydromorphone, 5 mg Po oxycodone, 30 mg IV Toradol, and 1,000 mg of IV Tylenol. Both MDs to bedside to discuss pain management in PACU with patient. Dr. Richardson Landry states that patient may have additional 10 of Oxycodne for pain in PACU. Dr. Aundria Rud states that patient does not knee to have Bledsoe brace applied if his pain is does not allow. However, MD states to have patient bring Bledsoe brace to post-op follow-up appointment.

## 2023-12-23 NOTE — Anesthesia Procedure Notes (Signed)
 Anesthesia Regional Block: Adductor canal block   Pre-Anesthetic Checklist: , timeout performed,  Correct Patient, Correct Site, Correct Laterality,  Correct Procedure, Correct Position, site marked,  Risks and benefits discussed,  Surgical consent,  Pre-op evaluation,  At surgeon's request and post-op pain management  Laterality: Lower and Right  Prep: chloraprep       Needles:  Injection technique: Single-shot  Needle Type: Echogenic Needle     Needle Length: 9cm  Needle Gauge: 22     Additional Needles:   Procedures:,,,, ultrasound used (permanent image in chart),,    Narrative:  Start time: 12/23/2023 6:54 AM End time: 12/23/2023 7:00 AM Injection made incrementally with aspirations every 5 mL.  Performed by: Personally  Anesthesiologist: [name], MD  Additional Notes: Block assessed prior to surgery. Pt tolerated procedure well.

## 2023-12-23 NOTE — Discharge Instructions (Addendum)
 DISCHARGE INSTRUCTIONS: ________________________________________________________________________________ ACL RECONSTRUCTION HOME EXERCISE PROGRAM (0-2 WEEKS)   Elevate the leg above your heart as often as possible. 50% WB with the Bledsoe brace, use crutches  You should sleep in the knee brace with it locked in full extension.  He may remove for showering.  Otherwise he may remove for exercise. Start normal showering on postoperative day #3.  Do not submerge underwater Goals for first two weeks:  minimal swelling, motion 0-90, walking with brace without crutches and positive attitude about PT.  Use pain medication as needed.  You may also take Tylenol and Advil around-the-clock in alternating fashion in addition to the pain medication.  To prevent constipation use Colace 100mg . twice a day while on pain medication.  If constipated, use Miralax 17 gm once a day and drink plenty of fluids.  These medications can be obtained at the pharmacy without a prescription.   Follow up in the office in 14 days. You may remove your postoperative bandages on the third day from surgery and begin showering.  Do not remove the Steri-Strips.  Do not submerge underwater.  Replace your Ace bandage over your wounds before reapplying your knee brace You should also continue to wear the TED hose for 2 weeks postoperatively. You should also take an 81 mg aspirin once per day x 6 weeks for the prevention of DVT.

## 2023-12-23 NOTE — Op Note (Addendum)
 Surgery Date: 12/23/2023    Surgeon(s): Yolonda Kida, MD   ASSIST: Dion Saucier, PA-C  Assistant attestation:  PA Mcclung present for the entire procedure.  Implants:  Arthrex all inside cortical buttons on femur and tibia 4.75 PEEK swivel lock x 1. Allograft with semitendinosus Arthrex Allosync for bone graft for tunnels Arthrex fiber stitch for meniscus repair x 1   ANESTHESIA:  general, and adductor block   IV FLUIDS AND URINE: See anesthesia.   TOURNIQUET:  60 minutes   DRAINS: none   COMPLICATIONS: None.     ESTIMATED BLOOD LOSS: minimal   PREOPERATIVE DIAGNOSES:  1. Right knee lateral meniscus tear 2. Right knee complete anterior cruciate ligament rupture   POSTOPERATIVE DIAGNOSES:  same   PROCEDURES PERFORMED:  1.  Right knee arthroscopy with Hamstring allograft ACL reconstruction 2.  Right knee arthroscopic lateral meniscus repair    DESCRIPTION OF PROCEDURE:  Troy [name] is a 48 year old male with right knee Lateral meniscus tear, Complete ACL rupture, partial MCL tear, possible medial meniscus tear.  They sustained these injuries about 2 months prior to coming to the operating room today.  After a short period of prehabilitation to allow for return of ROM and quadriceps strenght, we discussed proceeding with arthroscopically assisted hamstring autograft ACL reconstruction and repair lateral meniscectomy versus repair.  We reviewed the risks benefits and indications of this procedure including but not limited to bleeding, infection, damage to neurovascular structures, need for future surgery, developed an of arthrosis, rupture of graft, continued instability of the knee, and developement of blood clots and risk of anesthesia.  All questions answered.   The patient was identified in the preoperative holding area and the operative extremity was marked. The patient was brought to the operating room and transferred to operating table in a supine  position. Satisfactory general anesthesia was induced by anesthesiology.     Examination under anesthesia revealed a grade 2B Lachman, grade 2 pivot shift, and stable to varus and valgus stress.      At the back table, we next prepared the graft per the manufacture protocol for the all inside fixation technique with ABS and tight rope button. The graft was pretensioned on the back table and wrapped in a saline-soaked gauze.  The graft measured 68 mm in total length, 8.5 mm on femoral tunnel diameter, and 8.5 mm diameter on the tibial tunnel.   Standard anterolateral, anteromedial arthroscopy portals were obtained. The anteromedial portal was obtained with a spinal needle for localization under direct visualization with subsequent diagnostic findings.    Anteromedial and anterolateral chambers: mild synovitis. The synovitis was debrided with a 4.5 mm full radius shaver through both the anteromedial and lateral portals.    Suprapatellar pouch and gutters: no synovitis or debris. Patella chondral surface: Grade 0 Trochlear chondral surface: Grade 0 Patellofemoral tracking: Midline, no tilt Medial meniscus: Intact, no tear.  Medial femoral condyle flexion bearing surface: Grade 2 Medial femoral condyle extension bearing surface: Grade 0 Medial tibial plateau: Grade 0 Anterior cruciate ligament:Complete mid substance tear Posterior cruciate ligament:stable Lateral meniscus: Oblique tear of the posterior horn adjacent to the root.  The root was technically involved and completely detached, but a very robust ligament upon free and ligament of Wrisberg creating good stability of the posterior horn, absent the root tear.  Lateral femoral condyle flexion bearing surface: Grade 1 Lateral femoral condyle extension bearing surface: Grade 0 Lateral tibial plateau: Grade 1   The lateral meniscus was inspected  closely and found to have the radial tear as described above.  Essentially short stump of the  posterior root of the lateral meniscus was intact to the tibia and then very robust ligament of Humphrey.  We biologically prepared the interface of the tear and then used an Arthrex 24 degree all inside fiber stitch to place a horizontal mattress to close that gap.  This was very solid.  Next, the ACL reconstruction was undertaken. The ACL stump was removed with thermal ablation and shaver and anatomic bony landmarks were marked for the placement of the femoral and tibial sockets.  Arthrex retroguides and Flipcutters were used to create the sockets and perform the procedure by an all-inside GraftLink technique.  The femoral socket was created at the inferior portion of the bifurcate ridge of the lateral femoral wall with a size 8.5 mm FlipCutter to a depth of  15 mm while the tibial socket was created at the center of the ACL footprint from front to back and toward the base of the medial tibial eminence from medial to lateral, to a depth of 23-25 mm with a 8.5 mm FlipCutter. Bony debris was removed and the edges of socket apertures were smoothed. Suture shuttles were used to deliver the graft into the femoral socket first and the tibial socket second. The graft was then secured within the sockets, cinching the self-locking sutures overtop of the proximal and distal cortical buttons with the knee in a reduced position maintained at 20 degrees flexion while a moderate force posterior drawer was applied.  After this preliminary tensioning, the knee was placed through several flexion-extension cycles to eliminate any graft settling or excursion and the graft was re-tensioned in the same manner and the sutures were tied over top of the buttons proximally and distally, and the four tibial sided suture arms were secondarily secured at the proximal tibia with 1 SwiveLock anchor. Of note we did also pass a free labral tape through the ACL fixation as an separate internal brace backup fixation.   Final images of the ACL  graft were obtained, revealing no lateral wall or roof impingement of the graft at the notch through range of motion.  Stability of the ACL graft was assessed and found to be normalized at grade 0 lachman and grade 0 pivot shift.    The wounds were all closed in layers per usual.  Dressings were applied and a brace placed And locked in 0 of flexion..  There were no apparent complications.  The patient was awakened and taken to recovery room in satisfactory condition.   POSTOPERATIVE PLAN:  Troy [name] will be touch down weight bearing on crutches until cleared by The therapist.  They will likewise be in The knee brace with it locked until quad function is normalized. They will be on 81 mg asa daily for 6 weeks for DVT PPX.  they will return to the clinic to see the surgeon in 2 weeks.   Yolonda Kida

## 2023-12-23 NOTE — Progress Notes (Signed)
 Pt requested to speak to nurse manager while in PACU. Pt upset that his pain has not been managed as he feels that it should have been. 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 Pt states that he had to "throw a stomping fit" to get the nurse manager to come to speak with him. Pt visibly upset with PACU RN and anesthesia about pain medication and pain level. 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 This RN asked what is an acceptable pain level for pt and pt states 5 on scale of 0-10. Pt states that his pain has improved after he was given additional oxycodone which "they apparently forgot to give me." This NM explains that we would like pt to wait 30 mins after administration of last pain medication before discharging so we can monitor the effects. Pt is agreeable. Pt vitals remain stable. Pt acknowledges that he understands the discharge instructions. AVS given to pt. IV removed. Monitored pt to get dressed and assisted to wheelchair. Transported pt to main entrance where his friend was waiting to drive pt home. Pt appreciative of NM assistance. While in PACU pt refused to allow NT to assist him with dressing. Pt states "do you see her, this would be upsetting to my girlfriend. My wishes should be respected." NT did not assist pt with dressing. This NM stood at bedside while pt dressed himself. This NM provided active listening to pt and assistance with discharge from PACU.

## 2023-12-23 NOTE — Transfer of Care (Signed)
 Immediate Anesthesia Transfer of Care Note  Patient: Troy [name]  Procedure(s) Performed: Procedure(s): KNEE ARTHROSCOPY WITH ANTERIOR CRUCIATE LIGAMENT (ACL) RECONSTRUCTION WITH ALLOGRAFT (Right) KNEE ARTHROSCOPY WITH POSSIBLE LATERAL  MENISCAL REPAIR (Right)  Patient Location: PACU  Anesthesia Type:General and Regional  Level of Consciousness: Patient easily awoken, comfortable, cooperative, following commands, responds to stimulation.   Airway & Oxygen Therapy: Patient spontaneously breathing, ventilating well, oxygen via simple oxygen mask.  Post-op Assessment: Report given to PACU RN, vital signs reviewed and stable, moving all extremities.   Post vital signs: Reviewed and stable.  Complications: No apparent anesthesia complications Last Vitals:  Vitals Value Taken Time  BP 145/96 12/23/23 0918  Temp    Pulse 94 12/23/23 0923  Resp 18 12/23/23 0923  SpO2 97 % 12/23/23 0923  Vitals shown include unfiled device data.  Last Pain:  Vitals:   12/23/23 0616  TempSrc: Oral         Complications: No notable events documented.

## 2023-12-23 NOTE — Progress Notes (Signed)
 Orthopedic Tech Progress Note Patient Details:  Troy [name] 20-Nov-1975 [id number] Bledsoe brace was delivered to OR desk  Patient ID: Troy [name], male   DOB: 25-Apr-1976, 48 y.o.   MRN: [id number]  [name] 12/23/2023, 9:15 AM

## 2023-12-23 NOTE — Brief Op Note (Signed)
 12/23/2023  9:01 AM  PATIENT:  Troy [name]  48 y.o. male  PRE-OPERATIVE DIAGNOSIS:  Right knee anterior cruciate ligament tear, lateral meniscus tear  POST-OPERATIVE DIAGNOSIS:  Right knee anterior cruciate ligament tear, lateral meniscus tear  PROCEDURE:  Procedure(s): KNEE ARTHROSCOPY WITH ANTERIOR CRUCIATE LIGAMENT (ACL) RECONSTRUCTION WITH ALLOGRAFT (Right) KNEE ARTHROSCOPY WITH POSSIBLE LATERAL  MENISCAL REPAIR (Right)  SURGEON:  Surgeons and Role:    * Yolonda Kida, MD - Primary  PHYSICIAN ASSISTANT: Dion Saucier, PA-C  ANESTHESIA:   regional and general  EBL: 10 cc  BLOOD ADMINISTERED:none  DRAINS: none   LOCAL MEDICATIONS USED:  NONE  SPECIMEN:  No Specimen  DISPOSITION OF SPECIMEN:  N/A  COUNTS:  YES  TOURNIQUET:   Total Tourniquet Time Documented: Thigh (Right) - 61 minutes Total: Thigh (Right) - 61 minutes   DICTATION: .Note written in EPIC  PLAN OF CARE: Discharge to home after PACU  PATIENT DISPOSITION:  PACU - hemodynamically stable.   Delay start of Pharmacological VTE agent (>24hrs) due to surgical blood loss or risk of bleeding: not applicable

## 2023-12-23 NOTE — Anesthesia Procedure Notes (Signed)
 Procedure Name: LMA Insertion Date/Time: 12/23/2023 7:32 AM  Performed by: Ludwig Lean, CRNAPre-anesthesia Checklist: Patient identified, Emergency Drugs available, Suction available and Patient being monitored Patient Re-evaluated:Patient Re-evaluated prior to induction Oxygen Delivery Method: Circle system utilized Preoxygenation: Pre-oxygenation with 100% oxygen Induction Type: IV induction Ventilation: Mask ventilation without difficulty LMA: LMA inserted LMA Size: 5.0 Number of attempts: 1 Placement Confirmation: positive ETCO2 and breath sounds checked- equal and bilateral Tube secured with: Tape Dental Injury: Teeth and Oropharynx as per pre-operative assessment

## 2023-12-23 NOTE — Anesthesia Postprocedure Evaluation (Signed)
 Anesthesia Post Note  Patient: Troy [name]  Procedure(s) Performed: KNEE ARTHROSCOPY WITH ANTERIOR CRUCIATE LIGAMENT (ACL) RECONSTRUCTION WITH ALLOGRAFT (Right) KNEE ARTHROSCOPY WITH POSSIBLE LATERAL  MENISCAL REPAIR (Right: Knee)     Patient location during evaluation: PACU Anesthesia Type: Regional and General Level of consciousness: awake and alert Pain management: pain level controlled Vital Signs Assessment: post-procedure vital signs reviewed and stable Respiratory status: spontaneous breathing, nonlabored ventilation, respiratory function stable and patient connected to nasal cannula oxygen Cardiovascular status: blood pressure returned to baseline and stable Postop Assessment: no apparent nausea or vomiting Anesthetic complications: no Comments: Pt case was uneventful. In Pacu complained that we did not "believe him and were not adequately treating his pain" . 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 By  this point, he had received a adductor canal block (150mg  Ropiviacaine w clonidine), 20 Mcg of dexmedetomidine, of fentanyl, 50 mg of Ketamine, 5 mg of hydromorphone, 1g of acetaminophen, 30 mg of ketorolac and 5 mg of xycodone . Only after Dr Aundria Rud and I talked with him at bedside and offered him additional 10 mg of oxycodone did he perceive we were treating his pain.  No notable events documented.  Last Vitals:  Vitals:   12/23/23 1112 12/23/23 1115  BP:  (!) 161/83  Pulse: (!) 101 (!) 103  Resp: 14 (!) 23  Temp:    SpO2: 95% 94%    Last Pain:  Vitals:   12/23/23 1115  TempSrc:   PainSc: 4                  [name]

## 2023-12-24 DIAGNOSIS — G4733 Obstructive sleep apnea (adult) (pediatric): Secondary | ICD-10-CM | POA: Diagnosis not present

## 2023-12-26 ENCOUNTER — Encounter (HOSPITAL_COMMUNITY): Payer: Self-pay | Admitting: Orthopedic Surgery

## 2023-12-29 ENCOUNTER — Encounter: Payer: Self-pay | Admitting: Hematology

## 2023-12-29 ENCOUNTER — Telehealth: Payer: Self-pay

## 2023-12-29 ENCOUNTER — Other Ambulatory Visit (HOSPITAL_COMMUNITY): Payer: Self-pay

## 2023-12-29 NOTE — Telephone Encounter (Signed)
 Pharmacy Patient Advocate Encounter   Received notification from CoverMyMeds that prior authorization for [facility] 25MG  suppositories is required/requested.   Insurance verification completed.   The patient is insured through [facility] .   Per test claim: PA required; PA submitted to above mentioned insurance via CoverMyMeds Key/confirmation #/EOC [id number] Status is pending

## 2023-12-30 DIAGNOSIS — M25562 Pain in left knee: Secondary | ICD-10-CM | POA: Diagnosis not present

## 2024-01-03 NOTE — Telephone Encounter (Signed)
 Patient notified VIA phone and will pay out of pocket for it and will let us know if he changes his mind. Dm/cma

## 2024-01-03 NOTE — Telephone Encounter (Signed)
 Pharmacy Patient Advocate Encounter  Received notification from  [facility] MEDICARE PART D  that Prior Authorization for [facility] 25MG  suppositories has been DENIED.  See denial reason below. No denial letter attached in CMM. Will attach denial letter to Media tab once received.   PA #/Case ID/Reference #: (Key: [id number])

## 2024-01-05 DIAGNOSIS — M25561 Pain in right knee: Secondary | ICD-10-CM | POA: Diagnosis not present

## 2024-01-06 ENCOUNTER — Encounter: Payer: Self-pay | Admitting: Adult Health

## 2024-01-06 ENCOUNTER — Telehealth: Payer: 59 | Admitting: Adult Health

## 2024-01-06 DIAGNOSIS — F3181 Bipolar II disorder: Secondary | ICD-10-CM | POA: Diagnosis not present

## 2024-01-06 DIAGNOSIS — F4310 Post-traumatic stress disorder, unspecified: Secondary | ICD-10-CM | POA: Diagnosis not present

## 2024-01-06 DIAGNOSIS — F603 Borderline personality disorder: Secondary | ICD-10-CM | POA: Diagnosis not present

## 2024-01-06 DIAGNOSIS — F602 Antisocial personality disorder: Secondary | ICD-10-CM

## 2024-01-06 DIAGNOSIS — F6081 Narcissistic personality disorder: Secondary | ICD-10-CM

## 2024-01-06 MED ORDER — RISPERIDONE 0.5 MG PO TABS: 0.5000 mg | ORAL_TABLET | Freq: Every day | ORAL | 2 refills | Status: DC

## 2024-01-06 NOTE — Progress Notes (Signed)
 Troy [name] [id number] November 22, 1975 48 y.o.  Virtual Visit via Video Note  I connected with pt @ on 01/06/24 at 12:00 PM EDT by a video enabled telemedicine application and verified that I am speaking with the correct person using two identifiers.   I discussed the limitations of evaluation and management by telemedicine and the availability of in person appointments. The patient expressed understanding and agreed to proceed.  I discussed the assessment and treatment plan with the patient. The patient was provided an opportunity to ask questions and all were answered. The patient agreed with the plan and demonstrated an understanding of the instructions.   The patient was advised to call back or seek an in-person evaluation if the symptoms worsen or if the condition fails to improve as anticipated.  I provided 25 minutes of non-face-to-face time during this encounter.  The patient was located at home.  The provider was located at [facility].   Dorothyann Gibbs, NP   Subjective:   Patient ID:  DAIDEN [name] is a 48 y.o. (DOB Jul 16, 1976) male.  Chief Complaint: No chief complaint on file.   HPI ABDOU STOCKS presents for follow-up of BPD, BPD2, PTSD, antisocial personality disorder, narcisscitic PD.  Describes mood today as "better". Pleasant. Denies tearfulness. Mood symptoms - denies depression. Reports stable interest and motivation. Reports deceased anxiety. Reports decreased irritability - "got into an argument with sister". Denies panic attacks. Reports over thinking. Denies worry and rumination. Reports obsessions - has patterns - all in or all out. Feels like he is a person that's "all in or all out". Reports decreased mood swings. Stating "I have seen some improvement in my mood". Reports taking current medications as prescribed.  Energy levels lower. Active, has a regular exercise routine - power lifting. Enjoys some usual interests and activities. Single. Lives  alone in an apartment. Parents local. Has 2 sibling - estranged. Spending time with family. Appetite adequate.  Weight stable. Reports sleeping difficulties. Averages 4 to 8 hours. Recently got a CPAP machine. Reports focus and concentration difficulties. Completing tasks. Managing aspects of household. Reports he is permanently and totally disabled - strokes. Reports SI without a plan - ongoing issue since. Denies HI.  Denies AH or VH. Denies self harm - history of. Reports substance use smoking TCH, kratom and mushrooms. 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 Consumes alcohol 2 to 3 days a week - 5 to 6 beers.  Previous medication trials:  Trazadone, Gabapentin, Depakote, Wellbutrin, Zoloft, Sonata, Seroquel  Review of Systems:  Review of Systems  Musculoskeletal:  Negative for gait problem.  Neurological:  Negative for tremors.  Psychiatric/Behavioral:         Please refer to HPI   Medications: I have reviewed the patient's current medications.  Current Outpatient Medications  Medication Sig Dispense Refill   ammonium lactate (LAC-HYDRIN) 12 % lotion Apply 1 application topically as needed for dry skin.     aspirin 325 MG tablet Take 1 tablet (325 mg total) by mouth daily. 30 tablet 0   Azelastine HCl 137 MCG/SPRAY SOLN USE 1 SPRAY(S) IN EACH NOSTRIL ONCE DAILY AS DIRECTED 30 mL 5   baclofen (LIORESAL) 10 MG tablet Take 1 tablet by mouth three times daily as needed 30 tablet 5   bisoprolol (ZEBETA) 5 MG tablet TAKE 1 TABLET BY MOUTH AT BEDTIME 90 tablet 1   budesonide (PULMICORT) 0.5 MG/2ML nebulizer solution Mix with Xopenex nebulizer solution every 6 hours as  needed. 120 mL 2   clindamycin (CLEOCIN T) 1 % lotion Apply topically as needed (rash).     diphenhydrAMINE HCl (BENADRYL PO) Take by mouth.     doxylamine, Sleep, (UNISOM) 25 MG tablet Take 25 mg by mouth at bedtime as needed for sleep.     EPINEPHrine 0.3 mg/0.3 mL IJ SOAJ injection Inject 0.3 mg into the muscle  as needed for anaphylaxis. 2 each 2   esomeprazole (NEXIUM) 40 MG capsule TAKE 1 CAPSULE BY MOUTH TWICE DAILY BEFORE A MEAL 180 capsule 3   Evolocumab (REPATHA SURECLICK) 140 MG/ML SOAJ Inject 140 mg into the skin every 14 (fourteen) days. 6 mL 3   Fluticasone-Umeclidin-Vilant (TRELEGY ELLIPTA) 200-62.5-25 MCG/ACT AEPB INHALE 1 PUFF INTO LUNGS ONCE DAILY 60 each 5   furosemide (LASIX) 20 MG tablet Take 1 tablet (20 mg total) by mouth 3 (three) times a week. (Patient taking differently: Take 20 mg by mouth 3 (three) times a week. Rotating days (days he isn't weight lifting)) 36 tablet 3   gabapentin (NEURONTIN) 300 MG capsule TAKE 2 CAPSULES BY MOUTH IN THE MORNING AND 2 CAPSULES AT NOON AND 4 CAPSULES AT BEDTIME 240 capsule 3   hydrocortisone (ANUSOL-HC) 25 MG suppository Place 1 suppository (25 mg total) rectally 2 (two) times daily. 12 suppository 0   levalbuterol (XOPENEX HFA) 45 MCG/ACT inhaler INHALE 2 PUFFS BY MOUTH EVERY 4 HOURS AS NEEDED FOR WHEEZING 30 g 0   levalbuterol (XOPENEX) 1.25 MG/3ML nebulizer solution Take 1.25 mg by nebulization every 4 (four) hours as needed for wheezing. 120 mL 1   levothyroxine (SYNTHROID) 300 MCG tablet Take 300 mcg by mouth daily before breakfast.     LUER LOCK SAFETY SYRINGES 22G X 1-1/2" 3 ML MISC USE FOR SELF ADMINISTER OF TESTOSTERONE 2CC ONCE EVERY 2 WEEKS     Melatonin 10 MG TABS Take by mouth at bedtime.     Multiple Vitamin (MULTI-VITAMIN) tablet Take 1 tablet by mouth daily.     ondansetron (ZOFRAN) 4 MG tablet Take 1 tablet (4 mg total) by mouth every 8 (eight) hours as needed for vomiting or nausea. 20 tablet 0   risperiDONE (RISPERDAL) 0.5 MG tablet Take 1 tablet (0.5 mg total) by mouth at bedtime. 30 tablet 2   rosuvastatin (CRESTOR) 40 MG tablet Take 1 tablet by mouth once daily 90 tablet 3   sacubitril-valsartan (ENTRESTO) 49-51 MG Take 1 tablet by mouth 2 (two) times daily. 180 tablet 3   Suvorexant (BELSOMRA) 10 MG TABS TAKE 1 TABLET BY  MOUTH AT BEDTIME 30 tablet 5   tamsulosin (FLOMAX) 0.4 MG CAPS capsule Take 1 capsule by mouth once daily 90 capsule 3   testosterone cypionate (DEPOTESTOSTERONE CYPIONATE) 200 MG/ML injection Inject 0.6 mLs (120 mg total) into the muscle once a week. (Patient taking differently: Inject 120 mg into the muscle once a week. Fridays or Saturdays) 10 mL 0   traZODone (DESYREL) 150 MG tablet TAKE 2 TABLETS BY MOUTH AT BEDTIME 180 tablet 3   triamcinolone (NASACORT ALLERGY 24HR) 55 MCG/ACT AERO nasal inhaler Place 2 sprays into the nose daily.     No current facility-administered medications for this visit.    Medication Side Effects: None  Allergies:  Allergies  Allergen Reactions   Ciprofloxacin Other (See Comments)    Spontaneous rupture of tendon   Earnestine Leys Officinalis] Anaphylaxis and Hives   Fluocinolone Other (See Comments)    "spontaneous rupture of tendons" per pt dr.   Dapagliflozin Nausea  And Vomiting    abscess  Patient refuses to give further information.   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   CHF (congestive heart failure) (HCC)    Chronic fatigue    Depression    Drug abuse (HCC)    Ectopic atrial rhythm    Family history of adverse reaction to anesthesia    Dad  has problem with lots of medications   Family history of breast cancer 05/19/2021   Family history of colon cancer in mother    GERD (gastroesophageal reflux disease)    +Barretts   Hematochezia    hemorrhoids   Hepatic steatosis    +hepatomegaly   History of CVA (cerebrovascular accident)    radiation-induced carotid damage/changes   History of hepatitis C    +treated   History of hiatal hernia    History of Hodgkin's lymphoma    age 88->mantle RT + splenectomy   History of splenectomy    Hodgkin's lymphoma  (HCC)    Hyperlipidemia    Hypertension    Insomnia    Male hypogonadism    Moderate persistent asthma    Obesity, Class I, BMI 30-34.9    Papillary thyroid carcinoma (HCC) 2010   total thyroidectomy + neck dissection   Pneumonia    Postsurgical hypothyroidism    Prediabetes    Pulmonary fibrosis (HCC)    ?rad induced?   Sleep apnea    mild  cpap   Stroke [facility])    x2  2016  october no defecits memory affected   Thyroid ca [facility])     Family History  Problem Relation Age of Onset   Breast cancer Mother        dx early 49s   Colon cancer Mother        dx early 34s   Other Mother        NTHL1 homozygous   Sleep apnea Mother    Parkinsonism Father    Multiple myeloma Father        dx 48s   Sleep apnea Father    Breast cancer Maternal Aunt    Stroke Maternal Grandmother    Leukemia Other        dx <20; two paternal first cousins once removed    Social History   Socioeconomic History   Marital status: Single    Spouse name: Not on file   Number of children: 0   Years of education: Not on file   Highest education level: Not on file  Occupational History   Not on file  Tobacco Use   Smoking status: Former    Current packs/day: 0.00    Types: Cigarettes    Quit date: 2013    Years since quitting: 12.2   Smokeless tobacco: Never  Vaping Use   Vaping status: Some Days  Substance and Sexual Activity   Alcohol use: Not Currently    Comment: occasionally   Drug use: Yes    Types: Marijuana    Comment: pt states "I'm uncomfortable answering this question"   Sexual activity: Yes  Other Topics Concern   Not on file  Social History Narrative   Are you right handed or left handed? Right   Are you currently  employed ? No   What is your current occupation? None   Do you live at home alone? yes   Who lives with you? no   What type of home do you live in: 1 story or 2 story? Third floor apratment      "10 years I had a heroin addiction, and sometimes I need more  medicines"         Social Drivers of Corporate investment banker Strain: Not on file  Food Insecurity: Low Risk  (10/24/2023)   Received from [facility]   Hunger Vital Sign    Worried About Running Out of Food in the Last Year: Never true    Ran Out of Food in the Last Year: Never true  Transportation Needs: No Transportation Needs (10/24/2023)   Received from [facility]    In the past 12 months, has lack of reliable transportation kept you from medical appointments, meetings, work or from getting things needed for daily living? : No  Physical Activity: Not on file  Stress: Not on file  Social Connections: Not on file  Intimate Partner Violence: Not on file    Past Medical History, Surgical history, Social history, and Family history were reviewed and updated as appropriate.   Please see review of systems for further details on the patient's review from today.   Objective:   Physical Exam:  There were no vitals taken for this visit.  Physical Exam Constitutional:      General: He is not in acute distress. Musculoskeletal:        General: No deformity.  Neurological:     Mental Status: He is alert and oriented to person, place, and time.     Coordination: Coordination normal.  Psychiatric:        Attention and Perception: Attention and perception normal. He does not perceive auditory or visual hallucinations.        Mood and Affect: Affect is not labile, blunt, angry or inappropriate.        Speech: Speech normal.        Behavior: Behavior normal.        Thought Content: Thought content normal. Thought content is not paranoid or delusional. Thought content does not include homicidal or suicidal ideation. Thought content does not include homicidal or suicidal plan.        Cognition and Memory: Cognition and memory normal.        Judgment: Judgment normal.     Comments: Insight intact     Lab Review:     Component Value Date/Time   NA 136  12/14/2023 1503   NA 147 (H) 09/12/2023 1026   NA 134 (L) 12/10/2013 2132   K 4.7 12/14/2023 1503   K 4.5 12/10/2013 2132   CL 104 12/14/2023 1503   CL 104 12/10/2013 2132   CO2 22 12/14/2023 1503   CO2 26 12/10/2013 2132   GLUCOSE 92 12/14/2023 1503   GLUCOSE 86 12/10/2013 2132   BUN 16 12/14/2023 1503   BUN 19 09/12/2023 1026   BUN 15 12/10/2013 2132   CREATININE 0.92 12/14/2023 1503   CREATININE 1.14 12/10/2013 2132   CALCIUM 8.3 (L) 12/14/2023 1503   CALCIUM 8.2 (L) 12/10/2013 2132   PROT 7.0 09/12/2023 1026   PROT 7.8 12/10/2013 2132   ALBUMIN 4.3 09/12/2023 1026   ALBUMIN 3.5 12/10/2013 2132   AST 67 (H) 09/12/2023 1026   AST 30 12/10/2013 2132   ALT 107 (  H) 09/12/2023 1026   ALT 32 12/10/2013 2132   ALKPHOS 169 (H) 09/12/2023 1026   ALKPHOS 47 12/10/2013 2132   BILITOT 0.5 09/12/2023 1026   BILITOT 0.3 12/10/2013 2132   GFRNONAA >60 12/14/2023 1503   GFRNONAA >60 12/10/2013 2132   GFRAA >60 12/19/2018 1152   GFRAA >60 12/10/2013 2132       Component Value Date/Time   WBC 9.7 12/14/2023 1503   RBC 4.77 12/14/2023 1503   HGB 16.0 12/14/2023 1503   HGB 17.3 09/05/2023 1017   HCT 49.2 12/14/2023 1503   HCT 49.9 09/05/2023 1017   PLT 210 12/14/2023 1503   PLT 227 09/05/2023 1017   MCV 103.1 (H) 12/14/2023 1503   MCV 96 09/05/2023 1017   MCV 93 12/10/2013 2132   MCH 33.5 12/14/2023 1503   MCHC 32.5 12/14/2023 1503   RDW 17.2 (H) 12/14/2023 1503   RDW 17.2 (H) 09/05/2023 1017   RDW 16.0 (H) 12/10/2013 2132   LYMPHSABS 1.7 08/06/2023 1514   LYMPHSABS 3.3 (H) 08/12/2020 1436   MONOABS 1.7 (H) 08/06/2023 1514   EOSABS 0.1 08/06/2023 1514   EOSABS 0.5 (H) 08/12/2020 1436   BASOSABS 0.1 08/06/2023 1514   BASOSABS 0.1 08/12/2020 1436    No results found for: "POCLITH", "LITHIUM"   No results found for: "PHENYTOIN", "PHENOBARB", "VALPROATE", "CBMZ"   .res Assessment: Plan:    Treatment Plan/Recommendations:   Plan:  PDMP reviewed  Add Risperdal  0.5mg  at bedtime  RTC 4 weeks  25 minutes spent dedicated to the care of this patient on the date of this encounter to include pre-visit review of records, ordering of medication, post visit documentation, and face-to-face time with the patient discussing BPD, BPD2, PTSD, antisocial personality disorder, narcisscitic PD. Discussed adding Risperdal 0.5mg  at bedtime.   Patient advised to contact office with any questions, adverse effects, or acute worsening in signs and symptoms.  There are no diagnoses linked to this encounter.   Please see After Visit Summary for patient specific instructions.  Future Appointments  Date Time Provider Department Center  01/06/2024 12:00 PM Margaret Staggs, Thereasa Solo, NP [facility] None  02/07/2024  1:30 PM [facility] LAB [facility] None  02/07/2024  2:00 PM Malachy Mood, MD [facility] None  02/07/2024  3:00 PM [facility] INFUSION [facility] None  05/17/2024 11:00 AM Alfonse Spruce, MD [facility] None    No orders of the defined types were placed in this encounter.     -------------------------------

## 2024-01-12 DIAGNOSIS — M25561 Pain in right knee: Secondary | ICD-10-CM | POA: Diagnosis not present

## 2024-01-15 ENCOUNTER — Other Ambulatory Visit: Payer: Self-pay | Admitting: Family Medicine

## 2024-01-15 DIAGNOSIS — K219 Gastro-esophageal reflux disease without esophagitis: Secondary | ICD-10-CM

## 2024-01-18 DIAGNOSIS — M25561 Pain in right knee: Secondary | ICD-10-CM | POA: Diagnosis not present

## 2024-01-22 ENCOUNTER — Other Ambulatory Visit: Payer: Self-pay | Admitting: Family Medicine

## 2024-01-22 DIAGNOSIS — G4733 Obstructive sleep apnea (adult) (pediatric): Secondary | ICD-10-CM | POA: Diagnosis not present

## 2024-01-23 DIAGNOSIS — M25561 Pain in right knee: Secondary | ICD-10-CM | POA: Diagnosis not present

## 2024-01-25 ENCOUNTER — Encounter: Payer: Self-pay | Admitting: Hematology

## 2024-01-25 ENCOUNTER — Other Ambulatory Visit (HOSPITAL_BASED_OUTPATIENT_CLINIC_OR_DEPARTMENT_OTHER): Payer: Self-pay

## 2024-01-25 MED ORDER — HYDROCORTISONE ACETATE 25 MG RE SUPP
25.0000 mg | Freq: Two times a day (BID) | RECTAL | 0 refills | Status: DC
Start: 2024-01-25 — End: 2024-07-19
  Filled 2024-01-25: qty 12, 6d supply, fill #0

## 2024-01-25 NOTE — Addendum Note (Signed)
 Addended by: Graig Lawyer on: 01/25/2024 08:10 AM   Modules accepted: Orders

## 2024-01-26 DIAGNOSIS — M25561 Pain in right knee: Secondary | ICD-10-CM | POA: Diagnosis not present

## 2024-01-31 DIAGNOSIS — M25561 Pain in right knee: Secondary | ICD-10-CM | POA: Diagnosis not present

## 2024-02-01 ENCOUNTER — Telehealth: Payer: Self-pay | Admitting: Adult Health

## 2024-02-01 NOTE — Telephone Encounter (Signed)
 There was no message. Called and LVM to [facility] to see what was needed.

## 2024-02-01 NOTE — Telephone Encounter (Signed)
 Shant would like to increase his Risperidone  to 1 mg.Thinks it will make him feel better.  Phone:[phone]

## 2024-02-02 ENCOUNTER — Other Ambulatory Visit: Payer: Self-pay

## 2024-02-02 DIAGNOSIS — G4733 Obstructive sleep apnea (adult) (pediatric): Secondary | ICD-10-CM | POA: Diagnosis not present

## 2024-02-02 DIAGNOSIS — M25561 Pain in right knee: Secondary | ICD-10-CM | POA: Diagnosis not present

## 2024-02-02 MED ORDER — RISPERIDONE 1 MG PO TABS: 1.0000 mg | ORAL_TABLET | Freq: Every day | ORAL | 0 refills | Status: DC

## 2024-02-02 NOTE — Telephone Encounter (Signed)
 Pt had a video visit on 3/28 and was started on risperidone  0.5 mg. He said if needed he could increase. He is reporting life has gotten crazy and he is overwhelmed and would like to increase to 1 mg. Will send if ok.

## 2024-02-02 NOTE — Telephone Encounter (Signed)
 Rx sent for risperidone  1 mg to [facility]. Patient aware.

## 2024-02-05 ENCOUNTER — Other Ambulatory Visit: Payer: Self-pay | Admitting: Family Medicine

## 2024-02-05 DIAGNOSIS — F5104 Psychophysiologic insomnia: Secondary | ICD-10-CM

## 2024-02-06 ENCOUNTER — Other Ambulatory Visit: Payer: Self-pay

## 2024-02-06 DIAGNOSIS — D751 Secondary polycythemia: Secondary | ICD-10-CM

## 2024-02-06 DIAGNOSIS — M25561 Pain in right knee: Secondary | ICD-10-CM | POA: Diagnosis not present

## 2024-02-07 ENCOUNTER — Inpatient Hospital Stay: Attending: Hematology

## 2024-02-07 ENCOUNTER — Inpatient Hospital Stay (HOSPITAL_BASED_OUTPATIENT_CLINIC_OR_DEPARTMENT_OTHER): Admitting: Hematology

## 2024-02-07 ENCOUNTER — Inpatient Hospital Stay

## 2024-02-07 VITALS — BP 132/68 | HR 92 | Temp 98.3°F | Resp 18 | Ht 72.0 in | Wt 213.6 lb

## 2024-02-07 DIAGNOSIS — Z79899 Other long term (current) drug therapy: Secondary | ICD-10-CM | POA: Diagnosis not present

## 2024-02-07 DIAGNOSIS — Z8639 Personal history of other endocrine, nutritional and metabolic disease: Secondary | ICD-10-CM | POA: Diagnosis not present

## 2024-02-07 DIAGNOSIS — D751 Secondary polycythemia: Secondary | ICD-10-CM

## 2024-02-07 LAB — CBC WITH DIFFERENTIAL (CANCER CENTER ONLY)
Abs Immature Granulocytes: 0.06 10*3/uL (ref 0.00–0.07)
Basophils Absolute: 0.1 10*3/uL (ref 0.0–0.1)
Basophils Relative: 1 %
Eosinophils Absolute: 0.3 10*3/uL (ref 0.0–0.5)
Eosinophils Relative: 2 %
HCT: 46.3 % (ref 39.0–52.0)
Hemoglobin: 16.6 g/dL (ref 13.0–17.0)
Immature Granulocytes: 1 %
Lymphocytes Relative: 11 %
Lymphs Abs: 1.4 10*3/uL (ref 0.7–4.0)
MCH: 33.8 pg (ref 26.0–34.0)
MCHC: 35.9 g/dL (ref 30.0–36.0)
MCV: 94.3 fL (ref 80.0–100.0)
Monocytes Absolute: 1.6 10*3/uL — ABNORMAL HIGH (ref 0.1–1.0)
Monocytes Relative: 12 %
Neutro Abs: 9.8 10*3/uL — ABNORMAL HIGH (ref 1.7–7.7)
Neutrophils Relative %: 73 %
Platelet Count: 233 10*3/uL (ref 150–400)
RBC: 4.91 MIL/uL (ref 4.22–5.81)
RDW: 17.2 % — ABNORMAL HIGH (ref 11.5–15.5)
WBC Count: 13.2 10*3/uL — ABNORMAL HIGH (ref 4.0–10.5)
nRBC: 0 % (ref 0.0–0.2)

## 2024-02-07 LAB — FERRITIN: Ferritin: 167 ng/mL (ref 24–336)

## 2024-02-07 NOTE — Progress Notes (Signed)
 [facility]   [phone] [phone]   Clinic Follow up Note   Patient Care Team: Graig Lawyer, MD as PCP - General (Family Medicine) Jann Melody, MD as PCP - Cardiology (Cardiology) Edra Govern, MD as Consulting Physician (Endocrinology) Annamarie Kid, MD (Inactive) as Consulting Physician (Urology) Idolina Maker Mirna Amis, MD as Consulting Physician (Allergy and Immunology) Remonia Carmin, Galen Judge, MD as Referring Physician (Dermatology) Sonja , MD as Attending Physician (Hematology and Oncology) Jann Melody, MD as Consulting Physician (Cardiology)  Date of Service:  02/07/2024  CHIEF COMPLAINT: f/u of polycythemia  CURRENT THERAPY:  Phlebotomy if hematocrit more than 50%  Assessment & Plan 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 Current hematocrit is 46.3%, within acceptable range. Previous hematocrit in March was 49%. Variability in hematocrit levels may be due to hydration status and timing of testosterone  injections. He is on a low dose of testosterone  (120 mg) and reports inability to lower the dose further without affecting functionality. No significant bleeding reported, only minor hemorrhoidal bleeding. Advised to avoid iron supplements and consider dietary adjustments to manage iron intake, though dietary changes are not critical. Phlebotomy is indicated if hematocrit exceeds 50%. - Continue testosterone  120 mg weekly. - Avoid iron supplements. - Schedule phlebotomy every four months or as needed based on hematocrit levels. - Order lab work every six months to monitor hematocrit levels. - Schedule follow-up in 18 months.  Plan - Lab reviewed, no need phlebotomy today - Continue CBC and phlebotomy every 6 months as needed if hematocrit more than 50% - Follow-up in 18 months   Discussed the use of AI scribe software for clinical note  transcription with the patient, who gave verbal consent to proceed.  History of Present Illness Troy [name] is a 48 year old male with secondary polycythemia who presents for follow-up regarding his condition.  He is on a weekly testosterone  injection regimen of 120 mg, which is the minimum dose that maintains his functionality. His hematocrit levels have decreased from 49% to 46.3% without intervention. He maintains hydration and questions if dehydration during previous tests could have affected results. His blood counts typically rise after injections but should be lower by this visit, as his last injection was the previous Thursday.  He experiences minor bleeding from a hemorrhoid but does not believe it affects his blood counts. He denies taking iron supplements and finds dietary changes challenging due to meal preparation habits.  He has undergone phlebotomy once and is open to continuing this every four months if necessary. There have been no significant changes in his health or prescription medications recently.     All other systems were reviewed with the patient and are negative.  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   CHF (congestive heart failure) (HCC)    Chronic fatigue    Depression    Drug abuse (HCC)    Ectopic atrial rhythm    Family history of adverse reaction to anesthesia    Dad  has problem with lots of medications   Family history of breast cancer 05/19/2021   Family history of colon cancer in mother    GERD (gastroesophageal reflux disease)    +Barretts  Hematochezia    hemorrhoids   Hepatic steatosis    +hepatomegaly   History of CVA (cerebrovascular accident)    radiation-induced carotid damage/changes   History of hepatitis C    +treated   History of hiatal hernia     History of Hodgkin's lymphoma    age 40->mantle RT + splenectomy   History of splenectomy    Hodgkin's lymphoma (HCC)    Hyperlipidemia    Hypertension    Insomnia    Male hypogonadism    Moderate persistent asthma    Obesity, Class I, BMI 30-34.9    Papillary thyroid  carcinoma (HCC) 2010   total thyroidectomy + neck dissection   Pneumonia    Postsurgical hypothyroidism    Prediabetes    Pulmonary fibrosis (HCC)    ?rad induced?   Sleep apnea    mild  cpap   Stroke [facility])    x2  2016  october no defecits memory affected   Thyroid  ca [facility])     SURGICAL HISTORY: Past Surgical History:  Procedure Laterality Date   COLONOSCOPY  07/04/2019   done for hematochezia and FH col ca-mother: Mod int hem w/inflamm.  No adenomatous polyps.   INCISIONAL HERNIA REPAIR  06/2019   RUQ (prev lap append site)   KNEE ARTHROSCOPY WITH MENISCAL REPAIR Right 12/23/2023   Procedure: KNEE ARTHROSCOPY WITH POSSIBLE LATERAL  MENISCAL REPAIR;  Surgeon: Janeth Medicus, MD;  Location: [facility];  Service: Orthopedics;  Laterality: Right;   LAPAROSCOPIC APPENDECTOMY N/A 03/25/2017   Procedure: APPENDECTOMY LAPAROSCOPIC; EXTENSIVE LYSIS OF ADHESIONS FROM PRIOR RADIATION AND SURGERY;  Surgeon: Jacolyn Matar, MD;  Location: [facility];  Service: General;  Laterality: N/A;   MENISCUS REPAIR Left 09/16/2023   Procedure: REPAIR OF LATERAL MENISCUS;  Surgeon: Janeth Medicus, MD;  Location: [facility];  Service: Orthopedics;  Laterality: Left;  120 block in preop   PARATHYROIDECTOMY     SPLENECTOMY     TEE WITHOUT CARDIOVERSION N/A 07/18/2015   Procedure: TRANSESOPHAGEAL ECHOCARDIOGRAM (TEE);  Surgeon: Lake Pilgrim, MD;  Location: [facility];  Service: Cardiovascular;  Laterality: N/A;   THYROIDECTOMY      I have reviewed the social history and family history with the patient and they are unchanged from previous note.  ALLERGIES:  is allergic to ciprofloxacin, sage [salvia  officinalis], fluocinolone, dapagliflozin , and prednisone .  MEDICATIONS:  Current Outpatient Medications  Medication Sig Dispense Refill   ammonium lactate (LAC-HYDRIN) 12 % lotion Apply 1 application topically as needed for dry skin.     aspirin  325 MG tablet Take 1 tablet (325 mg total) by mouth daily. 30 tablet 0   Azelastine  HCl 137 MCG/SPRAY SOLN USE 1 SPRAY(S) IN EACH NOSTRIL ONCE DAILY AS DIRECTED 30 mL 5   baclofen  (LIORESAL ) 10 MG tablet Take 1 tablet by mouth three times daily as needed 30 tablet 5   BELSOMRA  10 MG TABS TAKE 1 TABLET BY MOUTH AT BEDTIME 30 tablet 0   bisoprolol  (ZEBETA ) 5 MG tablet TAKE 1 TABLET BY MOUTH AT BEDTIME 90 tablet 1   budesonide  (PULMICORT ) 0.5 MG/2ML nebulizer solution Mix with Xopenex  nebulizer solution every 6 hours as needed. 120 mL 2   clindamycin  (CLEOCIN  T) 1 % lotion Apply topically as needed (rash).     diphenhydrAMINE  HCl (BENADRYL  PO) Take by mouth.     doxylamine, Sleep, (UNISOM) 25 MG tablet Take 25 mg by mouth at bedtime as needed for sleep.     EPINEPHrine  0.3 mg/0.3  mL IJ SOAJ injection Inject 0.3 mg into the muscle as needed for anaphylaxis. 2 each 2   esomeprazole  (NEXIUM ) 40 MG capsule TAKE 1 CAPSULE BY MOUTH TWICE DAILY BEFORE A MEAL 180 capsule 3   Evolocumab  (REPATHA  SURECLICK) 140 MG/ML SOAJ Inject 140 mg into the skin every 14 (fourteen) days. 6 mL 3   Fluticasone -Umeclidin-Vilant (TRELEGY ELLIPTA ) 200-62.5-25 MCG/ACT AEPB INHALE 1 PUFF INTO LUNGS ONCE DAILY 60 each 5   furosemide  (LASIX ) 20 MG tablet Take 1 tablet (20 mg total) by mouth 3 (three) times a week. (Patient taking differently: Take 20 mg by mouth 3 (three) times a week. Rotating days (days he isn't weight lifting)) 36 tablet 3   gabapentin  (NEURONTIN ) 300 MG capsule TAKE 2 CAPSULES BY MOUTH IN THE MORNING AND 2 CAPSULES AT NOON AND 4 CAPSULES AT BEDTIME 240 capsule 3   hydrocortisone  (ANUSOL -HC) 25 MG suppository Place 1 suppository (25 mg total) rectally 2 (two) times  daily. 12 suppository 0   levalbuterol  (XOPENEX  HFA) 45 MCG/ACT inhaler INHALE 2 PUFFS BY MOUTH EVERY 4 HOURS AS NEEDED FOR WHEEZING 30 g 0   levalbuterol  (XOPENEX ) 1.25 MG/3ML nebulizer solution Take 1.25 mg by nebulization every 4 (four) hours as needed for wheezing. 120 mL 1   levothyroxine  (SYNTHROID ) 300 MCG tablet Take 300 mcg by mouth daily before breakfast.     LUER LOCK SAFETY SYRINGES 22G X 1-1/2" 3 ML MISC USE FOR SELF ADMINISTER OF TESTOSTERONE  2CC ONCE EVERY 2 WEEKS     Melatonin 10 MG TABS Take by mouth at bedtime.     Multiple Vitamin (MULTI-VITAMIN) tablet Take 1 tablet by mouth daily.     ondansetron  (ZOFRAN ) 4 MG tablet Take 1 tablet (4 mg total) by mouth every 8 (eight) hours as needed for vomiting or nausea. 20 tablet 0   risperiDONE  (RISPERDAL ) 1 MG tablet Take 1 tablet (1 mg total) by mouth at bedtime. 30 tablet 0   rosuvastatin  (CRESTOR ) 40 MG tablet Take 1 tablet by mouth once daily 90 tablet 3   sacubitril-valsartan (ENTRESTO ) 49-51 MG Take 1 tablet by mouth 2 (two) times daily. 180 tablet 3   tamsulosin  (FLOMAX ) 0.4 MG CAPS capsule Take 1 capsule by mouth once daily 90 capsule 3   testosterone  cypionate (DEPOTESTOSTERONE CYPIONATE) 200 MG/ML injection Inject 0.6 mLs (120 mg total) into the muscle once a week. (Patient taking differently: Inject 120 mg into the muscle once a week. Fridays or Saturdays) 10 mL 0   traZODone  (DESYREL ) 150 MG tablet TAKE 2 TABLETS BY MOUTH AT BEDTIME 180 tablet 3   triamcinolone  (NASACORT ALLERGY 24HR) 55 MCG/ACT AERO nasal inhaler Place 2 sprays into the nose daily.     No current facility-administered medications for this visit.    PHYSICAL EXAMINATION: ECOG PERFORMANCE STATUS: 0 - Asymptomatic  Vitals:   02/07/24 1404 02/07/24 1406  BP: (!) 150/82 132/68  Pulse: 92   Resp: 18   Temp: 98.3 F (36.8 C)   SpO2: 97%    Wt Readings from Last 3 Encounters:  02/07/24 213 lb 9.6 oz (96.9 kg)  12/23/23 210 lb (95.3 kg)  12/14/23 210  lb (95.3 kg)     GENERAL:alert, no distress and comfortable SKIN: skin color, texture, turgor are normal, no rashes or significant lesions EYES: normal, Conjunctiva are pink and non-injected, sclera clear Musculoskeletal:no cyanosis of digits and no clubbing  NEURO: alert & oriented x 3 with fluent speech, no focal motor/sensory deficits  Physical Exam  LABORATORY DATA:  I have reviewed the data as listed    Latest Ref Rng & Units 02/07/2024    1:36 PM 12/14/2023    3:03 PM 09/05/2023   10:17 AM  CBC  WBC 4.0 - 10.5 K/uL 13.2  9.7  11.6   Hemoglobin 13.0 - 17.0 g/dL 16.1  09.6  04.5   Hematocrit 39.0 - 52.0 % 46.3  49.2  49.9   Platelets 150 - 400 K/uL 233  210  227         Latest Ref Rng & Units 12/14/2023    3:03 PM 09/16/2023    7:44 AM 09/14/2023    5:59 PM  CMP  Glucose 70 - 99 mg/dL 92  409  811   BUN 6 - 20 mg/dL 16  23  20    Creatinine 0.61 - 1.24 mg/dL 9.14  7.82  9.56   Sodium 135 - 145 mmol/L 136  133  135   Potassium 3.5 - 5.1 mmol/L 4.7  4.1  5.3   Chloride 98 - 111 mmol/L 104  104  104   CO2 22 - 32 mmol/L 22  20  24    Calcium  8.9 - 10.3 mg/dL 8.3  8.9  8.6       RADIOGRAPHIC STUDIES: I have personally reviewed the radiological images as listed and agreed with the findings in the report. No results found.    No orders of the defined types were placed in this encounter.  All questions were answered. The patient knows to call the clinic with any problems, questions or concerns. No barriers to learning was detected. The total time spent in the appointment was 15 minutes.     Sonja Vanderbilt, MD 02/07/2024

## 2024-02-08 ENCOUNTER — Ambulatory Visit (HOSPITAL_COMMUNITY)
Admission: RE | Admit: 2024-02-08 | Discharge: 2024-02-08 | Disposition: A | Discharge status: Home / Self Care | Source: Ambulatory Visit | Attending: Vascular Surgery | Admitting: Vascular Surgery

## 2024-02-08 ENCOUNTER — Other Ambulatory Visit (HOSPITAL_BASED_OUTPATIENT_CLINIC_OR_DEPARTMENT_OTHER): Payer: Self-pay | Admitting: Orthopedic Surgery

## 2024-02-08 DIAGNOSIS — M7989 Other specified soft tissue disorders: Secondary | ICD-10-CM | POA: Diagnosis not present

## 2024-02-08 DIAGNOSIS — M79604 Pain in right leg: Secondary | ICD-10-CM | POA: Diagnosis not present

## 2024-02-09 DIAGNOSIS — M25561 Pain in right knee: Secondary | ICD-10-CM | POA: Diagnosis not present

## 2024-02-10 ENCOUNTER — Other Ambulatory Visit: Payer: Self-pay | Admitting: Internal Medicine

## 2024-02-10 ENCOUNTER — Encounter: Payer: Self-pay | Admitting: Dermatology

## 2024-02-13 ENCOUNTER — Other Ambulatory Visit: Payer: Self-pay

## 2024-02-13 DIAGNOSIS — L739 Follicular disorder, unspecified: Secondary | ICD-10-CM

## 2024-02-13 DIAGNOSIS — M25561 Pain in right knee: Secondary | ICD-10-CM | POA: Diagnosis not present

## 2024-02-13 MED ORDER — CLINDAMYCIN PHOSPHATE 1 % EX LOTN: TOPICAL_LOTION | CUTANEOUS | 6 refills | Status: AC | PRN

## 2024-02-13 MED ORDER — DOXYCYCLINE HYCLATE 100 MG PO TABS: 100.0000 mg | ORAL_TABLET | Freq: Every day | ORAL | 4 refills | Status: AC

## 2024-02-13 NOTE — Telephone Encounter (Signed)
 Please send refills for doxy and clinda.  Thanks!

## 2024-02-16 DIAGNOSIS — M25561 Pain in right knee: Secondary | ICD-10-CM | POA: Diagnosis not present

## 2024-02-21 DIAGNOSIS — M25561 Pain in right knee: Secondary | ICD-10-CM | POA: Diagnosis not present

## 2024-02-23 DIAGNOSIS — M25561 Pain in right knee: Secondary | ICD-10-CM | POA: Diagnosis not present

## 2024-02-26 ENCOUNTER — Other Ambulatory Visit: Payer: Self-pay | Admitting: Adult Health

## 2024-02-27 DIAGNOSIS — M25561 Pain in right knee: Secondary | ICD-10-CM | POA: Diagnosis not present

## 2024-03-01 DIAGNOSIS — M25561 Pain in right knee: Secondary | ICD-10-CM | POA: Diagnosis not present

## 2024-03-03 DIAGNOSIS — G4733 Obstructive sleep apnea (adult) (pediatric): Secondary | ICD-10-CM | POA: Diagnosis not present

## 2024-03-06 DIAGNOSIS — M25561 Pain in right knee: Secondary | ICD-10-CM | POA: Diagnosis not present

## 2024-03-07 ENCOUNTER — Telehealth: Payer: Self-pay | Admitting: Family Medicine

## 2024-03-07 NOTE — Telephone Encounter (Signed)
 Form re-faxed.  Dm/cma

## 2024-03-07 NOTE — Telephone Encounter (Signed)
 Copied from CRM [id number]. Topic: General - Other >> Mar 06, 2024  4:43 PM Baldo Levan wrote: Reason for CRM: Bettylou Brunner from [facility] is calling in stating they they have not received the signed order for the Cpap supplies back, I informed him it was faxed back on 05/15, he is asking for this to please be faxed again to [phone].

## 2024-03-08 DIAGNOSIS — M25561 Pain in right knee: Secondary | ICD-10-CM | POA: Diagnosis not present

## 2024-03-09 ENCOUNTER — Other Ambulatory Visit: Payer: Self-pay | Admitting: Family Medicine

## 2024-03-09 DIAGNOSIS — G63 Polyneuropathy in diseases classified elsewhere: Secondary | ICD-10-CM

## 2024-03-09 NOTE — Telephone Encounter (Signed)
 Left detailed VM to rtn call to schedule a f/u appt. Dm/cma

## 2024-03-12 DIAGNOSIS — M25561 Pain in right knee: Secondary | ICD-10-CM | POA: Diagnosis not present

## 2024-03-12 NOTE — Telephone Encounter (Signed)
 Patient scheduled for appt on 03/27/24 @ 3:00 pm.  Patient agreeable.  Dm/cma

## 2024-03-15 DIAGNOSIS — M25561 Pain in right knee: Secondary | ICD-10-CM | POA: Diagnosis not present

## 2024-03-18 ENCOUNTER — Other Ambulatory Visit: Payer: Self-pay | Admitting: Family

## 2024-03-18 DIAGNOSIS — F5104 Psychophysiologic insomnia: Secondary | ICD-10-CM

## 2024-03-19 ENCOUNTER — Encounter: Payer: Self-pay | Admitting: Family Medicine

## 2024-03-19 ENCOUNTER — Other Ambulatory Visit: Payer: Self-pay | Admitting: Family Medicine

## 2024-03-19 DIAGNOSIS — F5104 Psychophysiologic insomnia: Secondary | ICD-10-CM

## 2024-03-19 NOTE — Telephone Encounter (Unsigned)
 Copied from CRM [id number]. Topic: Clinical - Medication Refill >> Mar 19, 2024  2:43 PM Peg Bouton F wrote: Medication: BELSOMRA  10 MG TABS  Pt called in and states he waited too long to get his sleep medication refilled and went to the pharmacy to pick it up but there were no refills.  Wants to know if he can get this refilled today because he will not have any tonight.   Has the patient contacted their pharmacy? Yes (Agent: If no, request that the patient contact the pharmacy for the refill. If patient does not wish to contact the pharmacy document the reason why and proceed with request.) (Agent: If yes, when and what did the pharmacy advise?)  This is the patient's preferred pharmacy:  [id number] [place] [address] [place] ([place]) [place] [address] Phone: [phone] Fax: [phone]   Is this the correct pharmacy for this prescription? Yes If no, delete pharmacy and type the correct one.   Has the prescription been filled recently? No  Is the patient out of the medication? Yes, is asking for this to be done today because he will not have sleep medication for tonight.   Has the patient been seen for an appointment in the last year OR does the patient have an upcoming appointment? Yes  Can we respond through MyChart? Yes or call [phone]  Agent: Please be advised that Rx refills may take up to 3 business days. We ask that you follow-up with your pharmacy.

## 2024-03-20 DIAGNOSIS — M25561 Pain in right knee: Secondary | ICD-10-CM | POA: Diagnosis not present

## 2024-03-20 MED ORDER — BELSOMRA 10 MG PO TABS: 1.0000 | ORAL_TABLET | Freq: Every day | ORAL | 0 refills | Status: DC

## 2024-03-20 NOTE — Telephone Encounter (Signed)
 Refill request for  Belsomria 10 mg LR  02/06/24, #30, 0 rf FOV  03/27/24  Please review and advise.  Thanks.Dm/cma

## 2024-03-22 DIAGNOSIS — M25561 Pain in right knee: Secondary | ICD-10-CM | POA: Diagnosis not present

## 2024-03-26 DIAGNOSIS — M25561 Pain in right knee: Secondary | ICD-10-CM | POA: Diagnosis not present

## 2024-03-27 ENCOUNTER — Encounter: Payer: Self-pay | Admitting: Family Medicine

## 2024-03-27 ENCOUNTER — Ambulatory Visit (INDEPENDENT_AMBULATORY_CARE_PROVIDER_SITE_OTHER): Admitting: Family Medicine

## 2024-03-27 VITALS — BP 130/76 | HR 96 | Temp 97.9°F | Ht 72.0 in | Wt 217.8 lb

## 2024-03-27 DIAGNOSIS — M545 Low back pain, unspecified: Secondary | ICD-10-CM

## 2024-03-27 DIAGNOSIS — I5020 Unspecified systolic (congestive) heart failure: Secondary | ICD-10-CM | POA: Diagnosis not present

## 2024-03-27 DIAGNOSIS — I10 Essential (primary) hypertension: Secondary | ICD-10-CM

## 2024-03-27 DIAGNOSIS — G8929 Other chronic pain: Secondary | ICD-10-CM

## 2024-03-27 DIAGNOSIS — L299 Pruritus, unspecified: Secondary | ICD-10-CM

## 2024-03-27 DIAGNOSIS — F5104 Psychophysiologic insomnia: Secondary | ICD-10-CM

## 2024-03-27 MED ORDER — BACLOFEN 10 MG PO TABS: 10.0000 mg | ORAL_TABLET | Freq: Three times a day (TID) | ORAL | 5 refills | Status: DC | PRN

## 2024-03-27 MED ORDER — SUVOREXANT 15 MG PO TABS: 15.0000 mg | ORAL_TABLET | Freq: Every day | ORAL | 2 refills | Status: DC

## 2024-03-27 MED ORDER — HYDROXYZINE PAMOATE 25 MG PO CAPS: 25.0000 mg | ORAL_CAPSULE | Freq: Three times a day (TID) | ORAL | 0 refills | Status: DC | PRN

## 2024-03-27 NOTE — Assessment & Plan Note (Signed)
 Compensated. Continue bisoprolol  5 mg daily, sacubitril-valsartan 49-51 mg bid, and PRN furosemide  20 mg.

## 2024-03-27 NOTE — Progress Notes (Signed)
 [facility] [address] [place] [address] Dept: [phone] Dept Fax: [phone]  Chronic Care Office Visit  Subjective:    Patient ID: Troy [name], male    DOB: [birth year], 48 y.o..   MRN: [id number]  Chief Complaint  Patient presents with   Hypertension    F/u meds.  ? Increase Belsomra .   History of Present Illness:  Patient is in today for reassessment of chronic medical issues.  Mr. Minter was last seen by me ~ 1 year ago. In the last year, he had a powerlifting injury that blew out both of his knees. He apparently had an ACL sprain of both knees with some meniscal injury. He underwent knee arthroscopy for both knees (09/16/2023, 12/23/2023). He has been engaged with physical therapy and is approaching a time where he can go back to training.  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 He is currently managed on bisoprolol  5 mg daily, sacubitril-valsartan 49-51 mg bid, and PRN furosemide  20 mg. He denies any current significant edema and is not having dyspnea or orthopnea.   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 He is following with hematology.  Mr. Slagel has a long-standing history of insomnia. We had some difficulty managing this and had tried multiple other medications, including hydroxyzine , trazodone  300 mg, melatonin, GABA, tryptophan, zaleplon  (Sonata ) and suvorexant  (Belsomra ). Currently he is using suvorexant  (Belsomra ) 10 mg nightly. He notes  even with this, he has difficulty getting to sleep. He wonders about a step up in the dose.  Mr. Suarez complains of diffuse pruritus. He notes that diphenhydramine  does not always control this.  Past Medical History: Patient Active Problem List   Diagnosis Date Noted   Rupture of anterior cruciate ligament of both knees 08/18/2023    Scrotal abscess 08/06/2023   Polycythemia, secondary 04/22/2023   Moderate persistent asthma with exacerbation 03/29/2023   SVT (supraventricular tachycardia) (HCC) 12/30/2022   Aortic atherosclerosis (HCC) 12/30/2022   HFrEF (heart failure with reduced ejection fraction) (HCC) 12/30/2022   Blood pressure alteration 12/30/2022   Erythrocytosis 11/13/2022   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   Lower urinary tract symptoms (LUTS) 06/02/2022   Rotator cuff tear, right 03/03/2022   History of squamous cell carcinoma in situ (SCCIS) of skin 02/22/2022   Osteoarthritis of acromioclavicular joint 10/15/2021   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   History of splenectomy 02/02/2021   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   Erectile dysfunction 02/02/2021   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   Pulmonary fibrosis (HCC) 02/02/2021   Peripheral neuropathy 02/02/2021   History of hepatitis C 02/02/2021   Back pain 02/02/2021   Hyperlipidemia 02/02/2021   Moderate persistent asthma, uncomplicated 08/13/2020   History of Hodgkin's lymphoma 08/13/2020   Chronic rhinitis 08/13/2020   Not currently working due to disabled status 08/13/2020   Pain in joint of left knee 07/25/2020   History of thyroid  cancer 06/09/2020   Arthralgia of left elbow 05/01/2020   Autonomic dysfunction 04/23/2020   Iron deficiency anemia due to chronic blood loss 01/18/2020   Microalbuminuria 01/18/2020   Chronic insomnia 01/17/2020   Large liver 01/17/2020   Prediabetes 01/11/2019   Daytime somnolence 07/22/2017   History of basal cell carcinoma (BCC) 11/30/2016   GERD (gastroesophageal reflux disease) 08/25/2016   Essential hypertension 08/25/2016  Substance induced mood disorder (HCC)    History of stroke    Past Surgical History:  Procedure Laterality Date   COLONOSCOPY  07/04/2019   done for hematochezia and FH col ca-mother: Mod int hem w/inflamm.  No adenomatous polyps.    INCISIONAL HERNIA REPAIR  06/2019   RUQ (prev lap append site)   KNEE ARTHROSCOPY WITH MENISCAL REPAIR Right 12/23/2023   Procedure: KNEE ARTHROSCOPY WITH POSSIBLE LATERAL  MENISCAL REPAIR;  Surgeon: Janeth Medicus, MD;  Location: [facility];  Service: Orthopedics;  Laterality: Right;   LAPAROSCOPIC APPENDECTOMY N/A 03/25/2017   Procedure: APPENDECTOMY LAPAROSCOPIC; EXTENSIVE LYSIS OF ADHESIONS FROM PRIOR RADIATION AND SURGERY;  Surgeon: Jacolyn Matar, MD;  Location: [facility];  Service: General;  Laterality: N/A;   MENISCUS REPAIR Left 09/16/2023   Procedure: REPAIR OF LATERAL MENISCUS;  Surgeon: Janeth Medicus, MD;  Location: [facility];  Service: Orthopedics;  Laterality: Left;  120 block in preop   PARATHYROIDECTOMY     SPLENECTOMY     TEE WITHOUT CARDIOVERSION N/A 07/18/2015   Procedure: TRANSESOPHAGEAL ECHOCARDIOGRAM (TEE);  Surgeon: Lake Pilgrim, MD;  Location[facility];  Service: Cardiovascular;  Laterality: N/A;   THYROIDECTOMY     Family History  Problem Relation Age of Onset   Breast cancer Mother        dx early 74s   Colon cancer Mother        dx early 11s   Other Mother        NTHL1 homozygous   Sleep apnea Mother    Parkinsonism Father    Multiple myeloma Father        dx 54s   Sleep apnea Father    Breast cancer Maternal Aunt    Stroke Maternal Grandmother    Leukemia Other        dx <20; two paternal first cousins once removed   Outpatient Medications Prior to Visit  Medication Sig Dispense Refill   ammonium lactate (LAC-HYDRIN) 12 % lotion Apply 1 application topically as needed for dry skin.     aspirin  325 MG tablet Take 1 tablet (325 mg total) by mouth daily. 30 tablet 0   Azelastine  HCl 137 MCG/SPRAY SOLN USE 1 SPRAY(S) IN EACH NOSTRIL ONCE DAILY AS DIRECTED 30 mL 5   bisoprolol  (ZEBETA ) 5 MG tablet TAKE 1 TABLET BY MOUTH AT BEDTIME 90 tablet 1   budesonide  (PULMICORT ) 0.5 MG/2ML nebulizer solution Mix with Xopenex  nebulizer  solution every 6 hours as needed. 120 mL 2   clindamycin  (CLEOCIN  T) 1 % lotion Apply topically as needed (rash). 60 mL 6   diphenhydrAMINE  HCl (BENADRYL  PO) Take by mouth.     doxycycline  (VIBRA -TABS) 100 MG tablet Take 1 tablet (100 mg total) by mouth daily. TAKE WITH HEAVY MEAL 30 tablet 4   doxylamine, Sleep, (UNISOM) 25 MG tablet Take 25 mg by mouth at bedtime as needed for sleep.     EPINEPHrine  0.3 mg/0.3 mL IJ SOAJ injection Inject 0.3 mg into the muscle as needed for anaphylaxis. 2 each 2   esomeprazole  (NEXIUM ) 40 MG capsule TAKE 1 CAPSULE BY MOUTH TWICE DAILY BEFORE A MEAL 180 capsule 3   Evolocumab  (REPATHA  SURECLICK) 140 MG/ML SOAJ Inject 140 mg into the skin every 14 (fourteen) days. 6 mL 3   Fluticasone -Umeclidin-Vilant (TRELEGY ELLIPTA ) 200-62.5-25 MCG/ACT AEPB INHALE 1 PUFF INTO LUNGS ONCE DAILY 60 each 5   furosemide  (LASIX ) 20 MG tablet Take 1 tablet (20 mg total) by mouth  3 (three) times a week. 36 tablet 0   gabapentin  (NEURONTIN ) 300 MG capsule TAKE 2 CAPSULES BY MOUTH IN THE MORNING AND 2 IN THE AFTERNOON AND 4 AT BEDTIME 240 capsule 0   hydrocortisone  (ANUSOL -HC) 25 MG suppository Place 1 suppository (25 mg total) rectally 2 (two) times daily. 12 suppository 0   levalbuterol  (XOPENEX  HFA) 45 MCG/ACT inhaler INHALE 2 PUFFS BY MOUTH EVERY 4 HOURS AS NEEDED FOR WHEEZING 30 g 0   levalbuterol  (XOPENEX ) 1.25 MG/3ML nebulizer solution Take 1.25 mg by nebulization every 4 (four) hours as needed for wheezing. 120 mL 1   levothyroxine  (SYNTHROID ) 300 MCG tablet Take 300 mcg by mouth daily before breakfast.     LUER LOCK SAFETY SYRINGES 22G X 1-1/2 3 ML MISC USE FOR SELF ADMINISTER OF TESTOSTERONE  2CC ONCE EVERY 2 WEEKS     Melatonin 10 MG TABS Take by mouth at bedtime.     Multiple Vitamin (MULTI-VITAMIN) tablet Take 1 tablet by mouth daily.     ondansetron  (ZOFRAN ) 4 MG tablet Take 1 tablet (4 mg total) by mouth every 8 (eight) hours as needed for vomiting or nausea. 20 tablet 0    risperiDONE  (RISPERDAL ) 1 MG tablet TAKE 1 TABLET BY MOUTH AT BEDTIME 30 tablet 1   rosuvastatin  (CRESTOR ) 40 MG tablet Take 1 tablet by mouth once daily 90 tablet 3   sacubitril-valsartan (ENTRESTO ) 49-51 MG Take 1 tablet by mouth 2 (two) times daily. 180 tablet 3   tamsulosin  (FLOMAX ) 0.4 MG CAPS capsule Take 1 capsule by mouth once daily 90 capsule 3   testosterone  cypionate (DEPOTESTOSTERONE CYPIONATE) 200 MG/ML injection Inject 0.6 mLs (120 mg total) into the muscle once a week. 10 mL 0   traZODone  (DESYREL ) 150 MG tablet TAKE 2 TABLETS BY MOUTH AT BEDTIME 180 tablet 3   triamcinolone  (NASACORT ALLERGY 24HR) 55 MCG/ACT AERO nasal inhaler Place 2 sprays into the nose daily.     baclofen  (LIORESAL ) 10 MG tablet Take 1 tablet by mouth three times daily as needed 30 tablet 5   Suvorexant  (BELSOMRA ) 10 MG TABS Take 1 tablet (10 mg total) by mouth at bedtime. 30 tablet 0   No facility-administered medications prior to visit.   Allergies  Allergen Reactions   Ciprofloxacin Other (See Comments)    Spontaneous rupture of tendon   Harden Leyden Officinalis] Anaphylaxis and Hives   Fluocinolone Other (See Comments)    spontaneous rupture of tendons per pt dr.   Alita Apt  Nausea And Vomiting    abscess  Patient refuses to give further information.   Prednisone  Rash   Objective:   Today's Vitals   03/27/24 1516  BP: 130/76  Pulse: 96  Temp: 97.9 F (36.6 C)  TempSrc: Temporal  SpO2: 96%  Weight: 217 lb 12.8 oz (98.8 kg)  Height: 6' (1.829 m)   Body mass index is 29.54 kg/m.   General: Well developed, well nourished. No acute distress. CV: RRR without a II/VI holosystolic murmur. Pulses 2+ bilaterally. Extremities: No edema noted. Skin: Diaphoretic. No rashes on back. Psych: Alert and oriented. Normal mood and affect.  Health Maintenance Due  Topic Date Due   Pneumococcal Vaccine 34-71 Years old (3 of 3 - PPSV23, PCV20 or PCV21) 09/01/2020   Medicare Annual Wellness  (AWV)  05/22/2021   Colonoscopy  Never done     Assessment & Plan:   Problem List Items Addressed This Visit       Cardiovascular and Mediastinum   Essential hypertension  Blood pressure is in good control. Continue bisoprolol  5 mg daily and sacubitril-valsartan 49-51 mg bid.      HFrEF (heart failure with reduced ejection fraction) (HCC)   Compensated. Continue bisoprolol  5 mg daily, sacubitril-valsartan 49-51 mg bid, and PRN furosemide  20 mg.        Other   Back pain   I will renew his baclofen  for management of back discomfort.      Relevant Medications   baclofen  (LIORESAL ) 10 MG tablet   Chronic insomnia   I will try stepping his suvorexant  dose up to 15 mg nightly.      Relevant Medications   Suvorexant  15 MG TABS   Other Visit Diagnoses       Pruritus    -  Primary   Relevant Medications   hydrOXYzine  (VISTARIL ) 25 MG capsule       Return in about 6 months (around 09/26/2024) for Reassessment.   Graig Lawyer, MD

## 2024-03-27 NOTE — Assessment & Plan Note (Signed)
 Blood pressure is in good control. Continue bisoprolol  5 mg daily and sacubitril-valsartan 49-51 mg bid.

## 2024-03-27 NOTE — Assessment & Plan Note (Signed)
 I will renew his baclofen  for management of back discomfort.

## 2024-03-27 NOTE — Assessment & Plan Note (Signed)
 I will try stepping his suvorexant  dose up to 15 mg nightly.

## 2024-03-29 DIAGNOSIS — M25561 Pain in right knee: Secondary | ICD-10-CM | POA: Diagnosis not present

## 2024-04-02 DIAGNOSIS — M25561 Pain in right knee: Secondary | ICD-10-CM | POA: Diagnosis not present

## 2024-04-03 DIAGNOSIS — G4733 Obstructive sleep apnea (adult) (pediatric): Secondary | ICD-10-CM | POA: Diagnosis not present

## 2024-04-04 ENCOUNTER — Telehealth: Payer: Self-pay | Admitting: Neurology

## 2024-04-04 DIAGNOSIS — M25561 Pain in right knee: Secondary | ICD-10-CM | POA: Diagnosis not present

## 2024-04-04 NOTE — Telephone Encounter (Signed)
 Pt called in regard to request earlier appt . Due to  [facility] requesting  for Pt to have face to face visit with MD. Pt has appt schedule but pt would like to be seen sooner

## 2024-04-04 NOTE — Telephone Encounter (Signed)
 Spoke to patient made sooner appointment 04/2024 Pt thanked me for calling

## 2024-04-05 ENCOUNTER — Other Ambulatory Visit: Payer: Self-pay | Admitting: Internal Medicine

## 2024-04-05 DIAGNOSIS — I2510 Atherosclerotic heart disease of native coronary artery without angina pectoris: Secondary | ICD-10-CM

## 2024-04-05 DIAGNOSIS — E785 Hyperlipidemia, unspecified: Secondary | ICD-10-CM

## 2024-04-05 DIAGNOSIS — I700 Atherosclerosis of aorta: Secondary | ICD-10-CM

## 2024-04-06 ENCOUNTER — Telehealth (INDEPENDENT_AMBULATORY_CARE_PROVIDER_SITE_OTHER): Admitting: Adult Health

## 2024-04-06 ENCOUNTER — Encounter: Payer: Self-pay | Admitting: Adult Health

## 2024-04-06 DIAGNOSIS — F602 Antisocial personality disorder: Secondary | ICD-10-CM

## 2024-04-06 DIAGNOSIS — F3181 Bipolar II disorder: Secondary | ICD-10-CM | POA: Diagnosis not present

## 2024-04-06 DIAGNOSIS — F603 Borderline personality disorder: Secondary | ICD-10-CM

## 2024-04-06 DIAGNOSIS — F6081 Narcissistic personality disorder: Secondary | ICD-10-CM

## 2024-04-06 DIAGNOSIS — F4310 Post-traumatic stress disorder, unspecified: Secondary | ICD-10-CM | POA: Diagnosis not present

## 2024-04-06 MED ORDER — RISPERIDONE 1 MG PO TABS: 1.0000 mg | ORAL_TABLET | Freq: Every day | ORAL | 1 refills | Status: DC

## 2024-04-06 NOTE — Progress Notes (Signed)
 Troy [name] [id number] 1976-05-27 48 y.o.  Virtual Visit via Video Note  I connected with pt @ on 04/06/24 at 11:30 AM EDT by a video enabled telemedicine application and verified that I am speaking with the correct person using two identifiers.   I discussed the limitations of evaluation and management by telemedicine and the availability of in person appointments. The patient expressed understanding and agreed to proceed.  I discussed the assessment and treatment plan with the patient. The patient was provided an opportunity to ask questions and all were answered. The patient agreed with the plan and demonstrated an understanding of the instructions.   The patient was advised to call back or seek an in-person evaluation if the symptoms worsen or if the condition fails to improve as anticipated.  I provided 25 minutes of non-face-to-face time during this encounter.  The patient was located at home.  The provider was located at [facility].   Troy LOISE Sayers, NP   Subjective:   Patient ID:  Troy [name] is a 48 y.o. (DOB [birth year]) male.  Chief Complaint: No chief complaint on file.   HPI Troy [name] presents for follow-up of BPD, BPD2, PTSD, antisocial personality disorder, narcisscitic PD.  Describes mood today as ok. Pleasant. Denies tearfulness. Mood symptoms - denies depression - there are times where I don't feel like my life is where I wanted it to be at. Reports stable interest and motivation. Reports deceased anxiety - at times - it's a baseline state. Reports decreased irritability - the medicine has helped. Denies recent panic attack - February. Reports some over and under thinking. Denies worry and rumination. Reports obsessions - I want to do things the exact same way - eating the same foods every day. Reports decreased mood swings. Stating I have seen some improvement in my mood - not as wild or rapid as they were. Reports taking current  medications as prescribed. Energy levels improved. Active, has a regular exercise routine - power lifting. Enjoys some usual interests and activities. Single. Lives alone in an apartment. Parents local. Has 2 sibling - estranged. Spending time with family. Appetite adequate. Weight stable - maintaining. Reports difficulties getting to and staying asleep. Averages 4 to 8 hours. Not using CPAP machine consistently. Reports focus and concentration difficulties. Completing tasks. Managing aspects of household. Reports he is permanently and totally disabled - strokes. Denies SI.  Denies HI.  Denies AH or VH. Denies self harm. Reports substance use smoking THC, kratom and mushrooms. 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 Reports discontinuing alcohol.  Previous medication trials:  Trazadone, Gabapentin , Depakote, Wellbutrin , Zoloft , Sonata , Seroquel    Review of Systems:  Review of Systems  Musculoskeletal:  Negative for gait problem.  Neurological:  Negative for tremors.  Psychiatric/Behavioral:         Please refer to HPI    Medications: I have reviewed the patient's current medications.  Current Outpatient Medications  Medication Sig Dispense Refill   ammonium lactate (LAC-HYDRIN) 12 % lotion Apply 1 application topically as needed for dry skin.     aspirin  325 MG tablet Take 1 tablet (325 mg total) by mouth daily. 30 tablet 0   Azelastine  HCl 137 MCG/SPRAY SOLN USE 1 SPRAY(S) IN EACH NOSTRIL ONCE DAILY AS DIRECTED 30 mL 5   baclofen  (LIORESAL ) 10 MG tablet Take 1 tablet (10 mg total) by mouth 3 (three) times daily as needed. 30 tablet 5   bisoprolol  (ZEBETA ) 5 MG tablet  TAKE 1 TABLET BY MOUTH AT BEDTIME 90 tablet 1   budesonide  (PULMICORT ) 0.5 MG/2ML nebulizer solution Mix with Xopenex  nebulizer solution every 6 hours as needed. 120 mL 2   clindamycin  (CLEOCIN  T) 1 % lotion Apply topically as needed (rash). 60 mL 6   doxycycline  (VIBRA -TABS) 100 MG tablet Take 1 tablet (100  mg total) by mouth daily. TAKE WITH HEAVY MEAL 30 tablet 4   EPINEPHrine  0.3 mg/0.3 mL IJ SOAJ injection Inject 0.3 mg into the muscle as needed for anaphylaxis. 2 each 2   esomeprazole  (NEXIUM ) 40 MG capsule TAKE 1 CAPSULE BY MOUTH TWICE DAILY BEFORE A MEAL 180 capsule 3   Fluticasone -Umeclidin-Vilant (TRELEGY ELLIPTA ) 200-62.5-25 MCG/ACT AEPB INHALE 1 PUFF INTO LUNGS ONCE DAILY 60 each 5   furosemide  (LASIX ) 20 MG tablet Take 1 tablet (20 mg total) by mouth 3 (three) times a week. 36 tablet 0   gabapentin  (NEURONTIN ) 300 MG capsule TAKE 2 CAPSULES BY MOUTH IN THE MORNING AND 2 IN THE AFTERNOON AND 4 AT BEDTIME 240 capsule 0   hydrocortisone  (ANUSOL -HC) 25 MG suppository Place 1 suppository (25 mg total) rectally 2 (two) times daily. 12 suppository 0   hydrOXYzine  (VISTARIL ) 25 MG capsule Take 1 capsule (25 mg total) by mouth every 8 (eight) hours as needed. 30 capsule 0   levalbuterol  (XOPENEX  HFA) 45 MCG/ACT inhaler INHALE 2 PUFFS BY MOUTH EVERY 4 HOURS AS NEEDED FOR WHEEZING 30 g 0   levalbuterol  (XOPENEX ) 1.25 MG/3ML nebulizer solution Take 1.25 mg by nebulization every 4 (four) hours as needed for wheezing. 120 mL 1   levothyroxine  (SYNTHROID ) 300 MCG tablet Take 300 mcg by mouth daily before breakfast.     LUER LOCK SAFETY SYRINGES 22G X 1-1/2 3 ML MISC USE FOR SELF ADMINISTER OF TESTOSTERONE  2CC ONCE EVERY 2 WEEKS     Melatonin 10 MG TABS Take by mouth at bedtime.     Multiple Vitamin (MULTI-VITAMIN) tablet Take 1 tablet by mouth daily.     ondansetron  (ZOFRAN ) 4 MG tablet Take 1 tablet (4 mg total) by mouth every 8 (eight) hours as needed for vomiting or nausea. 20 tablet 0   REPATHA  SURECLICK 140 MG/ML SOAJ INJECT 140 MG  SUBCUTANEOUSLY ONCE EVERY 14 DAYS 6 mL 0   risperiDONE  (RISPERDAL ) 1 MG tablet TAKE 1 TABLET BY MOUTH AT BEDTIME 30 tablet 1   rosuvastatin  (CRESTOR ) 40 MG tablet Take 1 tablet by mouth once daily 90 tablet 3   sacubitril-valsartan (ENTRESTO ) 49-51 MG Take 1 tablet by  mouth 2 (two) times daily. 180 tablet 3   Suvorexant  15 MG TABS Take 1 tablet (15 mg total) by mouth at bedtime. 30 tablet 2   tamsulosin  (FLOMAX ) 0.4 MG CAPS capsule Take 1 capsule by mouth once daily 90 capsule 3   testosterone  cypionate (DEPOTESTOSTERONE CYPIONATE) 200 MG/ML injection Inject 0.6 mLs (120 mg total) into the muscle once a week. 10 mL 0   traZODone  (DESYREL ) 150 MG tablet TAKE 2 TABLETS BY MOUTH AT BEDTIME 180 tablet 3   triamcinolone  (NASACORT ALLERGY 24HR) 55 MCG/ACT AERO nasal inhaler Place 2 sprays into the nose daily.     No current facility-administered medications for this visit.    Medication Side Effects: None  Allergies:  Allergies  Allergen Reactions   Ciprofloxacin Other (See Comments)    Spontaneous rupture of tendon   Emelia Sprawls Officinalis] Anaphylaxis and Hives   Fluocinolone Other (See Comments)    spontaneous rupture of tendons per pt dr.  Dapagliflozin  Nausea And Vomiting    abscess  Patient refuses to give further information.   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   CHF (congestive heart failure) (HCC)    Chronic fatigue    Depression    Drug abuse (HCC)    Ectopic atrial rhythm    Family history of adverse reaction to anesthesia    Dad  has problem with lots of medications   Family history of breast cancer 05/19/2021   Family history of colon cancer in mother    GERD (gastroesophageal reflux disease)    +Barretts   Hematochezia    hemorrhoids   Hepatic steatosis    +hepatomegaly   History of CVA (cerebrovascular accident)    radiation-induced carotid damage/changes   History of hepatitis C    +treated   History of hiatal hernia    History of Hodgkin's lymphoma    age 39->mantle RT + splenectomy   History of splenectomy    Hodgkin's  lymphoma (HCC)    Hyperlipidemia    Hypertension    Insomnia    Male hypogonadism    Moderate persistent asthma    Obesity, Class I, BMI 30-34.9    Papillary thyroid  carcinoma (HCC) 2010   total thyroidectomy + neck dissection   Pneumonia    Postsurgical hypothyroidism    Prediabetes    Pulmonary fibrosis (HCC)    ?rad induced?   Sleep apnea    mild  cpap   Stroke [facility])    x2  2016  october no defecits memory affected   Thyroid  ca [facility])     Family History  Problem Relation Age of Onset   Breast cancer Mother        dx early 76s   Colon cancer Mother        dx early 35s   Other Mother        NTHL1 homozygous   Sleep apnea Mother    Parkinsonism Father    Multiple myeloma Father        dx 32s   Sleep apnea Father    Breast cancer Maternal Aunt    Stroke Maternal Grandmother    Leukemia Other        dx <20; two paternal first cousins once removed    Social History   Socioeconomic History   Marital status: Single    Spouse name: Not on file   Number of children: 0   Years of education: Not on file   Highest education level: Not on file  Occupational History   Not on file  Tobacco Use   Smoking status: Former    Current packs/day: 0.00    Types: Cigarettes    Quit date: 2013    Years since quitting: 12.4   Smokeless tobacco: Never  Vaping Use   Vaping status: Some Days  Substance and Sexual Activity   Alcohol use: Not Currently    Comment: occasionally   Drug use: Yes    Types: Marijuana    Comment: pt states I'm uncomfortable answering this question   Sexual activity: Yes  Other Topics Concern   Not on file  Social History Narrative   Are you right handed or left handed? Right   Are  you currently employed ? No   What is your current occupation? None   Do you live at home alone? yes   Who lives with you? no   What type of home do you live in: 1 story or 2 story? Third floor apratment      10 years I had a heroin addiction, and sometimes I need  more medicines         Social Drivers of Corporate investment banker Strain: Not on file  Food Insecurity: Low Risk  (10/24/2023)   Received from [facility]   Hunger Vital Sign    Within the past 12 months, you worried that your food would run out before you got money to buy more: Never true    Within the past 12 months, the food you bought just didn't last and you didn't have money to get more. : Never true  Transportation Needs: No Transportation Needs (10/24/2023)   Received from [facility]    In the past 12 months, has lack of reliable transportation kept you from medical appointments, meetings, work or from getting things needed for daily living? : No  Physical Activity: Not on file  Stress: Not on file  Social Connections: Not on file  Intimate Partner Violence: Not on file    Past Medical History, Surgical history, Social history, and Family history were reviewed and updated as appropriate.   Please see review of systems for further details on the patient's review from today.   Objective:   Physical Exam:  There were no vitals taken for this visit.  Physical Exam Constitutional:      General: He is not in acute distress.  Musculoskeletal:        General: No deformity.   Neurological:     Mental Status: He is alert and oriented to person, place, and time.     Coordination: Coordination normal.   Psychiatric:        Attention and Perception: Attention and perception normal. He does not perceive auditory or visual hallucinations.        Mood and Affect: Mood normal. Mood is not anxious or depressed. Affect is not labile, blunt, angry or inappropriate.        Speech: Speech normal.        Behavior: Behavior normal.        Thought Content: Thought content normal. Thought content is not paranoid or delusional. Thought content does not include homicidal or suicidal ideation. Thought content does not include homicidal or suicidal plan.         Cognition and Memory: Cognition and memory normal.        Judgment: Judgment normal.     Comments: Insight intact     Lab Review:     Component Value Date/Time   NA 136 12/14/2023 1503   NA 147 (H) 09/12/2023 1026   NA 134 (L) 12/10/2013 2132   K 4.7 12/14/2023 1503   K 4.5 12/10/2013 2132   CL 104 12/14/2023 1503   CL 104 12/10/2013 2132   CO2 22 12/14/2023 1503   CO2 26 12/10/2013 2132   GLUCOSE 92 12/14/2023 1503   GLUCOSE 86 12/10/2013 2132   BUN 16 12/14/2023 1503   BUN 19 09/12/2023 1026   BUN 15 12/10/2013 2132   CREATININE 0.92 12/14/2023 1503   CREATININE 1.14 12/10/2013 2132   CALCIUM  8.3 (L) 12/14/2023 1503   CALCIUM  8.2 (L) 12/10/2013 2132   PROT 7.0 09/12/2023  1026   PROT 7.8 12/10/2013 2132   ALBUMIN 4.3 09/12/2023 1026   ALBUMIN 3.5 12/10/2013 2132   AST 67 (H) 09/12/2023 1026   AST 30 12/10/2013 2132   ALT 107 (H) 09/12/2023 1026   ALT 32 12/10/2013 2132   ALKPHOS 169 (H) 09/12/2023 1026   ALKPHOS 47 12/10/2013 2132   BILITOT 0.5 09/12/2023 1026   BILITOT 0.3 12/10/2013 2132   GFRNONAA >60 12/14/2023 1503   GFRNONAA >60 12/10/2013 2132   GFRAA >60 12/19/2018 1152   GFRAA >60 12/10/2013 2132       Component Value Date/Time   WBC 13.2 (H) 02/07/2024 1336   WBC 9.7 12/14/2023 1503   RBC 4.91 02/07/2024 1336   HGB 16.6 02/07/2024 1336   HGB 17.3 09/05/2023 1017   HCT 46.3 02/07/2024 1336   HCT 49.9 09/05/2023 1017   PLT 233 02/07/2024 1336   PLT 227 09/05/2023 1017   MCV 94.3 02/07/2024 1336   MCV 96 09/05/2023 1017   MCV 93 12/10/2013 2132   MCH 33.8 02/07/2024 1336   MCHC 35.9 02/07/2024 1336   RDW 17.2 (H) 02/07/2024 1336   RDW 17.2 (H) 09/05/2023 1017   RDW 16.0 (H) 12/10/2013 2132   LYMPHSABS 1.4 02/07/2024 1336   LYMPHSABS 3.3 (H) 08/12/2020 1436   MONOABS 1.6 (H) 02/07/2024 1336   EOSABS 0.3 02/07/2024 1336   EOSABS 0.5 (H) 08/12/2020 1436   BASOSABS 0.1 02/07/2024 1336   BASOSABS 0.1 08/12/2020 1436    No results found for:  POCLITH, LITHIUM   No results found for: PHENYTOIN, PHENOBARB, VALPROATE, CBMZ   .res Assessment: Plan:    Treatment Plan/Recommendations:   Plan:  PDMP reviewed  Risperdal  1mg  at bedtime  RTC 4/6 weeks  25 minutes spent dedicated to the care of this patient on the date of this encounter to include pre-visit review of records, ordering of medication, post visit documentation, and face-to-face time with the patient discussing BPD, BPD2, PTSD, antisocial personality disorder, narcisscitic PD. Discussed continuing Risperdal  1mg  at bedtime.   Patient advised to contact office with any questions, adverse effects, or acute worsening in signs and symptoms.  There are no diagnoses linked to this encounter.   Please see After Visit Summary for patient specific instructions.  Future Appointments  Date Time Provider Department Center  04/06/2024 11:30 AM Liyat Faulkenberry, Troy Mattocks, NP [facility] None  04/11/2024  1:00 PM Schuyler Damien HERO [id number]  04/25/2024  2:15 PM Buck Saucer, MD [facility] None  05/17/2024 11:00 AM Iva Marty Saltness, MD [facility] None  07/04/2024  1:15 PM Buck Saucer, MD [facility] None  08/08/2024  3:00 PM [facility] LAB [facility] None  08/08/2024  3:45 PM [facility] INFUSION [facility] None  09/26/2024  3:00 PM Thedora Garnette HERO, MD [id number]    No orders of the defined types were placed in this encounter.     -------------------------------

## 2024-04-07 ENCOUNTER — Other Ambulatory Visit: Payer: Self-pay | Admitting: Family Medicine

## 2024-04-07 DIAGNOSIS — G63 Polyneuropathy in diseases classified elsewhere: Secondary | ICD-10-CM

## 2024-04-10 DIAGNOSIS — M25561 Pain in right knee: Secondary | ICD-10-CM | POA: Diagnosis not present

## 2024-04-11 ENCOUNTER — Other Ambulatory Visit: Payer: Self-pay | Admitting: Family Medicine

## 2024-04-11 ENCOUNTER — Ambulatory Visit: Admitting: Licensed Clinical Social Worker

## 2024-04-11 DIAGNOSIS — E785 Hyperlipidemia, unspecified: Secondary | ICD-10-CM

## 2024-04-25 ENCOUNTER — Encounter: Payer: Self-pay | Admitting: Neurology

## 2024-04-25 ENCOUNTER — Ambulatory Visit (INDEPENDENT_AMBULATORY_CARE_PROVIDER_SITE_OTHER): Admitting: Neurology

## 2024-04-25 VITALS — BP 108/63 | HR 90 | Ht 72.0 in | Wt 218.6 lb

## 2024-04-25 DIAGNOSIS — G4733 Obstructive sleep apnea (adult) (pediatric): Secondary | ICD-10-CM | POA: Diagnosis not present

## 2024-04-25 DIAGNOSIS — Z789 Other specified health status: Secondary | ICD-10-CM

## 2024-04-25 NOTE — Progress Notes (Signed)
 Subjective:    Patient ID: Troy [name] is a 48 y.o. male.  HPI    Interim history:    Troy [name] is a 48 year old male with an underlying complex medical history of asthma, allergies, aortic atherosclerosis, hypertension, skin cancer, thyroid  cancer with status post total thyroidectomy and neck dissection, hypothyroidism, hyperlipidemia, Hodgkin's lymphoma with s/p XRT at age [age] yo, hepatitis C, history of CVA, mood disorder and overweight state, who presents for follow-up consultation of his obstructive sleep apnea after interim testing and starting home AutoPap therapy.  The patient is unaccompanied today.  I first met him at the request of his primary care physician on 08/24/2023, at which time he reported snoring and witnessed apneas.  He was advised to proceed with a sleep study.  He had a home sleep test through our office on 09/26/2023 which showed overall mild obstructive sleep apnea with an AHI of 9.6/h, O2 nadir 85% but time below or at 88% saturation of over 20 minutes for the night with mild to moderate snoring detected.  He was advised to proceed with home AutoPap therapy.  His set up date was 10/24/2023.  He has a ResMed air sense 11 AutoSet machine, DME provider is [facility].   Today, 04/25/2024: I reviewed his AutoPap compliance data for the latest 90 days, he has had a decline in compliance.  In the month of March he had better compliance, percent use days greater than 4 hours at 50% of the time, his AHI is at goal of around 1/h, average usage of 4 hours and 25 minutes in the month of March.  He has had difficulty using his CPAP because of interim surgeries which he had in January and March, he is currently also staying with his mom to help her out after her shoulder surgery.  He does note that he feels a little better when he is able to use his CPAP but he is still not fully consistent with it, he is motivated to continue with it.  He still has trouble falling asleep.  He is on  multiple medications, he is followed by psychiatry.  The patient's allergies, current medications, family history, past medical history, past social history, past surgical history and problem list were reviewed and updated as appropriate.   Previously:  08/24/2023: (He) reports snoring and witnessed apneas, per girlfriend.  His Epworth sleepiness score is 0 out of 24, fatigue severity score is 39 out of 63.Troy [name] He saw Dr. Skeet Finn neurology last year for transient neurological symptoms.  He takes several sedating medications, reports chronic difficulty initiating and maintaining sleep.  He takes gabapentin  1800 mg at night and 600 mg in the morning.  He takes trazodone  300 mg at bedtime, doxylamine at night, melatonin at night, Benadryl  about 50 to 75 mg each night.  He drinks caffeine in the form of coffee, 50 to 100 mg daily, preworkout also includes caffeine so altogether up to 300 mg of caffeine per day he estimates.  He lives alone, he has no children, no pets in the house.  He drinks alcohol rarely.  He smokes marijuana daily.  He has a remote history of drug use including heroin, in remission for about 10 years.  He is on disability.  He reports having had 2 strokes in 2016.  He recently injured both knees, he has seen orthopedics.  He may need surgery on both knees.  Both parents have sleep apnea and uses PAP machines.    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   CHF (congestive heart failure) (HCC)    Chronic fatigue    Depression    Drug abuse (HCC)    Ectopic atrial rhythm    Family history of adverse reaction to anesthesia    Dad  has problem with lots of medications   Family history of breast cancer 05/19/2021   Family history of colon cancer in mother    GERD  (gastroesophageal reflux disease)    +Barretts   Hematochezia    hemorrhoids   Hepatic steatosis    +hepatomegaly   History of CVA (cerebrovascular accident)    radiation-induced carotid damage/changes   History of hepatitis C    +treated   History of hiatal hernia    History of Hodgkin's lymphoma    age 59->mantle RT + splenectomy   History of splenectomy    Hodgkin's lymphoma (HCC)    Hyperlipidemia    Hypertension    Insomnia    Male hypogonadism    Moderate persistent asthma    Obesity, Class I, BMI 30-34.9    Papillary thyroid  carcinoma (HCC) 2010   total thyroidectomy + neck dissection   Pneumonia    Postsurgical hypothyroidism    Prediabetes    Pulmonary fibrosis (HCC)    ?rad induced?   Sleep apnea    mild  cpap   Stroke [facility])    x2  2016  october no defecits memory affected   Thyroid  ca [facility])     His Past Surgical History Is Significant For: Past Surgical History:  Procedure Laterality Date   COLONOSCOPY  07/04/2019   done for hematochezia and FH col ca-mother: Mod int hem w/inflamm.  No adenomatous polyps.   INCISIONAL HERNIA REPAIR  06/2019   RUQ (prev lap append site)   KNEE ARTHROSCOPY WITH MENISCAL REPAIR Right 12/23/2023   Procedure: KNEE ARTHROSCOPY WITH POSSIBLE LATERAL  MENISCAL REPAIR;  Surgeon: Sharl Selinda Dover, MD;  Location: [facility];  Service: Orthopedics;  Laterality: Right;   LAPAROSCOPIC APPENDECTOMY N/A 03/25/2017   Procedure: APPENDECTOMY LAPAROSCOPIC; EXTENSIVE LYSIS OF ADHESIONS FROM PRIOR RADIATION AND SURGERY;  Surgeon: Gladis Cough, MD;  Location: [facility];  Service: General;  Laterality: N/A;   MENISCUS REPAIR Left 09/16/2023   Procedure: REPAIR OF LATERAL MENISCUS;  Surgeon: Sharl Selinda Dover, MD;  Location: [facility];  Service: Orthopedics;  Laterality: Left;  120 block in preop   PARATHYROIDECTOMY     SPLENECTOMY     TEE WITHOUT CARDIOVERSION N/A 07/18/2015   Procedure: TRANSESOPHAGEAL ECHOCARDIOGRAM (TEE);   Surgeon: Aleene JINNY Passe, MD;  [facility];  Service: Cardiovascular;  Laterality: N/A;   THYROIDECTOMY      His Family History Is Significant For: Family History  Problem Relation Age of Onset   Breast cancer Mother        dx early 69s   Colon cancer Mother        dx early 67s   Other Mother        NTHL1 homozygous   Sleep apnea Mother    Parkinsonism Father    Multiple myeloma Father        dx 73s   Sleep apnea Father    Breast cancer  Maternal Aunt    Stroke Maternal Grandmother    Leukemia Other        dx <20; two paternal first cousins once removed    His Social History Is Significant For: Social History   Socioeconomic History   Marital status: Single    Spouse name: Not on file   Number of children: 0   Years of education: Not on file   Highest education level: Not on file  Occupational History   Not on file  Tobacco Use   Smoking status: Former    Current packs/day: 0.00    Types: Cigarettes    Quit date: 2013    Years since quitting: 12.5   Smokeless tobacco: Never  Vaping Use   Vaping status: Some Days  Substance and Sexual Activity   Alcohol use: Not Currently    Comment: occasionally   Drug use: Yes    Types: Marijuana    Comment: pt states I'm uncomfortable answering this question   Sexual activity: Yes  Other Topics Concern   Not on file  Social History Narrative   Are you right handed or left handed? Right   Are you currently employed ? No   What is your current occupation? None   Do you live at home alone? yes   Who lives with you? no   What type of home do you live in: 1 story or 2 story? Third floor apratment      10 years I had a heroin addiction, and sometimes I need more medicines         Disablity          Social Drivers of Health   Financial Resource Strain: Not on file  Food Insecurity: Low Risk  (10/24/2023)   Received from [facility]   Hunger Vital Sign    Within the past 12 months, you worried that  your food would run out before you got money to buy more: Never true    Within the past 12 months, the food you bought just didn't last and you didn't have money to get more. : Never true  Transportation Needs: No Transportation Needs (10/24/2023)   Received from [facility]    In the past 12 months, has lack of reliable transportation kept you from medical appointments, meetings, work or from getting things needed for daily living? : No  Physical Activity: Not on file  Stress: Not on file  Social Connections: Not on file    His Allergies Are:  Allergies  Allergen Reactions   Ciprofloxacin Other (See Comments)    Spontaneous rupture of tendon   Emelia Sprawls Officinalis] Anaphylaxis and Hives   Fluocinolone Other (See Comments)    spontaneous rupture of tendons per pt dr.   Dapagliflozin  Nausea And Vomiting    abscess  Patient refuses to give further information.   Prednisone  Rash  :   His Current Medications Are:  Outpatient Encounter Medications as of 04/25/2024  Medication Sig   ammonium lactate (LAC-HYDRIN) 12 % lotion Apply 1 application topically as needed for dry skin.   aspirin  325 MG tablet Take 1 tablet (325 mg total) by mouth daily.   Azelastine  HCl 137 MCG/SPRAY SOLN USE 1 SPRAY(S) IN EACH NOSTRIL ONCE DAILY AS DIRECTED   baclofen  (LIORESAL ) 10 MG tablet Take 1 tablet (10 mg total) by mouth 3 (three) times daily as needed.   bisoprolol  (ZEBETA ) 5 MG tablet TAKE 1 TABLET BY MOUTH AT BEDTIME  budesonide  (PULMICORT ) 0.5 MG/2ML nebulizer solution Mix with Xopenex  nebulizer solution every 6 hours as needed.   clindamycin  (CLEOCIN  T) 1 % lotion Apply topically as needed (rash).   doxycycline  (VIBRA -TABS) 100 MG tablet Take 1 tablet (100 mg total) by mouth daily. TAKE WITH HEAVY MEAL   EPINEPHrine  0.3 mg/0.3 mL IJ SOAJ injection Inject 0.3 mg into the muscle as needed for anaphylaxis.   esomeprazole  (NEXIUM ) 40 MG capsule TAKE 1 CAPSULE BY MOUTH TWICE  DAILY BEFORE A MEAL   Fluticasone -Umeclidin-Vilant (TRELEGY ELLIPTA ) 200-62.5-25 MCG/ACT AEPB INHALE 1 PUFF INTO LUNGS ONCE DAILY   furosemide  (LASIX ) 20 MG tablet Take 1 tablet (20 mg total) by mouth 3 (three) times a week.   gabapentin  (NEURONTIN ) 300 MG capsule TAKE 2 CAPSULES BY MOUTH IN THE MORNING AND 2 IN THE AFTERNOON AND 4 CAPSULES AT BEDTIME   hydrocortisone  (ANUSOL -HC) 25 MG suppository Place 1 suppository (25 mg total) rectally 2 (two) times daily.   hydrOXYzine  (VISTARIL ) 25 MG capsule Take 1 capsule (25 mg total) by mouth every 8 (eight) hours as needed.   levalbuterol  (XOPENEX  HFA) 45 MCG/ACT inhaler INHALE 2 PUFFS BY MOUTH EVERY 4 HOURS AS NEEDED FOR WHEEZING   levalbuterol  (XOPENEX ) 1.25 MG/3ML nebulizer solution Take 1.25 mg by nebulization every 4 (four) hours as needed for wheezing.   levothyroxine  (SYNTHROID ) 300 MCG tablet Take 300 mcg by mouth daily before breakfast.   LUER LOCK SAFETY SYRINGES 22G X 1-1/2 3 ML MISC USE FOR SELF ADMINISTER OF TESTOSTERONE  2CC ONCE EVERY 2 WEEKS   Melatonin 10 MG TABS Take by mouth at bedtime.   Multiple Vitamin (MULTI-VITAMIN) tablet Take 1 tablet by mouth daily.   ondansetron  (ZOFRAN ) 4 MG tablet Take 1 tablet (4 mg total) by mouth every 8 (eight) hours as needed for vomiting or nausea.   REPATHA  SURECLICK 140 MG/ML SOAJ INJECT 140 MG  SUBCUTANEOUSLY ONCE EVERY 14 DAYS   risperiDONE  (RISPERDAL ) 1 MG tablet Take 1 tablet (1 mg total) by mouth at bedtime.   rosuvastatin  (CRESTOR ) 40 MG tablet Take 1 tablet by mouth once daily   sacubitril-valsartan (ENTRESTO ) 49-51 MG Take 1 tablet by mouth 2 (two) times daily.   Suvorexant  15 MG TABS Take 1 tablet (15 mg total) by mouth at bedtime.   tamsulosin  (FLOMAX ) 0.4 MG CAPS capsule Take 1 capsule by mouth once daily   testosterone  cypionate (DEPOTESTOSTERONE CYPIONATE) 200 MG/ML injection Inject 0.6 mLs (120 mg total) into the muscle once a week.   traZODone  (DESYREL ) 150 MG tablet TAKE 2 TABLETS  BY MOUTH AT BEDTIME   triamcinolone  (NASACORT ALLERGY 24HR) 55 MCG/ACT AERO nasal inhaler Place 2 sprays into the nose daily.   No facility-administered encounter medications on file as of 04/25/2024.  :  Review of Systems:  Out of a complete 14 point review of systems, all are reviewed and negative with the exception of these symptoms as listed below:  Review of Systems  Neurological:        Pt here for cpap f/u Pt states still not feeling well Pt states still snores,not sleeping during  the night ,fatigue . Pt states some headaches     ESS:7 FSS:40     Objective:  Neurological Exam  Physical Exam Physical Examination:   Vitals:   04/25/24 1420  BP: 108/63  Pulse: 90    General Examination: The patient is a very pleasant 48 y.o. male in no acute distress. He appears well-developed and well-nourished and well groomed.   HEENT:  Normocephalic, atraumatic, pupils are equal, round and reactive to light, extraocular tracking is good without limitation to gaze excursion or nystagmus noted. Hearing is grossly intact. Face is symmetric with normal facial animation. Speech is clear with no dysarthria noted. There is no hypophonia. There is no lip, neck/head, jaw or voice tremor. Neck is supple with full range of passive and active motion. There are no carotid bruits on auscultation. Oropharynx exam reveals: severe mouth dryness, adequate dental hygiene, moderate airway crowding secondary to small airway entry, Mallampati class III, larger uvula noted.  Tongue protrudes centrally and palate elevates symmetrically, tonsillar size of about 1+ bilaterally.  Neck circumference 15 inches.  Minimal overbite noted.    Chest: Clear to auscultation without wheezing, rhonchi or crackles noted.   Heart: S1+S2+0, regular and normal without murmurs, rubs or gallops noted.    Abdomen: Soft, non-tender and non-distended.   Extremities: There is trace pitting edema in the distal lower extremities  bilaterally.    Skin: Warm and dry without trophic changes noted.    Musculoskeletal: exam reveals limited range of motion both knees, soft knee braces in place bilaterally.     Neurologically:  Mental status: The patient is awake, alert and oriented in all 4 spheres. His immediate and remote memory, attention, language skills and fund of knowledge are appropriate. There is no evidence of aphasia, agnosia, apraxia or anomia. Speech is clear with normal prosody and enunciation. Thought process is linear. Mood is normal and affect is normal.  Cranial nerves II - XII are as described above under HEENT exam.  Motor exam: Normal bulk, strength and tone is noted. There is no obvious action or resting tremor.  Fine motor skills and coordination: grossly intact.  Cerebellar testing: No dysmetria or intention tremor. There is no truncal or gait ataxia.  Sensory exam: intact to light touch in the upper and lower extremities.  Gait, station and balance: Stands without difficulty, he walks with 1 crutch.    Assessment and Plan:  In summary, Troy [name] is a 48 year old male with an underlying complex medical history of asthma, allergies, aortic atherosclerosis, hypertension, skin cancer, thyroid  cancer with status post total thyroidectomy and neck dissection, hypothyroidism, hyperlipidemia, Hodgkin's lymphoma with s/p XRT at age [age] yo, hepatitis C, history of CVA, mood disorder and overweight state, who presents for follow-up consultation of his obstructive sleep apnea after interim testing and starting home AutoPap therapy. He had a home sleep test through our office on 09/26/2023 which showed overall mild obstructive sleep apnea with an AHI of 9.6/h, O2 nadir 85% but time below or at 88% saturation of over 20 minutes for the night with mild to moderate snoring detected.  He was started on home AutoPap therapy on 10/24/2023.  He has a ResMed air sense 11 AutoSet machine, DME provider is [facility].  He  is currently not fully compliant with treatment, has had some interim issues that prevented him from sleeping well, had 2 knee surgeries and is currently also staying with his mom to help take care of her.  He is motivated to continue with treatment.  His apnea is under good control when he is on his AutoPap.  He is advised to continue to be consistent with AutoPap therapy.  He is advised to follow-up routinely to see one of our nurse practitioners in 6 months, sooner if needed.  I answered all his questions today and he was in agreement. I spent 30 minutes in total face-to-face time and  in reviewing records during pre-charting, more than 50% of which was spent in counseling and coordination of care, reviewing test results, reviewing medications and treatment regimen and/or in discussing or reviewing the diagnosis of OSA, the prognosis and treatment options. Pertinent laboratory and imaging test results that were available during this visit with the patient were reviewed by me and considered in my medical decision making (see chart for details).

## 2024-04-30 DIAGNOSIS — E890 Postprocedural hypothyroidism: Secondary | ICD-10-CM | POA: Diagnosis not present

## 2024-04-30 DIAGNOSIS — D751 Secondary polycythemia: Secondary | ICD-10-CM | POA: Diagnosis not present

## 2024-04-30 DIAGNOSIS — C73 Malignant neoplasm of thyroid gland: Secondary | ICD-10-CM | POA: Diagnosis not present

## 2024-05-02 ENCOUNTER — Ambulatory Visit: Admitting: Licensed Clinical Social Worker

## 2024-05-02 DIAGNOSIS — F4310 Post-traumatic stress disorder, unspecified: Secondary | ICD-10-CM

## 2024-05-02 DIAGNOSIS — F3181 Bipolar II disorder: Secondary | ICD-10-CM
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
## 2024-05-07 DIAGNOSIS — G4733 Obstructive sleep apnea (adult) (pediatric): Secondary | ICD-10-CM | POA: Diagnosis not present

## 2024-05-16 ENCOUNTER — Ambulatory Visit (INDEPENDENT_AMBULATORY_CARE_PROVIDER_SITE_OTHER): Admitting: Licensed Clinical Social Worker

## 2024-05-16 DIAGNOSIS — F3181 Bipolar II disorder: Secondary | ICD-10-CM | POA: Diagnosis not present

## 2024-05-16 DIAGNOSIS — F603 Borderline personality disorder: Secondary | ICD-10-CM | POA: Diagnosis not present

## 2024-05-16 DIAGNOSIS — F4310 Post-traumatic stress disorder, unspecified: Secondary | ICD-10-CM | POA: Diagnosis not present

## 2024-05-16 DIAGNOSIS — Z4789 Encounter for other orthopedic aftercare: Secondary | ICD-10-CM | POA: Diagnosis not present

## 2024-05-16 NOTE — Progress Notes (Signed)
 [facility] Counselor/Therapist Progress Note  Patient ID: Troy [name], MRN: [id number]    Date: 05/16/24  Time Spent: 0100  pm - 0157 pm : 57 Minutes  Treatment Type: Individual Therapy.  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 Patient reports both parents are ill.   Mental Status Exam: Appearance:  Casual     Behavior: Appropriate  Motor: Normal  Speech/Language:  Clear and Coherent  Affect: Appropriate  Mood: normal  Thought process: normal  Thought content:   WNL  Sensory/Perceptual disturbances:   WNL  Orientation: oriented to person, place, time/date, situation, day of week, month of year, and year  Attention: Good  Concentration: Good  Memory: WNL  Fund of knowledge:  Good  Insight:   Good  Judgment:  Good  Impulse Control: Good   Risk Assessment: Danger to Self:  No Self-injurious Behavior: No Danger to Others: No Duty to Warn:no Physical Aggression / Violence:No  Access to Firearms a concern: No  Gang Involvement:No   Subjective:   Troy [name] participated in person from office, located at [employer]. Troy [name] consented to treatment. Therapist participated from office located at [facility].   Troy [name] presented for his session by handing Clinician his last psych evaluation. Patient  reports that he felt this would help Clinician have a better understanding of his diagnosis and history. Patient and Clinician discussed the details of the evaluation as well as patients history with his siblings and their lack of a relationship. Gaylen was very good at identifying responsibility for certain behaviors including his substance use behaviors.   Clinician actively listened and engaged in discussion with patient. Clinician processed  with patient his feelings surrounding his siblings and how they treat him, and how they refuse to accept that he has changed. Clinician encouraged patient to focus on what he can control and that is him and his response to how they treat him. Clinician encouraged patient to focus on his well being and taking care of himself and his parents. Clinician encouraged patient to utilize coping skills such as grounding and mindfulness to help keep him calm.  Troy [name] was pleasant and cooperative and was fully engaged in discussion. Troy [name] was motivated for treatment and was evidenced by his ability to take responsibility for behaviors and desire to improve relationships. Troy [name] will utilize coping skills to assist him in decreasing symptoms. He will continue to engage in bi weekly therapy sessions. Treatment planning to be reviewed by 05/02/2025.  Interventions: Cognitive Behavioral Therapy, Dialectical Behavioral Therapy, Assertiveness/Communication, Motivational Interviewing, Solution-Oriented/Positive Psychology, and Insight-Oriented  Diagnosis: PTSD, Hx of Bipolar II Disorder, Borderline Personality Disorder    [name] MSW, LCSW/DATE 05/16/2024

## 2024-05-17 ENCOUNTER — Ambulatory Visit (INDEPENDENT_AMBULATORY_CARE_PROVIDER_SITE_OTHER): Payer: 59 | Admitting: Allergy & Immunology

## 2024-05-17 ENCOUNTER — Encounter: Payer: Self-pay | Admitting: Allergy & Immunology

## 2024-05-17 ENCOUNTER — Other Ambulatory Visit: Payer: Self-pay

## 2024-05-17 VITALS — BP 130/78 | HR 92 | Temp 98.3°F | Resp 18 | Ht 69.69 in | Wt 216.5 lb

## 2024-05-17 DIAGNOSIS — T782XXD Anaphylactic shock, unspecified, subsequent encounter: Secondary | ICD-10-CM | POA: Diagnosis not present

## 2024-05-17 DIAGNOSIS — J4550 Severe persistent asthma, uncomplicated: Secondary | ICD-10-CM | POA: Diagnosis not present

## 2024-05-17 DIAGNOSIS — J302 Other seasonal allergic rhinitis: Secondary | ICD-10-CM

## 2024-05-17 DIAGNOSIS — Y9389 Activity, other specified: Secondary | ICD-10-CM

## 2024-05-17 DIAGNOSIS — J3089 Other allergic rhinitis: Secondary | ICD-10-CM | POA: Diagnosis not present

## 2024-05-17 MED ORDER — EPINEPHRINE 0.3 MG/0.3ML IJ SOAJ: 0.3000 mg | INTRAMUSCULAR | 2 refills | Status: AC | PRN

## 2024-05-17 MED ORDER — AZELASTINE HCL 137 MCG/SPRAY NA SOLN: 2.0000 | Freq: Two times a day (BID) | NASAL | 5 refills | Status: AC | PRN

## 2024-05-17 NOTE — Patient Instructions (Addendum)
 1. Moderate persistent asthma, uncomplicated - Lung testing not done today.  - We will do it next time.  - Daily controller medication(s): Trelegy 200/62.5/25 one puff once daily + Pulmicort  (budesonide )/levalbuterol  nebulizer twice daily when needed  - Prior to physical activity: Xopenex  2 puffs 10-15 minutes before physical activity. - Rescue medications: Xopenex  (levalbuterol ) 4 puffs every 4-6 hours as needed  - Asthma control goals:  * Full participation in all desired activities (may need albuterol  before activity) * Albuterol  use two time or less a week on average (not counting use with activity) * Cough interfering with sleep two time or less a month * Oral steroids no more than once a year * No hospitalizations  2. Chronic rhinitis (dust mites, cat, dog, grasses, molds, trees, and ragweed) - Continue with the Claritin (loratadine) 10mg  daily.  - Continue with Allegra (fexofenadine) 10mg  daily.  - Continue with Allercort one spray per nostril daily. - Continue with Astelin  one spray per nostril daily.  3. 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  - We will continue to monitor.    4. Return in about 6 months (around 11/17/2024). You can have the follow up appointment with Dr. Iva or a Nurse Practicioner (our Nurse Practitioners are excellent and always have Physician oversight!).    Please inform us  of any Emergency Department visits, hospitalizations, or changes in symptoms. Call us  before going to the ED for breathing or allergy symptoms since we might be able to fit you in for a sick visit. Feel free to contact us  anytime with any questions, problems, or concerns.  It was a pleasure to see you again today!  Websites that have reliable patient information: 1. American Academy of Asthma, Allergy, and Immunology: www.aaaai.org 2. Food Allergy Research and Education (FARE): foodallergy.org 3. Mothers of Asthmatics:  http://www.asthmacommunitynetwork.org 4. American College of Allergy, Asthma, and Immunology: www.acaai.org      "Like" us  on Facebook and Instagram for our latest updates!      A healthy democracy works best when [employer] participate! Make sure you are registered to vote! If you have moved or changed any of your contact information, you will need to get this updated before voting! Scan the QR codes below to learn more!

## 2024-05-17 NOTE — Progress Notes (Signed)
 FOLLOW UP  Date of Service/Encounter:  05/17/24   Assessment:   Moderate persistent asthma - much better on Trelegy in combination with budeonisde + albuterol  BID added during flares   Heart failure with reduced ejection fraction - on Lasix  and two antihypertensives    Elevated AEC in the past (600 in March 2020)   Perennial and seasonal allergic rhinitis (dust mites, cat, dog, grasses, molds, trees, and ragweed)   Hypercholesterolemia - now on Repatha    Complicated past medical history, including Hodgkin's lymphoma at age 70 and subsequent malignancies since that time  Plan/Recommendations:   1. Moderate persistent asthma, uncomplicated - Lung testing not done today.  - We will do it next time.  - Daily controller medication(s): Trelegy 200/62.5/25 one puff once daily + Pulmicort  (budesonide )/levalbuterol  nebulizer twice daily when needed  - Prior to physical activity: Xopenex  2 puffs 10-15 minutes before physical activity. - Rescue medications: Xopenex  (levalbuterol ) 4 puffs every 4-6 hours as needed  - Asthma control goals:  * Full participation in all desired activities (may need albuterol  before activity) * Albuterol  use two time or less a week on average (not counting use with activity) * Cough interfering with sleep two time or less a month * Oral steroids no more than once a year * No hospitalizations  2. Chronic rhinitis (dust mites, cat, dog, grasses, molds, trees, and ragweed) - Continue with the Claritin (loratadine) 10mg  daily.  - Continue with Allegra (fexofenadine) 10mg  daily.  - Continue with Allercort one spray per nostril daily. - Continue with Astelin  one spray per nostril daily.  3. 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  - We will continue to monitor.    4. Return in about 6 months (around 11/17/2024). You can have the follow up appointment with Dr. Iva or a Nurse Practicioner (our Nurse Practitioners are excellent and  always have Physician oversight!).    Subjective:   Troy [name] is a 48 y.o. male presenting today for follow up of  Chief Complaint  Patient presents with   Follow-up    BENECIO KLUGER has a history of the following: Patient Active Problem List   Diagnosis Date Noted   PTSD (post-traumatic stress disorder) 05/16/2024   Bipolar II disorder (HCC) 05/16/2024   Borderline personality disorder (HCC) 05/16/2024   Rupture of anterior cruciate ligament of both knees 08/18/2023   Scrotal abscess 08/06/2023   Polycythemia, secondary 04/22/2023   Moderate persistent asthma with exacerbation 03/29/2023   SVT (supraventricular tachycardia) (HCC) 12/30/2022   Aortic atherosclerosis (HCC) 12/30/2022   HFrEF (heart failure with reduced ejection fraction) (HCC) 12/30/2022   Blood pressure alteration 12/30/2022   Erythrocytosis 11/13/2022   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   Lower urinary tract symptoms (LUTS) 06/02/2022   Rotator cuff tear, right 03/03/2022   History of squamous cell carcinoma in situ (SCCIS) of skin 02/22/2022   Osteoarthritis of acromioclavicular joint 10/15/2021   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   History of splenectomy 02/02/2021   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   Erectile dysfunction 02/02/2021   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   Pulmonary fibrosis (HCC) 02/02/2021   Peripheral neuropathy 02/02/2021   History of hepatitis C 02/02/2021   Back pain 02/02/2021   Hyperlipidemia 02/02/2021   Moderate persistent asthma, uncomplicated 08/13/2020   History of Hodgkin's lymphoma 08/13/2020   Chronic rhinitis 08/13/2020   Not currently working due to disabled status 08/13/2020   Pain in joint of left knee  07/25/2020   History of thyroid  cancer 06/09/2020   Arthralgia of left elbow 05/01/2020   Autonomic dysfunction 04/23/2020   Iron deficiency anemia due to chronic blood loss 01/18/2020   Microalbuminuria  01/18/2020   Chronic insomnia 01/17/2020   Large liver 01/17/2020   Prediabetes 01/11/2019   Daytime somnolence 07/22/2017   History of basal cell carcinoma (BCC) 11/30/2016   GERD (gastroesophageal reflux disease) 08/25/2016   Essential hypertension 08/25/2016   Substance induced mood disorder (HCC)    History of stroke     History obtained from: chart review and patient.  Discussed the use of AI scribe software for clinical note transcription with the patient and/or guardian, who gave verbal consent to proceed.  Kierre is a 48 y.o. male presenting for a follow up visit.  He was last seen in February 2025.  At that time, we started him on Augmentin  for sinusitis.  His lung testing was not done since he was feeling bad.  We continued him on Trelegy 10 mcg 1 puff once daily as well as Pulmicort  and Xopenex  mixed together twice daily.  We tried to avoid prednisone .  We gave him a sample of AirSupra  to use 2 puffs every 4-6 hours as needed.  For the rhinitis, we continue with Claritin as well as Allegra and his nasal sprays.  Since last visit, he has done fairly well.  Asthma/Respiratory Symptom History: He continues to manage his asthma with Trelegy and uses Pulmicort  (budesonide ) via nebulizer mixed with levobutyril as needed, primarily when ill, though he hasn't required it recently. He uses a levobutyril inhaler, generally no more than once a day, with effectiveness varying depending on his smoking habits. He has a history of long-term albuterol  inhaler use. No recent need for prednisone  or antibiotics, and no recent anaphylactic reactions. He carries an EpiPen  but has never used it.  He has a history of vaping and smoking weed, which he acknowledges affects his respiratory symptoms. He recently resumed vaping after a stressful personal situation involving a former partner.   He has heart failure with reduced ejection fraction and a history of chemotherapy and radiation treatments. He  mentions a previous misunderstanding with a cardiologist who did not inform him of his heart failure diagnosis. He is currently under the care of Dr. Santo at [facility]. Despite his condition, he set three national powerlifting records in March of the previous year. 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 He has since discontinued the medication and is not on an alternative for reversing heart failure effects. He has been on Repatha  without adverse reactions.  Allergic Rhinitis Symptom History: He remains on the Claritin and Allegra as well as his 2 nasal sprays.  He has not needed antibiotics at all since last visit.  He has not had any issues with anaphylaxis at all since last time we saw him.  This was mostly an issue when he was biking.  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  His parents are doing fairly well.  His mother is going to be having a shoulder replacement surgery soon.  His father continues to do well despite having multiple myeloma and Parkinson's disease.  Otherwise, there have been no changes to his past medical history, surgical history, family history, or social history.    Review of systems otherwise negative other than that mentioned in the HPI.    Objective:   Blood pressure 130/78, pulse 92,  temperature 98.3 F (36.8 C), temperature source Temporal, resp. rate 18, height 5' 9.69 (1.77 m), weight 216 lb 8 oz (98.2 kg), SpO2 95%. Body mass index is 31.35 kg/m.    Physical Exam Vitals reviewed.  Constitutional:      Appearance: He is well-developed.  HENT:     Head: Normocephalic and atraumatic.     Right Ear: Tympanic membrane, ear canal and external ear normal. No drainage, swelling or tenderness. Tympanic membrane is not injected, scarred, erythematous, retracted or bulging.     Left Ear: Tympanic membrane, ear canal and external ear normal. No  drainage, swelling or tenderness. Tympanic membrane is not injected, scarred, erythematous, retracted or bulging.     Nose: No nasal deformity, septal deviation, mucosal edema or rhinorrhea.     Right Turbinates: Enlarged, swollen and pale.     Left Turbinates: Enlarged, swollen and pale.     Right Sinus: No maxillary sinus tenderness or frontal sinus tenderness.     Left Sinus: No maxillary sinus tenderness or frontal sinus tenderness.     Comments: No polyps.  Very hoarse.    Mouth/Throat:     Lips: Pink.     Mouth: Mucous membranes are moist. Mucous membranes are not pale and not dry.     Pharynx: Uvula midline.  Eyes:     General: Lids are normal. Allergic shiner present.        Right eye: No discharge.        Left eye: No discharge.     Conjunctiva/sclera: Conjunctivae normal.     Right eye: Right conjunctiva is not injected. No chemosis.    Left eye: Left conjunctiva is not injected. No chemosis.    Pupils: Pupils are equal, round, and reactive to light.  Cardiovascular:     Rate and Rhythm: Normal rate and regular rhythm.     Heart sounds: Normal heart sounds.  Pulmonary:     Effort: Pulmonary effort is normal. No tachypnea, accessory muscle usage or respiratory distress.     Breath sounds: Normal breath sounds. No wheezing, rhonchi or rales.     Comments: Moving air well in all lung fields.  No increased work of breathing. Chest:     Chest wall: No tenderness.  Abdominal:     Tenderness: There is no abdominal tenderness. There is no guarding or rebound.  Lymphadenopathy:     Head:     Right side of head: No submandibular, tonsillar or occipital adenopathy.     Left side of head: No submandibular, tonsillar or occipital adenopathy.     Cervical: No cervical adenopathy.  Skin:    Coloration: Skin is not pale.     Findings: No abrasion, erythema, petechiae or rash. Rash is not papular, urticarial or vesicular.  Neurological:     Mental Status: He is alert.  Psychiatric:         Behavior: Behavior is cooperative.      Diagnostic studies:  Spirometry: Patient declined       Marty Shaggy, MD  [facility] 

## 2024-05-18 ENCOUNTER — Encounter: Payer: Self-pay | Admitting: Adult Health

## 2024-05-18 ENCOUNTER — Telehealth: Payer: Self-pay | Admitting: Adult Health

## 2024-05-18 DIAGNOSIS — Z0389 Encounter for observation for other suspected diseases and conditions ruled out: Secondary | ICD-10-CM

## 2024-05-18 NOTE — Progress Notes (Signed)
 Patient forgot about today's appointment. Was at the gym and asked to R/S appt.

## 2024-05-20 ENCOUNTER — Other Ambulatory Visit: Payer: Self-pay | Admitting: Family Medicine

## 2024-05-20 DIAGNOSIS — F5104 Psychophysiologic insomnia: Secondary | ICD-10-CM

## 2024-05-23 ENCOUNTER — Telehealth (INDEPENDENT_AMBULATORY_CARE_PROVIDER_SITE_OTHER): Admitting: Adult Health

## 2024-05-23 ENCOUNTER — Encounter: Payer: Self-pay | Admitting: Adult Health

## 2024-05-23 DIAGNOSIS — F6081 Narcissistic personality disorder: Secondary | ICD-10-CM

## 2024-05-23 DIAGNOSIS — F3181 Bipolar II disorder: Secondary | ICD-10-CM | POA: Diagnosis not present

## 2024-05-23 DIAGNOSIS — F4310 Post-traumatic stress disorder, unspecified: Secondary | ICD-10-CM

## 2024-05-23 DIAGNOSIS — F602 Antisocial personality disorder: Secondary | ICD-10-CM

## 2024-05-23 DIAGNOSIS — F603 Borderline personality disorder: Secondary | ICD-10-CM

## 2024-05-23 MED ORDER — RISPERIDONE 1 MG PO TABS: 1.0000 mg | ORAL_TABLET | Freq: Every day | ORAL | 2 refills | Status: DC

## 2024-05-23 NOTE — Progress Notes (Signed)
 Troy [name] [id number] 01/21/1976 48 y.o.  Virtual Visit via Video Note  I connected with pt @ on 05/23/24 at 11:30 AM EDT by a video enabled telemedicine application and verified that I am speaking with the correct person using two identifiers.   I discussed the limitations of evaluation and management by telemedicine and the availability of in person appointments. The patient expressed understanding and agreed to proceed.  I discussed the assessment and treatment plan with the patient. The patient was provided an opportunity to ask questions and all were answered. The patient agreed with the plan and demonstrated an understanding of the instructions.   The patient was advised to call back or seek an in-person evaluation if the symptoms worsen or if the condition fails to improve as anticipated.  I provided 25 minutes of non-face-to-face time during this encounter.  The patient was located at home.  The provider was located at [facility].   Angeline LOISE Sayers, NP   Subjective:   Patient ID:  Troy [name] is a 48 y.o. (DOB 04-Oct-1976) male.  Chief Complaint: No chief complaint on file.   HPI Troy [name] presents for follow-up of BPD, BPD2, PTSD, antisocial personality disorder, narcisscitic PD.  Describes mood today as ok. Pleasant. Denies tearfulness. Mood symptoms - denies depression. Reports a little bit of anxiety. Reports irritability at times. Reports stable interest and motivation. Denies recent panic attack. Reports over thinking and rumination about any and everything. Denies worry. Reports obsessions - weight lifting. Reports decreased mood swings. Stating I feel like I'm doing pretty good. Reports taking current medications as prescribed. Energy levels stable. Active, has a regular exercise routine - power lifting. Enjoys some usual interests and activities. Single. Lives alone in an apartment. Parents local. Has 2 sibling - estranged. Spending  time with family. Appetite adequate. Weight stable. Reports difficulties getting to and staying asleep. Averages 4 to 8 hours. Not using CPAP machine consistently. Reports focus and concentration baseline - not good, but not terrible either. Completing tasks. Managing aspects of household. Reports he is permanently and totally disabled - strokes. Denies SI.  Denies HI.  Denies AH or VH. Denies self harm. Reports substance use smoking THC. 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 Denies alcohol use.  Previous medication trials:  Trazadone, Gabapentin , Depakote, Wellbutrin , Zoloft , Sonata , Seroquel     Review of Systems:  Review of Systems  Musculoskeletal:  Negative for gait problem.  Neurological:  Negative for tremors.  Psychiatric/Behavioral:         Please refer to HPI    Medications: I have reviewed the patient's current medications.  Current Outpatient Medications  Medication Sig Dispense Refill   ammonium lactate (LAC-HYDRIN) 12 % lotion Apply 1 application topically as needed for dry skin.     aspirin  325 MG tablet Take 1 tablet (325 mg total) by mouth daily. 30 tablet 0   Azelastine  HCl 137 MCG/SPRAY SOLN Place 2 sprays into the nose 2 (two) times daily as needed. 30 mL 5   baclofen  (LIORESAL ) 10 MG tablet Take 1 tablet (10 mg total) by mouth 3 (three) times daily as needed. 30 tablet 5   bisoprolol  (ZEBETA ) 5 MG tablet TAKE 1 TABLET BY MOUTH AT BEDTIME 90 tablet 1   budesonide  (PULMICORT ) 0.5 MG/2ML nebulizer solution Mix with Xopenex  nebulizer solution every 6 hours as needed. 120 mL 2   clindamycin  (CLEOCIN  T) 1 % lotion Apply topically as needed (rash). 60 mL 6  doxycycline  (VIBRA -TABS) 100 MG tablet Take 1 tablet (100 mg total) by mouth daily. TAKE WITH HEAVY MEAL 30 tablet 4   EPINEPHrine  0.3 mg/0.3 mL IJ SOAJ injection Inject 0.3 mg into the muscle as needed for anaphylaxis. 2 each 2   esomeprazole  (NEXIUM ) 40 MG capsule TAKE 1 CAPSULE BY MOUTH TWICE DAILY  BEFORE A MEAL 180 capsule 3   Fluticasone -Umeclidin-Vilant (TRELEGY ELLIPTA ) 200-62.5-25 MCG/ACT AEPB INHALE 1 PUFF INTO LUNGS ONCE DAILY 60 each 5   furosemide  (LASIX ) 20 MG tablet Take 1 tablet (20 mg total) by mouth 3 (three) times a week. 36 tablet 0   gabapentin  (NEURONTIN ) 300 MG capsule TAKE 2 CAPSULES BY MOUTH IN THE MORNING AND 2 IN THE AFTERNOON AND 4 CAPSULES AT BEDTIME 240 capsule 3   hydrocortisone  (ANUSOL -HC) 25 MG suppository Place 1 suppository (25 mg total) rectally 2 (two) times daily. 12 suppository 0   hydrOXYzine  (VISTARIL ) 25 MG capsule Take 1 capsule (25 mg total) by mouth every 8 (eight) hours as needed. 30 capsule 0   levalbuterol  (XOPENEX  HFA) 45 MCG/ACT inhaler INHALE 2 PUFFS BY MOUTH EVERY 4 HOURS AS NEEDED FOR WHEEZING 30 g 0   levalbuterol  (XOPENEX ) 1.25 MG/3ML nebulizer solution Take 1.25 mg by nebulization every 4 (four) hours as needed for wheezing. 120 mL 1   levothyroxine  (SYNTHROID ) 300 MCG tablet Take 300 mcg by mouth daily before breakfast.     LUER LOCK SAFETY SYRINGES 22G X 1-1/2 3 ML MISC USE FOR SELF ADMINISTER OF TESTOSTERONE  2CC ONCE EVERY 2 WEEKS     Melatonin 10 MG TABS Take by mouth at bedtime.     Multiple Vitamin (MULTI-VITAMIN) tablet Take 1 tablet by mouth daily.     ondansetron  (ZOFRAN ) 4 MG tablet Take 1 tablet (4 mg total) by mouth every 8 (eight) hours as needed for vomiting or nausea. 20 tablet 0   REPATHA  SURECLICK 140 MG/ML SOAJ INJECT 140 MG  SUBCUTANEOUSLY ONCE EVERY 14 DAYS 6 mL 0   risperiDONE  (RISPERDAL ) 1 MG tablet Take 1 tablet (1 mg total) by mouth at bedtime. 30 tablet 1   rosuvastatin  (CRESTOR ) 40 MG tablet Take 1 tablet by mouth once daily 90 tablet 2   sacubitril-valsartan (ENTRESTO ) 49-51 MG Take 1 tablet by mouth 2 (two) times daily. 180 tablet 3   Suvorexant  (BELSOMRA ) 10 MG TABS TAKE 1 TABLET BY MOUTH AT BEDTIME 30 tablet 5   Suvorexant  15 MG TABS Take 1 tablet (15 mg total) by mouth at bedtime. 30 tablet 2   tamsulosin   (FLOMAX ) 0.4 MG CAPS capsule Take 1 capsule by mouth once daily 90 capsule 3   testosterone  cypionate (DEPOTESTOSTERONE CYPIONATE) 200 MG/ML injection Inject 0.6 mLs (120 mg total) into the muscle once a week. 10 mL 0   traZODone  (DESYREL ) 150 MG tablet TAKE 2 TABLETS BY MOUTH AT BEDTIME 180 tablet 3   triamcinolone  (NASACORT ALLERGY 24HR) 55 MCG/ACT AERO nasal inhaler Place 2 sprays into the nose daily.     No current facility-administered medications for this visit.    Medication Side Effects: None  Allergies:  Allergies  Allergen Reactions   Ciprofloxacin Other (See Comments)    Spontaneous rupture of tendon   Emelia Sprawls Officinalis] Anaphylaxis and Hives   Fluocinolone Other (See Comments)    spontaneous rupture of tendons per pt dr.   Jere  Nausea And Vomiting    abscess  Patient refuses to give further information.   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   CHF (congestive heart failure) (HCC)    Chronic fatigue    Depression    Drug abuse (HCC)    Ectopic atrial rhythm    Family history of adverse reaction to anesthesia    Dad  has problem with lots of medications   Family history of breast cancer 05/19/2021   Family history of colon cancer in mother    GERD (gastroesophageal reflux disease)    +Barretts   Hematochezia    hemorrhoids   Hepatic steatosis    +hepatomegaly   History of CVA (cerebrovascular accident)    radiation-induced carotid damage/changes   History of hepatitis C    +treated   History of hiatal hernia    History of Hodgkin's lymphoma    age 74->mantle RT + splenectomy   History of splenectomy    Hodgkin's lymphoma (HCC)    Hyperlipidemia    Hypertension    Insomnia    Male hypogonadism    Moderate persistent asthma    Obesity, Class I, BMI  30-34.9    Papillary thyroid  carcinoma (HCC) 2010   total thyroidectomy + neck dissection   Pneumonia    Postsurgical hypothyroidism    Prediabetes    Pulmonary fibrosis (HCC)    ?rad induced?   Sleep apnea    mild  cpap   Stroke [facility])    x2  2016  october no defecits memory affected   Thyroid  [facility])     Family History  Problem Relation Age of Onset   Breast cancer Mother        dx early 53s   Colon cancer Mother        dx early 40s   Other Mother        NTHL1 homozygous   Sleep apnea Mother    Parkinsonism Father    Multiple myeloma Father        dx 39s   Sleep apnea Father    Breast cancer Maternal Aunt    Stroke Maternal Grandmother    Leukemia Other        dx <20; two paternal first cousins once removed    Social History   Socioeconomic History   Marital status: Single    Spouse name: Not on file   Number of children: 0   Years of education: Not on file   Highest education level: Not on file  Occupational History   Not on file  Tobacco Use   Smoking status: Former    Current packs/day: 0.00    Types: Cigarettes    Quit date: 2013    Years since quitting: 12.6   Smokeless tobacco: Never  Vaping Use   Vaping status: Some Days  Substance and Sexual Activity   Alcohol use: Not Currently    Comment: occasionally   Drug use: Yes    Types: Marijuana    Comment: pt states I'm uncomfortable answering this question   Sexual activity: Yes  Other Topics Concern   Not on file  Social History Narrative   Are you right handed or left handed? Right   Are you currently employed ? No   What is your current occupation? None   Do you live at home alone? yes   Who lives  with you? no   What type of home do you live in: 1 story or 2 story? 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 : Never true  Transportation Needs: No Transportation Needs (10/24/2023)   Received from [employer]    In the past 12 months, has lack of reliable transportation kept you from medical appointments, meetings, work or from getting things needed for daily living? : No  Physical Activity: Not on file  Stress: Not on file  Social Connections: Not on file  Intimate Partner Violence: Not on file    Past Medical History, Surgical history, Social history, and Family history were reviewed and updated as appropriate.   Please see review of systems for further details on the patient's review from today.   Objective:   Physical Exam:  There were no vitals taken for this visit.  Physical Exam Constitutional:      General: He is not in acute distress. Musculoskeletal:        General: No deformity.  Neurological:     Mental Status: He is alert and oriented to person, place, and time.     Coordination: Coordination normal.  Psychiatric:        Attention and Perception: Attention and perception normal. He does not perceive auditory or visual hallucinations.        Mood and Affect: Mood normal. Mood is not anxious or depressed. Affect is not labile, blunt, angry or inappropriate.        Speech: Speech normal.        Behavior: Behavior normal.        Thought Content: Thought content normal. Thought content is not paranoid or delusional. Thought content does not include homicidal or suicidal ideation. Thought content does not include homicidal or suicidal plan.        Cognition and Memory: Cognition and memory normal.        Judgment: Judgment normal.     Comments: Insight intact     Lab  Review:     Component Value Date/Time   NA 136 12/14/2023 1503   NA 147 (H) 09/12/2023 1026   NA 134 (L) 12/10/2013 2132   K 4.7 12/14/2023 1503   K 4.5 12/10/2013 2132   CL 104 12/14/2023 1503   CL 104 12/10/2013 2132   CO2 22 12/14/2023 1503   CO2 26 12/10/2013 2132   GLUCOSE 92 12/14/2023 1503   GLUCOSE 86 12/10/2013 2132   BUN 16 12/14/2023 1503   BUN 19 09/12/2023 1026   BUN 15 12/10/2013 2132   CREATININE 0.92 12/14/2023 1503   CREATININE 1.14 12/10/2013 2132   CALCIUM  8.3 (L) 12/14/2023 1503   CALCIUM  8.2 (L) 12/10/2013 2132   PROT 7.0 09/12/2023 1026   PROT 7.8 12/10/2013 2132   ALBUMIN 4.3 09/12/2023 1026   ALBUMIN 3.5 12/10/2013 2132  AST 67 (H) 09/12/2023 1026   AST 30 12/10/2013 2132   ALT 107 (H) 09/12/2023 1026   ALT 32 12/10/2013 2132   ALKPHOS 169 (H) 09/12/2023 1026   ALKPHOS 47 12/10/2013 2132   BILITOT 0.5 09/12/2023 1026   BILITOT 0.3 12/10/2013 2132   GFRNONAA >60 12/14/2023 1503   GFRNONAA >60 12/10/2013 2132   GFRAA >60 12/19/2018 1152   GFRAA >60 12/10/2013 2132       Component Value Date/Time   WBC 13.2 (H) 02/07/2024 1336   WBC 9.7 12/14/2023 1503   RBC 4.91 02/07/2024 1336   HGB 16.6 02/07/2024 1336   HGB 17.3 09/05/2023 1017   HCT 46.3 02/07/2024 1336   HCT 49.9 09/05/2023 1017   PLT 233 02/07/2024 1336   PLT 227 09/05/2023 1017   MCV 94.3 02/07/2024 1336   MCV 96 09/05/2023 1017   MCV 93 12/10/2013 2132   MCH 33.8 02/07/2024 1336   MCHC 35.9 02/07/2024 1336   RDW 17.2 (H) 02/07/2024 1336   RDW 17.2 (H) 09/05/2023 1017   RDW 16.0 (H) 12/10/2013 2132   LYMPHSABS 1.4 02/07/2024 1336   LYMPHSABS 3.3 (H) 08/12/2020 1436   MONOABS 1.6 (H) 02/07/2024 1336   EOSABS 0.3 02/07/2024 1336   EOSABS 0.5 (H) 08/12/2020 1436   BASOSABS 0.1 02/07/2024 1336   BASOSABS 0.1 08/12/2020 1436    No results found for: POCLITH, LITHIUM   No results found for: PHENYTOIN, PHENOBARB, VALPROATE, CBMZ   .res Assessment: Plan:     Plan:  PDMP reviewed  Risperdal  1mg  at bedtime  RTC 3 months  25 minutes spent dedicated to the care of this patient on the date of this encounter to include pre-visit review of records, ordering of medication, post visit documentation, and face-to-face time with the patient discussing BPD, BPD2, PTSD, antisocial personality disorder, narcisscitic PD. Discussed continuing Risperdal  1mg  at bedtime.   Patient advised to contact office with any questions, adverse effects, or acute worsening in signs and symptoms.  There are no diagnoses linked to this encounter.   Please see After Visit Summary for patient specific instructions.  Future Appointments  Date Time Provider Department Center  05/23/2024 11:30 AM Jatasia Gundrum, Angeline Mattocks, NP CP-CP None  06/05/2024  1:00 PM Schuyler Damien HERO [facility]  08/08/2024  3:00 PM [facility] LAB [facility] None  08/08/2024  3:45 PM [facility] INFUSION [facility] None  09/26/2024  3:00 PM Thedora Garnette HERO, MD [facility] PEC  11/21/2024  2:45 PM Whitfield Raisin, NP [facility] None  11/27/2024 11:15 AM Iva Marty Saltness, MD [facility] None    No orders of the defined types were placed in this encounter.     -------------------------------

## 2024-05-26 DIAGNOSIS — G4733 Obstructive sleep apnea (adult) (pediatric): Secondary | ICD-10-CM | POA: Diagnosis not present

## 2024-05-27 ENCOUNTER — Encounter: Payer: Self-pay | Admitting: Family Medicine

## 2024-05-27 DIAGNOSIS — F5104 Psychophysiologic insomnia: Secondary | ICD-10-CM

## 2024-05-28 MED ORDER — SUVOREXANT 15 MG PO TABS: 15.0000 mg | ORAL_TABLET | Freq: Every day | ORAL | 2 refills | Status: DC

## 2024-05-28 NOTE — Telephone Encounter (Signed)
Please review and advise. Thanks. Dm/cma  

## 2024-05-29 ENCOUNTER — Other Ambulatory Visit: Payer: Self-pay | Admitting: Allergy & Immunology

## 2024-06-01 ENCOUNTER — Other Ambulatory Visit: Payer: Self-pay | Admitting: Internal Medicine

## 2024-06-05 ENCOUNTER — Ambulatory Visit (INDEPENDENT_AMBULATORY_CARE_PROVIDER_SITE_OTHER): Admitting: Licensed Clinical Social Worker

## 2024-06-05 ENCOUNTER — Telehealth: Payer: Self-pay | Admitting: Neurology

## 2024-06-05 DIAGNOSIS — Z8659 Personal history of other mental and behavioral disorders: Secondary | ICD-10-CM | POA: Diagnosis not present

## 2024-06-05 DIAGNOSIS — F4310 Post-traumatic stress disorder, unspecified: Secondary | ICD-10-CM

## 2024-06-05 DIAGNOSIS — F603 Borderline personality disorder: Secondary | ICD-10-CM | POA: Diagnosis not present

## 2024-06-05 DIAGNOSIS — F3181 Bipolar II disorder: Secondary | ICD-10-CM

## 2024-06-05 NOTE — Telephone Encounter (Signed)
 Pt called stating that [employer] informed him that the cpap supply Rx was done wrong and it needs to be resent so that he can get his Cpap supplies. Pt gave this Fax-[phone]
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
## 2024-06-06 NOTE — Telephone Encounter (Signed)
 I called [employer] spoke to [facility] with resupply [phone].  Pt has received supplies 05-07-2024 , order from [employer] on 04-22-2024 (would not have sent supplies if issue with prescription).  I did fax over the last OV for pt 04-25-2024 fax [phone]. (Received). I relayed to pt (I told him that I feel that he is ok, could not find issue when called [employer]).  He appreciated call back with information.  He did received his supplies.

## 2024-06-06 NOTE — Telephone Encounter (Signed)
 We signed the automated cpap supply request on [employer] from [employer] that we cannot change. Will contact [employer] to see what they are referring to.

## 2024-06-07 DIAGNOSIS — G4733 Obstructive sleep apnea (adult) (pediatric): Secondary | ICD-10-CM | POA: Diagnosis not present

## 2024-06-17 ENCOUNTER — Other Ambulatory Visit: Payer: Self-pay | Admitting: Allergy & Immunology

## 2024-06-26 ENCOUNTER — Ambulatory Visit: Admitting: Licensed Clinical Social Worker

## 2024-06-26 DIAGNOSIS — F4310 Post-traumatic stress disorder, unspecified: Secondary | ICD-10-CM | POA: Diagnosis not present

## 2024-06-26 DIAGNOSIS — F3181 Bipolar II disorder: Secondary | ICD-10-CM | POA: Diagnosis not present

## 2024-06-26 DIAGNOSIS — F603 Borderline personality disorder: Secondary | ICD-10-CM

## 2024-06-26 DIAGNOSIS — G4733 Obstructive sleep apnea (adult) (pediatric): Secondary | ICD-10-CM | POA: Diagnosis not present
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
## 2024-06-27 ENCOUNTER — Other Ambulatory Visit: Payer: Self-pay | Admitting: Internal Medicine

## 2024-06-27 DIAGNOSIS — I700 Atherosclerosis of aorta: Secondary | ICD-10-CM

## 2024-06-27 DIAGNOSIS — I2510 Atherosclerotic heart disease of native coronary artery without angina pectoris: Secondary | ICD-10-CM

## 2024-06-27 DIAGNOSIS — E785 Hyperlipidemia, unspecified: Secondary | ICD-10-CM

## 2024-07-04 ENCOUNTER — Ambulatory Visit: Admitting: Neurology

## 2024-07-09 ENCOUNTER — Ambulatory Visit: Payer: Self-pay

## 2024-07-09 NOTE — Telephone Encounter (Signed)
 FYI Only or Action Required?: FYI only for provider.  Patient was last seen in primary care on 03/27/2024 by Thedora Garnette HERO, MD.  Called Nurse Triage reporting No chief complaint on file..  Symptoms began a week ago.  Interventions attempted: Nothing.  Symptoms are: unchanged.  Triage Disposition: No disposition on file.  Patient/caregiver understands and will follow disposition?:   **See note below**        Copied from CRM #[id number]. Topic: Clinical - Red Word Triage >> Jul 09, 2024  4:41 PM Taleah C wrote: Red Word that prompted transfer to Nurse Triage: bloody stool Reason for Disposition  [1] Caller requesting NON-URGENT health information AND [2] PCP's office is the best resource  Answer Assessment - Initial Assessment Questions 1. REASON FOR CALL: What is the main reason for your call? or How can I best help you?    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 Routing to the office for follow-up  Protocols used: Information Only Call - No Triage-A-AH

## 2024-07-09 NOTE — Telephone Encounter (Signed)
 Noted. Appointment made by patient for 07/11/24.

## 2024-07-10 ENCOUNTER — Ambulatory Visit (INDEPENDENT_AMBULATORY_CARE_PROVIDER_SITE_OTHER): Admitting: Licensed Clinical Social Worker

## 2024-07-10 DIAGNOSIS — F4310 Post-traumatic stress disorder, unspecified: Secondary | ICD-10-CM

## 2024-07-10 DIAGNOSIS — F603 Borderline personality disorder: Secondary | ICD-10-CM | POA: Diagnosis not present

## 2024-07-10 DIAGNOSIS — F3181 Bipolar II disorder: Secondary | ICD-10-CM | POA: Diagnosis not present

## 2024-07-10 NOTE — Progress Notes (Signed)
  Behavioral Health Counselor/Therapist Progress Note  Patient ID: DEMITRIOS MOLYNEUX, MRN: [id number]    Date: 07/10/24  Time Spent: 1100  am - 1137 am : 37 Minutes  Treatment Type: Individual Therapy.  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 Patient reports both parents are ill.    Mental Status Exam: Appearance:  Casual     Behavior: Appropriate  Motor: Normal  Speech/Language:  Clear and Coherent  Affect: Appropriate  Mood: normal  Thought process: normal  Thought content:   WNL  Sensory/Perceptual disturbances:   WNL  Orientation: oriented to person, place, time/date, situation, day of week, month of year, and year  Attention: Good  Concentration: Good  Memory: WNL  Fund of knowledge:  Good  Insight:   Good  Judgment:  Good  Impulse Control: Good    Risk Assessment: Danger to Self:  No Self-injurious Behavior: No Danger to Others: No Duty to Warn:no Physical Aggression / Violence:No  Access to Firearms a concern: No  Gang Involvement:No    Subjective:    [name] participated via video from his home. Elisah is aware of risk and limitations and consented to treatment. Therapist participated from home  office.   Arsen presented for his session in a positive mood. He reports that he has another appointment at 11:45 and must be gone for that. Patient reports that he has had some bleeding from hemorrhoids  and is concerned. He reports that he has a doctor appointment because its been a couple of weeks. Patient states that he still hasn't made amends with his siblings and his mother continues to remind him that he needs to make amends. Patient reports that the two girls he has been talking to has not been very healthy. He reports being lonely and not really  into either of them. He states he goes to the gym, visits his parents and is alone most of time.  Clinician actively listened and provided encouragement and feedback. Clinician pointed out that being lonely can place us  in relationships that aren't necessarily healthy. We processed that he is settling and that isn't fair to him or the girls he is talking to. We discussed becoming involved in a volunteer project in the community to meet people and build a community with others who are wanting to do good in the community. Clinician pointed out that with the  holidays approaching there will be several organizations looking for volunteers. Clinician also encouraged patient to think of ways he can begin to have interaction with his siblings without conflict.   Emmanuel was fully engaged in session and was motivated for treatment. He is transparent and is honest about his acting out behaviors the purpose behind them. Treyton is working toward [employer]. He will continue to use CBT, mindfulness and coping skills to help manage decrease symptoms associated with their diagnosis. Treatment planning /to be reviewed by 05/02/2025.  Interventions: Cognitive Behavioral Therapy, Dialectical Behavioral Therapy, Assertiveness/Communication, Motivational Interviewing, and Solution-Oriented/Positive Psychology  Diagnosis: PTSD, Bipolar II Disorder, Borderline Personality Disorder   [name] MSW, LCSW/DATE 07/10/2024

## 2024-07-11 ENCOUNTER — Encounter: Payer: Self-pay | Admitting: Family Medicine

## 2024-07-11 ENCOUNTER — Ambulatory Visit (INDEPENDENT_AMBULATORY_CARE_PROVIDER_SITE_OTHER)

## 2024-07-11 ENCOUNTER — Ambulatory Visit: Admitting: Family Medicine

## 2024-07-11 ENCOUNTER — Other Ambulatory Visit (HOSPITAL_BASED_OUTPATIENT_CLINIC_OR_DEPARTMENT_OTHER): Payer: Self-pay

## 2024-07-11 ENCOUNTER — Encounter: Payer: Self-pay | Admitting: Hematology

## 2024-07-11 VITALS — BP 120/74 | HR 100 | Temp 97.6°F | Ht 69.0 in | Wt 216.4 lb

## 2024-07-11 DIAGNOSIS — R059 Cough, unspecified: Secondary | ICD-10-CM | POA: Diagnosis not present

## 2024-07-11 DIAGNOSIS — Z23 Encounter for immunization: Secondary | ICD-10-CM

## 2024-07-11 DIAGNOSIS — K625 Hemorrhage of anus and rectum: Secondary | ICD-10-CM

## 2024-07-11 DIAGNOSIS — R052 Subacute cough: Secondary | ICD-10-CM

## 2024-07-11 DIAGNOSIS — J8410 Pulmonary fibrosis, unspecified: Secondary | ICD-10-CM | POA: Diagnosis not present

## 2024-07-11 DIAGNOSIS — J45909 Unspecified asthma, uncomplicated: Secondary | ICD-10-CM | POA: Diagnosis not present

## 2024-07-11 HISTORY — PX: HEMORRHOID BANDING: SHX5850

## 2024-07-11 MED ORDER — HYDROCORTISONE ACETATE 25 MG RE SUPP
25.0000 mg | Freq: Two times a day (BID) | RECTAL | 0 refills | Status: AC
  Filled 2024-07-11: qty 12, 6d supply, fill #0

## 2024-07-11 NOTE — Progress Notes (Signed)
 [facility] [address] [place] [address] Dept: [phone] Dept Fax: [phone]  Office Visit  Subjective:    Patient ID: Troy [name], male    DOB: 03-27-1976, 48 y.o..   MRN: [id number]  Chief Complaint  Patient presents with   Rectal Bleeding    C/o having blood in stools x 1 week.  Cough x 1-2 months. Wants flu shot    History of Present Illness:  Patient is in today complaining of bright red blood per rectum over the past week. He has a past history of an internal hemorrhoid and has had some occasional spotting of blood. However, he notes this has been quite a bit more than int he past. He has had prior colon polyps, though it has been some years since his last colonoscopy.   Troy [name] also notes he has had some increased cough over the past 2 months. He finds he has to do a deep cough to bring up mucous, which can often be dark in color. He has not run any fever.  Past Medical History: Patient Active Problem List   Diagnosis Date Noted   PTSD (post-traumatic stress disorder) 05/16/2024   Bipolar II disorder (HCC) 05/16/2024   Borderline personality disorder (HCC) 05/16/2024   Rupture of anterior cruciate ligament of both knees 08/18/2023   Scrotal abscess 08/06/2023   Polycythemia, secondary 04/22/2023   Moderate persistent asthma with exacerbation 03/29/2023   SVT (supraventricular tachycardia) 12/30/2022   Aortic atherosclerosis 12/30/2022   HFrEF (heart failure with reduced ejection fraction) (HCC) 12/30/2022   Blood pressure alteration 12/30/2022   Erythrocytosis 11/13/2022   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   Lower urinary tract symptoms (LUTS) 06/02/2022   Rotator cuff tear, right 03/03/2022   History of squamous cell carcinoma in situ (SCCIS) of skin 02/22/2022   Osteoarthritis of acromioclavicular joint 10/15/2021   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    History of splenectomy 02/02/2021   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   Erectile dysfunction 02/02/2021   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   Pulmonary fibrosis (HCC) 02/02/2021   Peripheral neuropathy 02/02/2021   History of hepatitis C 02/02/2021   Back pain 02/02/2021   Hyperlipidemia 02/02/2021   Moderate persistent asthma, uncomplicated 08/13/2020   History of Hodgkin's lymphoma 08/13/2020   Chronic rhinitis 08/13/2020   Not currently working due to disabled status 08/13/2020   Pain in joint of left knee 07/25/2020   History of thyroid  cancer 06/09/2020   Arthralgia of left elbow 05/01/2020   Autonomic dysfunction 04/23/2020   Iron deficiency anemia due to chronic blood loss 01/18/2020   Microalbuminuria 01/18/2020   Chronic insomnia 01/17/2020   Large liver 01/17/2020   Prediabetes 01/11/2019   Daytime somnolence 07/22/2017   History of basal cell carcinoma (BCC) 11/30/2016   GERD (gastroesophageal reflux disease) 08/25/2016   Essential hypertension 08/25/2016   Substance induced mood disorder (HCC)    History of stroke    Past Surgical History:  Procedure Laterality Date   COLONOSCOPY  07/04/2019   done for hematochezia and FH col ca-mother: Mod int hem w/inflamm.  No adenomatous polyps.   INCISIONAL HERNIA REPAIR  06/2019   RUQ (prev lap append site)   KNEE ARTHROSCOPY WITH MENISCAL REPAIR Right 12/23/2023   Procedure: KNEE ARTHROSCOPY WITH POSSIBLE LATERAL  MENISCAL REPAIR;  Surgeon: Sharl Selinda Dover, MD;  Location: [facility];  Service: Orthopedics;  Laterality: Right;   LAPAROSCOPIC APPENDECTOMY N/A 03/25/2017  Procedure: APPENDECTOMY LAPAROSCOPIC; EXTENSIVE LYSIS OF ADHESIONS FROM PRIOR RADIATION AND SURGERY;  Surgeon: Gladis Cough, MD;  Location: [facility];  Service: General;  Laterality: N/A;   MENISCUS REPAIR Left 09/16/2023   Procedure: REPAIR OF LATERAL MENISCUS;  Surgeon: Sharl Selinda Dover, MD;  Location: [facility];  Service: Orthopedics;   Laterality: Left;  120 block in preop   PARATHYROIDECTOMY     SPLENECTOMY     TEE WITHOUT CARDIOVERSION N/A 07/18/2015   Procedure: TRANSESOPHAGEAL ECHOCARDIOGRAM (TEE);  Surgeon: Aleene JINNY Passe, MD;  Location: [facility];  Service: Cardiovascular;  Laterality: N/A;   THYROIDECTOMY     Family History  Problem Relation Age of Onset   Breast cancer Mother        dx early 104s   Colon cancer Mother        dx early 62s   Other Mother        NTHL1 homozygous   Sleep apnea Mother    Parkinsonism Father    Multiple myeloma Father        dx 85s   Sleep apnea Father    Breast cancer Maternal Aunt    Stroke Maternal Grandmother    Leukemia Other        dx <20; two paternal first cousins once removed   Outpatient Medications Prior to Visit  Medication Sig Dispense Refill   ammonium lactate (LAC-HYDRIN) 12 % lotion Apply 1 application topically as needed for dry skin.     aspirin  325 MG tablet Take 1 tablet (325 mg total) by mouth daily. 30 tablet 0   Azelastine  HCl 137 MCG/SPRAY SOLN Place 2 sprays into the nose 2 (two) times daily as needed. 30 mL 5   baclofen  (LIORESAL ) 10 MG tablet Take 1 tablet (10 mg total) by mouth 3 (three) times daily as needed. 30 tablet 5   bisoprolol  (ZEBETA ) 5 MG tablet TAKE 1 TABLET BY MOUTH AT BEDTIME 90 tablet 1   budesonide  (PULMICORT ) 0.5 MG/2ML nebulizer solution Mix with Xopenex  nebulizer solution every 6 hours as needed. 120 mL 2   clindamycin  (CLEOCIN  T) 1 % lotion Apply topically as needed (rash). 60 mL 6   doxycycline  (VIBRA -TABS) 100 MG tablet Take 1 tablet (100 mg total) by mouth daily. TAKE WITH HEAVY MEAL 30 tablet 4   EPINEPHrine  0.3 mg/0.3 mL IJ SOAJ injection Inject 0.3 mg into the muscle as needed for anaphylaxis. 2 each 2   esomeprazole  (NEXIUM ) 40 MG capsule TAKE 1 CAPSULE BY MOUTH TWICE DAILY BEFORE A MEAL 180 capsule 3   Fluticasone -Umeclidin-Vilant (TRELEGY ELLIPTA ) 200-62.5-25 MCG/ACT AEPB INHALE 1 PUFF INTO THE LUNGS ONCE DAILY 60 each  1   furosemide  (LASIX ) 20 MG tablet Take 1 tablet (20 mg total) by mouth 3 (three) times a week. 36 tablet 0   gabapentin  (NEURONTIN ) 300 MG capsule TAKE 2 CAPSULES BY MOUTH IN THE MORNING AND 2 IN THE AFTERNOON AND 4 CAPSULES AT BEDTIME 240 capsule 3   hydrocortisone  (ANUSOL -HC) 25 MG suppository Place 1 suppository (25 mg total) rectally 2 (two) times daily. 12 suppository 0   hydrOXYzine  (VISTARIL ) 25 MG capsule Take 1 capsule (25 mg total) by mouth every 8 (eight) hours as needed. 30 capsule 0   levalbuterol  (XOPENEX  HFA) 45 MCG/ACT inhaler INHALE 2 PUFFS BY MOUTH EVERY 4 HOURS AS NEEDED FOR WHEEZING 30 g 0   levalbuterol  (XOPENEX ) 1.25 MG/3ML nebulizer solution Take 1.25 mg by nebulization every 4 (four) hours as needed for wheezing. 120 mL  1   levothyroxine  (SYNTHROID ) 300 MCG tablet Take 300 mcg by mouth daily before breakfast.     LUER LOCK SAFETY SYRINGES 22G X 1-1/2 3 ML MISC USE FOR SELF ADMINISTER OF TESTOSTERONE  2CC ONCE EVERY 2 WEEKS     Melatonin 10 MG TABS Take by mouth at bedtime.     Multiple Vitamin (MULTI-VITAMIN) tablet Take 1 tablet by mouth daily.     ondansetron  (ZOFRAN ) 4 MG tablet Take 1 tablet (4 mg total) by mouth every 8 (eight) hours as needed for vomiting or nausea. 20 tablet 0   REPATHA  SURECLICK 140 MG/ML SOAJ INJECT 140 MG SUBCUTANEOUSLY ONCE EVERY 14 DAY 6 mL 0   risperiDONE  (RISPERDAL ) 1 MG tablet Take 1 tablet (1 mg total) by mouth at bedtime. 30 tablet 2   rosuvastatin  (CRESTOR ) 40 MG tablet Take 1 tablet by mouth once daily 90 tablet 2   sacubitril-valsartan (ENTRESTO ) 49-51 MG Take 1 tablet by mouth 2 (two) times daily. 180 tablet 3   Suvorexant  (BELSOMRA ) 10 MG TABS TAKE 1 TABLET BY MOUTH AT BEDTIME 30 tablet 5   Suvorexant  15 MG TABS Take 1 tablet (15 mg total) by mouth at bedtime. 30 tablet 2   tamsulosin  (FLOMAX ) 0.4 MG CAPS capsule Take 1 capsule by mouth once daily 90 capsule 3   testosterone  cypionate (DEPOTESTOSTERONE CYPIONATE) 200 MG/ML injection  Inject 0.6 mLs (120 mg total) into the muscle once a week. 10 mL 0   traZODone  (DESYREL ) 150 MG tablet TAKE 2 TABLETS BY MOUTH AT BEDTIME 180 tablet 3   triamcinolone  (NASACORT ALLERGY 24HR) 55 MCG/ACT AERO nasal inhaler Place 2 sprays into the nose daily.     No facility-administered medications prior to visit.   Allergies  Allergen Reactions   Ciprofloxacin Other (See Comments)    Spontaneous rupture of tendon   Emelia Sprawls Officinalis] Anaphylaxis and Hives   Fluocinolone Other (See Comments)    spontaneous rupture of tendons per pt dr.   Tonye  Nausea And Vomiting    abscess  Patient refuses to give further information.   Prednisone  Rash     Objective:   Today's Vitals   07/11/24 1505  BP: 120/74  Pulse: 100  Temp: 97.6 F (36.4 C)  TempSrc: Temporal  SpO2: 96%  Weight: 216 lb 6.4 oz (98.2 kg)  Height: 5' 9 (1.753 m)   Body mass index is 31.96 kg/m.   General: Well developed, well nourished. No acute distress. Lungs: Clear to auscultation bilaterally. No wheezing, rales or rhonchi. Rectal: Small , non-inflamed external hemorrhoidal tags. No palpable masses in the rectal vault. Prostate is smooth and   may be mildly enlarged. Hemoccult +. Psych: Alert and oriented. Normal mood and affect.  Health Maintenance Due  Topic Date Due   Hepatitis B Vaccines 19-59 Average Risk (3 of 3 - 19+ 3-dose series) 10/07/2005   Pneumococcal Vaccine (3 of 3 - PCV20 or PCV21) 09/01/2020   Medicare Annual Wellness (AWV)  05/22/2021   Colonoscopy  Never done   Influenza Vaccine  05/11/2024     Imaging: CXR- No sign of an acute pulmonary process.  Assessment & Plan:   Problem List Items Addressed This Visit   None Visit Diagnoses       Bright red blood per rectum    -  Primary   I will refer Troy [name] for more urgent colonoscopy to determine a source for this. I will check a CBC. Rx for Anusol  supp.   Relevant Medications  hydrocortisone  (ANUSOL -HC) 25 MG  suppository   Other Relevant Orders   CBC   Ambulatory referral to Gastroenterology     Subacute cough       Likely represents a bronchitis that is resolving.   Relevant Orders   DG Chest 2 View     Need for immunization against influenza       Relevant Orders   Flu vaccine trivalent PF, 6mos and older(Flulaval,Afluria,Fluarix,Fluzone) (Completed)       Return in about 3 months (around 10/11/2024) for Reassessment.   Garnette CHRISTELLA Simpler, MD

## 2024-07-12 ENCOUNTER — Encounter: Payer: Self-pay | Admitting: Gastroenterology

## 2024-07-12 ENCOUNTER — Telehealth: Payer: Self-pay | Admitting: Gastroenterology

## 2024-07-12 LAB — CBC
HCT: 45.1 % (ref 39.0–52.0)
Hemoglobin: 14.8 g/dL (ref 13.0–17.0)
MCHC: 32.9 g/dL (ref 30.0–36.0)
MCV: 101.3 fl — ABNORMAL HIGH (ref 78.0–100.0)
Platelets: 212 K/uL (ref 150.0–400.0)
RBC: 4.46 Mil/uL (ref 4.22–5.81)
RDW: 15.8 % — ABNORMAL HIGH (ref 11.5–15.5)
WBC: 9.2 K/uL (ref 4.0–10.5)

## 2024-07-12 NOTE — Telephone Encounter (Signed)
 Inbound call from patient wanting to schedule a colonoscopy but patient is having some rectal bleeding about 4 times a day so he would need to be seen in office. Patient would like to see Dr.Gessner but soonest availability was for 09/11/24. Patient did not want to wait that long due to having family history of colon cancer and is worried due to the amount of blood coming out. Patient understands that he is a New Patient to our office but would like to know if theres a sooner availability for him to be seen even if its with a PA. Patient scheduled with Camie Furbish for 08/31/24 Please advise  Thank you

## 2024-07-13 ENCOUNTER — Ambulatory Visit: Payer: Self-pay | Admitting: Family Medicine

## 2024-07-13 NOTE — Telephone Encounter (Signed)
 Called the patient. No answer. Left message for the patient to return our call. Offering an appointment 07/19/24 at 1:30 pm with APP. Asked he call to confirm or decline.  Please cal him again to see if you can reach him. Thanks

## 2024-07-13 NOTE — Telephone Encounter (Signed)
 Called patient, no answer left voice mail to call back and confirm appointment. Thank you

## 2024-07-16 ENCOUNTER — Encounter: Payer: Self-pay | Admitting: Family Medicine

## 2024-07-17 ENCOUNTER — Ambulatory Visit: Admitting: Licensed Clinical Social Worker

## 2024-07-19 ENCOUNTER — Ambulatory Visit (INDEPENDENT_AMBULATORY_CARE_PROVIDER_SITE_OTHER): Admitting: Gastroenterology

## 2024-07-19 ENCOUNTER — Encounter: Payer: Self-pay | Admitting: Gastroenterology

## 2024-07-19 ENCOUNTER — Encounter: Payer: Self-pay | Admitting: Hematology

## 2024-07-19 ENCOUNTER — Other Ambulatory Visit (HOSPITAL_BASED_OUTPATIENT_CLINIC_OR_DEPARTMENT_OTHER): Payer: Self-pay

## 2024-07-19 VITALS — BP 122/78 | HR 94 | Ht 69.0 in | Wt 217.0 lb

## 2024-07-19 DIAGNOSIS — Z85850 Personal history of malignant neoplasm of thyroid: Secondary | ICD-10-CM

## 2024-07-19 DIAGNOSIS — Z860100 Personal history of colon polyps, unspecified: Secondary | ICD-10-CM

## 2024-07-19 DIAGNOSIS — K625 Hemorrhage of anus and rectum: Secondary | ICD-10-CM

## 2024-07-19 DIAGNOSIS — K648 Other hemorrhoids: Secondary | ICD-10-CM | POA: Diagnosis not present

## 2024-07-19 DIAGNOSIS — Z800 Family history of malignant neoplasm of digestive organs: Secondary | ICD-10-CM | POA: Diagnosis not present

## 2024-07-19 DIAGNOSIS — Z7982 Long term (current) use of aspirin: Secondary | ICD-10-CM

## 2024-07-19 MED ORDER — HYDROCORTISONE ACETATE 25 MG RE SUPP
25.0000 mg | Freq: Every evening | RECTAL | 0 refills | Status: AC
  Filled 2024-07-19: qty 10, 10d supply, fill #0

## 2024-07-19 MED ORDER — NA SULFATE-K SULFATE-MG SULF 17.5-3.13-1.6 GM/177ML PO SOLN: 1.0000 | Freq: Once | ORAL | 0 refills | Status: AC

## 2024-07-19 NOTE — Progress Notes (Signed)
 Chief Complaint:Bright red blood per rectum , colonoscopy Primary GI Doctor: Dr. Avram  HPI:  Patient is a  48  year old male patient with past medical history of stroke (2016), thyroid  CA, hypertension, GERD,Bipolar,  Hx of Hodgkin's lymphoma, who was referred to me by Thedora Garnette HERO, MD on 07/11/24 for a evaluation of bright red blood per rectum, colonoscopy  .    Interval History    Patient presents for evaluation of rectal bleeding that started on or around September 22nd. 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  He noted on Monday he felt like he needed to have diarrhea and passed a lot of blood with defecation with blood clots.  He had 10 or more days of rectal bleeding with clots. He notes the bleeding has slowed down but he is still having it. He notes history of hemorrhoids and has used topical ointments in the past. He was recently prescribed suppositories and not covered. He reports he has multiple stools per day, formed stools in am and over the day Troy [name] be more soft. He notes he eats a lot with power lifting which has always kept him regular. He denies abdominal pain. No nausea or vomiting. No fever or chills.He notes has has had a lot of fatigue.   Patient has history of GERD and the esomeprazole  40 mg po daily. Patient denies dysphagia Patient denies nausea, vomiting, or weight loss.  Nonsmoker. No alcohol use.   Patient on daily ASA 325mg  po daily for history of stroke.   Patient's family history includes mother with colon CA, 50's and breast CA- also patient of Dr. 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   CHF (congestive heart failure) (HCC)    Chronic fatigue    Depression    Drug abuse (HCC)    Ectopic atrial rhythm    Family history of adverse reaction to anesthesia    Dad  has problem with lots of medications   Family history of breast cancer 05/19/2021   Family history of colon cancer in mother    GERD (gastroesophageal reflux disease)    +Barretts   Hematochezia    hemorrhoids   Hepatic steatosis    +hepatomegaly   History of CVA (cerebrovascular accident)    radiation-induced carotid damage/changes   History of hepatitis C    +treated   History of hiatal hernia    History of Hodgkin's lymphoma    age 61->mantle RT + splenectomy   History of splenectomy    Hodgkin's lymphoma (HCC)    Hyperlipidemia    Hypertension    Insomnia    Male hypogonadism    Moderate persistent asthma    Obesity, Class I, BMI 30-34.9    Papillary thyroid  carcinoma (HCC) 2010   total thyroidectomy + neck dissection   Pneumonia    Postsurgical hypothyroidism    Prediabetes    Pulmonary fibrosis (HCC)    ?rad induced?   Sleep apnea  mild  cpap   Stroke [facility])    x2  2016  october no defecits memory affected   Thyroid  ca [facility])     Past Surgical History:  Procedure Laterality Date   COLONOSCOPY  07/04/2019   done for hematochezia and FH col ca-mother: Mod int hem w/inflamm.  No adenomatous polyps.   INCISIONAL HERNIA REPAIR  06/2019   RUQ (prev lap append site)   KNEE ARTHROSCOPY WITH MENISCAL REPAIR Right 12/23/2023   Procedure: KNEE ARTHROSCOPY WITH POSSIBLE LATERAL  MENISCAL REPAIR;  Surgeon: Sharl Selinda Dover, MD;  Location: [facility];  Service: Orthopedics;  Laterality: Right;   LAPAROSCOPIC APPENDECTOMY N/A 03/25/2017   Procedure: APPENDECTOMY LAPAROSCOPIC; EXTENSIVE LYSIS OF ADHESIONS FROM PRIOR RADIATION AND SURGERY;  Surgeon: Gladis Cough, MD;  Location: [facility];  Service: General;  Laterality: N/A;   MENISCUS REPAIR Left  09/16/2023   Procedure: REPAIR OF LATERAL MENISCUS;  Surgeon: Sharl Selinda Dover, MD;  Location: [facility];  Service: Orthopedics;  Laterality: Left;  120 block in preop   PARATHYROIDECTOMY     SPLENECTOMY     TEE WITHOUT CARDIOVERSION N/A 07/18/2015   Procedure: TRANSESOPHAGEAL ECHOCARDIOGRAM (TEE);  Surgeon: Aleene JINNY Passe, MD;  Location: [facility];  Service: Cardiovascular;  Laterality: N/A;   THYROIDECTOMY      Current Outpatient Medications  Medication Sig Dispense Refill   ammonium lactate (LAC-HYDRIN) 12 % lotion Apply 1 application topically as needed for dry skin.     aspirin  325 MG tablet Take 1 tablet (325 mg total) by mouth daily. 30 tablet 0   Azelastine  HCl 137 MCG/SPRAY SOLN Place 2 sprays into the nose 2 (two) times daily as needed. 30 mL 5   baclofen  (LIORESAL ) 10 MG tablet Take 1 tablet (10 mg total) by mouth 3 (three) times daily as needed. 30 tablet 5   bisoprolol  (ZEBETA ) 5 MG tablet TAKE 1 TABLET BY MOUTH AT BEDTIME 90 tablet 1   budesonide  (PULMICORT ) 0.5 MG/2ML nebulizer solution Mix with Xopenex  nebulizer solution every 6 hours as needed. 120 mL 2   clindamycin  (CLEOCIN  T) 1 % lotion Apply topically as needed (rash). 60 mL 6   EPINEPHrine  0.3 mg/0.3 mL IJ SOAJ injection Inject 0.3 mg into the muscle as needed for anaphylaxis. 2 each 2   esomeprazole  (NEXIUM ) 40 MG capsule TAKE 1 CAPSULE BY MOUTH TWICE DAILY BEFORE A MEAL 180 capsule 3   Fluticasone -Umeclidin-Vilant (TRELEGY ELLIPTA ) 200-62.5-25 MCG/ACT AEPB INHALE 1 PUFF INTO THE LUNGS ONCE DAILY 60 each 1   furosemide  (LASIX ) 20 MG tablet Take 1 tablet (20 mg total) by mouth 3 (three) times a week. 36 tablet 0   gabapentin  (NEURONTIN ) 300 MG capsule TAKE 2 CAPSULES BY MOUTH IN THE MORNING AND 2 IN THE AFTERNOON AND 4 CAPSULES AT BEDTIME 240 capsule 3   hydrocortisone  (ANUSOL -HC) 25 MG suppository Place 1 suppository (25 mg total) rectally 2 (two) times daily. 12 suppository 0   hydrocortisone   (ANUSOL -HC) 25 MG suppository Place 1 suppository (25 mg total) rectally at bedtime. 10 suppository 0   hydrOXYzine  (VISTARIL ) 25 MG capsule Take 1 capsule (25 mg total) by mouth every 8 (eight) hours as needed. 30 capsule 0   levalbuterol  (XOPENEX  HFA) 45 MCG/ACT inhaler INHALE 2 PUFFS BY MOUTH EVERY 4 HOURS AS NEEDED FOR WHEEZING 30 g 0   levalbuterol  (XOPENEX ) 1.25 MG/3ML nebulizer solution Take 1.25 mg by nebulization every 4 (four) hours as needed for wheezing. 120 mL 1   levothyroxine  (SYNTHROID ) 300  MCG tablet Take 300 mcg by mouth daily before breakfast.     LUER LOCK SAFETY SYRINGES 22G X 1-1/2 3 ML MISC USE FOR SELF ADMINISTER OF TESTOSTERONE  2CC ONCE EVERY 2 WEEKS     Melatonin 10 MG TABS Take by mouth at bedtime.     Multiple Vitamin (MULTI-VITAMIN) tablet Take 1 tablet by mouth daily.     ondansetron  (ZOFRAN ) 4 MG tablet Take 1 tablet (4 mg total) by mouth every 8 (eight) hours as needed for vomiting or nausea. 20 tablet 0   REPATHA  SURECLICK 140 MG/ML SOAJ INJECT 140 MG SUBCUTANEOUSLY ONCE EVERY 14 DAY 6 mL 0   risperiDONE  (RISPERDAL ) 1 MG tablet Take 1 tablet (1 mg total) by mouth at bedtime. 30 tablet 2   rosuvastatin  (CRESTOR ) 40 MG tablet Take 1 tablet by mouth once daily 90 tablet 2   sacubitril-valsartan (ENTRESTO ) 49-51 MG Take 1 tablet by mouth 2 (two) times daily. 180 tablet 3   Suvorexant  (BELSOMRA ) 10 MG TABS TAKE 1 TABLET BY MOUTH AT BEDTIME 30 tablet 5   Suvorexant  15 MG TABS Take 1 tablet (15 mg total) by mouth at bedtime. 30 tablet 2   tamsulosin  (FLOMAX ) 0.4 MG CAPS capsule Take 1 capsule by mouth once daily 90 capsule 3   testosterone  cypionate (DEPOTESTOSTERONE CYPIONATE) 200 MG/ML injection Inject 0.6 mLs (120 mg total) into the muscle once a week. 10 mL 0   traZODone  (DESYREL ) 150 MG tablet TAKE 2 TABLETS BY MOUTH AT BEDTIME 180 tablet 3   triamcinolone  (NASACORT ALLERGY 24HR) 55 MCG/ACT AERO nasal inhaler Place 2 sprays into the nose daily.     No current  facility-administered medications for this visit.    Allergies as of 07/19/2024 - Review Complete 07/19/2024  Allergen Reaction Noted   Ciprofloxacin Other (See Comments) 07/17/2015   Emelia sprawls officinalis] Anaphylaxis and Hives 07/17/2015   Fluocinolone Other (See Comments) 12/24/2015   Dapagliflozin  Nausea And Vomiting 08/08/2023   Prednisone  Rash 07/29/2023    Family History  Problem Relation Age of Onset   Breast cancer Mother        dx early 71s   Colon cancer Mother        dx early 43s   Other Mother        NTHL1 homozygous   Sleep apnea Mother    Parkinsonism Father    Multiple myeloma Father        dx 66s   Sleep apnea Father    Breast cancer Maternal Aunt    Stroke Maternal Grandmother    Leukemia Other        dx <20; two paternal first cousins once removed    Review of Systems:    Constitutional: No weight loss, fever, chills, weakness or fatigue HEENT: Eyes: No change in vision               Ears, Nose, Throat:  No change in hearing or congestion Skin: No rash or itching Cardiovascular: No chest pain, chest pressure or palpitations   Respiratory: No SOB or cough Gastrointestinal: See HPI and otherwise negative Genitourinary: No dysuria or change in urinary frequency Neurological: No headache, dizziness or syncope Musculoskeletal: No new muscle or joint pain Hematologic: No bleeding or bruising Psychiatric: No history of depression or anxiety    Physical Exam:  Vital signs: BP 122/78   Pulse 94   Ht 5' 9 (1.753 m)   Wt 217 lb (98.4 kg)   BMI 32.05 kg/m   Constitutional:  Pleasant male appears to be in NAD, Well developed, Well nourished, alert and cooperative Throat: Oral cavity and pharynx without inflammation, swelling or lesion.  Respiratory: Respirations even and unlabored. Lungs clear to auscultation bilaterally.   No wheezes, crackles, or rhonchi.  Cardiovascular: Normal S1, S2. Regular rate and rhythm. No peripheral edema, cyanosis or  pallor.  Gastrointestinal:  Soft, nondistended, nontender. No rebound or guarding. Normal bowel sounds. No appreciable masses or hepatomegaly. Rectal:  Not performed. Declined. Msk:  Symmetrical without gross deformities. Without edema, no deformity or joint abnormality.  Neurologic:  Alert and  oriented x4;  grossly normal neurologically.  Skin:   Dry and intact without significant lesions or rashes.  RELEVANT LABS AND IMAGING: CBC    Latest Ref Rng & Units 07/11/2024    4:14 PM 02/07/2024    1:36 PM 12/14/2023    3:03 PM  CBC  WBC 4.0 - 10.5 K/uL 9.2  13.2  9.7   Hemoglobin 13.0 - 17.0 g/dL 85.1  83.3  83.9   Hematocrit 39.0 - 52.0 % 45.1  46.3  49.2   Platelets 150.0 - 400.0 K/uL 212.0  233  210      CMP     Latest Ref Rng & Units 12/14/2023    3:03 PM 09/16/2023    7:44 AM 09/14/2023    5:59 PM  CMP  Glucose 70 - 99 mg/dL 92  840  899   BUN 6 - 20 mg/dL 16  23  20    Creatinine 0.61 - 1.24 mg/dL 9.07  8.95  8.69   Sodium 135 - 145 mmol/L 136  133  135   Potassium 3.5 - 5.1 mmol/L 4.7  4.1  5.3   Chloride 98 - 111 mmol/L 104  104  104   CO2 22 - 32 mmol/L 22  20  24    Calcium  8.9 - 10.3 mg/dL 8.3  8.9  8.6      Lab Results  Component Value Date   TSH 3.84 09/15/2022   10/2022 echo- Left ventricular ejection fraction by 3D volume is 45 %.   06/2019 [facility]  Key Findings: Moderate internal hemorrhoids with inflammation.  Benign-appearing hyperplastic-like rectal polyps, 2-3 mm in size.  No evidence of colon polyps or cancer.  No evidence of acute or chronic bleeding.   Assessment: Encounter Diagnoses  Name Primary?   Internal hemorrhoids Yes   History of colonic polyps    Rectal bleeding    Family history of colon cancer       48 year old male patient that presents with complaint of rectal bleeding that started about 2 weeks ago that has slowed down but persisted.  Last colonoscopy September 2020 at [facility] showed moderate internal hemorrhoids with  hyperplastic rectal polyps. Hgb 16.6 (April) to 14.8 (oct).  Patient on testosterone .  Family history of colon cancer in his mother.  Personal history of Hodgkin's lymphoma and thyroid  cancer in patient.  Will go ahead and proceed with colonoscopy in [facility] with Dr. Avram.  Patient on aspirin  325 mg p.o. daily for history of stroke.  I did educate the patient on hemorrhoid prevention and he requested new prescription for suppository be sent to different pharmacy will resend.  Also discussed with patient alternative therapy such as hemorrhoid banding which Dr. Enid can evaluate and provide recommendations.  Plan: - Schedule for a colonoscopy in [facility] with Dr. Avram. The risks and benefits of colonoscopy with possible polypectomy / biopsies were discussed and the patient agrees  to proceed.  - ok to take ASA 325 mg po daily   Thank you for the courtesy of this consult. Please call me with any questions or concerns.   Kerin Kren, FNP-C [facility] 07/19/2024, 2:10 PM  Cc: Thedora Garnette HERO, MD

## 2024-07-19 NOTE — Patient Instructions (Signed)
 We have sent the following medications to your pharmacy for you to pick up at your convenience: SUPREP  You have been scheduled for a colonoscopy. Please follow written instructions given to you at your visit today.   If you use inhalers (even only as needed), please bring them with you on the day of your procedure.  DO NOT TAKE 7 DAYS PRIOR TO TEST- Trulicity (dulaglutide) Ozempic, Wegovy (semaglutide) Mounjaro (tirzepatide) Bydureon Bcise (exanatide extended release)  DO NOT TAKE 1 DAY PRIOR TO YOUR TEST Rybelsus (semaglutide) Adlyxin (lixisenatide) Victoza (liraglutide) Byetta (exanatide) ___________________________________________________________________________  Due to recent changes in healthcare laws, you may see the results of your imaging and laboratory studies on MyChart before your provider has had a chance to review them.  We understand that in some cases there may be results that are confusing or concerning to you. Not all laboratory results come back in the same time frame and the provider may be waiting for multiple results in order to interpret others.  Please give us  48 hours in order for your provider to thoroughly review all the results before contacting the office for clarification of your results.   _______________________________________________________  If your blood pressure at your visit was 140/90 or greater, please contact your primary care physician to follow up on this.  _______________________________________________________  If you are age 46 or older, your body mass index should be between 23-30. Your Body mass index is 32.05 kg/m. If this is out of the aforementioned range listed, please consider follow up with your Primary Care Provider.  If you are age 46 or younger, your body mass index should be between 19-25. Your Body mass index is 32.05 kg/m. If this is out of the aformentioned range listed, please consider follow up with your Primary Care  Provider.   ________________________________________________________  The [facility] providers would like to encourage you to use MYCHART to communicate with providers for non-urgent requests or questions.  Due to long hold times on the telephone, sending your provider a message by [facility] may be a faster and more efficient way to get a response.  Please allow 48 business hours for a response.  Please remember that this is for non-urgent requests.  _______________________________________________________  [facility] is using a team-based approach to care.  Your team is made up of your doctor and two to three APPS. Our APPS (Nurse Practitioners and Physician Assistants) work with your physician to ensure care continuity for you. They are fully qualified to address your health concerns and develop a treatment plan. They communicate directly with your gastroenterologist to care for you. Seeing the Advanced Practice Practitioners on your physician's team can help you by facilitating care more promptly, often allowing for earlier appointments, access to diagnostic testing, procedures, and other specialty referrals.   Thank you for trusting me with your gastrointestinal care. Deanna May, FNP-C

## 2024-07-20 ENCOUNTER — Other Ambulatory Visit (HOSPITAL_BASED_OUTPATIENT_CLINIC_OR_DEPARTMENT_OTHER): Payer: Self-pay

## 2024-07-21 ENCOUNTER — Other Ambulatory Visit: Payer: Self-pay | Admitting: Allergy & Immunology

## 2024-07-22 NOTE — Progress Notes (Unsigned)
 [facility] Counselor/Therapist Progress Note  Patient ID: JAHZION BROGDEN, MRN: [id number]    Date: 07/22/24  Time Spent: ***  {LBBHAMPM:26719} - *** {LBBHAMPM:26719} : *** Minutes  Treatment Type: Individual Therapy.  Reported Symptoms: ***  Mental Status Exam: Appearance:  {PSY:22683}     Behavior: {PSY:21022743}  Motor: {PSY:22302}  Speech/Language:  {PSY:22685}  Affect: {PSY:22687}  Mood: {PSY:31886}  Thought process: {PSY:31888}  Thought content:   {PSY:239-556-0885}  Sensory/Perceptual disturbances:   {PSY:901-723-8734}  Orientation: {PSY:30297}  Attention: {PSY:22877}  Concentration: {PSY:727-216-8954}  Memory: {[phone]}  Fund of knowledge:  {PSY:727-216-8954}  Insight:   {PSY:727-216-8954}  Judgment:  {PSY:727-216-8954}  Impulse Control: {PSY:727-216-8954}   Risk Assessment: Danger to Self:  {PSY:22692} Self-injurious Behavior: {PSY:22692} Danger to Others: {PSY:22692} Duty to Warn:{PSY:311194} Physical Aggression / Violence:{PSY:21197} Access to Firearms a concern: {PSY:21197} Gang Involvement:{PSY:21197}  Subjective:   [name] participated from {Patient Location:[address]::home}, via {LBBHVIDEOORPHONE:26720}, and consented to treatment. Therapist participated from {LBBHPROVIDERLOCATION:26721}. We met online due to COVID pandemic.   ***   Interventions: {[phone]}  Diagnosis: No diagnosis found.   Plan: ***Patient is to use CBT, mindfulness and coping skills to help manage decrease symptoms associated with their diagnosis.   Long-term goal:   ***Reduce overall level, frequency, and intensity of the feelings of depression, anxiety and panic evidenced by       decreased irritability, negative self talk, and helpless feelings from 6 to 7 days/week to 0 to 1 days/week per client report for at least 3 consecutive months.  Short-term goal:  ***Verbally express understanding of the relationship between feelings of depression, anxiety and  their impact on thinking patterns and behaviors. Verbalize an understanding of the role that distorted thinking plays in creating fears, excessive worry, and ruminations.  [name] MSW, LCSW/DATE

## 2024-07-25 ENCOUNTER — Encounter: Payer: Self-pay | Admitting: Internal Medicine

## 2024-07-25 ENCOUNTER — Ambulatory Visit: Admitting: Internal Medicine

## 2024-07-25 VITALS — BP 143/76 | HR 81 | Temp 100.0°F | Resp 17 | Ht 69.0 in | Wt 217.0 lb

## 2024-07-25 DIAGNOSIS — D125 Benign neoplasm of sigmoid colon: Secondary | ICD-10-CM | POA: Diagnosis not present

## 2024-07-25 DIAGNOSIS — K625 Hemorrhage of anus and rectum: Secondary | ICD-10-CM

## 2024-07-25 DIAGNOSIS — Z800 Family history of malignant neoplasm of digestive organs: Secondary | ICD-10-CM

## 2024-07-25 DIAGNOSIS — Z1211 Encounter for screening for malignant neoplasm of colon: Secondary | ICD-10-CM

## 2024-07-25 DIAGNOSIS — K635 Polyp of colon: Secondary | ICD-10-CM

## 2024-07-25 DIAGNOSIS — Z860101 Personal history of adenomatous and serrated colon polyps: Secondary | ICD-10-CM | POA: Diagnosis not present

## 2024-07-25 DIAGNOSIS — D120 Benign neoplasm of cecum: Secondary | ICD-10-CM | POA: Diagnosis not present

## 2024-07-25 DIAGNOSIS — D123 Benign neoplasm of transverse colon: Secondary | ICD-10-CM | POA: Diagnosis not present

## 2024-07-25 DIAGNOSIS — K5140 Inflammatory polyps of colon without complications: Secondary | ICD-10-CM

## 2024-07-25 DIAGNOSIS — Z860100 Personal history of colon polyps, unspecified: Secondary | ICD-10-CM

## 2024-07-25 MED ORDER — SODIUM CHLORIDE 0.9 % IV SOLN: 500.0000 mL | Freq: Once | INTRAVENOUS | Status: DC

## 2024-07-25 NOTE — Progress Notes (Signed)
 A/o x 3, VSS, good SR's, pleased with anesthesia, report to RN

## 2024-07-25 NOTE — Progress Notes (Signed)
 Called to room to assist during endoscopic procedure.  Patient ID and intended procedure confirmed with present staff. Received instructions for my participation in the procedure from the performing physician.

## 2024-07-25 NOTE — Progress Notes (Signed)
 History and Physical Interval Note:  07/25/2024 11:34 AM  Troy [name]  has presented today for endoscopic procedure(s), with the diagnosis of  Encounter Diagnoses  Name Primary?   History of colonic polyps Yes   Rectal bleeding   .  The various methods of evaluation and treatment have been discussed with the patient and/or family. After consideration of risks, benefits and other options for treatment, the patient has consented to  the endoscopic procedure(s).   The patient's history has been reviewed, patient examined, no change in status, stable for endoscopic procedure(s).  I have reviewed the patient's chart and labs.  Questions were answered to the patient's satisfaction.     Troy CHARLENA Commander, MD, NOLIA

## 2024-07-25 NOTE — Op Note (Signed)
 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 The patient's tolerance of                            previous anesthesia was also reviewed. The risks                            and benefits of the procedure and the sedation                            options and risks were discussed with the patient.                            All questions were answered, and informed consent                            was obtained. Prior Anticoagulants: The patient has                            taken no anticoagulant or antiplatelet agents. ASA                            Grade Assessment: III - A patient with severe                            systemic disease. After reviewing the risks and                            benefits, the patient was deemed in satisfactory  condition to undergo the procedure.                           After obtaining informed consent, the colonoscope                            was passed under direct vision. Throughout the                            procedure, the patient's blood pressure, pulse, and                            oxygen saturations were monitored continuously. The                            CF [id number]  #[id number] was introduced through the anus                            and advanced to the the cecum, identified by                            appendiceal orifice and ileocecal valve. The                            colonoscopy was somewhat difficult due to colon                            movement from retching. The patient tolerated the                            procedure well. The quality of the bowel                            preparation was good. The ileocecal valve,                            appendiceal orifice, and rectum were photographed.                            The bowel preparation used was SUPREP via split                            dose instruction. Scope In: 11:40:10 AM Scope Out: 12:03:59 PM Scope Withdrawal Time: 0 hours 14 minutes 50 seconds  Total Procedure Duration: 0 hours 23 minutes 49 seconds  Findings:                 The perianal and digital rectal examinations were                            normal.                           Six sessile polyps were found in the sigmoid colon,  descending colon, transverse colon, cecum and                            ileocecal valve. The polyps were 3 to 10 mm in                            size. These polyps were removed with a cold snare.                            Resection and retrieval were complete. Verification                            of patient identification for the specimen was                            done. Estimated blood loss was minimal.                           Internal hemorrhoids were found.                           The exam was otherwise without abnormality on                            direct and retroflexion views except for a few                            sigmoid divertiucula. Complications:            No immediate complications. Estimated Blood Loss:     Estimated blood loss was minimal. Impression:               - Six 3 to 10 mm polyps in the sigmoid colon, in                             the descending colon, in the transverse colon, in                            the cecum and at the ileocecal valve, removed with                            a cold snare. Resected and retrieved.                           - Internal hemorrhoids.                           - Few sigmoid diverticula                           - The examination was otherwise normal on direct                            and retroflexion views.                           -  Personal history of colonic polyps. 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 The signs and symptoms of potential                            delayed complications were discussed with the                            patient. Return to normal activities tomorrow.                            Written discharge instructions were provided to the                            patient.                           - Resume previous diet.                           - Continue present medications.                           - Await pathology results.                           - Repeat colonoscopy is recommended for                            surveillance. The colonoscopy date will be                            determined after pathology results from today's                            exam become available for review.                           - Will offer banding appointment if he desires. (Is                            a weight lifter and that will aggravate hemorrhoids) Lupita FORBES Commander, MD 07/25/2024 12:21:33 PM This report has been signed electronically.

## 2024-07-25 NOTE — Patient Instructions (Addendum)
 I removed 6 polyps today.  They all look benign but they do look precancerous.  They were not the cause of bleeding I think that was from hemorrhoids.  Power lifting can aggravate hemorrhoids.  We will give you a handout about a procedure that is possible, it is called hemorrhoid banding.  This is done in the office.  We can schedule an appointment for that if you wish.  I will let you know pathology results and when to have another routine colonoscopy by mail and/or My Chart.  You had some vomiting during the procedure which we think we suctioned.  If you get a fever or breathing difficulties please contact us  promptly as you may need antibiotics.  I appreciate the opportunity to care for you.  Lupita CHARLENA Commander, MD, [facility]    Handouts given: Polyps, Hemorrhoids, Diverticulosis Resume previous diet. Continue present medications.  Await pathology results. Repeat colonoscopy is recommended for surveillance based on pathology results. Will offer banding appointment if desired.   YOU HAD AN ENDOSCOPIC PROCEDURE TODAY AT THE [facility]:   Refer to the procedure report that was given to you for any specific questions about what was found during the examination.  If the procedure report does not answer your questions, please call your gastroenterologist to clarify.  If you requested that your care partner not be given the details of your procedure findings, then the procedure report has been included in a sealed envelope for you to review at your convenience later.  YOU SHOULD EXPECT: Some feelings of bloating in the abdomen. Passage of more gas than usual.  Walking can help get rid of the air that was put into your GI tract during the procedure and reduce the bloating. If you had a lower endoscopy (such as a colonoscopy or flexible sigmoidoscopy) you may notice spotting of blood in your stool or on the toilet paper. If you underwent a bowel prep for your procedure, you may not have a  normal bowel movement for a few days.  Please Note:  You might notice some irritation and congestion in your nose or some drainage.  This is from the oxygen used during your procedure.  There is no need for concern and it should clear up in a day or so.  SYMPTOMS TO REPORT IMMEDIATELY:  Following lower endoscopy (colonoscopy or flexible sigmoidoscopy):  Excessive amounts of blood in the stool  Significant tenderness or worsening of abdominal pains  Swelling of the abdomen that is new, acute  Fever of 100F or higher   For urgent or emergent issues, a gastroenterologist can be reached at any hour by calling [phone]. Do not use MyChart messaging for urgent concerns.    DIET:  We do recommend a small meal at first, but then you may proceed to your regular diet.  Drink plenty of fluids but you should avoid alcoholic beverages for 24 hours.  ACTIVITY:  You should plan to take it easy for the rest of today and you should NOT DRIVE or use heavy machinery until tomorrow (because of the sedation medicines used during the test).    FOLLOW UP: Our staff will call the number listed on your records the next business day following your procedure.  We will call around 7:15- 8:00 am to check on you and address any questions or concerns that you may have regarding the information given to you following your procedure. If we do not reach you, we will leave a message.  If any biopsies were taken you will be contacted by phone or by letter within the next 1-3 weeks.  Please call us  at [phone] if you have not heard about the biopsies in 3 weeks.    SIGNATURES/CONFIDENTIALITY: You and/or your care partner have signed paperwork which will be entered into your electronic medical record.  These signatures attest to the fact that that the information above on your After Visit Summary has been reviewed and is understood.  Full responsibility of the confidentiality of this discharge information lies  with you and/or your care-partner.

## 2024-07-26 ENCOUNTER — Ambulatory Visit: Payer: Self-pay | Admitting: *Deleted

## 2024-07-26 ENCOUNTER — Other Ambulatory Visit

## 2024-07-26 ENCOUNTER — Telehealth: Payer: Self-pay | Admitting: *Deleted

## 2024-07-26 ENCOUNTER — Encounter: Payer: Self-pay | Admitting: Family Medicine

## 2024-07-26 DIAGNOSIS — G4733 Obstructive sleep apnea (adult) (pediatric): Secondary | ICD-10-CM | POA: Diagnosis not present

## 2024-07-26 DIAGNOSIS — J8410 Pulmonary fibrosis, unspecified: Secondary | ICD-10-CM

## 2024-07-26 DIAGNOSIS — R052 Subacute cough: Secondary | ICD-10-CM

## 2024-07-26 NOTE — Telephone Encounter (Signed)
 FYI Only or Action Required?: Action required by provider: request for appointment and update on patient condition.  Patient was last seen in primary care on 07/11/2024 by Thedora Garnette HERO, MD.  Called Nurse Triage reporting Cough.  Symptoms began several days ago.  Interventions attempted: Prescription medications: inhalers , nebulizers  and Rest, hydration, or home remedies.  Symptoms are: gradually worsening.  Triage Disposition: See HCP Within 4 Hours (Or PCP Triage)  Patient/caregiver understands and will follow disposition?: No, wishes to speak with PCP          Patient refused UC. Requesting call back ASAP.   Copied from CRM [id number]. Topic: Clinical - Red Word Triage >> Jul 26, 2024  2:37 PM Alfonso HERO wrote: Red Word that prompted transfer to Nurse Triage: patient has a bad chest cold and had a fever yesterday and can barely breathe wanting antibiotics. Reason for Disposition  [1] MILD difficulty breathing (e.g., minimal/no SOB at rest, SOB with walking, pulse < 100) AND [2] still present when not coughing  Answer Assessment - Initial Assessment Questions Recommended UC and patient declined. No available appt that patient could make and not possibly be more than 10 minutes late for today. No other clinics noted with appt today. Patient has contacted allergy provider office and was instructed to call PCP . Patient very upset with not getting appt . Reports he knows what is wrong a chest cold and if he would have gotten antibiotics when he asked for them this would not be an issue. Patient requesting call back with first available appt.  CAL aware patient refusal to UC    1. ONSET: When did the cough begin?      Prior to 07/21/24 2. SEVERITY: How bad is the cough today?      Worsening sx  3. SPUTUM: Describe the color of your sputum (e.g., none, dry cough; clear, white, yellow, green)     Dark in color 4. HEMOPTYSIS: Are you coughing up any blood? If Yes, ask: How  much? (e.g., flecks, streaks, tablespoons, etc.)     na 5. DIFFICULTY BREATHING: Are you having difficulty breathing? If Yes, ask: How bad is it? (e.g., mild, moderate, severe)      Reports can hardly breathe denies difficulty breathing and no SOB at this time. Hx asthma  6. FEVER: Do you have a fever? If Yes, ask: What is your temperature, how was it measured, and when did it start?     Yes  7. CARDIAC HISTORY: Do you have any history of heart disease? (e.g., heart attack, congestive heart failure)      na 8. LUNG HISTORY: Do you have any history of lung disease?  (e.g., pulmonary embolus, asthma, emphysema)     Hx asthma 9. PE RISK FACTORS: Do you have a history of blood clots? (or: recent major surgery, recent prolonged travel, bedridden)     na 10. OTHER SYMPTOMS: Do you have any other symptoms? (e.g., runny nose, wheezing, chest pain)       Cough productive dark in color. Reports chest cold. Chest congestion. Breathing issues chronic and reports his breathing issues are not severe like everyone else 11. PREGNANCY: Is there any chance you are pregnant? When was your last menstrual period?       na 12. TRAVEL: Have you traveled out of the country in the last month? (e.g., travel history, exposures)       na  Protocols used: Cough - Acute Productive-A-AH

## 2024-07-26 NOTE — Telephone Encounter (Signed)
  Follow up Call-     07/25/2024   10:58 AM  Call back number  Post procedure Call Back phone  # [phone]  Permission to leave phone message Yes     Patient questions:  Message left to call if necessary.

## 2024-07-27 LAB — SURGICAL PATHOLOGY

## 2024-07-27 MED ORDER — AMOXICILLIN-POT CLAVULANATE 875-125 MG PO TABS: 1.0000 | ORAL_TABLET | Freq: Two times a day (BID) | ORAL | 0 refills | Status: DC

## 2024-07-27 NOTE — Telephone Encounter (Signed)
 LOV  07/11/24 Please review and advise.  Thanks. Dm/cma

## 2024-08-01 ENCOUNTER — Ambulatory Visit: Payer: Self-pay | Admitting: Internal Medicine

## 2024-08-01 ENCOUNTER — Encounter: Payer: Self-pay | Admitting: Internal Medicine

## 2024-08-01 ENCOUNTER — Ambulatory Visit: Admitting: Licensed Clinical Social Worker

## 2024-08-01 ENCOUNTER — Ambulatory Visit (INDEPENDENT_AMBULATORY_CARE_PROVIDER_SITE_OTHER): Admitting: Licensed Clinical Social Worker

## 2024-08-01 DIAGNOSIS — F603 Borderline personality disorder: Secondary | ICD-10-CM | POA: Diagnosis not present

## 2024-08-01 DIAGNOSIS — F3181 Bipolar II disorder: Secondary | ICD-10-CM

## 2024-08-01 DIAGNOSIS — K625 Hemorrhage of anus and rectum: Secondary | ICD-10-CM

## 2024-08-01 DIAGNOSIS — F4310 Post-traumatic stress disorder, unspecified: Secondary | ICD-10-CM | POA: Diagnosis not present

## 2024-08-03 ENCOUNTER — Ambulatory Visit: Payer: Self-pay | Admitting: Internal Medicine

## 2024-08-03 ENCOUNTER — Encounter: Payer: Self-pay | Admitting: Internal Medicine

## 2024-08-03 ENCOUNTER — Other Ambulatory Visit (INDEPENDENT_AMBULATORY_CARE_PROVIDER_SITE_OTHER)

## 2024-08-03 DIAGNOSIS — K625 Hemorrhage of anus and rectum: Secondary | ICD-10-CM | POA: Diagnosis not present

## 2024-08-03 LAB — CBC WITH DIFFERENTIAL/PLATELET
Basophils Absolute: 0.1 K/uL (ref 0.0–0.1)
Basophils Relative: 0.9 % (ref 0.0–3.0)
Eosinophils Absolute: 0.5 K/uL (ref 0.0–0.7)
Eosinophils Relative: 4.8 % (ref 0.0–5.0)
HCT: 42.3 % (ref 39.0–52.0)
Hemoglobin: 13.9 g/dL (ref 13.0–17.0)
Lymphocytes Relative: 24.9 % (ref 12.0–46.0)
Lymphs Abs: 2.7 K/uL (ref 0.7–4.0)
MCHC: 33 g/dL (ref 30.0–36.0)
MCV: 100.9 fl — ABNORMAL HIGH (ref 78.0–100.0)
Monocytes Absolute: 1.5 K/uL — ABNORMAL HIGH (ref 0.1–1.0)
Monocytes Relative: 13.8 % — ABNORMAL HIGH (ref 3.0–12.0)
Neutro Abs: 6 K/uL (ref 1.4–7.7)
Neutrophils Relative %: 55.6 % (ref 43.0–77.0)
Platelets: 285 K/uL (ref 150.0–400.0)
RBC: 4.19 Mil/uL — ABNORMAL LOW (ref 4.22–5.81)
RDW: 15.6 % — ABNORMAL HIGH (ref 11.5–15.5)
WBC: 10.7 K/uL — ABNORMAL HIGH (ref 4.0–10.5)

## 2024-08-03 NOTE — Progress Notes (Signed)
 [facility] Counselor/Therapist Progress Note  Patient ID: Troy [name], MRN: [id number]    Date: 08/01/2024  Time Spent: 0304  pm - 0400 pm : 56 Minutes  Treatment Type: Individual Therapy.  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 Patient reports both parents are ill.    Mental Status Exam: Appearance:  Casual     Behavior: Appropriate  Motor: Normal  Speech/Language:  Clear and Coherent  Affect: Appropriate  Mood: normal  Thought process: normal  Thought content:   WNL  Sensory/Perceptual disturbances:   WNL  Orientation: oriented to person, place, time/date, situation, day of week, month of year, and year  Attention: Good  Concentration: Good  Memory: WNL  Fund of knowledge:  Good  Insight:   Good  Judgment:  Good  Impulse Control: Good    Risk Assessment: Danger to Self:  No Self-injurious Behavior: No Danger to Others: No Duty to Warn:no Physical Aggression / Violence:No  Access to Firearms a concern: No  Gang Involvement:No    Subjective:    Troy [name] participated in person from office, located at [employer]. Troy [name] consented to treatment. Therapist participated from office located at [facility].   Troy [name] presented for his session with a positive attitude. He reports that he has been tested for colon cancer and that the doctor sent the specimens off for further evaluation. He is concerned due to his mother previously having colon cancer and his previous battles with multiple cancers since childhood. Although Troy [name] reported being fearful he had a positive attitude and reported that he has been through this so many times that it almost seems normal. Patient continued to voice his concerns over the division of his family and how  his siblings won't speak to him. He reports that with the holidays coming it is more on his mind than ever. He reports that his relationship with his mother has declined since the election. He reports that she remains engulfed in [facility] and knowing he has different views it's uncomfortable for then to be together. Patient reports multiple attempts to work things out with his siblings and feels that they are unwilling to see him in any other light than the addict he was before.  Clinician actively listened and provided support to patient. Clinician processed with patient his concerns and provided support and validated his feelings. We discussed problem solving techniques with his family, which he reports have all been attempted in the past. Clinician and patient discussed patient focusing on what he can control and utilize the following skills: Focus on your own healing Forgive yourself: Acknowledge the hurt you've endured and give yourself grace for your reactions and for navigating a difficult situation.  Let go of the need for their forgiveness: Recognize that forgiveness is a personal process that cannot be forced or rushed, and your healing doesn't have to be dependent on their acceptance.  Process your emotions: Allow yourself to feel and normalize your emotions about the situation. You can work on releasing negative feelings without being directly involved with your family, which may involve seeking professional help.  Set healthy boundaries Adjust your expectations: Accept that the relationship may never be what you hoped for. Mourn the loss of that fantasy of a perfect relationship and focus on the  reality of the present.  Limit contact: If necessary, reduce contact with family members who refuse to forgive or acknowledge the harm they've caused.  Change your responses: Instead of matching their energy, find ways to respond calmly, such as by remaining quiet, leaving the room, or shifting the  conversation to a different topic.  Build other support systems Find support elsewhere: Lean on other family members who are willing to offer support or build connections with friends who can provide a sense of family.  Seek professional help: Consider speaking with a therapist or counselor who can provide a safe space to discuss your feelings and work through the conflict.  Consider the future Live your best life: Focus your energy on your own healing, personal growth, and building a happy life, regardless of whether the relationship is repaired.  Be patient (if you choose): If you wish to keep the door open for potential future reconciliation, be patient and continue to love them, while also accepting the current consequences of their actions.   Troy [name] was engaged in discussion and was polite and cooperative. He was motivated for treatment as evidenced by his transparency and eagerness to utilize coping skills and improve his symptoms. Patient is to use CBT, mindfulness and coping skills to help manage decrease symptoms associated with their diagnosis. Reduce overall level, frequency, and intensity of the feelings of depression, anxiety and panic evidenced by decreased irritability, negative self talk, and helpless feelings from 6 to 7 days/week to 0 to 1 days/week per client report for at least 3 consecutive months. Treatment planning to be reviewed by 05/02/2025.   Interventions: Cognitive Behavioral Therapy, Dialectical Behavioral Therapy, Assertiveness/Communication, Mindfulness Meditation, Motivational Interviewing, and Solution-Oriented/Positive Psychology   Diagnosis: PTSD, bipolar II disorder, Borderline Personality Disorder        [name] MSW, LCSW DATE: 08/01/2024

## 2024-08-03 NOTE — Progress Notes (Deleted)
 [facility] Counselor/Therapist Progress Note  Patient ID: BRODIE CORRELL, MRN: [id number]    Date: 08/03/24  Time Spent: ***  {LBBHAMPM:26719} - *** {LBBHAMPM:26719} : *** Minutes  Treatment Type: Individual Therapy.  Reported Symptoms: ***  Mental Status Exam: Appearance:  {PSY:22683}     Behavior: {PSY:21022743}  Motor: {PSY:22302}  Speech/Language:  {PSY:22685}  Affect: {PSY:22687}  Mood: {PSY:31886}  Thought process: {PSY:31888}  Thought content:   {PSY:930-456-4822}  Sensory/Perceptual disturbances:   {PSY:228-735-2730}  Orientation: {PSY:30297}  Attention: {PSY:22877}  Concentration: {PSY:720-445-3523}  Memory: {[phone]}  Fund of knowledge:  {PSY:720-445-3523}  Insight:   {PSY:720-445-3523}  Judgment:  {PSY:720-445-3523}  Impulse Control: {PSY:720-445-3523}   Risk Assessment: Danger to Self:  {PSY:22692} Self-injurious Behavior: {PSY:22692} Danger to Others: {PSY:22692} Duty to Warn:{PSY:311194} Physical Aggression / Violence:{PSY:21197} Access to Firearms a concern: {PSY:21197} Gang Involvement:{PSY:21197}  Subjective:   [name] participated from {Patient Location:[address]::home}, via {LBBHVIDEOORPHONE:26720}, and consented to treatment. Therapist participated from {LBBHPROVIDERLOCATION:26721}. We met online due to COVID pandemic.   ***   Interventions: {PSY:[phone]}  Diagnosis: No diagnosis found.   Plan: ***Patient is to use CBT, mindfulness and coping skills to help manage decrease symptoms associated with their diagnosis.   Long-term goal:   ***Reduce overall level, frequency, and intensity of the feelings of depression, anxiety and panic evidenced by       decreased irritability, negative self talk, and helpless feelings from 6 to 7 days/week to 0 to 1 days/week per client report for at least 3 consecutive months.  Short-term goal:  ***Verbally express understanding of the relationship between feelings of depression, anxiety and  their impact on thinking patterns and behaviors. Verbalize an understanding of the role that distorted thinking plays in creating fears, excessive worry, and ruminations.  [name] MSW, LCSW/DATE

## 2024-08-06 DIAGNOSIS — G4733 Obstructive sleep apnea (adult) (pediatric): Secondary | ICD-10-CM | POA: Diagnosis not present

## 2024-08-07 ENCOUNTER — Other Ambulatory Visit: Payer: Self-pay

## 2024-08-07 DIAGNOSIS — D751 Secondary polycythemia: Secondary | ICD-10-CM

## 2024-08-08 ENCOUNTER — Inpatient Hospital Stay

## 2024-08-08 ENCOUNTER — Inpatient Hospital Stay: Attending: Hematology

## 2024-08-08 DIAGNOSIS — D500 Iron deficiency anemia secondary to blood loss (chronic): Secondary | ICD-10-CM | POA: Diagnosis not present

## 2024-08-08 DIAGNOSIS — D751 Secondary polycythemia: Secondary | ICD-10-CM | POA: Diagnosis present

## 2024-08-08 DIAGNOSIS — Z79890 Hormone replacement therapy (postmenopausal): Secondary | ICD-10-CM | POA: Insufficient documentation

## 2024-08-08 LAB — CBC WITH DIFFERENTIAL (CANCER CENTER ONLY)
Abs Immature Granulocytes: 0.07 K/uL (ref 0.00–0.07)
Basophils Absolute: 0.1 K/uL (ref 0.0–0.1)
Basophils Relative: 1 %
Eosinophils Absolute: 0.6 K/uL — ABNORMAL HIGH (ref 0.0–0.5)
Eosinophils Relative: 5 %
HCT: 36.2 % — ABNORMAL LOW (ref 39.0–52.0)
Hemoglobin: 12.5 g/dL — ABNORMAL LOW (ref 13.0–17.0)
Immature Granulocytes: 1 %
Lymphocytes Relative: 24 %
Lymphs Abs: 2.7 K/uL (ref 0.7–4.0)
MCH: 32.6 pg (ref 26.0–34.0)
MCHC: 34.5 g/dL (ref 30.0–36.0)
MCV: 94.5 fL (ref 80.0–100.0)
Monocytes Absolute: 1.3 K/uL — ABNORMAL HIGH (ref 0.1–1.0)
Monocytes Relative: 11 %
Neutro Abs: 6.6 K/uL (ref 1.7–7.7)
Neutrophils Relative %: 58 %
Platelet Count: 336 K/uL (ref 150–400)
RBC: 3.83 MIL/uL — ABNORMAL LOW (ref 4.22–5.81)
RDW: 17.1 % — ABNORMAL HIGH (ref 11.5–15.5)
WBC Count: 11.3 K/uL — ABNORMAL HIGH (ref 4.0–10.5)
nRBC: 0 % (ref 0.0–0.2)

## 2024-08-08 LAB — FERRITIN: Ferritin: 27 ng/mL (ref 24–336)

## 2024-08-08 NOTE — Progress Notes (Signed)
 Pt presents for Phlebotomy. Parameters for phlebotomy if Hct >50%. Hct 36.2, Hgb 12.5 today. Pt reports rectal bleeding with apt on 10/31 at [facility]. Pt verbalized understanding of not needed phlebotomy and to call with questions/concerns. Reviewed ED precautions. Pt ambulated independently to lobby for d/c. Given copy of labs.

## 2024-08-09 ENCOUNTER — Encounter: Payer: Self-pay | Admitting: Family Medicine

## 2024-08-09 NOTE — Telephone Encounter (Signed)
 Called and spoke to patient, he is scheduled for a procedure tomorrow.  I did advise that the best course of action would be to contact them and ask about taking some iron supplements. Dm/cma

## 2024-08-10 ENCOUNTER — Encounter: Payer: Self-pay | Admitting: Internal Medicine

## 2024-08-10 ENCOUNTER — Ambulatory Visit: Admitting: Internal Medicine

## 2024-08-10 ENCOUNTER — Encounter: Admitting: Internal Medicine

## 2024-08-10 VITALS — BP 100/60 | HR 96 | Ht 69.0 in | Wt 216.0 lb

## 2024-08-10 DIAGNOSIS — K641 Second degree hemorrhoids: Secondary | ICD-10-CM | POA: Diagnosis not present

## 2024-08-10 NOTE — Progress Notes (Signed)
   HEMORRHOID LIGATION  Indications: Rectal bleeding, anemia ferritin 27.  Recent colonoscopy negative for other source of bleeding.  Hemorrhoids also exhibit grade 23 prolapse.  Rectal: Normal anoderm digital exam with palpable internal hemorrhoids  Anoscopy grade 2 internal hemorrhoid complexes with most prominent in right posterior position  PROCEDURE NOTE: The patient presents with symptomatic grade 2  hemorrhoids, requesting rubber band ligation of his/her hemorrhoidal disease.  All risks, benefits and alternative forms of therapy were described and informed consent was obtained.   The anorectum was pre-medicated with 5% lidocaine  The decision was made to band the right posterior internal hemorrhoid, and the [facility] O'Regan System was used to perform band ligation without complication.  Digital anorectal examination was then performed to assure proper positioning of the band, and to adjust the banded tissue as required.  The patient was discharged home without pain or other issues.  Dietary and behavioral recommendations were given and along with follow-up instructions.     The following adjunctive treatments were recommended:  Benefiber 1 tablespoon daily  The patient will return in about 2 weeks for  follow-up and possible additional banding as required.  Banding 1 column at a time, he is to refrain from weight lifting for 3 or 4 days after banding.  Also recommended patient start taking iron supplement.  He has that at home. No complications were encountered and the patient tolerated the procedure well.

## 2024-08-10 NOTE — Patient Instructions (Signed)
 HEMORRHOID BANDING PROCEDURE    FOLLOW-UP CARE   The procedure you have had should have been relatively painless since the banding of the area involved does not have nerve endings and there is no pain sensation.  The rubber band cuts off the blood supply to the hemorrhoid and the band may fall off as soon as 48 hours after the banding (the band may occasionally be seen in the toilet bowl following a bowel movement). You may notice a temporary feeling of fullness in the rectum which should respond adequately to plain Tylenol  or Motrin.  Following the banding, avoid strenuous exercise that evening and resume full activity the next day.  A sitz bath (soaking in a warm tub) or bidet is soothing, and can be useful for cleansing the area after bowel movements.     To avoid constipation, take two tablespoons of natural wheat bran, natural oat bran, flax, Benefiber or any over the counter fiber supplement and increase your water intake to 7-8 glasses daily.  Take one tablespoon of benefiber daily.   Unless you have been prescribed anorectal medication, do not put anything inside your rectum for two weeks: No suppositories, enemas, fingers, etc.  Occasionally, you may have more bleeding than usual after the banding procedure.  This is often from the untreated hemorrhoids rather than the treated one.  Don't be concerned if there is a tablespoon or so of blood.  If there is more blood than this, lie flat with your bottom higher than your head and apply an ice pack to the area. If the bleeding does not stop within a half an hour or if you feel faint, call our office at [phone] or go to the emergency room.  Problems are not common; however, if there is a substantial amount of bleeding, severe pain, chills, fever or difficulty passing urine (very rare) or other problems, you should call us  at [phone] or report to the nearest emergency room.  Do not stay seated continuously for more than 2-3  hours for a day or two after the procedure.  Tighten your buttock muscles 10-15 times every two hours and take 10-15 deep breaths every 1-2 hours.  Do not spend more than a few minutes on the toilet if you cannot empty your bowel; instead re-visit the toilet at a later time.  Get back on your iron.  I appreciate the opportunity to care for you. Lupita Commander, MD, [facility]

## 2024-08-17 ENCOUNTER — Other Ambulatory Visit: Payer: Self-pay | Admitting: Allergy & Immunology

## 2024-08-17 DIAGNOSIS — J4550 Severe persistent asthma, uncomplicated: Secondary | ICD-10-CM

## 2024-08-23 ENCOUNTER — Encounter: Payer: Self-pay | Admitting: Internal Medicine

## 2024-08-23 ENCOUNTER — Encounter: Payer: Self-pay | Admitting: Adult Health

## 2024-08-23 ENCOUNTER — Telehealth: Admitting: Adult Health

## 2024-08-23 ENCOUNTER — Other Ambulatory Visit

## 2024-08-23 ENCOUNTER — Ambulatory Visit: Admitting: Internal Medicine

## 2024-08-23 VITALS — BP 116/70 | HR 85 | Ht 72.0 in | Wt 218.0 lb

## 2024-08-23 DIAGNOSIS — D649 Anemia, unspecified: Secondary | ICD-10-CM | POA: Diagnosis not present

## 2024-08-23 DIAGNOSIS — F4310 Post-traumatic stress disorder, unspecified: Secondary | ICD-10-CM | POA: Diagnosis not present

## 2024-08-23 DIAGNOSIS — R42 Dizziness and giddiness: Secondary | ICD-10-CM

## 2024-08-23 DIAGNOSIS — K625 Hemorrhage of anus and rectum: Secondary | ICD-10-CM

## 2024-08-23 DIAGNOSIS — K641 Second degree hemorrhoids: Secondary | ICD-10-CM | POA: Diagnosis not present

## 2024-08-23 DIAGNOSIS — D751 Secondary polycythemia: Secondary | ICD-10-CM

## 2024-08-23 DIAGNOSIS — F602 Antisocial personality disorder: Secondary | ICD-10-CM

## 2024-08-23 DIAGNOSIS — F6081 Narcissistic personality disorder: Secondary | ICD-10-CM

## 2024-08-23 DIAGNOSIS — F3181 Bipolar II disorder: Secondary | ICD-10-CM | POA: Diagnosis not present

## 2024-08-23 DIAGNOSIS — F603 Borderline personality disorder: Secondary | ICD-10-CM

## 2024-08-23 LAB — CBC
HCT: 39.4 % (ref 39.0–52.0)
Hemoglobin: 13 g/dL (ref 13.0–17.0)
MCHC: 33 g/dL (ref 30.0–36.0)
MCV: 99.4 fl (ref 78.0–100.0)
Platelets: 355 K/uL (ref 150.0–400.0)
RBC: 3.97 Mil/uL — ABNORMAL LOW (ref 4.22–5.81)
RDW: 16.9 % — ABNORMAL HIGH (ref 11.5–15.5)
WBC: 8.7 K/uL (ref 4.0–10.5)

## 2024-08-23 MED ORDER — RISPERIDONE 1 MG PO TABS: 1.0000 mg | ORAL_TABLET | Freq: Every day | ORAL | 2 refills | Status: AC

## 2024-08-23 NOTE — Patient Instructions (Signed)
 Your provider has requested that you go to the basement level for lab work before leaving today. Press "B" on the elevator. The lab is located at the first door on the left as you exit the elevator.  Due to recent changes in healthcare laws, you may see the results of your imaging and laboratory studies on MyChart before your provider has had a chance to review them.  We understand that in some cases there may be results that are confusing or concerning to you. Not all laboratory results come back in the same time frame and the provider may be waiting for multiple results in order to interpret others.  Please give [place] 48 hours in order for your provider to thoroughly review all the results before contacting the office for clarification of your results.   I appreciate the opportunity to care for you. Stan Head, MD, [facility]

## 2024-08-23 NOTE — Progress Notes (Signed)
 Troy [name] [id number] December 11, 1975 48 y.o.  Virtual Visit via Video Note  I connected with pt @ on 08/23/24 at 11:30 AM EST by a video enabled telemedicine application and verified that I am speaking with the correct person using two identifiers.   I discussed the limitations of evaluation and management by telemedicine and the availability of in person appointments. The patient expressed understanding and agreed to proceed.  I discussed the assessment and treatment plan with the patient. The patient was provided an opportunity to ask questions and all were answered. The patient agreed with the plan and demonstrated an understanding of the instructions.   The patient was advised to call back or seek an in-person evaluation if the symptoms worsen or if the condition fails to improve as anticipated.  I provided 25 minutes of non-face-to-face time during this encounter.  The patient was located at home.  The provider was located at [facility].   Angeline LOISE Sayers, NP   Subjective:   Patient ID:  Troy [name] is a 48 y.o. (DOB Nov 09, 1975) male.  Chief Complaint: No chief complaint on file.   HPI Troy [name] presents for follow-up of BPD, BPD2, PTSD, antisocial personality disorder, narcisscitic PD.  Describes mood today as ok. Pleasant. Denies tearfulness. Mood symptoms - denies depression. Reports stable interest and motivation. Reports some anxiety about health. Reports irritability at times - a little bit. Denies recent panic attacks. Reports over thinking and rumination - I always over think everything. Denies worry. Denies obsessions. Reports decreased mood swings - things are manageable. Stating I feel like I'm doing ok. Reports taking current medications as prescribed. Energy levels lower. Active, has a regular exercise routine - not able to do his normal workout. Enjoys some usual interests and activities. Single. Lives alone in an apartment. Parents  local. Has 2 sibling - estranged. Spending time with family. Appetite adequate. Weight stable. Reports difficulties getting to and staying asleep. Averages 6 to 9 hours. Not using CPAP machine consistently. Reports focus and concentration a lot of brain fog with the low hemoglobin. Completing tasks. Managing aspects of household. Reports he is permanently and totally disabled - strokes. Denies SI.  Denies HI.  Denies AH or VH. Denies self harm. Reports substance use - smoking THC. 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 Denies alcohol use.  Previous medication trials:  Trazadone, Gabapentin , Depakote, Wellbutrin , Zoloft , Sonata , Seroquel   Review of Systems:  Review of Systems  Musculoskeletal:  Negative for gait problem.  Neurological:  Negative for tremors.  Psychiatric/Behavioral:         Please refer to HPI    Medications: I have reviewed the patient's current medications.  Current Outpatient Medications  Medication Sig Dispense Refill   ammonium lactate (LAC-HYDRIN) 12 % lotion Apply 1 application topically as needed for dry skin.     aspirin  325 MG tablet Take 1 tablet (325 mg total) by mouth daily. 30 tablet 0   Azelastine  HCl 137 MCG/SPRAY SOLN Place 2 sprays into the nose 2 (two) times daily as needed. 30 mL 5   baclofen  (LIORESAL ) 10 MG tablet Take 1 tablet (10 mg total) by mouth 3 (three) times daily as needed. 30 tablet 5   bisoprolol  (ZEBETA ) 5 MG tablet TAKE 1 TABLET BY MOUTH AT BEDTIME 90 tablet 1   budesonide  (PULMICORT ) 0.5 MG/2ML nebulizer solution Mix with Xopenex  nebulizer solution every 6 hours as needed. 120 mL 2   clindamycin  (CLEOCIN  T) 1 %  lotion Apply topically as needed (rash). 60 mL 6   EPINEPHrine  0.3 mg/0.3 mL IJ SOAJ injection Inject 0.3 mg into the muscle as needed for anaphylaxis. 2 each 2   esomeprazole  (NEXIUM ) 40 MG capsule TAKE 1 CAPSULE BY MOUTH TWICE DAILY BEFORE A MEAL 180 capsule 3   Fluticasone -Umeclidin-Vilant (TRELEGY ELLIPTA )  200-62.5-25 MCG/ACT AEPB INHALE 1 PUFF ONCE DAILY 60 each 3   furosemide  (LASIX ) 20 MG tablet Take 1 tablet (20 mg total) by mouth 3 (three) times a week. 36 tablet 0   gabapentin  (NEURONTIN ) 300 MG capsule TAKE 2 CAPSULES BY MOUTH IN THE MORNING AND 2 IN THE AFTERNOON AND 4 CAPSULES AT BEDTIME 240 capsule 3   hydrocortisone  (ANUSOL -HC) 25 MG suppository Place 1 suppository (25 mg total) rectally 2 (two) times daily. 12 suppository 0   hydrocortisone  (ANUSOL -HC) 25 MG suppository Place 1 suppository (25 mg total) rectally at bedtime. 10 suppository 0   hydrOXYzine  (VISTARIL ) 25 MG capsule Take 1 capsule (25 mg total) by mouth every 8 (eight) hours as needed. 30 capsule 0   levalbuterol  (XOPENEX  HFA) 45 MCG/ACT inhaler INHALE 2 PUFFS BY MOUTH EVERY 4 HOURS AS NEEDED FOR WHEEZING 30 g 1   levalbuterol  (XOPENEX ) 1.25 MG/3ML nebulizer solution Take 1.25 mg by nebulization every 4 (four) hours as needed for wheezing. 120 mL 1   levothyroxine  (SYNTHROID ) 300 MCG tablet Take 300 mcg by mouth daily before breakfast.     LUER LOCK SAFETY SYRINGES 22G X 1-1/2 3 ML MISC USE FOR SELF ADMINISTER OF TESTOSTERONE  2CC ONCE EVERY 2 WEEKS     Melatonin 10 MG TABS Take by mouth at bedtime.     Multiple Vitamin (MULTI-VITAMIN) tablet Take 1 tablet by mouth daily.     ondansetron  (ZOFRAN ) 4 MG tablet Take 1 tablet (4 mg total) by mouth every 8 (eight) hours as needed for vomiting or nausea. 20 tablet 0   REPATHA  SURECLICK 140 MG/ML SOAJ INJECT 140 MG SUBCUTANEOUSLY ONCE EVERY 14 DAY 6 mL 0   risperiDONE  (RISPERDAL ) 1 MG tablet Take 1 tablet (1 mg total) by mouth at bedtime. 30 tablet 2   rosuvastatin  (CRESTOR ) 40 MG tablet Take 1 tablet by mouth once daily 90 tablet 2   sacubitril-valsartan (ENTRESTO ) 49-51 MG Take 1 tablet by mouth 2 (two) times daily. 180 tablet 3   Suvorexant  (BELSOMRA ) 10 MG TABS TAKE 1 TABLET BY MOUTH AT BEDTIME 30 tablet 5   Suvorexant  15 MG TABS Take 1 tablet (15 mg total) by mouth at bedtime.  30 tablet 2   tamsulosin  (FLOMAX ) 0.4 MG CAPS capsule Take 1 capsule by mouth once daily 90 capsule 3   testosterone  cypionate (DEPOTESTOSTERONE CYPIONATE) 200 MG/ML injection Inject 0.6 mLs (120 mg total) into the muscle once a week. 10 mL 0   traZODone  (DESYREL ) 150 MG tablet TAKE 2 TABLETS BY MOUTH AT BEDTIME 180 tablet 3   triamcinolone  (NASACORT ALLERGY 24HR) 55 MCG/ACT AERO nasal inhaler Place 2 sprays into the nose daily.     No current facility-administered medications for this visit.    Medication Side Effects: None  Allergies:  Allergies  Allergen Reactions   Ciprofloxacin Other (See Comments)    Spontaneous rupture of tendon   Emelia Sprawls Officinalis] Anaphylaxis and Hives   Dapagliflozin  Nausea And Vomiting    abscess  Patient refuses to give further information.   Fluocinolone Other (See Comments)    spontaneous rupture of tendons per pt dr.   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   CHF (congestive heart failure) (HCC)    Chronic fatigue    Depression    Drug abuse (HCC)    Ectopic atrial rhythm    Family history of adverse reaction to anesthesia    Dad  has problem with lots of medications   Family history of breast cancer 05/19/2021   Family history of colon cancer in mother    GERD (gastroesophageal reflux disease)    +Barretts   Hematochezia    hemorrhoids   Hepatic steatosis    +hepatomegaly   History of CVA (cerebrovascular accident)    radiation-induced carotid damage/changes   History of hepatitis C    +treated   History of hiatal hernia    History of Hodgkin's lymphoma    age 73->mantle RT + splenectomy   History of splenectomy    Hodgkin's lymphoma (HCC)    Hyperlipidemia    Hypertension    Insomnia    Male hypogonadism    Moderate persistent asthma     Obesity, Class I, BMI 30-34.9    Papillary thyroid  carcinoma (HCC) 2010   total thyroidectomy + neck dissection   Pneumonia    Postsurgical hypothyroidism    Prediabetes    Pulmonary fibrosis (HCC)    ?rad induced?   Sleep apnea    mild  cpap   Stroke [facility])    x2  2016  october no defecits memory affected   Thyroid  ca [facility])     Family History  Problem Relation Age of Onset   Breast cancer Mother        dx early 62s   Colon cancer Mother        dx early 40s   Other Mother        NTHL1 homozygous   Sleep apnea Mother    Parkinsonism Father    Multiple myeloma Father        dx 68s   Sleep apnea Father    Breast cancer Maternal Aunt    Stroke Maternal Grandmother    Leukemia Other        dx <20; two paternal first cousins once removed    Social History   Socioeconomic History   Marital status: Single    Spouse name: Not on file   Number of children: 0   Years of education: Not on file   Highest education level: Not on file  Occupational History   Occupation: nurse  Tobacco Use   Smoking status: Former    Current packs/day: 0.00    Types: Cigarettes    Quit date: 2013    Years since quitting: 12.8   Smokeless tobacco: Never  Vaping Use   Vaping status: Some Days   Substances: Flavoring  Substance and Sexual Activity   Alcohol use: Not Currently    Comment: occasionally   Drug use: Yes    Types: Marijuana    Comment: pt states I'm uncomfortable answering this question   Sexual activity: Yes  Other Topics Concern   Not on file  Social History Narrative   Are you right handed or left handed? Right   Are you currently employed ? No   What is your current occupation? None   Do you live at  home alone? yes   Who lives with you? no   What type of home do you live in: 1 story or 2 story? 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 : Never true  Transportation Needs: No Transportation Needs (10/24/2023)   Received from [facility]    In the past 12 months, has lack of reliable transportation kept you from medical appointments, meetings, work or from getting things needed for daily living? : No  Physical Activity: Not on file  Stress: Not on file  Social Connections: Not on file  Intimate Partner Violence: Not on file    Past Medical History, Surgical history, Social history, and Family history were reviewed and updated as appropriate.   Please see review of systems for further details on the patient's review from today.   Objective:   Physical Exam:  There were no vitals taken for this visit.  Physical Exam Constitutional:      General: He is not in acute distress. Musculoskeletal:        General: No deformity.  Neurological:     Mental Status: He is alert and oriented to person, place, and time.     Coordination: Coordination normal.  Psychiatric:        Attention and Perception: Attention and perception normal. He does not perceive auditory or visual hallucinations.        Mood and Affect: Mood normal. Mood is not anxious or depressed. Affect is not labile, blunt, angry or inappropriate.        Speech: Speech normal.        Behavior: Behavior normal.        Thought Content: Thought content normal. Thought content is not paranoid or delusional. Thought content does not include homicidal or suicidal ideation. Thought content does not include homicidal or suicidal plan.        Cognition and Memory: Cognition and memory normal.        Judgment: Judgment  normal.     Comments: Insight intact     Lab Review:     Component Value Date/Time   NA 136 12/14/2023 1503   NA 147 (H) 09/12/2023 1026   NA 134 (L) 12/10/2013 2132   K 4.7 12/14/2023 1503   K 4.5 12/10/2013 2132   CL 104 12/14/2023 1503   CL 104 12/10/2013 2132   CO2 22 12/14/2023 1503   CO2 26 12/10/2013 2132   GLUCOSE 92 12/14/2023 1503   GLUCOSE 86 12/10/2013 2132   BUN 16 12/14/2023 1503   BUN 19 09/12/2023 1026   BUN 15 12/10/2013 2132   CREATININE 0.92 12/14/2023 1503   CREATININE 1.14 12/10/2013 2132   CALCIUM  8.3 (L) 12/14/2023 1503   CALCIUM  8.2 (L) 12/10/2013 2132   PROT 7.0 09/12/2023 1026   PROT 7.8 12/10/2013 2132   ALBUMIN 4.3 09/12/2023  1026   ALBUMIN 3.5 12/10/2013 2132   AST 67 (H) 09/12/2023 1026   AST 30 12/10/2013 2132   ALT 107 (H) 09/12/2023 1026   ALT 32 12/10/2013 2132   ALKPHOS 169 (H) 09/12/2023 1026   ALKPHOS 47 12/10/2013 2132   BILITOT 0.5 09/12/2023 1026   BILITOT 0.3 12/10/2013 2132   GFRNONAA >60 12/14/2023 1503   GFRNONAA >60 12/10/2013 2132   GFRAA >60 12/19/2018 1152   GFRAA >60 12/10/2013 2132       Component Value Date/Time   WBC 11.3 (H) 08/08/2024 1506   WBC 10.7 (H) 08/03/2024 1449   RBC 3.83 (L) 08/08/2024 1506   HGB 12.5 (L) 08/08/2024 1506   HGB 17.3 09/05/2023 1017   HCT 36.2 (L) 08/08/2024 1506   HCT 49.9 09/05/2023 1017   PLT 336 08/08/2024 1506   PLT 227 09/05/2023 1017   MCV 94.5 08/08/2024 1506   MCV 96 09/05/2023 1017   MCV 93 12/10/2013 2132   MCH 32.6 08/08/2024 1506   MCHC 34.5 08/08/2024 1506   RDW 17.1 (H) 08/08/2024 1506   RDW 17.2 (H) 09/05/2023 1017   RDW 16.0 (H) 12/10/2013 2132   LYMPHSABS 2.7 08/08/2024 1506   LYMPHSABS 3.3 (H) 08/12/2020 1436   MONOABS 1.3 (H) 08/08/2024 1506   EOSABS 0.6 (H) 08/08/2024 1506   EOSABS 0.5 (H) 08/12/2020 1436   BASOSABS 0.1 08/08/2024 1506   BASOSABS 0.1 08/12/2020 1436    No results found for: POCLITH, LITHIUM   No results found for:  PHENYTOIN, PHENOBARB, VALPROATE, CBMZ   .res Assessment: Plan:    Plan:  PDMP reviewed  Risperdal  1mg  at bedtime  RTC 3 months  25 minutes spent dedicated to the care of this patient on the date of this encounter to include pre-visit review of records, ordering of medication, post visit documentation, and face-to-face time with the patient discussing BPD, BPD2, PTSD, antisocial personality disorder, narcisscitic PD. Discussed continuing Risperdal  1mg  at bedtime.   Patient advised to contact office with any questions, adverse effects, or acute worsening in signs and symptoms.  There are no diagnoses linked to this encounter.   Please see After Visit Summary for patient specific instructions.  Future Appointments  Date Time Provider Department Center  08/23/2024 11:30 AM Jane Birkel, Angeline Mattocks, NP [facility] None  08/23/2024  1:50 PM Avram Lupita BRAVO, MD [facility]  08/27/2024  4:00 PM Schuyler Damien HERO [facility] [id number]  09/12/2024  1:00 PM Schuyler Damien HERO [facility] [id number]  09/21/2024 11:30 AM Avram Lupita BRAVO, MD [facility]  09/26/2024  1:00 PM Schuyler Damien HERO [facility] [id number]  09/26/2024  3:00 PM Thedora, Garnette HERO, MD [facility]  10/10/2024  1:00 PM Schuyler Damien HERO [facility] [id number]  11/21/2024  2:45 PM Whitfield Raisin, NP [facility] None  11/27/2024 11:15 AM Iva Marty Saltness, MD [facility] None    No orders of the defined types were placed in this encounter.     -------------------------------

## 2024-08-23 NOTE — Progress Notes (Signed)
 Troy [name] 48 y.o. 1976-05-31 [id number]  Assessment & Plan:   Encounter Diagnoses  Name Primary?   Prolapsed internal hemorrhoids, grade 2 Yes   Rectal bleeding    Mild anemia    Dizziness and giddiness     His hemorrhoids are not symptomatic at this time so we will not banded further.  He knows to call back as needed.  He will continue iron supplementation and I will recheck his hemoglobin today.  His hemoglobin decreases minimal and I am doubtful that low hemoglobin and ferritin of 27 are responsible for this dizziness or lightheadedness when he is lifting weights.  He thinks his hemoglobin is usually much higher so that is why, perhaps.  Hopefully will be improving he will continue iron supplementation.   Subjective:   Chief Complaint:  HPI 48 year old man status post banding of the right posterior internal hemorrhoid complex on 08/10/2024, he was experiencing rectal bleeding and prolapse issues.  He is a power lifter.  He is continuing to get dizzy sometimes with squats and lifting and would like his blood count rechecked.  He did start iron supplementation after the last visit.  There has been no bleeding since day 3 after the banding and no prolapse.      Latest Ref Rng & Units 08/08/2024    3:06 PM 08/03/2024    2:49 PM 07/11/2024    4:14 PM  CBC  WBC 4.0 - 10.5 K/uL 11.3  10.7  9.2   Hemoglobin 13.0 - 17.0 g/dL 87.4  86.0  85.1   Hematocrit 39.0 - 52.0 % 36.2  42.3  45.1   Platelets 150 - 400 K/uL 336  285.0  212.0    Lab Results  Component Value Date   FERRITIN 27 08/08/2024     Allergies  Allergen Reactions   Ciprofloxacin Other (See Comments)    Spontaneous rupture of tendon   Emelia Sprawls Officinalis] Anaphylaxis and Hives   Dapagliflozin  Nausea And Vomiting    abscess  Patient refuses to give further information.   Fluocinolone Other (See Comments)    spontaneous rupture of tendons per pt dr.   [place]  Rash   Current Meds   Medication Sig   ammonium lactate (LAC-HYDRIN) 12 % lotion Apply 1 application topically as needed for dry skin.   aspirin  325 MG tablet Take 1 tablet (325 mg total) by mouth daily.   Azelastine  HCl 137 MCG/SPRAY SOLN Place 2 sprays into the nose 2 (two) times daily as needed.   baclofen  (LIORESAL ) 10 MG tablet Take 1 tablet (10 mg total) by mouth 3 (three) times daily as needed.   bisoprolol  (ZEBETA ) 5 MG tablet TAKE 1 TABLET BY MOUTH AT BEDTIME   budesonide  (PULMICORT ) 0.5 MG/2ML nebulizer solution Mix with Xopenex  nebulizer solution every 6 hours as needed.   clindamycin  (CLEOCIN  T) 1 % lotion Apply topically as needed (rash).   EPINEPHrine  0.3 mg/0.3 mL IJ SOAJ injection Inject 0.3 mg into the muscle as needed for anaphylaxis.   esomeprazole  (NEXIUM ) 40 MG capsule TAKE 1 CAPSULE BY MOUTH TWICE DAILY BEFORE A MEAL   Fluticasone -Umeclidin-Vilant (TRELEGY ELLIPTA ) 200-62.5-25 MCG/ACT AEPB INHALE 1 PUFF ONCE DAILY   furosemide  (LASIX ) 20 MG tablet Take 1 tablet (20 mg total) by mouth 3 (three) times a week.   gabapentin  (NEURONTIN ) 300 MG capsule TAKE 2 CAPSULES BY MOUTH IN THE MORNING AND 2 IN THE AFTERNOON AND 4 CAPSULES AT BEDTIME   hydrocortisone  (ANUSOL -HC) 25 MG suppository Place 1 suppository (  25 mg total) rectally 2 (two) times daily.   hydrocortisone  (ANUSOL -HC) 25 MG suppository Place 1 suppository (25 mg total) rectally at bedtime.   hydrOXYzine  (VISTARIL ) 25 MG capsule Take 1 capsule (25 mg total) by mouth every 8 (eight) hours as needed.   levalbuterol  (XOPENEX  HFA) 45 MCG/ACT inhaler INHALE 2 PUFFS BY MOUTH EVERY 4 HOURS AS NEEDED FOR WHEEZING   levalbuterol  (XOPENEX ) 1.25 MG/3ML nebulizer solution Take 1.25 mg by nebulization every 4 (four) hours as needed for wheezing.   levothyroxine  (SYNTHROID ) 300 MCG tablet Take 300 mcg by mouth daily before breakfast.   LUER LOCK SAFETY SYRINGES 22G X 1-1/2 3 ML MISC USE FOR SELF ADMINISTER OF TESTOSTERONE  2CC ONCE EVERY 2 WEEKS    Melatonin 10 MG TABS Take by mouth at bedtime.   Multiple Vitamin (MULTI-VITAMIN) tablet Take 1 tablet by mouth daily.   ondansetron  (ZOFRAN ) 4 MG tablet Take 1 tablet (4 mg total) by mouth every 8 (eight) hours as needed for vomiting or nausea.   REPATHA  SURECLICK 140 MG/ML SOAJ INJECT 140 MG SUBCUTANEOUSLY ONCE EVERY 14 DAY   risperiDONE  (RISPERDAL ) 1 MG tablet Take 1 tablet (1 mg total) by mouth at bedtime.   rosuvastatin  (CRESTOR ) 40 MG tablet Take 1 tablet by mouth once daily   sacubitril-valsartan (ENTRESTO ) 49-51 MG Take 1 tablet by mouth 2 (two) times daily.   Suvorexant  (BELSOMRA ) 10 MG TABS TAKE 1 TABLET BY MOUTH AT BEDTIME   Suvorexant  15 MG TABS Take 1 tablet (15 mg total) by mouth at bedtime.   tamsulosin  (FLOMAX ) 0.4 MG CAPS capsule Take 1 capsule by mouth once daily   testosterone  cypionate (DEPOTESTOSTERONE CYPIONATE) 200 MG/ML injection Inject 0.6 mLs (120 mg total) into the muscle once a week.   traZODone  (DESYREL ) 150 MG tablet TAKE 2 TABLETS BY MOUTH AT BEDTIME   triamcinolone  (NASACORT ALLERGY 24HR) 55 MCG/ACT AERO nasal inhaler Place 2 sprays into the nose daily.   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   CHF (congestive heart failure) (HCC)    Chronic fatigue    Depression    Drug abuse (HCC)    Ectopic atrial rhythm    Family history of adverse reaction to anesthesia    Dad  has problem with lots of medications   Family history of breast cancer 05/19/2021   Family history of colon cancer in mother    GERD (gastroesophageal reflux disease)    +Barretts   Hematochezia    hemorrhoids   Hepatic steatosis    +hepatomegaly   History of CVA (cerebrovascular accident)    radiation-induced carotid damage/changes   History of hepatitis C    +treated   History of hiatal hernia    History of Hodgkin's  lymphoma    age 27->mantle RT + splenectomy   History of splenectomy    Hodgkin's lymphoma (HCC)    Hyperlipidemia    Hypertension    Insomnia    Male hypogonadism    Moderate persistent asthma    Obesity, Class I, BMI 30-34.9    Papillary thyroid  carcinoma (HCC) 2010   total thyroidectomy + neck dissection   Pneumonia    Postsurgical hypothyroidism    Prediabetes    Pulmonary fibrosis (HCC)    ?rad induced?   Sleep  apnea    mild  cpap   Stroke [facility])    x2  2016  october no defecits memory affected   Thyroid  ca [facility])    Past Surgical History:  Procedure Laterality Date   COLONOSCOPY  07/04/2019   done for hematochezia and FH col ca-mother: Mod int hem w/inflamm.  No adenomatous polyps.   HEMORRHOID BANDING     INCISIONAL HERNIA REPAIR  06/2019   RUQ (prev lap append site)   KNEE ARTHROSCOPY WITH MENISCAL REPAIR Right 12/23/2023   Procedure: KNEE ARTHROSCOPY WITH POSSIBLE LATERAL  MENISCAL REPAIR;  Surgeon: Sharl Selinda Dover, MD;  Location: [facility];  Service: Orthopedics;  Laterality: Right;   LAPAROSCOPIC APPENDECTOMY N/A 03/25/2017   Procedure: APPENDECTOMY LAPAROSCOPIC; EXTENSIVE LYSIS OF ADHESIONS FROM PRIOR RADIATION AND SURGERY;  Surgeon: Gladis Cough, MD;  Location: [facility];  Service: General;  Laterality: N/A;   MENISCUS REPAIR Left 09/16/2023   Procedure: REPAIR OF LATERAL MENISCUS;  Surgeon: Sharl Selinda Dover, MD;  Location: [facility];  Service: Orthopedics;  Laterality: Left;  120 block in preop   PARATHYROIDECTOMY     SPLENECTOMY     TEE WITHOUT CARDIOVERSION N/A 07/18/2015   Procedure: TRANSESOPHAGEAL ECHOCARDIOGRAM (TEE);  Surgeon: Aleene JINNY Passe, MD;  Location: [facility];  Service: Cardiovascular;  Laterality: N/A;   THYROIDECTOMY     Social History   Social History Narrative   Are you right handed or left handed? Right   Are you currently employed ? No   What is your current occupation? None   Do you live at home alone? yes    Who lives with you? no   What type of home do you live in: 1 story or 2 story? 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   Review of Systems  As per HPI Objective:   Physical Exam BP 116/70 (BP Location: Left Arm, Patient Position: Sitting, Cuff Size: Large)   Pulse 85   Ht 6' (1.829 m)   Wt 218 lb (98.9 kg)   BMI 29.57 kg/m

## 2024-08-25 ENCOUNTER — Ambulatory Visit: Payer: Self-pay | Admitting: Internal Medicine

## 2024-08-27 ENCOUNTER — Ambulatory Visit (INDEPENDENT_AMBULATORY_CARE_PROVIDER_SITE_OTHER): Admitting: Licensed Clinical Social Worker

## 2024-08-27 DIAGNOSIS — F603 Borderline personality disorder: Secondary | ICD-10-CM | POA: Diagnosis not present

## 2024-08-27 DIAGNOSIS — F3181 Bipolar II disorder: Secondary | ICD-10-CM

## 2024-08-27 DIAGNOSIS — F4310 Post-traumatic stress disorder, unspecified: Secondary | ICD-10-CM | POA: Diagnosis not present

## 2024-08-28 NOTE — Progress Notes (Signed)
 Troy [name]                                          MRN: [id number]   08/28/2024   The [facility] Quality Team Specialist reviewed this patient medical record for the purposes of chart review for care gap closure. The following were reviewed: chart review for care gap closure-diabetic eye exam, glycemic status assessment, and kidney health evaluation for diabetes:eGFR  and uACR.    [facility] Quality Team

## 2024-08-28 NOTE — Progress Notes (Signed)
 [facility] Counselor/Therapist Progress Note  Patient ID: STETSON PELAEZ, MRN: [id number]    Date: 08/27/2024  Time Spent: 0401  pm - 0503 pm : 62 Minutes  Treatment Type: Individual Therapy.  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 Patient reports both parents are ill.    Mental Status Exam: Appearance:  Casual     Behavior: Appropriate  Motor: Normal  Speech/Language:  Clear and Coherent  Affect: Appropriate  Mood: normal  Thought process: normal  Thought content:   WNL  Sensory/Perceptual disturbances:   WNL  Orientation: oriented to person, place, time/date, situation, day of week, month of year, and year  Attention: Good  Concentration: Good  Memory: WNL  Fund of knowledge:  Good  Insight:   Good  Judgment:  Good  Impulse Control: Good    Risk Assessment: Danger to Self:  No Self-injurious Behavior: No Danger to Others: No Duty to Warn:no Physical Aggression / Violence:No  Access to Firearms a concern: No  Gang Involvement:No    Subjective:    [name] participated in person from office, located at [employer]. Ekam consented to treatment. Therapist participated from office located at [facility].    Nathanael presented for his session with a positive attitude. He reports that he has been tested for colon cancer and that the doctor sent the specimens off for further evaluation. Karandeep states that he does not have cancer and states he was relieved. Patient reports that he continues to help out his parents and try to see them every day. Jammy states that he still hasn't had any contact with his siblings. He reports that he hasn't heard of any plans for Thanksgiving. He states that even his family goes to his brothers they will  still do something with him. Patient reports that he has got to a place that he doesn't expect his siblings to ever come around.  Clinician actively listened and provided support to patient. Clinician processed with patient his concerns and provided support and validated his feelings. We discussed problem solving techniques with his family, which he reports have all been attempted in the past. Clinician and patient discussed patient focusing on what he can control and utilize the following skills: Focus on your own healing Forgive yourself: Acknowledge the hurt you've endured and give yourself grace for your reactions and for navigating a difficult situation.  Let go of the need for their forgiveness: Recognize that forgiveness is a personal process that cannot be forced or rushed, and your healing doesn't have to be dependent on their acceptance.  Process your emotions: Allow yourself to feel and normalize your emotions about the situation. You can work on releasing negative feelings without being directly involved with your family, which may involve seeking professional help.  Set healthy boundaries Adjust your expectations: Accept that the relationship may never be what you hoped for. Mourn the loss of that fantasy of a perfect relationship and focus on the reality of the present.  Limit contact: If necessary, reduce contact with family members who refuse to forgive or acknowledge the harm they've caused.  Change your responses: Instead of matching their energy, find ways to respond calmly, such as by remaining quiet, leaving the room, or shifting the conversation to a different topic.  Build  other support systems Find support elsewhere: Lean on other family members who are willing to offer support or build connections with friends who can provide a sense of family.  Seek professional help: Consider speaking with a therapist or counselor who can provide a safe space to discuss your feelings and work  through the conflict.  Consider the future Live your best life: Focus your energy on your own healing, personal growth, and building a happy life, regardless of whether the relationship is repaired.  Be patient (if you choose): If you wish to keep the door open for potential future reconciliation, be patient and continue to love them, while also accepting the current consequences of their actions.    Daryl was engaged in discussion and was polite and cooperative. He was motivated for treatment as evidenced by his transparency and eagerness to utilize coping skills and improve his symptoms. Patient is to use CBT, mindfulness and coping skills to help manage decrease symptoms associated with their diagnosis. Reduce overall level, frequency, and intensity of the feelings of depression, anxiety and panic evidenced by decreased irritability, negative self talk, and helpless feelings from 6 to 7 days/week to 0 to 1 days/week per client report for at least 3 consecutive months. Treatment planning to be reviewed by 05/02/2025.   Interventions: Cognitive Behavioral Therapy, Dialectical Behavioral Therapy, Assertiveness/Communication, Mindfulness Meditation, Motivational Interviewing, and Solution-Oriented/Positive Psychology   Diagnosis: PTSD, bipolar II disorder, Borderline Personality Disorder     [name] MSW, LCSW/DATE 08/27/2024

## 2024-08-31 ENCOUNTER — Ambulatory Visit: Admitting: Gastroenterology

## 2024-09-02 ENCOUNTER — Other Ambulatory Visit: Payer: Self-pay | Admitting: Internal Medicine

## 2024-09-12 ENCOUNTER — Ambulatory Visit: Admitting: Licensed Clinical Social Worker

## 2024-09-13 ENCOUNTER — Ambulatory Visit: Admitting: Licensed Clinical Social Worker

## 2024-09-15 ENCOUNTER — Other Ambulatory Visit: Payer: Self-pay | Admitting: Internal Medicine

## 2024-09-15 DIAGNOSIS — E785 Hyperlipidemia, unspecified: Secondary | ICD-10-CM

## 2024-09-15 DIAGNOSIS — I700 Atherosclerosis of aorta: Secondary | ICD-10-CM

## 2024-09-15 DIAGNOSIS — I2510 Atherosclerotic heart disease of native coronary artery without angina pectoris: Secondary | ICD-10-CM

## 2024-09-21 ENCOUNTER — Encounter: Payer: Self-pay | Admitting: Internal Medicine

## 2024-09-21 ENCOUNTER — Encounter: Admitting: Internal Medicine

## 2024-09-21 ENCOUNTER — Other Ambulatory Visit: Payer: Self-pay | Admitting: Allergy & Immunology

## 2024-09-22 ENCOUNTER — Other Ambulatory Visit: Payer: Self-pay | Admitting: Family Medicine

## 2024-09-22 DIAGNOSIS — G8929 Other chronic pain: Secondary | ICD-10-CM

## 2024-09-22 DIAGNOSIS — L299 Pruritus, unspecified: Secondary | ICD-10-CM

## 2024-09-22 DIAGNOSIS — F5104 Psychophysiologic insomnia: Secondary | ICD-10-CM

## 2024-09-23 ENCOUNTER — Other Ambulatory Visit: Payer: Self-pay | Admitting: Medical Genetics

## 2024-09-25 NOTE — Assessment & Plan Note (Signed)
 Compensated. Continue bisoprolol  5 mg daily, sacubitril-valsartan 49-51 mg bid, and PRN furosemide  20 mg.

## 2024-09-25 NOTE — Assessment & Plan Note (Signed)
 Blood pressure is in good control. Continue bisoprolol  5 mg daily and sacubitril-valsartan 49-51 mg bid.

## 2024-09-25 NOTE — Assessment & Plan Note (Signed)
 Continue suvorexant  15 mg nightly.

## 2024-09-25 NOTE — Progress Notes (Signed)
 [facility] [address] [place] [address] Dept: [phone] Dept Fax: [phone]  Chronic Care Office Visit  Subjective:    Patient ID: CASSANDRA MCMANAMAN, male    DOB: 1975-12-02, 48 y.o..   MRN: [id number]  No chief complaint on file.  History of Present Illness:  Patient is in today for reassessment of chronic medical conditions.   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 He is currently managed on bisoprolol  5 mg daily, sacubitril-valsartan 49-51 mg bid, and PRN furosemide  20 mg. He denies any current significant edema and is not having dyspnea or orthopnea.   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 He is following with hematology.   Mr. Misch has a long-standing history of insomnia. We had some difficulty managing this and had tried multiple other medications, including hydroxyzine , trazodone  300 mg, melatonin, GABA, tryptophan, zaleplon  (Sonata ) and suvorexant  (Belsomra ). Currently he is using suvorexant  (Belsomra ) 15 mg nightly, increased from 10 mg at his last visit.     Past Medical History: Patient Active Problem List   Diagnosis Date Noted   Prolapsed internal hemorrhoids, grade 2 08/10/2024   PTSD (post-traumatic stress disorder) 05/16/2024   Bipolar II disorder (HCC) 05/16/2024   Borderline personality disorder (HCC) 05/16/2024   Rupture of anterior cruciate ligament of both knees 08/18/2023   Scrotal abscess 08/06/2023   Polycythemia, secondary 04/22/2023   Moderate persistent asthma with exacerbation 03/29/2023   SVT (supraventricular tachycardia) 12/30/2022   Aortic atherosclerosis 12/30/2022   HFrEF (heart failure with reduced ejection fraction) (HCC) 12/30/2022   Blood pressure alteration 12/30/2022   Erythrocytosis 11/13/2022   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   Lower  urinary tract symptoms (LUTS) 06/02/2022   Rotator cuff tear, right 03/03/2022   History of squamous cell carcinoma in situ (SCCIS) of skin 02/22/2022   Osteoarthritis of acromioclavicular joint 10/15/2021   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   History of splenectomy 02/02/2021   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   Erectile dysfunction 02/02/2021   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   Pulmonary fibrosis (HCC) 02/02/2021   Peripheral neuropathy 02/02/2021   History of hepatitis C 02/02/2021   Back pain 02/02/2021   Hyperlipidemia 02/02/2021   Moderate persistent asthma, uncomplicated 08/13/2020   History of Hodgkin's lymphoma 08/13/2020   Chronic rhinitis 08/13/2020   Not currently working due to disabled status 08/13/2020   Pain in joint of left knee 07/25/2020   History of thyroid  cancer 06/09/2020   Arthralgia of left elbow 05/01/2020   Autonomic dysfunction 04/23/2020   Iron deficiency anemia due to chronic blood loss 01/18/2020   Microalbuminuria 01/18/2020   Chronic insomnia 01/17/2020   Large liver 01/17/2020   Prediabetes 01/11/2019   Daytime somnolence 07/22/2017   History of basal cell carcinoma (BCC) 11/30/2016   GERD (gastroesophageal reflux disease) 08/25/2016   Essential hypertension 08/25/2016   Substance induced mood disorder (HCC)    History of stroke    Past Surgical History:  Procedure Laterality Date   COLONOSCOPY  07/04/2019   done for hematochezia and FH col ca-mother: Mod int hem w/inflamm.  No adenomatous polyps.   HEMORRHOID BANDING  07/2024   INCISIONAL HERNIA REPAIR  06/2019   RUQ (prev lap append site)   KNEE ARTHROSCOPY WITH MENISCAL REPAIR Right 12/23/2023   Procedure: KNEE ARTHROSCOPY WITH POSSIBLE  LATERAL  MENISCAL REPAIR;  Surgeon: Sharl Selinda Dover, MD;  Location: [facility];  Service: Orthopedics;  Laterality: Right;   LAPAROSCOPIC APPENDECTOMY N/A 03/25/2017   Procedure: APPENDECTOMY  LAPAROSCOPIC; EXTENSIVE LYSIS OF ADHESIONS FROM PRIOR RADIATION AND SURGERY;  Surgeon: Gladis Cough, MD;  Location: [facility];  Service: General;  Laterality: N/A;   MENISCUS REPAIR Left 09/16/2023   Procedure: REPAIR OF LATERAL MENISCUS;  Surgeon: Sharl Selinda Dover, MD;  Location: [facility];  Service: Orthopedics;  Laterality: Left;  120 block in preop   PARATHYROIDECTOMY     SPLENECTOMY     TEE WITHOUT CARDIOVERSION N/A 07/18/2015   Procedure: TRANSESOPHAGEAL ECHOCARDIOGRAM (TEE);  Surgeon: Aleene JINNY Passe, MD;  Location: [facility];  Service: Cardiovascular;  Laterality: N/A;   THYROIDECTOMY     Family History  Problem Relation Age of Onset   Breast cancer Mother        dx early 59s   Colon cancer Mother        dx early 7s   Other Mother        NTHL1 homozygous   Sleep apnea Mother    Parkinsonism Father    Multiple myeloma Father        dx 80s   Sleep apnea Father    Breast cancer Maternal Aunt    Stroke Maternal Grandmother    Leukemia Other        dx <20; two paternal first cousins once removed   Outpatient Medications Prior to Visit  Medication Sig Dispense Refill   ENTRESTO  49-51 MG Take 1 tablet by mouth twice daily 60 tablet 0   ammonium lactate (LAC-HYDRIN) 12 % lotion Apply 1 application topically as needed for dry skin.     aspirin  325 MG tablet Take 1 tablet (325 mg total) by mouth daily. 30 tablet 0   Azelastine  HCl 137 MCG/SPRAY SOLN Place 2 sprays into the nose 2 (two) times daily as needed. 30 mL 5   baclofen  (LIORESAL ) 10 MG tablet Take 1 tablet by mouth three times daily as needed 30 tablet 11   bisoprolol  (ZEBETA ) 5 MG tablet TAKE 1 TABLET BY MOUTH AT BEDTIME 90 tablet 1   budesonide  (PULMICORT ) 0.5 MG/2ML nebulizer solution Mix with Xopenex  nebulizer solution every 6 hours as needed. 120 mL 2   clindamycin  (CLEOCIN  T) 1 % lotion Apply topically as needed (rash). 60 mL 6   EPINEPHrine  0.3 mg/0.3 mL IJ SOAJ injection Inject 0.3 mg into  the muscle as needed for anaphylaxis. 2 each 2   esomeprazole  (NEXIUM ) 40 MG capsule TAKE 1 CAPSULE BY MOUTH TWICE DAILY BEFORE A MEAL 180 capsule 3   Fluticasone -Umeclidin-Vilant (TRELEGY ELLIPTA ) 200-62.5-25 MCG/ACT AEPB INHALE 1 PUFF ONCE DAILY 60 each 3   furosemide  (LASIX ) 20 MG tablet Take 1 tablet (20 mg total) by mouth 3 (three) times a week. 36 tablet 0   gabapentin  (NEURONTIN ) 300 MG capsule TAKE 2 CAPSULES BY MOUTH IN THE MORNING AND 2 IN THE AFTERNOON AND 4 CAPSULES AT BEDTIME 240 capsule 3   hydrocortisone  (ANUSOL -HC) 25 MG suppository Place 1 suppository (25 mg total) rectally 2 (two) times daily. 12 suppository 0   hydrocortisone  (ANUSOL -HC) 25 MG suppository Place 1 suppository (25 mg total) rectally at bedtime. 10 suppository 0   hydrOXYzine  (VISTARIL ) 25 MG capsule TAKE 1 CAPSULE BY MOUTH EVERY 8 HOURS AS NEEDED 30 capsule 5   levalbuterol  (XOPENEX  HFA) 45 MCG/ACT inhaler INHALE 2 PUFFS BY MOUTH EVERY 4 HOURS AS NEEDED  FOR WHEEZING 30 g 1   levalbuterol  (XOPENEX ) 1.25 MG/3ML nebulizer solution Take 1.25 mg by nebulization every 4 (four) hours as needed for wheezing. 120 mL 1   levothyroxine  (SYNTHROID ) 300 MCG tablet Take 300 mcg by mouth daily before breakfast.     LUER LOCK SAFETY SYRINGES 22G X 1-1/2 3 ML MISC USE FOR SELF ADMINISTER OF TESTOSTERONE  2CC ONCE EVERY 2 WEEKS     Melatonin 10 MG TABS Take by mouth at bedtime.     Multiple Vitamin (MULTI-VITAMIN) tablet Take 1 tablet by mouth daily.     ondansetron  (ZOFRAN ) 4 MG tablet Take 1 tablet (4 mg total) by mouth every 8 (eight) hours as needed for vomiting or nausea. 20 tablet 0   REPATHA  SURECLICK 140 MG/ML SOAJ INJECT 140 MG INTO THE SKIN EVERY 14 DAYS 6 mL 0   risperiDONE  (RISPERDAL ) 1 MG tablet Take 1 tablet (1 mg total) by mouth at bedtime. 30 tablet 2   rosuvastatin  (CRESTOR ) 40 MG tablet Take 1 tablet by mouth once daily 90 tablet 2   Suvorexant  (BELSOMRA ) 10 MG TABS TAKE 1 TABLET BY MOUTH AT BEDTIME 30 tablet 5    Suvorexant  (BELSOMRA ) 15 MG TABS TAKE 1 TABLET BY MOUTH AT BEDTIME 30 tablet 5   tamsulosin  (FLOMAX ) 0.4 MG CAPS capsule Take 1 capsule by mouth once daily 90 capsule 3   testosterone  cypionate (DEPOTESTOSTERONE CYPIONATE) 200 MG/ML injection Inject 0.6 mLs (120 mg total) into the muscle once a week. 10 mL 0   traZODone  (DESYREL ) 150 MG tablet TAKE 2 TABLETS BY MOUTH AT BEDTIME 180 tablet 3   triamcinolone  (NASACORT ALLERGY 24HR) 55 MCG/ACT AERO nasal inhaler Place 2 sprays into the nose daily.     No facility-administered medications prior to visit.   Allergies[1]   Objective:   There were no vitals filed for this visit. There is no height or weight on file to calculate BMI.   General: Well developed, well nourished. No acute distress. HEENT: Normocephalic, non-traumatic. PERRL, EOMI. Conjunctiva clear. External ears normal. EAC and TMs normal bilaterally. Nose    clear without congestion or rhinorrhea. Mucous membranes moist. Oropharynx clear. Good dentition. Neck: Supple. No lymphadenopathy. No thyromegaly. Lungs: Clear to auscultation bilaterally. No wheezing, rales or rhonchi. CV: RRR without murmurs or rubs. Pulses 2+ bilaterally. Abdomen: Soft, non-tender. Bowel sounds positive, normal pitch and frequency. No hepatosplenomegaly. No rebound or guarding. Back: Straight. No CVA tenderness bilaterally. Extremities: Full ROM. No joint swelling or tenderness. No edema noted. Skin: Warm and dry. No rashes. Neuro: CN II-XII intact. Normal sensation and DTR bilaterally. Psych: Alert and oriented. Normal mood and affect.  Health Maintenance Due  Topic Date Due   Hepatitis B Vaccines 19-59 Average Risk (3 of 3 - 19+ 3-dose series) 10/07/2005   Pneumococcal Vaccine (3 of 3 - PCV20 or PCV21) 09/01/2020   Medicare Annual Wellness (AWV)  05/22/2021   COVID-19 Vaccine (6 - 2025-26 season) 06/11/2024    Imaging  DG Chest 2 View Result Date: 07/16/2024 IMPRESSION: 1. No acute  abnormalities.   Lab Results {Labs (Optional):29002}    Assessment & Plan:   Problem List Items Addressed This Visit   None   No follow-ups on file.   Garnette CHRISTELLA Simpler, MD  I,Emily Lagle,acting as a scribe for Garnette CHRISTELLA Simpler, MD.,have documented all relevant documentation on the behalf of Garnette CHRISTELLA Simpler, MD.  I, Garnette CHRISTELLA Simpler, MD, have reviewed all documentation for this visit. The documentation on 09/26/2024 for  the exam, diagnosis, procedures, and orders are all accurate and complete.    [1]  Allergies Allergen Reactions   Ciprofloxacin Other (See Comments)    Spontaneous rupture of tendon   Emelia Sprawls Officinalis] Anaphylaxis and Hives   Dapagliflozin  Nausea And Vomiting    abscess  Patient refuses to give further information.   Fluocinolone Other (See Comments)    spontaneous rupture of tendons per pt dr.   Sharlyn  Rash

## 2024-09-26 ENCOUNTER — Ambulatory Visit: Admitting: Family Medicine

## 2024-09-26 ENCOUNTER — Encounter: Payer: Self-pay | Admitting: Family Medicine

## 2024-09-26 ENCOUNTER — Ambulatory Visit: Admitting: Licensed Clinical Social Worker

## 2024-09-26 VITALS — BP 140/78 | HR 85 | Temp 98.4°F | Ht 72.0 in | Wt 212.2 lb

## 2024-09-26 DIAGNOSIS — F4310 Post-traumatic stress disorder, unspecified: Secondary | ICD-10-CM | POA: Diagnosis not present

## 2024-09-26 DIAGNOSIS — F5104 Psychophysiologic insomnia: Secondary | ICD-10-CM

## 2024-09-26 DIAGNOSIS — I110 Hypertensive heart disease with heart failure: Secondary | ICD-10-CM | POA: Diagnosis not present

## 2024-09-26 DIAGNOSIS — F6081 Narcissistic personality disorder: Secondary | ICD-10-CM

## 2024-09-26 DIAGNOSIS — D500 Iron deficiency anemia secondary to blood loss (chronic): Secondary | ICD-10-CM | POA: Diagnosis not present

## 2024-09-26 DIAGNOSIS — I5020 Unspecified systolic (congestive) heart failure: Secondary | ICD-10-CM | POA: Diagnosis not present

## 2024-09-26 DIAGNOSIS — F603 Borderline personality disorder: Secondary | ICD-10-CM

## 2024-09-26 DIAGNOSIS — F3181 Bipolar II disorder: Secondary | ICD-10-CM

## 2024-09-26 DIAGNOSIS — I10 Essential (primary) hypertension: Secondary | ICD-10-CM

## 2024-09-26 LAB — CBC
HCT: 45.6 % (ref 39.0–52.0)
Hemoglobin: 15.1 g/dL (ref 13.0–17.0)
MCHC: 33 g/dL (ref 30.0–36.0)
MCV: 99 fl (ref 78.0–100.0)
Platelets: 255 K/uL (ref 150.0–400.0)
RBC: 4.61 Mil/uL (ref 4.22–5.81)
RDW: 16.7 % — ABNORMAL HIGH (ref 11.5–15.5)
WBC: 7.9 K/uL (ref 4.0–10.5)

## 2024-09-26 NOTE — Assessment & Plan Note (Signed)
 I will reassess a CBC today to confirm resolution.

## 2024-09-30 NOTE — Progress Notes (Signed)
 [facility] Counselor/Therapist Progress Note  Patient ID: Troy [name], MRN: [id number]    Date: 09/30/2024  Time Spent: 104  pm - 0200 pm : 56 Minutes  Treatment Type: Individual Therapy.  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 Patient reports both parents are ill.    Mental Status Exam: Appearance:  Casual     Behavior: Appropriate  Motor: Normal  Speech/Language:  Clear and Coherent  Affect: Appropriate  Mood: normal  Thought process: normal  Thought content:   WNL  Sensory/Perceptual disturbances:   WNL  Orientation: oriented to person, place, time/date, situation, day of week, month of year, and year  Attention: Good  Concentration: Good  Memory: WNL  Fund of knowledge:  Good  Insight:   Good  Judgment:  Good  Impulse Control: Good    Risk Assessment: Danger to Self:  No Self-injurious Behavior: No Danger to Others: No Duty to Warn:no Physical Aggression / Violence:No  Access to Firearms a concern: No  Gang Involvement:No    Subjective:    [name] participated in person from office, located at [employer]. Muad consented to treatment. Therapist participated from office located at [facility].   Jovoni presented for his session stating that there have still been no progress in rebuilding the relationship with his family. Darreon states that he wen to his parents for Thanksgiving because the family didn't get together. He reports that his parents were very rude to him and he felt that he would have rather been alone. They are very political according to patient and that weighs heavy along with his past addiction on the relationship. Patient states that for 10 years he has been clean but his family will not accept it and still  accuse him of using. He states that he feels that he has his gym friends and that is the extent of that. He reports that he has accepted that there is nothing he can do to change the way his family feels about him and all he can do is live his life the way he knows would make him proud of the life he is leading and hope that they will open their eyes.  Clinician actively listened and processed with patient his thoughts and concerns. Clinician and patient processed the following ideas. Make Sincere Amends: Offer a genuine apology, take responsibility, and demonstrate changed behavior, then let go of controlling their response. Focus on Your Healing: Forgiveness is for you. Release the anger and resentment so it doesn't consume your energy, regardless of their reaction. Build Your Chosen Family: Cultivate strong, positive relationships with supportive friends, therapists, or communities. Set Boundaries: Limit contact or go low contact if interactions remain toxic; you don't have to force reconciliation if it's unhealthy. Armenta the Loss: Acknowledge the pain of the broken relationship and allow yourself to mourn it, as BetterHelp suggests.  Strategies for When Others Won't Forgive You Understand You Can't Force It: You can apologize and change, but you can't make someone else forgive you. Focus on Your Actions: Consistently show you've changed through your actions, not just words, and focus on living a good life. Seek Support: Talk to a therapist or support group to process your feelings and find healthy ways to cope with rejection.  Strategies for Dealing  with Dysfunctional Family Dynamics Accept Reality: Acknowledge that you can't change them; focus on what you can control--your own life and reactions. Prioritize Self-Care: Engage in hobbies, find joy, and build habits that nurture your well-being. Manage Interactions: Keep family gatherings calm and short, directing attention to healthy topics,  or take breaks when needed.    Jamarquis was engaged in discussion and was polite and cooperative. He was motivated for treatment as evidenced by his transparency and eagerness to utilize coping skills and improve his symptoms. Patient is to use CBT, mindfulness and coping skills to help manage decrease symptoms associated with their diagnosis. Reduce overall level, frequency, and intensity of the feelings of depression, anxiety and panic evidenced by decreased irritability, negative self talk, and helpless feelings from 6 to 7 days/week to 0 to 1 days/week per client report for at least 3 consecutive months. Treatment planning to be reviewed by 05/02/2025.   Interventions: Cognitive Behavioral Therapy, Dialectical Behavioral Therapy, Assertiveness/Communication, Mindfulness Meditation, Motivational Interviewing, and Solution-Oriented/Positive Psychology   Diagnosis: PTSD, bipolar II disorder, Borderline Personality Disorder    [name] MSW, LCSW/DATE 12/17/025

## 2024-10-01 NOTE — Progress Notes (Signed)
 Troy [name]                                          MRN: [id number]   10/01/2024   The [facility] Quality Team Specialist reviewed this patient medical record for the purposes of chart review for care gap closure. The following were reviewed: chart review for care gap closure-diabetic eye exam, glycemic status assessment, and kidney health evaluation for diabetes:eGFR  and uACR.    [facility] Quality Team

## 2024-10-07 ENCOUNTER — Other Ambulatory Visit: Payer: Self-pay | Admitting: Internal Medicine

## 2024-10-10 ENCOUNTER — Ambulatory Visit: Admitting: Licensed Clinical Social Worker

## 2024-10-10 DIAGNOSIS — F4310 Post-traumatic stress disorder, unspecified: Secondary | ICD-10-CM | POA: Diagnosis not present

## 2024-10-10 DIAGNOSIS — F603 Borderline personality disorder: Secondary | ICD-10-CM

## 2024-10-10 DIAGNOSIS — F3181 Bipolar II disorder: Secondary | ICD-10-CM

## 2024-10-11 NOTE — Progress Notes (Unsigned)
 [facility] Counselor/Therapist Progress Note  Patient ID: Troy [name], MRN: [id number]    Date: 10/10/2024  Time Spent: 0102  pm - 0200 pm : 58 Minutes  Treatment Type: Individual Therapy.  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 Patient reports both parents are ill.    Mental Status Exam: Appearance:  Casual     Behavior: Appropriate  Motor: Normal  Speech/Language:  Clear and Coherent  Affect: Appropriate  Mood: normal  Thought process: normal  Thought content:   WNL  Sensory/Perceptual disturbances:   WNL  Orientation: oriented to person, place, time/date, situation, day of week, month of year, and year  Attention: Good  Concentration: Good  Memory: WNL  Fund of knowledge:  Good  Insight:   Good  Judgment:  Good  Impulse Control: Good    Risk Assessment: Danger to Self:  No Self-injurious Behavior: No Danger to Others: No Duty to Warn:no Physical Aggression / Violence:No  Access to Firearms a concern: No  Gang Involvement:No    Subjective:    Troy [name] participated in person from office, located at [employer]. Troy [name] consented to treatment. Therapist participated from office located at [facility]   Troy [name] presented for his session just returning from the gym. Troy [name] reports that the gym has became his life since his recovery and his strokes. He reports that it helps him mentally just as much as it does physically. He report having a good support system there and enjoys going. Troy [name] states that he had Christmas eve with his parents and spent Christmas at home watching football. He reports that he did not see any of his siblings during the holidays nor did he speak to any of them. He states that his mother is planning a party  for his father's birthday and she made a comment that it wasn't fair how patients siblings are treating him. Troy [name] seems to think that his mother is going to speak to them. Patient is clearly bothered by the lack of contact with his siblings. He reports that he got clean and did all the things he had to do, in order to improve his life, but they can't see him beyond that person in active addiction. Troy [name] shared some back history of his addiction and road to recovery and was very transparent as he identified the hurt he caused.   Clinician actively listened and processed with patient his concerns. Clinician encouraged patient to stay strong and continue down the path he has been going and doing what he needs to do in order to remain clean. Clinician praised patient for how he cares for his parents and makes it a point to see them almost daily. Clinician processed with patient the importance of focusing on self-worth and self-care, building a strong external support network (friends, mentors, therapists) for validation, establishing healthy boundaries, and practicing self-compassion (positive self-talk, journaling) to nurture himself and to , recognize his value isn't tied to their approval.   Troy [name] was engaged in discussion and was polite and cooperative. He was motivated for treatment as evidenced by his transparency and eagerness to utilize coping skills and improve his symptoms. Patient is to use CBT, mindfulness and coping skills to help manage decrease symptoms associated with their diagnosis. Reduce overall level, frequency, and intensity  of the feelings of depression, anxiety and panic evidenced by decreased irritability, negative self talk, and helpless feelings from 6 to 7 days/week to 0 to 1 days/week per client report for at least 3 consecutive months. Treatment planning to be reviewed by 05/02/2025.   Interventions: Cognitive Behavioral Therapy, Dialectical Behavioral Therapy,  Assertiveness/Communication, Mindfulness Meditation, Motivational Interviewing, and Solution-Oriented/Positive Psychology   Diagnosis: PTSD, bipolar II disorder, Borderline Personality Disorder     [name] MSW, LCSW/DATE 10/10/2024

## 2024-10-17 ENCOUNTER — Ambulatory Visit (INDEPENDENT_AMBULATORY_CARE_PROVIDER_SITE_OTHER): Admitting: Dermatology

## 2024-10-17 ENCOUNTER — Encounter: Payer: Self-pay | Admitting: Dermatology

## 2024-10-17 VITALS — BP 131/80 | HR 89

## 2024-10-17 DIAGNOSIS — D1801 Hemangioma of skin and subcutaneous tissue: Secondary | ICD-10-CM

## 2024-10-17 DIAGNOSIS — Z85828 Personal history of other malignant neoplasm of skin: Secondary | ICD-10-CM

## 2024-10-17 DIAGNOSIS — W908XXA Exposure to other nonionizing radiation, initial encounter: Secondary | ICD-10-CM | POA: Diagnosis not present

## 2024-10-17 DIAGNOSIS — D229 Melanocytic nevi, unspecified: Secondary | ICD-10-CM

## 2024-10-17 DIAGNOSIS — Z1283 Encounter for screening for malignant neoplasm of skin: Secondary | ICD-10-CM | POA: Diagnosis not present

## 2024-10-17 DIAGNOSIS — D485 Neoplasm of uncertain behavior of skin: Secondary | ICD-10-CM | POA: Diagnosis not present

## 2024-10-17 DIAGNOSIS — C44519 Basal cell carcinoma of skin of other part of trunk: Secondary | ICD-10-CM

## 2024-10-17 DIAGNOSIS — L821 Other seborrheic keratosis: Secondary | ICD-10-CM | POA: Diagnosis not present

## 2024-10-17 DIAGNOSIS — L814 Other melanin hyperpigmentation: Secondary | ICD-10-CM

## 2024-10-17 DIAGNOSIS — L578 Other skin changes due to chronic exposure to nonionizing radiation: Secondary | ICD-10-CM

## 2024-10-17 NOTE — Patient Instructions (Signed)
Patient Handout: Wound Care for Skin Biopsy Site  Taking Care of Your Skin Biopsy Site  Proper care of the biopsy site is essential for promoting healing and minimizing scarring. This handout provides instructions on how to care for your biopsy site to ensure optimal recovery.  1. Cleaning the Wound:  Clean the biopsy site daily with gentle soap and water. Gently pat the area dry with a clean, soft towel. Avoid harsh scrubbing or rubbing the area, as this can irritate the skin and delay healing. 2. Applying Aquaphor and Bandage:  After cleaning the wound, apply a thin layer of Aquaphor ointment to the biopsy site. Cover the area with a sterile bandage to protect it from dirt, bacteria, and friction. Change the bandage daily or as needed if it becomes soiled or wet. 3. Continued Care for One Week:  Repeat the cleaning, Aquaphor application, and bandaging process daily for one week following the biopsy procedure. Keeping the wound clean and moist during this initial healing period will help prevent infection and promote optimal healing. 4. Massaging Aquaphor into the Area:  After one week, discontinue the use of bandages but continue to apply Aquaphor to the biopsy site. Gently massage the Aquaphor into the area using circular motions. Massaging the skin helps to promote circulation and prevent the formation of scar tissue. Additional Tips:  Avoid exposing the biopsy site to direct sunlight during the healing process, as this can cause hyperpigmentation or worsen scarring. If you experience any signs of infection, such as increased redness, swelling, warmth, or drainage from the wound, contact your healthcare provider immediately. Follow any additional instructions provided by your healthcare provider for caring for the biopsy site and managing any discomfort. Conclusion:  Taking proper care of your skin biopsy site is crucial for ensuring optimal healing and minimizing scarring. By  following these instructions for cleaning, applying Aquaphor, and massaging the area, you can promote a smooth and successful recovery. If you have any questions or concerns about caring for your biopsy site, don't hesitate to contact your healthcare provider for guidance.  

## 2024-10-17 NOTE — Progress Notes (Signed)
 "  Total Body Skin Exam (TBSE) Visit   Subjective  Troy [name] is a 49 y.o. male who presents for the following: Skin Cancer Screening and Full Body Skin Exam  Patient presents today for follow up visit for TBSE. Patient's last TBSE was completed on 05/24/2023. Patient denies medication changes. Patient reports he  does have spots, moles and lesions of concern to be evaluated (Left Axilla). Patient reports throughout his lifetime he has had moderate sun exposure. Currently, patient reports if he  has excessive sun exposure, he  does not apply sunscreen and/or wears protective coverings. Patient reports he has hx of bx (Patient has Hx of BCC- Right Posterior Shoulder MOHS completed on 09/06/2023 & several other BCC). He states the reason for all of his previous skin cancers are because of radiation therapy as a child. Patient reports  family history of skin cancers. The patient has spots, moles and lesions to be evaluated, some may be new or changing and the patient has concerns that these could be cancer.  The following portions of the chart were reviewed this encounter and updated as appropriate: medications, allergies, medical history  Review of Systems:  No other skin or systemic complaints except as noted in HPI or Assessment and Plan.  Objective  Well appearing patient in no apparent distress; mood and affect are within normal limits.  A full examination was performed including scalp, head, eyes, ears, nose, lips, neck, chest, axillae, abdomen, back, buttocks, bilateral upper extremities, bilateral lower extremities, hands, feet, fingers, toes, fingernails, and toenails. All findings within normal limits unless otherwise noted below.   Relevant physical exam findings are noted in the Assessment and Plan.                Left Lower Back 6 mm pink pearly papule Left Axilla 7 mm pink pearly papule  Assessment & Plan   LENTIGINES, SEBORRHEIC KERATOSES, HEMANGIOMAS - Benign normal  skin lesions - Benign-appearing - Call for any changes  MELANOCYTIC NEVI - Tan-brown and/or pink-flesh-colored symmetric macules and papules - Benign appearing on exam today - Observation - Call clinic for new or changing moles - Recommend daily use of broad spectrum spf 30+ sunscreen to sun-exposed areas.   ACTINIC DAMAGE - Chronic condition, secondary to cumulative UV/sun exposure - diffuse scaly erythematous macules with underlying dyspigmentation - Recommend daily broad spectrum sunscreen SPF 30+ to sun-exposed areas, reapply every 2 hours as needed.  - Staying in the shade or wearing long sleeves, sun glasses (UVA+UVB protection) and wide brim hats (4-inch brim around the entire circumference of the hat) are also recommended for sun protection.  - Call for new or changing lesions.  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  - Call if any new or changing lesions are noted between office visits  SKIN CANCER SCREENING PERFORMED TODAY. NEOPLASM OF UNCERTAIN BEHAVIOR OF SKIN (2) Left Lower Back - Epidermal / dermal shaving  Lesion diameter (cm):  0.6 Informed consent: discussed and consent obtained   Timeout: patient name, date of birth, surgical site, and procedure verified   Procedure prep:  Patient was prepped and draped in usual sterile fashion Prep type:  Isopropyl alcohol Anesthesia: the lesion was anesthetized in a standard fashion   Anesthetic:  1% lidocaine  w/ epinephrine  1-100,000 buffered w/  8.4% NaHCO3 Instrument used: DermaBlade   Hemostasis achieved with: aluminum chloride   Outcome: patient tolerated procedure well   Post-procedure details: sterile dressing applied and wound care instructions given   Dressing type: petrolatum   Additional details:  Pt  aware that benign results will be sent to mychart and the staff will call abnormal results will  Specimen A - Surgical pathology Differential Diagnosis: R/O Pigmented BCC  Check Margins: No Left Axilla - Epidermal / dermal shaving  Lesion diameter (cm):  0.7 Informed consent: discussed and consent obtained   Timeout: patient name, date of birth, surgical site, and procedure verified   Procedure prep:  Patient was prepped and draped in usual sterile fashion Prep type:  Isopropyl alcohol Anesthesia: the lesion was anesthetized in a standard fashion   Anesthetic:  1% lidocaine  w/ epinephrine  1-100,000 buffered w/ 8.4% NaHCO3 Instrument used: DermaBlade   Hemostasis achieved with: aluminum chloride   Outcome: patient tolerated procedure well   Post-procedure details: sterile dressing applied and wound care instructions given   Dressing type: petrolatum   Additional details:  Pt aware that benign results will be sent to mychart and the staff will call abnormal results will  Specimen B - Surgical pathology Differential Diagnosis: R/O SCC  Check Margins: No  No follow-ups on file.  I, Jetta Ager, am acting as neurosurgeon for [employer], DO.  Documentation: I have reviewed the above documentation for accuracy and completeness, and I agree with the above.  Delon Lenis, DO   "

## 2024-10-18 LAB — SURGICAL PATHOLOGY

## 2024-10-23 ENCOUNTER — Ambulatory Visit: Payer: Self-pay | Admitting: Dermatology

## 2024-10-23 ENCOUNTER — Ambulatory Visit: Admitting: Family Medicine

## 2024-10-23 VITALS — BP 142/97 | HR 101 | Temp 98.0°F | Resp 16 | Ht 72.0 in | Wt 203.4 lb

## 2024-10-23 DIAGNOSIS — C4491 Basal cell carcinoma of skin, unspecified: Secondary | ICD-10-CM

## 2024-10-23 DIAGNOSIS — J4540 Moderate persistent asthma, uncomplicated: Secondary | ICD-10-CM

## 2024-10-23 DIAGNOSIS — J8410 Pulmonary fibrosis, unspecified: Secondary | ICD-10-CM

## 2024-10-23 DIAGNOSIS — J4541 Moderate persistent asthma with (acute) exacerbation: Secondary | ICD-10-CM | POA: Diagnosis not present

## 2024-10-23 HISTORY — DX: Basal cell carcinoma of skin, unspecified: C44.91

## 2024-10-23 MED ORDER — HYDROCODONE BIT-HOMATROP MBR 5-1.5 MG/5ML PO SOLN: 5.0000 mL | Freq: Three times a day (TID) | ORAL | 0 refills | Status: AC | PRN

## 2024-10-23 MED ORDER — IPRATROPIUM-ALBUTEROL 0.5-2.5 (3) MG/3ML IN SOLN
3.0000 mL | Freq: Once | RESPIRATORY_TRACT | Status: AC
  Administered 2024-10-23: 3 mL via RESPIRATORY_TRACT

## 2024-10-23 MED ORDER — DOXYCYCLINE HYCLATE 100 MG PO TABS: 100.0000 mg | ORAL_TABLET | Freq: Two times a day (BID) | ORAL | 0 refills | Status: AC

## 2024-10-23 MED ORDER — METHYLPREDNISOLONE SODIUM SUCC 125 MG IJ SOLR
125.0000 mg | Freq: Once | INTRAMUSCULAR | Status: AC
  Administered 2024-10-23: 125 mg via INTRAVENOUS

## 2024-10-23 MED ORDER — METHYLPREDNISOLONE SODIUM SUCC 40 MG IJ SOLR: 40.0000 mg | Freq: Once | INTRAMUSCULAR | Status: DC

## 2024-10-23 MED ORDER — IPRATROPIUM-ALBUTEROL 0.5-2.5 (3) MG/3ML IN SOLN: 3.0000 mL | Freq: Once | RESPIRATORY_TRACT | Status: DC

## 2024-10-23 MED ORDER — PREDNISONE 50 MG PO TABS: ORAL_TABLET | ORAL | 0 refills | Status: AC

## 2024-10-23 NOTE — Progress Notes (Signed)
 Shirron,  Please call pt and notify their bx results were positive for a skin CA that will be excised by Dr. Corey  1. Skin, left lower back :       BASAL CELL CARCINOMA, SUPERFICIAL AND NODULAR PATTERNS        2. Skin, left axilla :       SEBORRHEIC KERATOSIS, IRRITATED

## 2024-10-23 NOTE — Progress Notes (Signed)
 " Assessment & Plan   Assessment/Plan:   Assessment and Plan Assessment & Plan Asthma exacerbation with pulmonary fibrosis Acute asthma exacerbation with significant wheezing and shortness of breath, likely exacerbated by recent upper respiratory symptoms. Pulmonary fibrosis complicates the clinical picture. Oxygen saturation was 94% at home, indicating mild hypoxemia. He has not been using his nebulizer regimen as prescribed, contributing to the exacerbation. He has a history of adverse reactions to long-term prednisone  use but tolerates short courses well, especially when combined with doxycycline  to prevent rash. - Administered DuoNeb (albuterol  and ipratropium) in the clinic. - Administered methylprednisolone  injection for immediate relief. - Prescribed prednisone  50 mg daily for 4 days, with tapering if needed. - Prescribed doxycycline  100 mg daily for 7 days. - Prescribed hydrocodone  cough syrup for cough management. - Continue albuterol  as needed. - Follow up with pulmonology as needed.        There are no discontinued medications.  Return if symptoms worsen or fail to improve.        Subjective:   Encounter date: 10/23/2024  Troy [name] is a 49 y.o. male who has History of stroke; Moderate persistent asthma, uncomplicated; History of Hodgkin's lymphoma; Chronic rhinitis; Not currently working due to disabled status; Pain in joint of left knee; Chronic insomnia; Arthralgia of left elbow; Autonomic dysfunction; History of basal cell carcinoma (BCC); Daytime somnolence; GERD (gastroesophageal reflux disease); Essential hypertension; History of thyroid  cancer; Iron deficiency anemia due to chronic blood loss; Large liver; Microalbuminuria; Prediabetes; History of splenectomy; Postoperative hypothyroidism; Erectile dysfunction; Male hypogonadism; Pulmonary fibrosis (HCC); Peripheral neuropathy; History of hepatitis C; Back pain; Hyperlipidemia; 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  ; Exercise induced anaphylaxis; Osteoarthritis of acromioclavicular joint; History of squamous cell carcinoma in situ (SCCIS) of skin; Rotator cuff tear, right; Lower urinary tract symptoms (LUTS); Chondromalacia; Erythrocytosis; SVT (supraventricular tachycardia); Aortic atherosclerosis; HFrEF (heart failure with reduced ejection fraction) (HCC); Blood pressure alteration; Moderate persistent asthma with exacerbation; Polycythemia, secondary; Scrotal abscess; Rupture of anterior cruciate ligament of both knees; PTSD (post-traumatic stress disorder); Bipolar II disorder (HCC); Borderline personality disorder (HCC); Prolapsed internal hemorrhoids, grade 2; and BCC (basal cell carcinoma of skin) on their problem list..   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  (07/16/2021), CHF (congestive heart failure) (HCC), Chronic fatigue, Depression, Drug abuse (HCC), Ectopic atrial rhythm, Family history of adverse reaction to anesthesia, Family history of breast cancer (05/19/2021), Family history of colon cancer in mother, GERD (gastroesophageal reflux disease), Hematochezia, Hepatic steatosis, History of CVA (cerebrovascular accident), History of hepatitis C, History of hiatal hernia, History of Hodgkin's lymphoma, History of splenectomy, Hodgkin's lymphoma (HCC), Hyperlipidemia, Hypertension, Insomnia, Male hypogonadism, Moderate persistent asthma, Obesity, Class I, BMI 30-34.9, Papillary thyroid  carcinoma (HCC) (2010), Pneumonia, Postsurgical hypothyroidism, Prediabetes, Pulmonary fibrosis (HCC), Sleep apnea, Stroke (HCC), and Thyroid  ca (HCC).SABRA   He presents with chief complaint of Acute Visit (Patient presents today for scratchy throat, cough, congestion, diarrhea since 10/19/24 Friday. Reports mother had infection and was taking antibiotics. He reports  taking sudafed and mucinex.) .   Discussed the use of AI scribe software for clinical note transcription with the patient, who gave verbal consent to proceed.  History of Present Illness Troy [name] is a 49 year old male with severe asthma and pulmonary fibrosis who presents with five days of scratchy throat, cough, congestion, and diarrhea.  Upper  respiratory symptoms - Scratchy throat, cough, and congestion for five days - Symptoms began with scratchy throat while at the gym - Cough worsened by Friday night and Saturday, becoming severe - No fever  Gastrointestinal symptoms - Diarrhea with a couple of loose stools on Monday - Attributes loose stools to dietary changes over the weekend  Dyspnea and cough in the setting of severe asthma and pulmonary fibrosis - History of severe asthma and pulmonary fibrosis with chronic cough and shortness of breath - Worsening shortness of breath, particularly at night - Significant coughing on Sunday caused physical pain - Oxygen saturation at home as low as 94% - Uses Trelegy and budesonide  regularly - Mixes budesonide  with levalbuterol  in a nebulizer when ill, but has not used the nebulizer recently  Medication use and allergies - Currently taking Sudafed and Mucinex for symptoms - Has not used DuoNebs at home and is unfamiliar with ipratropium - Allergy to prednisone : develops rash if taken for more than five days - Can tolerate a five-day course of prednisone  if taken with doxycycline , which prevents rash - Previous adverse reactions to high-dose prednisone  over extended periods  Functional status - On disability following two strokes in 2016 - Does not require a work note     ROS  Past Surgical History:  Procedure Laterality Date   COLONOSCOPY  07/04/2019   done for hematochezia and FH col ca-mother: Mod int hem w/inflamm.  No adenomatous polyps.   HEMORRHOID BANDING  07/2024   INCISIONAL HERNIA REPAIR  06/2019   RUQ (prev  lap append site)   KNEE ARTHROSCOPY WITH MENISCAL REPAIR Right 12/23/2023   Procedure: KNEE ARTHROSCOPY WITH POSSIBLE LATERAL  MENISCAL REPAIR;  Surgeon: Sharl Selinda Dover, MD;  Location: [facility];  Service: Orthopedics;  Laterality: Right;   LAPAROSCOPIC APPENDECTOMY N/A 03/25/2017   Procedure: APPENDECTOMY LAPAROSCOPIC; EXTENSIVE LYSIS OF ADHESIONS FROM PRIOR RADIATION AND SURGERY;  Surgeon: Gladis Cough, MD;  Location: [facility];  Service: General;  Laterality: N/A;   MENISCUS REPAIR Left 09/16/2023   Procedure: REPAIR OF LATERAL MENISCUS;  Surgeon: Sharl Selinda Dover, MD;  Location: [facility];  Service: Orthopedics;  Laterality: Left;  120 block in preop   PARATHYROIDECTOMY     SPLENECTOMY     TEE WITHOUT CARDIOVERSION N/A 07/18/2015   Procedure: TRANSESOPHAGEAL ECHOCARDIOGRAM (TEE);  Surgeon: Aleene JINNY Passe, MD;  Location: [facility];  Service: Cardiovascular;  Laterality: N/A;   THYROIDECTOMY      Outpatient Medications Prior to Visit  Medication Sig Dispense Refill   ammonium lactate (LAC-HYDRIN) 12 % lotion Apply 1 application topically as needed for dry skin.     aspirin  325 MG tablet Take 1 tablet (325 mg total) by mouth daily. 30 tablet 0   Azelastine  HCl 137 MCG/SPRAY SOLN Place 2 sprays into the nose 2 (two) times daily as needed. 30 mL 5   baclofen  (LIORESAL ) 10 MG tablet Take 1 tablet by mouth three times daily as needed 30 tablet 11   bisoprolol  (ZEBETA ) 5 MG tablet TAKE 1 TABLET BY MOUTH AT BEDTIME 90 tablet 1   budesonide  (PULMICORT ) 0.5 MG/2ML nebulizer solution Mix with Xopenex  nebulizer solution every 6 hours as needed. 120 mL 2   clindamycin  (CLEOCIN  T) 1 % lotion Apply topically as needed (rash). 60 mL 6   ENTRESTO  49-51 MG Take 1 tablet by mouth twice daily 60 tablet 0   EPINEPHrine  0.3 mg/0.3 mL IJ SOAJ injection Inject 0.3 mg into the muscle as needed for  anaphylaxis. 2 each 2   esomeprazole  (NEXIUM ) 40 MG capsule TAKE 1 CAPSULE BY MOUTH  TWICE DAILY BEFORE A MEAL 180 capsule 3   Fluticasone -Umeclidin-Vilant (TRELEGY ELLIPTA ) 200-62.5-25 MCG/ACT AEPB INHALE 1 PUFF ONCE DAILY 60 each 3   furosemide  (LASIX ) 20 MG tablet Take 1 tablet (20 mg total) by mouth 3 (three) times a week. 36 tablet 0   gabapentin  (NEURONTIN ) 300 MG capsule TAKE 2 CAPSULES BY MOUTH IN THE MORNING AND 2 IN THE AFTERNOON AND 4 CAPSULES AT BEDTIME 240 capsule 3   hydrocortisone  (ANUSOL -HC) 25 MG suppository Place 1 suppository (25 mg total) rectally 2 (two) times daily. 12 suppository 0   hydrocortisone  (ANUSOL -HC) 25 MG suppository Place 1 suppository (25 mg total) rectally at bedtime. 10 suppository 0   hydrOXYzine  (VISTARIL ) 25 MG capsule TAKE 1 CAPSULE BY MOUTH EVERY 8 HOURS AS NEEDED 30 capsule 5   levalbuterol  (XOPENEX  HFA) 45 MCG/ACT inhaler INHALE 2 PUFFS BY MOUTH EVERY 4 HOURS AS NEEDED FOR WHEEZING 30 g 1   levalbuterol  (XOPENEX ) 1.25 MG/3ML nebulizer solution Take 1.25 mg by nebulization every 4 (four) hours as needed for wheezing. 120 mL 1   levothyroxine  (SYNTHROID ) 300 MCG tablet Take 300 mcg by mouth daily before breakfast.     LUER LOCK SAFETY SYRINGES 22G X 1-1/2 3 ML MISC USE FOR SELF ADMINISTER OF TESTOSTERONE  2CC ONCE EVERY 2 WEEKS     Melatonin 10 MG TABS Take by mouth at bedtime.     Multiple Vitamin (MULTI-VITAMIN) tablet Take 1 tablet by mouth daily.     ondansetron  (ZOFRAN ) 4 MG tablet Take 1 tablet (4 mg total) by mouth every 8 (eight) hours as needed for vomiting or nausea. 20 tablet 0   REPATHA  SURECLICK 140 MG/ML SOAJ INJECT 140 MG INTO THE SKIN EVERY 14 DAYS 6 mL 0   risperiDONE  (RISPERDAL ) 1 MG tablet Take 1 tablet (1 mg total) by mouth at bedtime. 30 tablet 2   rosuvastatin  (CRESTOR ) 40 MG tablet Take 1 tablet by mouth once daily 90 tablet 2   Suvorexant  (BELSOMRA ) 10 MG TABS TAKE 1 TABLET BY MOUTH AT BEDTIME 30 tablet 5   Suvorexant  (BELSOMRA ) 15 MG TABS TAKE 1 TABLET BY MOUTH AT BEDTIME 30 tablet 5   tamsulosin  (FLOMAX ) 0.4 MG  CAPS capsule Take 1 capsule by mouth once daily 90 capsule 3   testosterone  cypionate (DEPOTESTOSTERONE CYPIONATE) 200 MG/ML injection Inject 0.6 mLs (120 mg total) into the muscle once a week. 10 mL 0   traZODone  (DESYREL ) 150 MG tablet TAKE 2 TABLETS BY MOUTH AT BEDTIME 180 tablet 3   triamcinolone  (NASACORT ALLERGY 24HR) 55 MCG/ACT AERO nasal inhaler Place 2 sprays into the nose daily.     No facility-administered medications prior to visit.    Family History  Problem Relation Age of Onset   Breast cancer Mother        dx early 68s   Colon cancer Mother        dx early 39s   Other Mother        NTHL1 homozygous   Sleep apnea Mother    Parkinsonism Father    Multiple myeloma Father        dx 60s   Sleep apnea Father    Breast cancer Maternal Aunt    Stroke Maternal Grandmother    Leukemia Other        dx <20; two paternal first cousins once removed    Social History   Socioeconomic History  Marital status: Single    Spouse name: Not on file   Number of children: 0   Years of education: Not on file   Highest education level: Not on file  Occupational History   Occupation: nurse  Tobacco Use   Smoking status: Former    Current packs/day: 0.00    Types: Cigarettes    Quit date: 2013    Years since quitting: 13.0    Passive exposure: Never   Smokeless tobacco: Never  Vaping Use   Vaping status: Some Days   Substances: Flavoring  Substance and Sexual Activity   Alcohol use: Not Currently    Comment: occasionally   Drug use: Yes    Types: Marijuana    Comment: pt states I'm uncomfortable answering this question   Sexual activity: Yes  Other Topics Concern   Not on file  Social History Narrative   Are you right handed or left handed? Right   Are you currently employed ? No   What is your current occupation? None   Do you live at home alone? yes   Who lives with you? no   What type of home do you live in: 1 story or 2 story? 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 : Never true  Transportation Needs: No Transportation Needs (10/24/2023)   Received from [facility]    In the past 12 months, has lack of reliable transportation kept you from medical appointments, meetings, work or from getting things needed for daily living? : No  Physical Activity: Not on file  Stress: Not on file  Social Connections: Not on file  Intimate Partner Violence: Not on file  Depression (PHQ2-9): Medium Risk (09/26/2024)   Depression (PHQ2-9)    PHQ-2 Score: 6  Alcohol Screen: Not on file  Housing: Low Risk (10/24/2023)   Received from [facility]   Epic    What is your living situation today?: I have a steady place to live    Think about the place you live. Do you have problems with any of the following? Choose all that apply:: Not on file  Utilities: Low Risk (10/24/2023)   Received from [facility]   Utilities    In the past 12 months has the electric, gas, oil, or water company threatened to shut off services in your home? : No  Health Literacy: Not on file  Objective:  Physical Exam: BP (!) 142/97   Pulse (!) 101   Temp 98 F (36.7 C)   Resp 16   Ht 6' (1.829 m)   Wt 203 lb 6.4 oz (92.3 kg)   SpO2 98%   BMI 27.59 kg/m    Physical  Exam VITALS: BP- 142/97, SaO2- 94% GENERAL: Alert, cooperative, well developed, no acute distress. HEENT: Normocephalic, normal oropharynx, moist mucous membranes. CHEST: Wheezing present on auscultation, no increased work of breathing. CARDIOVASCULAR: Normal heart rate and rhythm, S1 and S2 normal without murmurs. ABDOMEN: Soft, non-tender, non-distended, without organomegaly, normal bowel sounds. EXTREMITIES: No cyanosis or edema. NEUROLOGICAL: Cranial nerves grossly intact, moves all extremities without gross motor or sensory deficit.   Physical Exam  No results found.  Recent Results (from the past 2160 hours)  CBC w/Diff     Status: Abnormal   Collection Time: 08/03/24  2:49 PM  Result Value Ref Range   WBC 10.7 (H) 4.0 - 10.5 K/uL   RBC 4.19 (L) 4.22 - 5.81 Mil/uL   Hemoglobin 13.9 13.0 - 17.0 g/dL   HCT 57.6 60.9 - 47.9 %   MCV 100.9 (H) 78.0 - 100.0 fl   MCHC 33.0 30.0 - 36.0 g/dL   RDW 84.3 (H) 88.4 - 84.4 %   Platelets 285.0 150.0 - 400.0 K/uL   Neutrophils Relative % 55.6 43.0 - 77.0 %   Lymphocytes Relative 24.9 12.0 - 46.0 %   Monocytes Relative 13.8 (H) 3.0 - 12.0 %   Eosinophils Relative 4.8 0.0 - 5.0 %   Basophils Relative 0.9 0.0 - 3.0 %   Neutro Abs 6.0 1.4 - 7.7 K/uL   Lymphs Abs 2.7 0.7 - 4.0 K/uL   Monocytes Absolute 1.5 (H) 0.1 - 1.0 K/uL   Eosinophils Absolute 0.5 0.0 - 0.7 K/uL   Basophils Absolute 0.1 0.0 - 0.1 K/uL  Ferritin     Status: None   Collection Time: 08/08/24  3:06 PM  Result Value Ref Range   Ferritin 27 24 - 336 ng/mL    Comment: Performed at [facility], [address], [place], [place] [address]  CBC with Differential (Cancer Center Only)     Status: Abnormal   Collection Time: 08/08/24  3:06 PM  Result Value Ref Range   WBC Count 11.3 (H) 4.0 - 10.5 K/uL   RBC 3.83 (L) 4.22 - 5.81 MIL/uL   Hemoglobin 12.5 (L) 13.0 - 17.0 g/dL   HCT 63.7 (L) 60.9 - 47.9 %   MCV 94.5 80.0 - 100.0 fL   MCH 32.6 26.0 - 34.0 pg    MCHC 34.5 30.0 - 36.0 g/dL   RDW 82.8 (H) 88.4 - 84.4 %   Platelet Count 336 150 - 400 K/uL   nRBC 0.0 0.0 - 0.2 %   Neutrophils Relative % 58 %   Neutro Abs 6.6 1.7 - 7.7 K/uL   Lymphocytes Relative 24 %   Lymphs Abs 2.7 0.7 - 4.0 K/uL   Monocytes Relative 11 %   Monocytes Absolute 1.3 (H) 0.1 - 1.0 K/uL   Eosinophils Relative 5 %   Eosinophils Absolute 0.6 (H) 0.0 - 0.5 K/uL   Basophils Relative 1 %   Basophils Absolute 0.1 0.0 - 0.1 K/uL   Immature Granulocytes 1 %   Abs Immature Granulocytes 0.07 0.00 - 0.07 K/uL    Comment: Performed at [facility], [address] [place], [place] [address]  CBC  Status: Abnormal   Collection Time: 08/23/24  2:33 PM  Result Value Ref Range   WBC 8.7 4.0 - 10.5 K/uL   RBC 3.97 (L) 4.22 - 5.81 Mil/uL   Platelets 355.0 150.0 - 400.0 K/uL   Hemoglobin 13.0 13.0 - 17.0 g/dL   HCT 60.5 60.9 - 47.9 %   MCV 99.4 78.0 - 100.0 fl   MCHC 33.0 30.0 - 36.0 g/dL   RDW 83.0 (H) 88.4 - 84.4 %  CBC     Status: Abnormal   Collection Time: 09/26/24  3:23 PM  Result Value Ref Range   WBC 7.9 4.0 - 10.5 K/uL   RBC 4.61 4.22 - 5.81 Mil/uL   Platelets 255.0 150.0 - 400.0 K/uL   Hemoglobin 15.1 13.0 - 17.0 g/dL   HCT 54.3 60.9 - 47.9 %   MCV 99.0 78.0 - 100.0 fl   MCHC 33.0 30.0 - 36.0 g/dL   RDW 83.2 (H) 88.4 - 84.4 %  Surgical pathology     Status: None   Collection Time: 10/17/24 12:00 AM  Result Value Ref Range   SURGICAL PATHOLOGY      SURGICAL PATHOLOGY [facility] [address], [address] [place], [place] [address] Telephone [phone] or [phone] Fax [phone]  REPORT OF DERMATOPATHOLOGY   Accession #: [id number] Patient Name: WAGNER, TANZI Visit # : [id number]  MRN: [id number] Cytotechnologist: Munoz-Bishop Md, Eleanor, Dermatopathologist, Electronic Signature DOB/Age October 02, 1976 (Age: 70) Gender: M Collected Date: 10/17/2024 Received Date: 10/17/2024  FINAL DIAGNOSIS        1. Skin, left lower back :       BASAL CELL CARCINOMA, SUPERFICIAL AND NODULAR PATTERNS       2. Skin, left axilla :       SEBORRHEIC KERATOSIS, IRRITATED       ELECTRONIC SIGNATURE : Munoz-Bishop Md, Melissa, Dermatopathologist, Electronic Signature  MICROSCOPIC DESCRIPTION 1. There is a proliferation of atypical basaloid cells forming nodules and small aggregates infiltrating the dermis.There is palisading of nuclei at the periphery of these aggregates. 2. The epidermis exhibits  hyperkeratosis, acanthosis, elongation of rete ridges and papillomatosis.Some of the rete are interconnected.Cells in some areas have squamoid rather than basaloid features.The pattern is that of irritated seborrheic keratosis.Focally, there are some features, which resemble those seen in a verruca.  CASE COMMENTS STAINS USED IN DIAGNOSIS: H&E H&E    CLINICAL HISTORY  SPECIMEN(S) OBTAINED 1. Skin, Left Lower Back 2. Skin, Left Axilla  SPECIMEN COMMENTS: 1. 6 mm pink pearly papule 2. 7 mm pink pearly papule SPECIMEN CLINICAL INFORMATION: 1. Neoplasm of uncertain behavior of skin, R/O pigmented BCC 2. Neoplasm of uncertain behavior of skin, R/O SCC    Gross Description 1. Formalin fixed specimen received:  9 X 7 X 1 MM, TOTO (3 P) (1 B) ( mm ) 2. Formalin fixed specimen received:  6 X 6 X 1 MM, TOTO (2 P) (1 B) ( mm )        Report signed out from the following location(s) [place] [address] [place], [place] [address] CL IA #: [id number]  [facility] [address] [place], [place] [address] CLIA #: [id number]         Beverley Adine Hummer, MD, MS "

## 2024-10-23 NOTE — Addendum Note (Signed)
 Addended by: Branston Halsted C on: 10/23/2024 01:54 PM   Modules accepted: Orders

## 2024-10-24 ENCOUNTER — Ambulatory Visit: Admitting: Licensed Clinical Social Worker

## 2024-10-24 DIAGNOSIS — F602 Antisocial personality disorder: Secondary | ICD-10-CM

## 2024-10-24 DIAGNOSIS — F6081 Narcissistic personality disorder: Secondary | ICD-10-CM

## 2024-10-24 DIAGNOSIS — F4310 Post-traumatic stress disorder, unspecified: Secondary | ICD-10-CM | POA: Diagnosis not present

## 2024-10-24 DIAGNOSIS — F3181 Bipolar II disorder: Secondary | ICD-10-CM

## 2024-10-24 DIAGNOSIS — F603 Borderline personality disorder: Secondary | ICD-10-CM | POA: Diagnosis not present

## 2024-10-24 NOTE — Progress Notes (Signed)
 [facility] Counselor/Therapist Progress Note  Patient ID: Troy [name], MRN: [id number]    Date: 10/24/2024  Time Spent: 104  pm - 0200 pm : 56 Minutes  Treatment Type: Individual Therapy.  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 Patient reports both parents are ill.    Mental Status Exam: Appearance:  Casual     Behavior: Appropriate  Motor: Normal  Speech/Language:  Clear and Coherent  Affect: Appropriate  Mood: normal  Thought process: normal  Thought content:   WNL  Sensory/Perceptual disturbances:   WNL  Orientation: oriented to person, place, time/date, situation, day of week, month of year, and year  Attention: Good  Concentration: Good  Memory: WNL  Fund of knowledge:  Good  Insight:   Good  Judgment:  Good  Impulse Control: Good    Risk Assessment: Danger to Self:  No Self-injurious Behavior: No Danger to Others: No Duty to Warn:no Physical Aggression / Violence:No  Access to Firearms a concern: No  Gang Involvement:No    Subjective:    Troy [name] participated in person from office, located at [employer]. Troy [name] consented to treatment. Therapist participated from office located at [facility].   Patient presented for his session stating that he has been sick. He reports that he broke up with the girl he had been dating. That it was for the best. He reports that the birthday party for his Dad and he was kind of disappointed due to his father not being allowed to be around people. Patient also reports that his mother stated she had a conversation with his brother but made no progress toward re connecting the relationship with patient.  Clinician actively listened and processed with patient his concerns. Clinician encouraged  patient to stay strong and continue down the path he has been going and doing what he needs to do in order to remain clean. Clinician praised patient for how he cares for his parents and makes it a point to see them almost daily. Clinician processed with patient the importance of focusing on self-worth and self-care, building a strong external support network (friends, mentors, therapists) for validation, establishing healthy boundaries, and practicing self-compassion (positive self-talk, journaling) to nurture himself and to , recognize his value isn't tied to their approval.    Troy [name] was engaged in discussion and was polite and cooperative. He was motivated for treatment as evidenced by his transparency and eagerness to utilize coping skills and improve his symptoms. Patient is to use CBT, mindfulness and coping skills to help manage decrease symptoms associated with their diagnosis. Reduce overall level, frequency, and intensity of the feelings of depression, anxiety and panic evidenced by decreased irritability, negative self talk, and helpless feelings from 6 to 7 days/week to 0 to 1 days/week per client report for at least 3 consecutive months. Treatment planning to be reviewed by 05/02/2025.   Interventions: Cognitive Behavioral Therapy, Dialectical Behavioral Therapy, Assertiveness/Communication, Mindfulness Meditation, Motivational Interviewing, and Solution-Oriented/Positive Psychology   Diagnosis: PTSD, bipolar II disorder, Borderline Personality Disorder      [name] MSW, LCSW/DATE 10/24/2024

## 2024-10-27 ENCOUNTER — Other Ambulatory Visit: Payer: Self-pay | Admitting: Family Medicine

## 2024-10-27 DIAGNOSIS — G63 Polyneuropathy in diseases classified elsewhere: Secondary | ICD-10-CM

## 2024-10-29 ENCOUNTER — Encounter: Payer: Self-pay | Admitting: Internal Medicine

## 2024-10-29 ENCOUNTER — Ambulatory Visit: Admitting: Internal Medicine

## 2024-10-29 VITALS — BP 136/84 | HR 96 | Ht 72.0 in | Wt 206.0 lb

## 2024-10-29 DIAGNOSIS — K641 Second degree hemorrhoids: Secondary | ICD-10-CM | POA: Diagnosis not present

## 2024-10-29 NOTE — Progress Notes (Signed)
"      ° °  HEMORRHOID LIGATION  INDICATION: Rectal bleeding-she has had some recurrence since last visit of 08/23/2024 though not as severe as prior.  He is using stool softener.  He continues to lift weights which is acceptable.  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  Patient returns for follow-up 08/23/2024 and declined repeat banding as he was not having bleeding.  Rectal exam reveals normal anoderm, digital exam some palpable internal hemorrhoids mostly in the right posterior and left lateral areas.  Nontender.  Anoscopy is performed and demonstrates grade 2 right posterior and left lateral internal hemorrhoids and a grade 1 right anterior.  The right posterior has some stigmata of bleeding.  PROCEDURE NOTE: The patient presents with symptomatic grade 2  hemorrhoids, requesting rubber band ligation of his/her hemorrhoidal disease.  All risks, benefits and alternative forms of therapy were described and informed consent was obtained.   The anorectum was pre-medicated with 5% lidocaine  topical The decision was made to band the right posterior internal hemorrhoid, and the [facility] ORegan System was used to perform band ligation without complication.  Digital anorectal examination was then performed to assure proper positioning of the band, and to adjust the banded tissue as required.  The patient was discharged home without pain or other issues.  Dietary and behavioral recommendations were given and along with follow-up instructions.     The following adjunctive treatments were recommended:  Stool softeners  The patient will return 12/14/2024  for  follow-up and possible additional banding as required. No complications were encountered and the patient tolerated the procedure well.    "

## 2024-10-29 NOTE — Patient Instructions (Signed)
 HEMORRHOID BANDING PROCEDURE    FOLLOW-UP CARE   The procedure you have had should have been relatively painless since the banding of the area involved does not have nerve endings and there is no pain sensation.  The rubber band cuts off the blood supply to the hemorrhoid and the band may fall off as soon as 48 hours after the banding (the band may occasionally be seen in the toilet bowl following a bowel movement). You may notice a temporary feeling of fullness in the rectum which should respond adequately to plain Tylenol  or Motrin.  Following the banding, avoid strenuous exercise that evening and resume full activity the next day.  A sitz bath (soaking in a warm tub) or bidet is soothing, and can be useful for cleansing the area after bowel movements.     To avoid constipation, take two tablespoons of natural wheat bran, natural oat bran, flax, Benefiber or any over the counter fiber supplement and increase your water intake to 7-8 glasses daily.    Unless you have been prescribed anorectal medication, do not put anything inside your rectum for two weeks: No suppositories, enemas, fingers, etc.  Occasionally, you may have more bleeding than usual after the banding procedure.  This is often from the untreated hemorrhoids rather than the treated one.  Dont be concerned if there is a tablespoon or so of blood.  If there is more blood than this, lie flat with your bottom higher than your head and apply an ice pack to the area. If the bleeding does not stop within a half an hour or if you feel faint, call our office at [phone] or go to the emergency room.  Problems are not common; however, if there is a substantial amount of bleeding, severe pain, chills, fever or difficulty passing urine (very rare) or other problems, you should call us  at [phone] or report to the nearest emergency room.  Do not stay seated continuously for more than 2-3 hours for a day or two after the procedure.   Tighten your buttock muscles 10-15 times every two hours and take 10-15 deep breaths every 1-2 hours.  Do not spend more than a few minutes on the toilet if you cannot empty your bowel; instead re-visit the toilet at a later time.   _______________________________________________________  If your blood pressure at your visit was 140/90 or greater, please contact your primary care physician to follow up on this.  _______________________________________________________  If you are age 52 or older, your body mass index should be between 23-30. Your Body mass index is 27.94 kg/m. If this is out of the aforementioned range listed, please consider follow up with your Primary Care Provider.  If you are age 41 or younger, your body mass index should be between 19-25. Your Body mass index is 27.94 kg/m. If this is out of the aformentioned range listed, please consider follow up with your Primary Care Provider.   ________________________________________________________  The [facility] providers would like to encourage you to use MYCHART to communicate with providers for non-urgent requests or questions.  Due to long hold times on the telephone, sending your provider a message by [employer] [facility] may be a faster and more efficient way to get a response.  Please allow 48 business hours for a response.  Please remember that this is for non-urgent requests.  _______________________________________________________  [facility] is using a team-based approach to care.  Your team is made up of your doctor and two  to three APPS. Our APPS (Nurse Practitioners and Physician Assistants) work with your physician to ensure care continuity for you. They are fully qualified to address your health concerns and develop a treatment plan. They communicate directly with your gastroenterologist to care for you. Seeing the Advanced Practice Practitioners on your physician's team can help you by facilitating care more  promptly, often allowing for earlier appointments, access to diagnostic testing, procedures, and other specialty referrals.

## 2024-10-31 ENCOUNTER — Other Ambulatory Visit

## 2024-10-31 DIAGNOSIS — Z006 Encounter for examination for normal comparison and control in clinical research program: Secondary | ICD-10-CM

## 2024-11-05 NOTE — Progress Notes (Signed)
 Troy [name]                                          MRN: [id number]   11/05/2024   The [facility] Quality Team Specialist reviewed this patient medical record for the purposes of chart review for care gap closure. The following were reviewed: chart review for care gap closure-glycemic status assessment.    [facility] Quality Team

## 2024-11-07 ENCOUNTER — Other Ambulatory Visit: Payer: Self-pay | Admitting: Internal Medicine

## 2024-11-07 ENCOUNTER — Ambulatory Visit: Admitting: Licensed Clinical Social Worker

## 2024-11-07 DIAGNOSIS — F4310 Post-traumatic stress disorder, unspecified: Secondary | ICD-10-CM | POA: Diagnosis not present

## 2024-11-07 DIAGNOSIS — F3181 Bipolar II disorder: Secondary | ICD-10-CM | POA: Diagnosis not present
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
## 2024-11-09 ENCOUNTER — Encounter: Payer: Self-pay | Admitting: *Deleted

## 2024-11-09 NOTE — Progress Notes (Signed)
 Troy [name]                                          MRN: [id number]   11/09/2024   The [facility] Quality Team Specialist reviewed this patient medical record for the purposes of chart review for care gap closure. The following were reviewed: chart review for care gap closure-glycemic status assessment.    [facility] Quality Team

## 2024-11-14 ENCOUNTER — Encounter: Payer: Self-pay | Admitting: Adult Health

## 2024-11-14 ENCOUNTER — Telehealth: Admitting: Adult Health

## 2024-11-14 DIAGNOSIS — F4310 Post-traumatic stress disorder, unspecified: Secondary | ICD-10-CM

## 2024-11-14 DIAGNOSIS — F6081 Narcissistic personality disorder: Secondary | ICD-10-CM | POA: Diagnosis not present

## 2024-11-14 DIAGNOSIS — F603 Borderline personality disorder: Secondary | ICD-10-CM | POA: Diagnosis not present

## 2024-11-14 DIAGNOSIS — F602 Antisocial personality disorder: Secondary | ICD-10-CM

## 2024-11-14 DIAGNOSIS — F3181 Bipolar II disorder: Secondary | ICD-10-CM | POA: Diagnosis not present

## 2024-11-14 LAB — GENECONNECT MOLECULAR SCREEN: Genetic Analysis Overall Interpretation: NEGATIVE

## 2024-11-14 NOTE — Progress Notes (Signed)
 Troy [name] [id number] 05-16-1976 49 y.o.  Virtual Visit via Video Note  I connected with pt @ on 11/14/24 at 12:00 PM EST by a video enabled telemedicine application and verified that I am speaking with the correct person using two identifiers.   I discussed the limitations of evaluation and management by telemedicine and the availability of in person appointments. The patient expressed understanding and agreed to proceed.  I discussed the assessment and treatment plan with the patient. The patient was provided an opportunity to ask questions and all were answered. The patient agreed with the plan and demonstrated an understanding of the instructions.   The patient was advised to call back or seek an in-person evaluation if the symptoms worsen or if the condition fails to improve as anticipated.  I provided 25 minutes of non-face-to-face time during this encounter.  The patient was located at home.  The provider was located at [facility].   Angeline LOISE Sayers, NP   Subjective:   Patient ID:  Troy [name] is a 49 y.o. (DOB 01-20-1976) male.  Chief Complaint: No chief complaint on file.   HPI Troy [name] presents for follow-up of BPD, BPD2, PTSD, antisocial personality disorder, narcisscitic PD.  Describes mood today as ok. Pleasant. Denies tearfulness. Mood symptoms - denies depression. Reports stable interest and motivation for the most part. Reports some anxiety about financial situation. Reports irritability at times - it depends on the day. Denies recent panic attacks. Reports over thinking and rumination - that keeps me up at night. Denies worry. Denies obsessions. Reports  mood swings - they still exist. Stating I feel like I'm doing pretty good. Reports taking current medications as prescribed. Energy levels lower. Active, has a regular exercise routine. Enjoys some usual interests and activities. Single. Lives alone in an apartment. Parents local.  Has 2 sibling - estranged. Spending time with family. Appetite adequate. Weight loss 12 pounds while sick in January. Reports difficulties getting to and staying asleep. Averages 6 to 7 hours. Not using CPAP machine consistently. Reports focus and concentration not that great, but normal. Completing tasks. Managing aspects of household. Reports he is permanently and totally disabled - strokes. Denies SI.  Denies HI.  Denies AH or VH. Denies self harm. Reports substance use - smoking THC. 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 Denies alcohol use.  Previous medication trials:  Trazadone, Gabapentin , Depakote, Wellbutrin , Zoloft , Sonata , Seroquel    Review of Systems:  Review of Systems  Musculoskeletal:  Negative for gait problem.  Neurological:  Negative for tremors.  Psychiatric/Behavioral:         Please refer to HPI    Medications: I have reviewed the patient's current medications.  Current Outpatient Medications  Medication Sig Dispense Refill   ammonium lactate (LAC-HYDRIN) 12 % lotion Apply 1 application topically as needed for dry skin.     aspirin  325 MG tablet Take 1 tablet (325 mg total) by mouth daily. 30 tablet 0   Azelastine  HCl 137 MCG/SPRAY SOLN Place 2 sprays into the nose 2 (two) times daily as needed. 30 mL 5   baclofen  (LIORESAL ) 10 MG tablet Take 1 tablet by mouth three times daily as needed 30 tablet 11   bisoprolol  (ZEBETA ) 5 MG tablet TAKE 1 TABLET BY MOUTH AT BEDTIME 90 tablet 1   budesonide  (PULMICORT ) 0.5 MG/2ML nebulizer solution Mix with Xopenex  nebulizer solution every 6 hours as needed. 120 mL 2   clindamycin  (CLEOCIN  T) 1 %  lotion Apply topically as needed (rash). 60 mL 6   ENTRESTO  49-51 MG Take 1 tablet by mouth twice daily 60 tablet 0   EPINEPHrine  0.3 mg/0.3 mL IJ SOAJ injection Inject 0.3 mg into the muscle as needed for anaphylaxis. 2 each 2   esomeprazole  (NEXIUM ) 40 MG capsule TAKE 1 CAPSULE BY MOUTH TWICE DAILY BEFORE A MEAL 180  capsule 3   Fluticasone -Umeclidin-Vilant (TRELEGY ELLIPTA ) 200-62.5-25 MCG/ACT AEPB INHALE 1 PUFF ONCE DAILY 60 each 3   furosemide  (LASIX ) 20 MG tablet Take 1 tablet (20 mg total) by mouth 3 (three) times a week. 36 tablet 0   gabapentin  (NEURONTIN ) 300 MG capsule TAKE 2 CAPSULES BY MOUTH IN THE MORNING AND 2 IN THE  AFTERNOON AND 4 AT BEDTIME 240 capsule 3   HYDROcodone  bit-homatropine (HYCODAN) 5-1.5 MG/5ML syrup Take 5 mLs by mouth every 8 (eight) hours as needed for cough. 120 mL 0   hydrocortisone  (ANUSOL -HC) 25 MG suppository Place 1 suppository (25 mg total) rectally 2 (two) times daily. 12 suppository 0   hydrocortisone  (ANUSOL -HC) 25 MG suppository Place 1 suppository (25 mg total) rectally at bedtime. 10 suppository 0   hydrOXYzine  (VISTARIL ) 25 MG capsule TAKE 1 CAPSULE BY MOUTH EVERY 8 HOURS AS NEEDED 30 capsule 5   levalbuterol  (XOPENEX  HFA) 45 MCG/ACT inhaler INHALE 2 PUFFS BY MOUTH EVERY 4 HOURS AS NEEDED FOR WHEEZING 30 g 1   levalbuterol  (XOPENEX ) 1.25 MG/3ML nebulizer solution Take 1.25 mg by nebulization every 4 (four) hours as needed for wheezing. 120 mL 1   levothyroxine  (SYNTHROID ) 300 MCG tablet Take 300 mcg by mouth daily before breakfast.     LUER LOCK SAFETY SYRINGES 22G X 1-1/2 3 ML MISC USE FOR SELF ADMINISTER OF TESTOSTERONE  2CC ONCE EVERY 2 WEEKS     Melatonin 10 MG TABS Take by mouth at bedtime.     Multiple Vitamin (MULTI-VITAMIN) tablet Take 1 tablet by mouth daily.     ondansetron  (ZOFRAN ) 4 MG tablet Take 1 tablet (4 mg total) by mouth every 8 (eight) hours as needed for vomiting or nausea. 20 tablet 0   predniSONE  (DELTASONE ) 50 MG tablet Take 1 tablet daily for 5 days. 4 tablet 0   REPATHA  SURECLICK 140 MG/ML SOAJ INJECT 140 MG INTO THE SKIN EVERY 14 DAYS 6 mL 0   risperiDONE  (RISPERDAL ) 1 MG tablet Take 1 tablet (1 mg total) by mouth at bedtime. 30 tablet 2   rosuvastatin  (CRESTOR ) 40 MG tablet Take 1 tablet by mouth once daily 90 tablet 2   Suvorexant   (BELSOMRA ) 10 MG TABS TAKE 1 TABLET BY MOUTH AT BEDTIME 30 tablet 5   Suvorexant  (BELSOMRA ) 15 MG TABS TAKE 1 TABLET BY MOUTH AT BEDTIME 30 tablet 5   tamsulosin  (FLOMAX ) 0.4 MG CAPS capsule Take 1 capsule by mouth once daily 90 capsule 3   testosterone  cypionate (DEPOTESTOSTERONE CYPIONATE) 200 MG/ML injection Inject 0.6 mLs (120 mg total) into the muscle once a week. 10 mL 0   traZODone  (DESYREL ) 150 MG tablet TAKE 2 TABLETS BY MOUTH AT BEDTIME 180 tablet 3   triamcinolone  (NASACORT ALLERGY 24HR) 55 MCG/ACT AERO nasal inhaler Place 2 sprays into the nose daily.     No current facility-administered medications for this visit.    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   CHF (congestive heart failure) (HCC)    Chronic fatigue    Depression    Drug abuse (HCC)    Ectopic atrial rhythm    Family history of adverse reaction to anesthesia    Dad  has problem with lots of medications   Family history of breast cancer 05/19/2021   Family history of colon cancer in mother    GERD (gastroesophageal reflux disease)    +Barretts   Hematochezia    hemorrhoids   Hepatic steatosis    +hepatomegaly   History of CVA (cerebrovascular accident)    radiation-induced carotid damage/changes   History of hepatitis C    +treated   History of hiatal hernia    History of Hodgkin's lymphoma    age 27->mantle RT + splenectomy   History of splenectomy    Hodgkin's lymphoma (HCC)    Hyperlipidemia    Hypertension    Insomnia    Male hypogonadism    Moderate persistent asthma    Obesity, Class I, BMI 30-34.9    Papillary thyroid  carcinoma (HCC) 2010   total thyroidectomy + neck dissection   Pneumonia     Postsurgical hypothyroidism    Prediabetes    Pulmonary fibrosis (HCC)    ?rad induced?   Sleep apnea    mild  cpap   Stroke [facility])    x2  2016  october no defecits memory affected   Thyroid  [facility])     Family History  Problem Relation Age of Onset   Breast cancer Mother        dx early 56s   Colon cancer Mother        dx early 31s   Other Mother        NTHL1 homozygous   Sleep apnea Mother    Parkinsonism Father    Multiple myeloma Father        dx 44s   Sleep apnea Father    Breast cancer Maternal Aunt    Stroke Maternal Grandmother    Leukemia Other        dx <20; two paternal first cousins once removed    Social History   Socioeconomic History   Marital status: Single    Spouse name: Not on file   Number of children: 0   Years of education: Not on file   Highest education level: Not on file  Occupational History   Occupation: nurse  Tobacco Use   Smoking status: Former    Current packs/day: 0.00    Types: Cigarettes    Quit date: 2013    Years since quitting: 13.1    Passive exposure: Never   Smokeless tobacco: Never  Vaping Use   Vaping status: Some Days   Substances: Flavoring  Substance and Sexual Activity   Alcohol use: Not Currently    Comment: occasionally   Drug use: Yes    Types: Marijuana    Comment: pt states I'm uncomfortable answering this question   Sexual activity: Yes  Other Topics Concern   Not on file  Social History Narrative   Are you right handed or left handed? Right   Are you currently employed ? No   What is your current occupation? None   Do you live at home alone? yes   Who lives with  you? no   What type of home do you live in: 1 story or 2 story? Third floor apratment      10 years I had a heroin addiction, and sometimes I need more medicines         Disablity          Social Drivers of Health   Tobacco Use: Medium Risk (10/29/2024)   Patient History    Smoking Tobacco Use: Former    Smokeless Tobacco  Use: Never    Passive Exposure: Never  Physicist, Medical Strain: Not on file  Food Insecurity: Low Risk (10/24/2023)   Received from [facility]   Epic    Within the past 12 months, you worried that your food would run out before you got money to buy more: Never true    Within the past 12 months, the food you bought just didn't last and you didn't have money to get more. : Never true  Transportation Needs: No Transportation Needs (10/24/2023)   Received from [facility]    In the past 12 months, has lack of reliable transportation kept you from medical appointments, meetings, work or from getting things needed for daily living? : No  Physical Activity: Not on file  Stress: Not on file  Social Connections: Not on file  Intimate Partner Violence: Not on file  Depression (PHQ2-9): Medium Risk (09/26/2024)   Depression (PHQ2-9)    PHQ-2 Score: 6  Alcohol Screen: Not on file  Housing: Low Risk (10/24/2023)   Received from [facility]   Epic    What is your living situation today?: I have a steady place to live    Think about the place you live. Do you have problems with any of the following? Choose all that apply:: Not on file  Utilities: Low Risk (10/24/2023)   Received from [facility]   Utilities    In the past 12 months has the electric, gas, oil, or water company threatened to shut off services in your home? : No  Health Literacy: Not on file    Past Medical History, Surgical history, Social history, and Family history were reviewed and updated as appropriate.   Please see review of systems for further details on the patient's review from today.   Objective:   Physical Exam:  There were no vitals taken for this visit.  Physical Exam Constitutional:      General: He is not in acute distress. Musculoskeletal:        General: No deformity.  Neurological:     Mental Status: He is alert and oriented to person, place, and time.     Coordination:  Coordination normal.  Psychiatric:        Attention and Perception: Attention and perception normal. He does not perceive auditory or visual hallucinations.        Mood and Affect: Mood normal. Mood is not anxious or depressed. Affect is not labile, blunt, angry or inappropriate.        Speech: Speech normal.        Behavior: Behavior normal.        Thought Content: Thought content normal. Thought content is not paranoid or delusional. Thought content does not include homicidal or suicidal ideation. Thought content does not include homicidal or suicidal plan.        Cognition and Memory: Cognition and memory normal.        Judgment: Judgment normal.     Comments: Insight intact  Lab Review:     Component Value Date/Time   NA 136 12/14/2023 1503   NA 147 (H) 09/12/2023 1026   NA 134 (L) 12/10/2013 2132   K 4.7 12/14/2023 1503   K 4.5 12/10/2013 2132   CL 104 12/14/2023 1503   CL 104 12/10/2013 2132   CO2 22 12/14/2023 1503   CO2 26 12/10/2013 2132   GLUCOSE 92 12/14/2023 1503   GLUCOSE 86 12/10/2013 2132   BUN 16 12/14/2023 1503   BUN 19 09/12/2023 1026   BUN 15 12/10/2013 2132   CREATININE 0.92 12/14/2023 1503   CREATININE 1.14 12/10/2013 2132   CALCIUM  8.3 (L) 12/14/2023 1503   CALCIUM  8.2 (L) 12/10/2013 2132   PROT 7.0 09/12/2023 1026   PROT 7.8 12/10/2013 2132   ALBUMIN 4.3 09/12/2023 1026   ALBUMIN 3.5 12/10/2013 2132   AST 67 (H) 09/12/2023 1026   AST 30 12/10/2013 2132   ALT 107 (H) 09/12/2023 1026   ALT 32 12/10/2013 2132   ALKPHOS 169 (H) 09/12/2023 1026   ALKPHOS 47 12/10/2013 2132   BILITOT 0.5 09/12/2023 1026   BILITOT 0.3 12/10/2013 2132   GFRNONAA >60 12/14/2023 1503   GFRNONAA >60 12/10/2013 2132   GFRAA >60 12/19/2018 1152   GFRAA >60 12/10/2013 2132       Component Value Date/Time   WBC 7.9 09/26/2024 1523   RBC 4.61 09/26/2024 1523   HGB 15.1 09/26/2024 1523   HGB 12.5 (L) 08/08/2024 1506   HGB 17.3 09/05/2023 1017   HCT 45.6 09/26/2024  1523   HCT 49.9 09/05/2023 1017   PLT 255.0 09/26/2024 1523   PLT 336 08/08/2024 1506   PLT 227 09/05/2023 1017   MCV 99.0 09/26/2024 1523   MCV 96 09/05/2023 1017   MCV 93 12/10/2013 2132   MCH 32.6 08/08/2024 1506   MCHC 33.0 09/26/2024 1523   RDW 16.7 (H) 09/26/2024 1523   RDW 17.2 (H) 09/05/2023 1017   RDW 16.0 (H) 12/10/2013 2132   LYMPHSABS 2.7 08/08/2024 1506   LYMPHSABS 3.3 (H) 08/12/2020 1436   MONOABS 1.3 (H) 08/08/2024 1506   EOSABS 0.6 (H) 08/08/2024 1506   EOSABS 0.5 (H) 08/12/2020 1436   BASOSABS 0.1 08/08/2024 1506   BASOSABS 0.1 08/12/2020 1436    No results found for: POCLITH, LITHIUM   No results found for: PHENYTOIN, PHENOBARB, VALPROATE, CBMZ   .res Assessment: Plan:    Plan:  PDMP reviewed  Risperdal  1mg  at bedtime  RTC 3 months  Discussed potential metabolic side effects associated with atypical antipsychotics, as well as potential risk for movement side effects. Advised pt to contact office if movement side effects occur.    25 minutes spent dedicated to the care of this patient on the date of this encounter to include pre-visit review of records, ordering of medication, post visit documentation, and face-to-face time with the patient discussing BPD, BPD2, PTSD, antisocial personality disorder, narcisscitic PD. Discussed continuing Risperdal  1mg  at bedtime.   Patient advised to contact office with any questions, adverse effects, or acute worsening in signs and symptoms.  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  Future Appointments  Date Time Provider Department Center  11/21/2024  1:00 PM Schuyler Damien HERO [facility] [id number]  11/27/2024 11:15 AM  Iva Marty Saltness, MD [facility] None  12/05/2024  1:00 PM Schuyler Damien HERO [facility] [id number]  12/12/2024  2:15 PM Whitfield Raisin, NP [facility] None  12/14/2024  2:50 PM Avram Lupita BRAVO, MD [facility]  12/19/2024  1:00 PM Schuyler Damien HERO [facility] [id number]  01/02/2025  1:00 PM Schuyler Damien HERO [facility] [id number]  01/23/2025  1:20 PM Thedora Garnette HERO, MD [facility] [place] [facility]  02/06/2025 11:15 AM Corey Rufus HERO, MD [facility] None    No orders of the defined types were placed in this encounter.     -------------------------------     [1]  Allergies Allergen Reactions   Ciprofloxacin Other (See Comments)    Spontaneous rupture of tendon   Emelia Sprawls Officinalis] Anaphylaxis and Hives   Dapagliflozin  Nausea And Vomiting    abscess  Patient refuses to give further information.   Fluocinolone Other (See Comments)    spontaneous rupture of tendons per pt dr.   Gisele  Rash

## 2024-11-21 ENCOUNTER — Ambulatory Visit: Admitting: Adult Health

## 2024-11-21 ENCOUNTER — Ambulatory Visit: Admitting: Licensed Clinical Social Worker

## 2024-11-27 ENCOUNTER — Ambulatory Visit: Admitting: Allergy & Immunology

## 2024-12-05 ENCOUNTER — Ambulatory Visit: Admitting: Licensed Clinical Social Worker

## 2024-12-12 ENCOUNTER — Ambulatory Visit: Admitting: Adult Health

## 2024-12-14 ENCOUNTER — Encounter: Admitting: Internal Medicine

## 2024-12-19 ENCOUNTER — Ambulatory Visit: Admitting: Licensed Clinical Social Worker

## 2025-01-02 ENCOUNTER — Ambulatory Visit: Admitting: Licensed Clinical Social Worker

## 2025-01-23 ENCOUNTER — Ambulatory Visit: Admitting: Family Medicine

## 2025-02-06 ENCOUNTER — Encounter: Admitting: Dermatology

## 2025-02-20 ENCOUNTER — Telehealth: Admitting: Adult Health
